# Patient Record
Sex: Female | Born: 1945 | Race: White | Hispanic: No | Marital: Single | Smoking: Current some day smoker
Health system: Southern US, Community
[De-identification: ages and names within clinical notes are randomized; demographics above are authoritative.]

## PROBLEM LIST (undated history)

## (undated) DIAGNOSIS — N289 Disorder of kidney and ureter, unspecified: Secondary | ICD-10-CM

## (undated) DIAGNOSIS — I4891 Unspecified atrial fibrillation: Secondary | ICD-10-CM

## (undated) DIAGNOSIS — T753XXA Motion sickness, initial encounter: Secondary | ICD-10-CM

## (undated) DIAGNOSIS — F0390 Unspecified dementia without behavioral disturbance: Secondary | ICD-10-CM

## (undated) DIAGNOSIS — F32A Depression, unspecified: Secondary | ICD-10-CM

## (undated) DIAGNOSIS — N179 Acute kidney failure, unspecified: Secondary | ICD-10-CM

## (undated) DIAGNOSIS — F329 Major depressive disorder, single episode, unspecified: Secondary | ICD-10-CM

## (undated) DIAGNOSIS — E7800 Pure hypercholesterolemia, unspecified: Secondary | ICD-10-CM

## (undated) DIAGNOSIS — F319 Bipolar disorder, unspecified: Secondary | ICD-10-CM

## (undated) DIAGNOSIS — R55 Syncope and collapse: Secondary | ICD-10-CM

## (undated) DIAGNOSIS — F419 Anxiety disorder, unspecified: Secondary | ICD-10-CM

## (undated) DIAGNOSIS — E119 Type 2 diabetes mellitus without complications: Secondary | ICD-10-CM

## (undated) DIAGNOSIS — R251 Tremor, unspecified: Secondary | ICD-10-CM

## (undated) DIAGNOSIS — G7000 Myasthenia gravis without (acute) exacerbation: Secondary | ICD-10-CM

## (undated) DIAGNOSIS — C439 Malignant melanoma of skin, unspecified: Secondary | ICD-10-CM

## (undated) DIAGNOSIS — J449 Chronic obstructive pulmonary disease, unspecified: Secondary | ICD-10-CM

## (undated) DIAGNOSIS — I519 Heart disease, unspecified: Secondary | ICD-10-CM

## (undated) DIAGNOSIS — G43909 Migraine, unspecified, not intractable, without status migrainosus: Secondary | ICD-10-CM

## (undated) DIAGNOSIS — I10 Essential (primary) hypertension: Secondary | ICD-10-CM

## (undated) DIAGNOSIS — M359 Systemic involvement of connective tissue, unspecified: Secondary | ICD-10-CM

## (undated) HISTORY — DX: Acute kidney failure, unspecified: N17.9

## (undated) HISTORY — PX: CHOLECYSTECTOMY: SHX55

## (undated) HISTORY — DX: Disorder of kidney and ureter, unspecified: N28.9

## (undated) HISTORY — DX: Unspecified atrial fibrillation: I48.91

## (undated) HISTORY — PX: OTHER SURGICAL HISTORY: SHX169

## (undated) HISTORY — DX: Anxiety disorder, unspecified: F41.9

## (undated) HISTORY — PX: CATARACT EXTRACTION: SUR2

## (undated) HISTORY — DX: Pure hypercholesterolemia, unspecified: E78.00

## (undated) HISTORY — DX: Unspecified dementia, unspecified severity, without behavioral disturbance, psychotic disturbance, mood disturbance, and anxiety: F03.90

## (undated) HISTORY — DX: Essential (primary) hypertension: I10

## (undated) HISTORY — DX: Major depressive disorder, single episode, unspecified: F32.9

## (undated) HISTORY — DX: Type 2 diabetes mellitus without complications: E11.9

## (undated) HISTORY — DX: Migraine, unspecified, not intractable, without status migrainosus: G43.909

## (undated) HISTORY — DX: Heart disease, unspecified: I51.9

## (undated) HISTORY — DX: Depression, unspecified: F32.A

## (undated) HISTORY — DX: Systemic involvement of connective tissue, unspecified: M35.9

## (undated) HISTORY — DX: Syncope and collapse: R55

## (undated) HISTORY — DX: Myasthenia gravis without (acute) exacerbation: G70.00

## (undated) HISTORY — DX: Malignant melanoma of skin, unspecified: C43.9

## (undated) HISTORY — PX: TUBAL LIGATION: SHX77

## (undated) HISTORY — DX: Bipolar disorder, unspecified: F31.9

## (undated) HISTORY — DX: Motion sickness, initial encounter: T75.3XXA

## (undated) HISTORY — DX: Tremor, unspecified: R25.1

## (undated) HISTORY — DX: Chronic obstructive pulmonary disease, unspecified: J44.9

---

## 2012-08-12 LAB — PULMONARY FUNCTION TEST

## 2013-07-31 ENCOUNTER — Emergency Department: Payer: Self-pay | Admitting: Emergency Medicine

## 2013-07-31 LAB — CBC
HGB: 13.2 g/dL (ref 12.0–16.0)
MCH: 30.4 pg (ref 26.0–34.0)
MCHC: 33.4 g/dL (ref 32.0–36.0)
RBC: 4.35 10*6/uL (ref 3.80–5.20)
RDW: 14.1 % (ref 11.5–14.5)
WBC: 8.9 10*3/uL (ref 3.6–11.0)

## 2013-07-31 LAB — COMPREHENSIVE METABOLIC PANEL
Albumin: 3.6 g/dL (ref 3.4–5.0)
Anion Gap: 4 — ABNORMAL LOW (ref 7–16)
BUN: 19 mg/dL — ABNORMAL HIGH (ref 7–18)
Calcium, Total: 9.9 mg/dL (ref 8.5–10.1)
Chloride: 112 mmol/L — ABNORMAL HIGH (ref 98–107)
Co2: 25 mmol/L (ref 21–32)
Creatinine: 1.15 mg/dL (ref 0.60–1.30)
Glucose: 80 mg/dL (ref 65–99)
Osmolality: 282 (ref 275–301)
Potassium: 3.4 mmol/L — ABNORMAL LOW (ref 3.5–5.1)
SGPT (ALT): 20 U/L (ref 12–78)
Total Protein: 6.5 g/dL (ref 6.4–8.2)

## 2013-07-31 LAB — URINALYSIS, COMPLETE
Bacteria: NONE SEEN
Bilirubin,UR: NEGATIVE
Blood: NEGATIVE
Hyaline Cast: 2
Ketone: NEGATIVE
Leukocyte Esterase: NEGATIVE
Ph: 5 (ref 4.5–8.0)
RBC,UR: 1 /HPF (ref 0–5)
Specific Gravity: 1.02 (ref 1.003–1.030)
WBC UR: 1 /HPF (ref 0–5)

## 2013-07-31 LAB — LIPASE, BLOOD: Lipase: 272 U/L (ref 73–393)

## 2013-07-31 LAB — TROPONIN I: Troponin-I: <0.02 ng/mL

## 2013-08-13 LAB — DRUG SCREEN, URINE
Amphetamines, Ur Screen: NEGATIVE (ref ?–1000)
Benzodiazepine, Ur Scrn: NEGATIVE (ref ?–200)
Cannabinoid 50 Ng, Ur ~~LOC~~: NEGATIVE (ref ?–50)
Cocaine Metabolite,Ur ~~LOC~~: NEGATIVE (ref ?–300)
MDMA (Ecstasy)Ur Screen: NEGATIVE (ref ?–500)
Phencyclidine (PCP) Ur S: NEGATIVE (ref ?–25)
Tricyclic, Ur Screen: NEGATIVE (ref ?–1000)

## 2013-08-13 LAB — URINALYSIS, COMPLETE
Bacteria: NONE SEEN
Bilirubin,UR: NEGATIVE
Blood: NEGATIVE
Glucose,UR: 150 mg/dL (ref 0–75)
Ketone: NEGATIVE
Nitrite: NEGATIVE
Ph: 6 (ref 4.5–8.0)
Squamous Epithelial: 3

## 2013-08-13 LAB — COMPREHENSIVE METABOLIC PANEL
Anion Gap: 10 (ref 7–16)
BUN: 16 mg/dL (ref 7–18)
Bilirubin,Total: 0.4 mg/dL (ref 0.2–1.0)
Chloride: 107 mmol/L (ref 98–107)
Co2: 23 mmol/L (ref 21–32)
Creatinine: 1.09 mg/dL (ref 0.60–1.30)
EGFR (Non-African Amer.): 52 — ABNORMAL LOW
Osmolality: 290 (ref 275–301)
Potassium: 3.7 mmol/L (ref 3.5–5.1)
SGOT(AST): 21 U/L (ref 15–37)
SGPT (ALT): 30 U/L (ref 12–78)
Total Protein: 6.2 g/dL — ABNORMAL LOW (ref 6.4–8.2)

## 2013-08-13 LAB — CBC
HGB: 13.3 g/dL (ref 12.0–16.0)
MCH: 29.8 pg (ref 26.0–34.0)
MCHC: 32.5 g/dL (ref 32.0–36.0)
RBC: 4.46 10*6/uL (ref 3.80–5.20)
RDW: 14.1 % (ref 11.5–14.5)
WBC: 10.4 10*3/uL (ref 3.6–11.0)

## 2013-08-13 LAB — ETHANOL
Ethanol %: <0.003 % (ref 0.000–0.080)
Ethanol: <3 mg/dL

## 2013-08-13 LAB — SALICYLATE LEVEL: Salicylates, Serum: <1.7 mg/dL

## 2013-08-13 LAB — TSH: Thyroid Stimulating Horm: 0.38 u[IU]/mL — ABNORMAL LOW

## 2013-08-14 ENCOUNTER — Inpatient Hospital Stay: Payer: Self-pay | Admitting: Psychiatry

## 2013-08-21 LAB — LIPID PANEL
HDL Cholesterol: 32 mg/dL — ABNORMAL LOW (ref 40–60)
Ldl Cholesterol, Calc: 129 mg/dL — ABNORMAL HIGH (ref 0–100)
Triglycerides: 199 mg/dL (ref 0–200)

## 2013-08-21 LAB — HEMOGLOBIN A1C: Hemoglobin A1C: 6.1 % (ref 4.2–6.3)

## 2013-10-30 ENCOUNTER — Emergency Department: Payer: Self-pay | Admitting: Emergency Medicine

## 2013-10-30 LAB — CBC WITH DIFFERENTIAL/PLATELET
BASOS PCT: 0.6 %
Basophil #: 0 10*3/uL (ref 0.0–0.1)
EOS ABS: 0.1 10*3/uL (ref 0.0–0.7)
EOS PCT: 0.9 %
HCT: 35.9 % (ref 35.0–47.0)
HGB: 12.1 g/dL (ref 12.0–16.0)
Lymphocyte #: 0.8 10*3/uL — ABNORMAL LOW (ref 1.0–3.6)
Lymphocyte %: 10.9 %
MCH: 31.8 pg (ref 26.0–34.0)
MCHC: 33.6 g/dL (ref 32.0–36.0)
MCV: 95 fL (ref 80–100)
MONOS PCT: 6.3 %
Monocyte #: 0.5 x10 3/mm (ref 0.2–0.9)
Neutrophil #: 6.3 10*3/uL (ref 1.4–6.5)
Neutrophil %: 81.3 %
Platelet: 200 10*3/uL (ref 150–440)
RBC: 3.8 10*6/uL (ref 3.80–5.20)
RDW: 14.8 % — ABNORMAL HIGH (ref 11.5–14.5)
WBC: 7.8 10*3/uL (ref 3.6–11.0)

## 2013-10-30 LAB — BASIC METABOLIC PANEL
Anion Gap: 6 — ABNORMAL LOW (ref 7–16)
BUN: 23 mg/dL — ABNORMAL HIGH (ref 7–18)
CO2: 24 mmol/L (ref 21–32)
Calcium, Total: 8.9 mg/dL (ref 8.5–10.1)
Chloride: 110 mmol/L — ABNORMAL HIGH (ref 98–107)
Creatinine: 1.03 mg/dL (ref 0.60–1.30)
EGFR (African American): >60
EGFR (Non-African Amer.): 55 — ABNORMAL LOW
GLUCOSE: 108 mg/dL — AB (ref 65–99)
Osmolality: 284 (ref 275–301)
Potassium: 3.4 mmol/L — ABNORMAL LOW (ref 3.5–5.1)
Sodium: 140 mmol/L (ref 136–145)

## 2013-10-30 LAB — TROPONIN I

## 2013-10-31 LAB — URINALYSIS, COMPLETE
Bacteria: NONE SEEN
Bilirubin,UR: NEGATIVE
Blood: NEGATIVE
Glucose,UR: NEGATIVE mg/dL (ref 0–75)
Ketone: NEGATIVE
Leukocyte Esterase: NEGATIVE
NITRITE: NEGATIVE
Ph: 6 (ref 4.5–8.0)
Protein: NEGATIVE
RBC,UR: 1 /HPF (ref 0–5)
SPECIFIC GRAVITY: 1.013 (ref 1.003–1.030)
Squamous Epithelial: 1
WBC UR: 2 /HPF (ref 0–5)

## 2013-11-20 ENCOUNTER — Inpatient Hospital Stay: Payer: Self-pay | Admitting: Internal Medicine

## 2013-11-20 LAB — CBC WITH DIFFERENTIAL/PLATELET
BASOS PCT: 0.3 %
Basophil #: 0 10*3/uL (ref 0.0–0.1)
EOS ABS: 0 10*3/uL (ref 0.0–0.7)
EOS PCT: 0 %
HCT: 36.7 % (ref 35.0–47.0)
HGB: 12.2 g/dL (ref 12.0–16.0)
Lymphocyte #: 0.7 10*3/uL — ABNORMAL LOW (ref 1.0–3.6)
Lymphocyte %: 5.5 %
MCH: 31.5 pg (ref 26.0–34.0)
MCHC: 33.3 g/dL (ref 32.0–36.0)
MCV: 95 fL (ref 80–100)
MONO ABS: 0.6 x10 3/mm (ref 0.2–0.9)
MONOS PCT: 4.7 %
Neutrophil #: 11.5 10*3/uL — ABNORMAL HIGH (ref 1.4–6.5)
Neutrophil %: 89.5 %
PLATELETS: 225 10*3/uL (ref 150–440)
RBC: 3.86 10*6/uL (ref 3.80–5.20)
RDW: 15.1 % — AB (ref 11.5–14.5)
WBC: 12.9 10*3/uL — AB (ref 3.6–11.0)

## 2013-11-20 LAB — COMPREHENSIVE METABOLIC PANEL
Albumin: 3.4 g/dL (ref 3.4–5.0)
Alkaline Phosphatase: 242 U/L — ABNORMAL HIGH
Anion Gap: 2 — ABNORMAL LOW (ref 7–16)
BUN: 20 mg/dL — ABNORMAL HIGH (ref 7–18)
Bilirubin,Total: 0.4 mg/dL (ref 0.2–1.0)
Calcium, Total: 9 mg/dL (ref 8.5–10.1)
Chloride: 113 mmol/L — ABNORMAL HIGH (ref 98–107)
Co2: 29 mmol/L (ref 21–32)
Creatinine: 1.03 mg/dL (ref 0.60–1.30)
EGFR (African American): >60
EGFR (Non-African Amer.): 55 — ABNORMAL LOW
Glucose: 52 mg/dL — ABNORMAL LOW (ref 65–99)
Osmolality: 287 (ref 275–301)
Potassium: 3 mmol/L — ABNORMAL LOW (ref 3.5–5.1)
SGOT(AST): 50 U/L — ABNORMAL HIGH (ref 15–37)
SGPT (ALT): 81 U/L — ABNORMAL HIGH (ref 12–78)
Sodium: 144 mmol/L (ref 136–145)
Total Protein: 6.4 g/dL (ref 6.4–8.2)

## 2013-11-20 LAB — URINALYSIS, COMPLETE
Bacteria: NONE SEEN
Bilirubin,UR: NEGATIVE
Blood: NEGATIVE
Glucose,UR: NEGATIVE mg/dL (ref 0–75)
Ketone: NEGATIVE
Leukocyte Esterase: NEGATIVE
Nitrite: NEGATIVE
Ph: 7 (ref 4.5–8.0)
Protein: NEGATIVE
RBC,UR: <1 /HPF (ref 0–5)
Specific Gravity: 1.006 (ref 1.003–1.030)
Squamous Epithelial: NONE SEEN
WBC UR: 2 /HPF (ref 0–5)

## 2013-11-20 LAB — TROPONIN I: Troponin-I: <0.02 ng/mL

## 2013-11-21 LAB — CBC WITH DIFFERENTIAL/PLATELET
BASOS PCT: 0.5 %
Basophil #: 0.1 10*3/uL (ref 0.0–0.1)
Eosinophil #: 0 10*3/uL (ref 0.0–0.7)
Eosinophil %: 0 %
HCT: 35.1 % (ref 35.0–47.0)
HGB: 11.6 g/dL — AB (ref 12.0–16.0)
LYMPHS PCT: 2.7 %
Lymphocyte #: 0.3 10*3/uL — ABNORMAL LOW (ref 1.0–3.6)
MCH: 31.2 pg (ref 26.0–34.0)
MCHC: 32.9 g/dL (ref 32.0–36.0)
MCV: 95 fL (ref 80–100)
Monocyte #: 0.3 x10 3/mm (ref 0.2–0.9)
Monocyte %: 2.3 %
NEUTROS ABS: 12.1 10*3/uL — AB (ref 1.4–6.5)
NEUTROS PCT: 94.5 %
PLATELETS: 222 10*3/uL (ref 150–440)
RBC: 3.71 10*6/uL — AB (ref 3.80–5.20)
RDW: 15.4 % — AB (ref 11.5–14.5)
WBC: 12.8 10*3/uL — AB (ref 3.6–11.0)

## 2013-11-21 LAB — BASIC METABOLIC PANEL
ANION GAP: 5 — AB (ref 7–16)
BUN: 22 mg/dL — ABNORMAL HIGH (ref 7–18)
CALCIUM: 8.3 mg/dL — AB (ref 8.5–10.1)
CHLORIDE: 113 mmol/L — AB (ref 98–107)
Co2: 24 mmol/L (ref 21–32)
Creatinine: 1.06 mg/dL (ref 0.60–1.30)
EGFR (African American): >60
GFR CALC NON AF AMER: 53 — AB
GLUCOSE: 160 mg/dL — AB (ref 65–99)
Osmolality: 290 (ref 275–301)
POTASSIUM: 4.2 mmol/L (ref 3.5–5.1)
Sodium: 142 mmol/L (ref 136–145)

## 2013-11-22 ENCOUNTER — Encounter: Payer: Self-pay | Admitting: Neurology

## 2013-11-22 ENCOUNTER — Other Ambulatory Visit: Payer: Self-pay | Admitting: Neurology

## 2013-11-22 ENCOUNTER — Ambulatory Visit (INDEPENDENT_AMBULATORY_CARE_PROVIDER_SITE_OTHER): Payer: Medicare Other | Admitting: Neurology

## 2013-11-22 VITALS — BP 160/90 | HR 114 | Ht 62.6 in | Wt 117.6 lb

## 2013-11-22 DIAGNOSIS — M216X9 Other acquired deformities of unspecified foot: Secondary | ICD-10-CM

## 2013-11-22 DIAGNOSIS — G7000 Myasthenia gravis without (acute) exacerbation: Secondary | ICD-10-CM | POA: Insufficient documentation

## 2013-11-22 DIAGNOSIS — M21372 Foot drop, left foot: Secondary | ICD-10-CM

## 2013-11-22 NOTE — Progress Notes (Signed)
[facility] Clinic Note - Initial Visit   Date: 11/22/2013    Mackenzie Rose MRN: [id number] DOB: August 18, 1946   Dear Dr [name]:  Thank you for your kind referral of Mackenzie Rose for consultation of myasthenia gravis. Although her history is well known to you, please allow [place] to reiterate it for the purpose of our medical record. The patient was accompanied to the clinic by caregiver who also provides collateral information.     History of Present Illness: Mackenzie Rose is a 68 y.o. left-handed Caucasian female with history of myasthenia gravis (diagnosed 2009), hypertension, COPD, dementia, and bipolar depression presenting for evaluation of myasthenia gravis.    She developed bilateral ptosis and double vision in ~2006 and was eventually referred to neurology ([name] in [place], [place] with [facility]) in 2009 where she was diagnosed with myasthenia gravis.  She was initially started on prednisone which seems to keep her symptoms stable.  I unfortunately do not have any of these clinical notes to review, but she tells me that her prednisone was being tapered last.  She moved to [facility] in September 2015 to be closer to her sister and currently resides in an assisted living facility.  She saw Dr. [name] in January 2015 for MG who referred her to see me.  Currently, she reports seeing double vision, rare spells of difficulty swallowing solids, and having problems with hand dexterity.  She has been on prednisone 40mg  QOD and mestinon 90mg  TID since fall of 2014.  Her last steroid adjustment was tapering it the dose.  She has never been hospitalized with myasthenia gravis exacerbation, developed respiratory insufficiency, or received IVIG/plasmapheresis.  She has not tried medications other than prednisone and mestinon.  She does not recall her highest dose of prednisone.  Her symptoms are all worsened with stress and if she is calm, it is  much better.    Of note, she has left foot drop which started in the fall of 2014.  She had previously had imaging for this and is having PT, but I am not sure what has been done.    Out-side paper records, electronic medical record, and images have been reviewed where available and summarized as:  CT head without contrast 07/31/2013 (per report, no images to review):  Regions of chronic infarction.  Mild involutional and chronic changes.  No evidence of acute abnormalities.   Labs 08/13/2013:  Glucose 262, Cr 1.09, Na 140 K 3.7  AST 21  ALT 30  Past Medical History  Diagnosis Date  . Heart disease   . Atrial fibrillation   . Hypertension   . High cholesterol   . Diabetes     borderline  . COPD (chronic obstructive pulmonary disease)   . Melanoma   . Kidney disorder   . Tremor   . Autoimmune disease   . Depressed   . Anxiety disorder   . Migraine   . Dementia   . Motion sickness     Past Surgical History  Procedure Laterality Date  . Tubal ligation    . Retena repair    . Cataract extraction       Medications:  Mestinon 90 TID Prednisone 40mg  QOD Seroquel 300mg  Aricept 5mg  Buspirone 5mg  TID Advair BID Lamictal 100mg  BID Cozaar 50mg  BID Pravastatin 20mg  daily Tramadol 50mg  q6h prn Trazadone 50mg  qhs   Allergies:  Allergies  Allergen Reactions  . Lithium     Family History: Family History  Problem  Relation Age of Onset  . Heart disease Father   . Other Mother     ruptured hernia  . Breast cancer Sister   . Heart disease Sister     Social History: History   Social History  . Marital Status: Unknown    Spouse Name: N/A    Number of Children: 1  . Years of Education: N/A   Occupational History  . retired    Social History Main Topics  . Smoking status: Current Every Day Smoker  . Smokeless tobacco: Not on file  . Alcohol Use: Yes  . Drug Use: Not on file  . Sexual Activity: Not on file   Other Topics Concern  . Not on file   Social  History Narrative  . No narrative on file    Review of Systems:  CONSTITUTIONAL: No fevers, chills, night sweats, or weight loss.   EYES: +visual changes or eye pain ENT: No hearing changes.  No history of nose bleeds.   RESPIRATORY: No cough, wheezing and shortness of breath.   CARDIOVASCULAR: Negative for chest pain, and palpitations.   GI: Negative for abdominal discomfort, blood in stools or black stools.  No recent change in bowel habits.   GU:  No history of incontinence.   MUSCLOSKELETAL: No history of joint pain or swelling.  No myalgias.   SKIN: Negative for lesions, rash, and itching.   HEMATOLOGY/ONCOLOGY: Negative for prolonged bleeding, bruising easily, and swollen nodes.     ENDOCRINE: Negative for cold or heat intolerance, polydipsia or goiter.   PSYCH:  +depression or anxiety symptoms.   NEURO: As Above.   Vital Signs:  BP 160/90  Pulse 114  Ht 5' 2.6" (1.59 m)  Wt 117 lb 9 oz (53.326 kg)  BMI 21.09 kg/m2  SpO2 95%   General Medical Exam:   General:  She is wearing dark sunglasses, depressed-appearing, disheveled in her gown, comfortable.   Eyes/ENT: see cranial nerve examination.   Neck: No masses appreciated.  Full range of motion without tenderness.   Respiratory:  Clear to auscultation, good air entry bilaterally.  She is able to count to 30 on deep inhalation. Cardiac:  Regular rate and rhythm, no murmur.      Extremities:  Flail left foot.   Skin:  Skin color, texture, turgor normal. No rashes or lesions.  Neurological Exam: MENTAL STATUS including orientation to time, place, person, recent and remote memory, attention span and concentration, language, and fund of knowledge is normal.  Speech is not dysarthric.  CRANIAL NERVES: II:  No visual field defects.  Unremarkable fundi.   III-IV-VI: Pupils equal round and reactive to light.  Incomplete lateral gaze bilaterally with inability to bury eye into lateral canthus, but otherwise normal conjugate,  extra-ocular eye movements in all directions of gaze.  No nystagmus.  Mild bilateral ptosis at baseline without worsening with sustained upward gaze.   V:  Normal facial sensation.     VII:  Normal facial symmetry and movements.  Frontalis, oribicularis oculi, and orbicularis oris 5/5.  Buccinator is 5-/5 VIII:  Normal hearing and vestibular function.   IX-X:  Normal palatal movement.   XI:  Normal shoulder shrug and head rotation.   XII:  Normal tongue strength and range of motion, no deviation or fasciculation.    MOTOR:  Left TA atrophy, no fasciculations or abnormal movements.  No pronator drift.    Right Upper Extremity:    Left Upper Extremity:    Deltoid  5/5  Deltoid  5/5   Biceps  5/5   Biceps  5/5   Triceps  5/5   Triceps  5/5   Wrist extensors  5/5   Wrist extensors  5/5   Wrist flexors  5/5   Wrist flexors  5/5   Finger extensors  5/5   Finger extensors  5/5   Finger flexors  5/5   Finger flexors  5/5   Dorsal interossei  5/5   Dorsal interossei  5/5   Abductor pollicis  5/5   Abductor pollicis  5/5   Tone (Ashworth scale)  0  Tone (Ashworth scale)  0   Right Lower Extremity:    Left Lower Extremity:    Hip flexors  5/5   Hip flexors  5/5   Hip extensors  5/5   Hip extensors  5/5   Knee flexors  5/5   Knee flexors  5/5   Knee extensors  5/5   Knee extensors  5/5   Dorsiflexors  5/5   Dorsiflexors  2/5   Plantarflexors  5/5   Plantarflexors  2/5   Toe extensors  5/5   Toe extensors  2/5   Toe flexors  5/5   Toe flexors  2/5   Tone (Ashworth scale)  0  Tone (Ashworth scale)  0   MSRs:  Right                                                                 Left brachioradialis 2+  brachioradialis 2+  biceps 2+  biceps 2+  triceps 2+  triceps 2+  patellar 3+  patellar 3+  ankle jerk 0  ankle jerk 0  Hoffman no  Hoffman no  plantar response down  plantar response down   SENSORY:  Normal and symmetric perception of light touch, pinprick, vibration, and proprioception.   Romberg's sign absent.   COORDINATION/GAIT: Normal finger-to- nose-finger and heel-to-shin.  Intact rapid alternating movements bilaterally.  Able to rise from a chair without using arms.  High steppage gait on left, otherwise gait appears narrow-based and stable.   IMPRESSION: 1. Myasthenia gravis without exacerbation  Diagnosed in 2009 and manifested with diplopia and ptosis.  I am unsure of her antibody status.  No evidence of fatigability on exam today.  There is only mild ophthalmoplegia and facial weakness.  Since this is my first evaluation of the patient and I do not feel that her MG is worsening, I would like to keep her on her current medication regimen and try to obtain her records from Dr. Herby Abraham office to better understand her disease course.  If, indeed, she has been on greater than prednisone 20mg /day for some time, I think she may benefit from steroid-sparing agent.  I discussed long-term side effects of steroids and need for ongoing monitoring for diabetes, cataracts, osteoporosis, etc.  I have emphasized the importance of distinguishing true weakness from MG vs. generalized weakness from stress/infection because they are managed differently 2.  Left flail foot  ?L5-S1 radiculopathy  Will try to get records to see her previous work-up  She is doing PT   PLAN/RECOMMENDATIONS:  1.  Continue prednisone 40mg  every other day 2.  Continue mestinon 90mg  three times daily 3.  Encouraged patient to stay cool especially  in warmer temperatures as this can aggravate MG  4.  We will request records from Dr. Herby Abraham office 5.  Check MG panel 6.  Return to clinic 85-months   The duration of this appointment visit was 60 minutes of face-to-face time with the patient.  Greater than 50% of this time was spent in counseling, explanation of diagnosis, planning of further management, and coordination of care.   Thank you for allowing me to participate in patient's care.  If I can  answer any additional questions, I would be pleased to do so.    Sincerely,    Mackenzie Neuroth K. Posey Pronto, DO

## 2013-11-22 NOTE — Patient Instructions (Addendum)
1.  Continue prednisone 40mg  every other day 2.  Continue mestinon 90mg  three times daily 3.  Stay cool especially in warmer temperatures 4.  We will request records from Dr. Dola Argyle 5.  Check blood work 6.  Return to clinic 54-months

## 2013-11-23 NOTE — Progress Notes (Signed)
Notes faxed and request for records sent.

## 2013-11-24 ENCOUNTER — Telehealth: Payer: Self-pay | Admitting: Neurology

## 2013-11-24 NOTE — Telephone Encounter (Signed)
Clinic notes from [facility] neurology associates received: Clinic note dated 02/15/2013: Medications at that time includes prednisone 40 mg every other day and Mestinon 90 mg 3 times daily. Exam shows bilateral ptosis which was old and chronic. Clinic note dated 06/15/2013: Exam did not demonstrate ptosis although she "persistently kept her eyes closed". There was a note of lumbar compression fracture and risks associated with chronic steroid use. Discussed steroid sparing agent. Clinic note dated 12/16/2012: Exam with ptosis otherwise motions intact. Medications were unchanged. 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 Medications included prednisone 40 mg every the day and Mestinon 63 times daily.  CT head without contrast 12/21/2012: Evidence of prior infarcts and white matter changes. No acute abnormalities MRI thoracic spine without contrast 04/21/2013: Recent or active compression fracture at L1 with retropulsed fragment and anterior indentation of the thecal sac. Multilevel degenerative disc disease seen in the thoracic spine without evidence of neural impingement. MRI cervical spine without contrast 11/24/2012: There is mild neural foramen stenosis bilaterally at C5-6, but no central spinal stenosis. 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  Based on the above clinic note, I am not sure if she has had an EMG. She has been on chronic steroids for at least one year, but the lowest dose being prednisone 30 mg every other day. I will wait to see what her antibody status is and may consider EMG going forward.    Donika K. Posey Pronto, DO

## 2013-11-29 LAB — MYASTHENIA GRAVIS PANEL 2
Acetylcholine Rec Binding: <0.3 nmol/L
Acetylcholine Rec Mod Ab: 8 %
Aceytlcholine Rec Bloc Ab: <15 % of inhibition (ref <15)

## 2013-12-09 ENCOUNTER — Observation Stay: Payer: Self-pay | Admitting: Student

## 2013-12-09 LAB — CBC
HCT: 35.7 % (ref 35.0–47.0)
HGB: 11.7 g/dL — ABNORMAL LOW (ref 12.0–16.0)
MCH: 31.6 pg (ref 26.0–34.0)
MCHC: 32.9 g/dL (ref 32.0–36.0)
MCV: 96 fL (ref 80–100)
PLATELETS: 225 10*3/uL (ref 150–440)
RBC: 3.72 10*6/uL — AB (ref 3.80–5.20)
RDW: 14.9 % — ABNORMAL HIGH (ref 11.5–14.5)
WBC: 9.8 10*3/uL (ref 3.6–11.0)

## 2013-12-09 LAB — URINALYSIS, COMPLETE
BILIRUBIN, UR: NEGATIVE
BLOOD: NEGATIVE
Ketone: NEGATIVE
Nitrite: POSITIVE
Ph: 6 (ref 4.5–8.0)
Protein: 30
RBC, UR: NONE SEEN /HPF (ref 0–5)
SPECIFIC GRAVITY: 1.011 (ref 1.003–1.030)

## 2013-12-09 LAB — COMPREHENSIVE METABOLIC PANEL
ALBUMIN: 3.3 g/dL — AB (ref 3.4–5.0)
ANION GAP: 5 — AB (ref 7–16)
Alkaline Phosphatase: 119 U/L — ABNORMAL HIGH
BUN: 31 mg/dL — ABNORMAL HIGH (ref 7–18)
Bilirubin,Total: 0.2 mg/dL (ref 0.2–1.0)
CHLORIDE: 112 mmol/L — AB (ref 98–107)
CREATININE: 1.24 mg/dL (ref 0.60–1.30)
Calcium, Total: 8.5 mg/dL (ref 8.5–10.1)
Co2: 26 mmol/L (ref 21–32)
EGFR (African American): 51 — ABNORMAL LOW
GFR CALC NON AF AMER: 44 — AB
GLUCOSE: 88 mg/dL (ref 65–99)
Osmolality: 291 (ref 275–301)
POTASSIUM: 3.1 mmol/L — AB (ref 3.5–5.1)
SGOT(AST): 40 U/L — ABNORMAL HIGH (ref 15–37)
SGPT (ALT): 29 U/L (ref 12–78)
SODIUM: 143 mmol/L (ref 136–145)
Total Protein: 6.6 g/dL (ref 6.4–8.2)

## 2013-12-10 LAB — BASIC METABOLIC PANEL
ANION GAP: 5 — AB (ref 7–16)
BUN: 22 mg/dL — AB (ref 7–18)
CO2: 29 mmol/L (ref 21–32)
Calcium, Total: 8.4 mg/dL — ABNORMAL LOW (ref 8.5–10.1)
Chloride: 111 mmol/L — ABNORMAL HIGH (ref 98–107)
Creatinine: 1.12 mg/dL (ref 0.60–1.30)
EGFR (African American): 58 — ABNORMAL LOW
GFR CALC NON AF AMER: 50 — AB
GLUCOSE: 38 mg/dL — AB (ref 65–99)
Osmolality: 289 (ref 275–301)
POTASSIUM: 3.3 mmol/L — AB (ref 3.5–5.1)
SODIUM: 145 mmol/L (ref 136–145)

## 2013-12-10 LAB — GLUCOSE, RANDOM: Glucose: 73 mg/dL (ref 65–99)

## 2013-12-11 LAB — BASIC METABOLIC PANEL
Anion Gap: 4 — ABNORMAL LOW (ref 7–16)
BUN: 26 mg/dL — ABNORMAL HIGH (ref 7–18)
CO2: 28 mmol/L (ref 21–32)
Calcium, Total: 8.3 mg/dL — ABNORMAL LOW (ref 8.5–10.1)
Chloride: 111 mmol/L — ABNORMAL HIGH (ref 98–107)
Creatinine: 1.22 mg/dL (ref 0.60–1.30)
EGFR (African American): 52 — ABNORMAL LOW
EGFR (Non-African Amer.): 45 — ABNORMAL LOW
Glucose: 175 mg/dL — ABNORMAL HIGH (ref 65–99)
OSMOLALITY: 294 (ref 275–301)
Potassium: 3.9 mmol/L (ref 3.5–5.1)
Sodium: 143 mmol/L (ref 136–145)

## 2014-01-31 ENCOUNTER — Emergency Department: Payer: Self-pay | Admitting: Emergency Medicine

## 2014-02-28 ENCOUNTER — Encounter: Payer: Self-pay | Admitting: Neurology

## 2014-02-28 ENCOUNTER — Ambulatory Visit (INDEPENDENT_AMBULATORY_CARE_PROVIDER_SITE_OTHER): Payer: Medicare Other | Admitting: Neurology

## 2014-02-28 VITALS — BP 148/80 | HR 92 | Resp 18 | Ht 63.39 in | Wt 113.6 lb

## 2014-02-28 DIAGNOSIS — M216X9 Other acquired deformities of unspecified foot: Secondary | ICD-10-CM

## 2014-02-28 DIAGNOSIS — M21372 Foot drop, left foot: Secondary | ICD-10-CM

## 2014-02-28 DIAGNOSIS — G7000 Myasthenia gravis without (acute) exacerbation: Secondary | ICD-10-CM

## 2014-02-28 NOTE — Progress Notes (Signed)
Follow-up Visit   Date: 02/28/2014    Mackenzie Rose MRN: [id number] DOB: 19-Sep-1945   Interim History: Mackenzie Rose is a 68 y.o. left-handed Caucasian female with history of myasthenia gravis (diagnosed 2009), hypertension, COPD, dementia, left flail foot, and bipolar depression returning to the clinic for follow-up of MG.  The patient was accompanied to the clinic by caregiver who also provides collateral information.    History of present illness: She developed bilateral ptosis and double vision in ~2006 and was eventually referred to neurology ([name] in [place], [place] with [facility]) in 2009 where she was diagnosed with myasthenia gravis. She was initially started on prednisone which seems to keep her symptoms stable. She has been on chronic steroids since 2013, but the lowest dose being prednisone 30 mg every other day.  She moved to [facility] in September 2015 to be closer to her sister and currently resides in an assisted living facility.   She saw Dr. [name] in January 2015 for MG who referred her to see me. At her initial visit, she complained of seeing double vision, rare spells of difficulty swallowing solids, and having problems with hand dexterity. She has been on prednisone 40mg  QOD and mestinon 90mg  TID since fall of 2014. Her last steroid adjustment was tapering it the dose. She has never been hospitalized with myasthenia gravis exacerbation, developed respiratory insufficiency, or received IVIG/plasmapheresis. She has not tried medications other than prednisone and mestinon. She does not recall her highest dose of prednisone. Her symptoms are all worsened with stress and if she is calm, it is much better.   Of note, she has left foot drop which started in the fall of 2014. She had previously had imaging for this and is having PT, but I am not sure what has been done.   - Follow-up 02/28/2014:  She reports having difficulty with double vision and  hand dexterity, which is worse when she is stressed.  Swallowing has improved.  Denies any limb weakness or shortness of breath.  She also complains of photosensitivity.  She is requesting to be part of any clinical trials for MG.   Medications:  Current Outpatient Prescriptions on File Prior to Visit  Medication Sig Dispense Refill  . acetaminophen (TYLENOL) 650 MG CR tablet Take 650 mg by mouth every 4 (four) hours as needed for pain.      . busPIRone (BUSPAR) 5 MG tablet Take 5 mg by mouth 3 (three) times daily.      . Fluticasone-Salmeterol (ADVAIR) 250-50 MCG/DOSE AEPB Inhale 1 puff into the lungs 2 (two) times daily.      Marland Kitchen lamoTRIgine (LAMICTAL) 100 MG tablet Take 100 mg by mouth 2 (two) times daily.      Marland Kitchen losartan (COZAAR) 50 MG tablet Take 50 mg by mouth 2 (two) times daily.      . Multiple Vitamin (MULTIVITAMIN) tablet Take 1 tablet by mouth daily.      . pravastatin (PRAVACHOL) 20 MG tablet Take 20 mg by mouth daily.      . predniSONE (DELTASONE) 20 MG tablet Take 20 mg by mouth. 2 tablets by mouth every other day.      . pyridostigmine (MESTINON) 60 MG tablet Take 60 mg by mouth 3 (three) times daily. Take 1 1/2 tablets by mouth q 8 hours.      Marland Kitchen QUEtiapine (SEROQUEL) 100 MG tablet Take 100 mg by mouth at bedtime.      Marland Kitchen QUEtiapine (SEROQUEL) 200  MG tablet Take 200 mg by mouth at bedtime.      . sodium chloride (OCEAN) 0.65 % SOLN nasal spray Place 2 sprays into both nostrils.      . traMADol (ULTRAM) 50 MG tablet Take 50 mg by mouth every 6 (six) hours as needed. Take 1/2 tablet q 6 hours prn      . traZODone (DESYREL) 50 MG tablet Take 50 mg by mouth at bedtime. Take 1/2 at bedtime.      Marland Kitchen ibuprofen (ADVIL,MOTRIN) 400 MG tablet Take 400 mg by mouth every 6 (six) hours as needed.       No current facility-administered medications on file prior to visit.    Allergies:  Allergies  Allergen Reactions  . Lithium      Review of Systems:  CONSTITUTIONAL: No fevers, chills,  night sweats, or weight loss.   EYES: No visual changes or eye pain ENT: No hearing changes.  No history of nose bleeds.   RESPIRATORY: No cough, wheezing and shortness of breath.   CARDIOVASCULAR: Negative for chest pain, and palpitations.   GI: Negative for abdominal discomfort, blood in stools or black stools.  No recent change in bowel habits.   GU:  No history of incontinence.   MUSCLOSKELETAL: No history of joint pain or swelling.  No myalgias.   SKIN: Negative for lesions, rash, and itching.   ENDOCRINE: Negative for cold or heat intolerance, polydipsia or goiter.   PSYCH:  + depression or anxiety symptoms.   NEURO: As Above.   Vital Signs:  BP 148/80  Pulse 92  Resp 18  Ht 5' 3.39" (1.61 m)  Wt 113 lb 9.6 oz (51.529 kg)  BMI 19.88 kg/m2  SpO2 97%   Neurological Exam: MENTAL STATUS including orientation to time, place, person, recent and remote memory, attention span and concentration, language, and fund of knowledge is normal.  Speech is not dysarthric.  CRANIAL NERVES: No visual field defects.  Pupils equal round and reactive to light.  Normal conjugate, extra-ocular eye movements in all directions of gaze.  No ptosis with sustained upward gaze for 30 seconds.  Facial muscles are intact - orbicularis oculi, buccinators, frontalis, orbicularis oris is 5/5. Normal facial sensation.  Face is symmetric. Palate elevates symmetrically.  Tongue is midline.  MOTOR:  Neck flexion and extension is 5/5.  Motor strength is 5/5 in all extremities, except left dorsiflexion 2/5, plantar flexion 5/5, toe extension 2/5, and toe flexion 2/5.  Tone is normal.   She is able to count to 40 on deep inhalation.   MSRs:  Right      Left  brachioradialis  2+   brachioradialis  2+   biceps  2+   biceps  2+   triceps  2+   triceps  2+   patellar  3+   patellar  3+   ankle jerk  0   ankle jerk  0   Hoffman  no   Hoffman  no   plantar response  down   plantar response  down    SENSORY: Normal  and symmetric perception of light touch and vibration.   COORDINATION/GAIT:  Normal finger-to- nose-finger.  Intact rapid alternating movements bilaterally. High steppage gait on left, otherwise gait appears narrow-based and stable.   Internal Data: Labs 11/22/2013:  MG panel - negative  External data:  CT head without contrast 12/21/2012: Evidence of prior infarcts and white matter changes. No acute abnormalities   MRI thoracic spine without contrast 04/21/2013:  Recent or active compression fracture at L1 with retropulsed fragment and anterior indentation of the thecal sac. Multilevel degenerative disc disease seen in the thoracic spine without evidence of neural impingement.   MRI cervical spine without contrast 11/24/2012: There is mild neural foramen stenosis bilaterally at C5-6, but no central spinal stenosis. 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   IMPRESSION: 1. Myasthenia gravis without exacerbation   - Diagnosed in 2009 and manifested with diplopia and ptosis.   - No evidence of fatigability or weakness exam today.  - There is an overlay of anxiety which is contributing to some of her symptoms, but based on exam today, I do not feel MG is worsening  - I have emphasized the importance of distinguishing true weakness from MG vs. generalized weakness from stress/infection because they are managed differently  - Risk of long term steroid use discussed and will consider steroid-sparing agent going forward if unable to reduce prednisone.    - With her subjective sensation of weakness, will hold of on tapering prednisone at this time, but at next visit, if there is no weakness, will either taper prednisone or start steroid-sparing agent.  - She is very keen on being involving in any clinical trials on MG so will refer her to an academic center  2. Left flail foot   - ?L5-S1 radiculopathy     PLAN/RECOMMENDATIONS:  Continue physical therapy Encouraged to use left AFO for foot drop Continue prednisone 40mg  every other day Continue mestinon 90mg  three times daily Referral to [facility] as patient is interested in clinical trials   The duration of this appointment visit was 30 minutes of face-to-face time with the patient.  Greater than 50% of this time was spent in counseling, explanation of diagnosis, planning of further management, and coordination of care.   Thank you for allowing me to participate in patient's care.  If I can answer any additional questions, I would be pleased to do so.    Sincerely,    Donika K. Posey Pronto, DO

## 2014-02-28 NOTE — Patient Instructions (Addendum)
Continue physical therapy Encouraged to use left AFO for foot drop Continue prednisone 40mg  every other day Continue mestinon 90mg  three times daily We will contact you regarding referral to an academic center since you areinterested in clinical trials Stay well hydrated and cool during warmer temperatures Return to clinic in 110-months

## 2014-03-01 NOTE — Progress Notes (Signed)
Notes and scans faxed to [facility] per Carol's request (referral coordinator).  Fax #: [phone]

## 2014-03-13 ENCOUNTER — Telehealth: Payer: Self-pay | Admitting: Neurology

## 2014-03-13 ENCOUNTER — Encounter: Payer: Self-pay | Admitting: *Deleted

## 2014-03-13 NOTE — Telephone Encounter (Signed)
FYI

## 2014-03-13 NOTE — Telephone Encounter (Signed)
Pt called to confirm that she indeed takes 2 tablets of  prednisone every other day  C/b [id number]

## 2014-03-13 NOTE — Telephone Encounter (Signed)
Please verify patient info.  Looks like there is no Epic notes on this patient.  Lyanna Blystone K. Posey Pronto, DO

## 2014-06-01 ENCOUNTER — Ambulatory Visit: Payer: Medicare Other | Admitting: Neurology

## 2014-07-14 ENCOUNTER — Ambulatory Visit (INDEPENDENT_AMBULATORY_CARE_PROVIDER_SITE_OTHER): Payer: Medicare Other | Admitting: Neurology

## 2014-07-14 ENCOUNTER — Encounter: Payer: Self-pay | Admitting: Neurology

## 2014-07-14 VITALS — BP 140/70 | HR 111 | Ht 63.0 in | Wt 125.6 lb

## 2014-07-14 DIAGNOSIS — Z79899 Other long term (current) drug therapy: Secondary | ICD-10-CM

## 2014-07-14 DIAGNOSIS — M21372 Foot drop, left foot: Secondary | ICD-10-CM

## 2014-07-14 DIAGNOSIS — G7000 Myasthenia gravis without (acute) exacerbation: Secondary | ICD-10-CM

## 2014-07-14 NOTE — Progress Notes (Signed)
Follow-up Visit   Date: 07/14/2014    Mackenzie Rose MRN: [id number] DOB: 1945/11/29   Interim History: Mackenzie Rose is a 68 y.o. left-handed Caucasian female with history of myasthenia gravis (diagnosed 2009), hypertension, COPD, dementia, left flail foot, and bipolar depression returning to the clinic for follow-up of MG.  The patient was accompanied to the clinic by caregiver who also provides collateral information.    History of present illness: She developed bilateral ptosis and double vision in ~2006 and was eventually referred to neurology ([name] in [place], [place] with [facility]) in 2009 where she was diagnosed with myasthenia gravis. She was initially started on prednisone which seems to keep her symptoms stable. She has been on chronic steroids since 2013, but the lowest dose being prednisone 30 mg every other day.  She moved to [facility] in September 2015 to be closer to her sister and currently resides in an assisted living facility.   She saw Dr. [name] in January 2015 for MG who referred her to see me. At her initial visit, she complained of seeing double vision, rare spells of difficulty swallowing solids, and having problems with hand dexterity. She has been on prednisone 40mg  QOD and mestinon 90mg  TID since fall of 2014. Her last steroid adjustment was tapering it the dose. She has never been hospitalized with myasthenia gravis exacerbation, developed respiratory insufficiency, or received IVIG/plasmapheresis. She has not tried medications other than prednisone and mestinon. She does not recall her highest dose of prednisone. Her symptoms are all worsened with stress and if she is calm, it is much better.   Of note, she has left foot drop which started in the fall of 2014. She had previously had imaging for this and is having PT, but I am not sure what has been done.   - Follow-up 02/28/2014:  She reports having difficulty with double vision and  hand dexterity, which is worse when she is stressed.  Swallowing has improved.  Denies any limb weakness or shortness of breath.  She also complains of photosensitivity.  She is requesting to be part of any clinical trials for MG.  - UPDATE 07/14/2014:  She is feeling better because her vision and walking is improved.  Denies any neck weakness, shortness of breath.  Appointment at [facility] was scheduled, but patient was a no show.   Medications:  Current Outpatient Prescriptions on File Prior to Visit  Medication Sig Dispense Refill  . acetaminophen (TYLENOL) 650 MG CR tablet Take 650 mg by mouth every 4 (four) hours as needed for pain.    Marland Kitchen alendronate (FOSAMAX) 70 MG tablet Take 70 mg by mouth once a week. Take with a full glass of water on an empty stomach.    . busPIRone (BUSPAR) 5 MG tablet Take 5 mg by mouth 3 (three) times daily.    Marland Kitchen donepezil (ARICEPT) 10 MG tablet Take 10 mg by mouth at bedtime.    . Fluticasone-Salmeterol (ADVAIR) 250-50 MCG/DOSE AEPB Inhale 1 puff into the lungs 2 (two) times daily.    Marland Kitchen ibuprofen (ADVIL,MOTRIN) 400 MG tablet Take 400 mg by mouth every 6 (six) hours as needed.    . lamoTRIgine (LAMICTAL) 100 MG tablet Take 100 mg by mouth 2 (two) times daily.    Marland Kitchen losartan (COZAAR) 50 MG tablet Take 50 mg by mouth 2 (two) times daily.    . Multiple Vitamin (MULTIVITAMIN) tablet Take 1 tablet by mouth daily.    Marland Kitchen  pravastatin (PRAVACHOL) 20 MG tablet Take 20 mg by mouth daily.    . predniSONE (DELTASONE) 20 MG tablet Take 20 mg by mouth. 2 tablets by mouth every other day.    . pyridostigmine (MESTINON) 60 MG tablet Take 60 mg by mouth 3 (three) times daily. Take 1 1/2 tablets by mouth q 8 hours.    Marland Kitchen QUEtiapine (SEROQUEL) 200 MG tablet Take 200 mg by mouth at bedtime.    . sodium chloride (OCEAN) 0.65 % SOLN nasal spray Place 2 sprays into both nostrils.    . traMADol (ULTRAM) 50 MG tablet Take 50 mg by mouth every 6 (six) hours as needed. Take 1/2  tablet q 6 hours prn    . traZODone (DESYREL) 50 MG tablet Take 50 mg by mouth at bedtime. Take 1/2 at bedtime.     No current facility-administered medications on file prior to visit.    Allergies:  Allergies  Allergen Reactions  . Lithium   . Penicillins Rash     Review of Systems:  CONSTITUTIONAL: No fevers, chills, night sweats, or weight loss.   EYES: No visual changes or eye pain ENT: No hearing changes.  No history of nose bleeds.   RESPIRATORY: No cough, wheezing and shortness of breath.   CARDIOVASCULAR: Negative for chest pain, and palpitations.   GI: Negative for abdominal discomfort, blood in stools or black stools.  No recent change in bowel habits.   GU:  No history of incontinence.   MUSCLOSKELETAL: No history of joint pain or swelling.  No myalgias.   SKIN: Negative for lesions, rash, and itching.   ENDOCRINE: Negative for cold or heat intolerance, polydipsia or goiter.   PSYCH:  + depression or anxiety symptoms.   NEURO: As Above.   Vital Signs:  BP 140/70 mmHg  Pulse 111  Ht 5\' 3"  (1.6 m)  Wt 125 lb 9 oz (56.955 kg)  BMI 22.25 kg/m2  SpO2 96%   Neurological Exam: MENTAL STATUS including orientation to time, place, person, recent and remote memory, attention span and concentration, language, and fund of knowledge is normal. She is slightly anxious appearing. Speech is not dysarthric.  CRANIAL NERVES:  Pupils equal round and reactive to light.  Normal conjugate, extra-ocular eye movements in all directions of gaze.  No ptosis with sustained upward.  Facial muscles are intact - orbicularis oculi, buccinators, frontalis, orbicularis oris is 5/5. Normal facial sensation.  Face is symmetric. Palate elevates symmetrically.  Tongue strength is intact.  MOTOR:  Neck flexion and extension is 5/5.  Motor strength is 5/5 in all extremities, except left dorsiflexion 3+/5, plantar flexion 5/5, toe extension 3+/5, and toe flexion 3+/5.    MSRs:  Right      Left    brachioradialis  2+   brachioradialis  2+   biceps  2+   biceps  2+   triceps  2+   triceps  2+   patellar  3+   patellar  3+   ankle jerk  0   ankle jerk  0    SENSORY: Normal and symmetric perception of light touch and vibration.   COORDINATION/GAIT:   Intact rapid alternating movements bilaterally. High steppage gait on left, otherwise gait appears narrow-based and stable.   Internal Data: Labs 11/22/2013:  MG panel - negative  External data:  CT head without contrast 12/21/2012: Evidence of prior infarcts and white matter changes. No acute abnormalities   MRI thoracic spine without contrast 04/21/2013: Recent or active compression  fracture at L1 with retropulsed fragment and anterior indentation of the thecal sac. Multilevel degenerative disc disease seen in the thoracic spine without evidence of neural impingement.   MRI cervical spine without contrast 11/24/2012: There is mild neural foramen stenosis bilaterally at C5-6, but no central spinal stenosis. 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   IMPRESSION: 1. Myasthenia gravis without exacerbation   - Diagnosed in 2009 and manifested with diplopia and ptosis.   - No evidence of fatigability or weakness exam today.  - There is an overlay of anxiety, which seems to be improved so it is an ideal time to wean her prednisone  - Risk of long term steroid use discussed and will consider steroid-sparing agent going forward if unable to reduce prednisone.    2. Left flail foot, improving   - ?L5-S1 radiculopathy    PLAN/RECOMMENDATIONS:  Reduce prednisone to 35mg  every other day Reduce mestinon 60mg  three times daily Return to clinic in 56-months or sooner as needed   The duration of this appointment visit was 25 minutes of face-to-face time with the patient.  Greater than 50% of this time was spent in counseling, explanation of  diagnosis, planning of further management, and coordination of care.   Thank you for allowing me to participate in patient's care.  If I can answer any additional questions, I would be pleased to do so.    Sincerely,    Savien Mamula K. Posey Pronto, DO

## 2014-07-14 NOTE — Patient Instructions (Addendum)
1.  Reduce prednisone to 35mg  daily every other day  2.  Take mestinon 60mg  three times daily 3.  Return to clinic in 26-months

## 2014-08-08 ENCOUNTER — Telehealth: Payer: Self-pay | Admitting: Neurology

## 2014-08-08 NOTE — Telephone Encounter (Signed)
11/20/recev'd records request from [facility].by fax. Forwarded 08/08/14 via inter-office mail to HIM at [facility] for processing / [facility]

## 2014-09-22 ENCOUNTER — Encounter: Payer: Self-pay | Admitting: *Deleted

## 2014-09-22 ENCOUNTER — Ambulatory Visit (INDEPENDENT_AMBULATORY_CARE_PROVIDER_SITE_OTHER): Payer: Medicare Other | Admitting: Neurology

## 2014-09-22 ENCOUNTER — Encounter: Payer: Self-pay | Admitting: Neurology

## 2014-09-22 VITALS — BP 130/80 | HR 80 | Ht 63.0 in | Wt 130.2 lb

## 2014-09-22 DIAGNOSIS — G7000 Myasthenia gravis without (acute) exacerbation: Secondary | ICD-10-CM

## 2014-09-22 DIAGNOSIS — G5621 Lesion of ulnar nerve, right upper limb: Secondary | ICD-10-CM

## 2014-09-22 DIAGNOSIS — Z79899 Other long term (current) drug therapy: Secondary | ICD-10-CM

## 2014-09-22 DIAGNOSIS — M21372 Foot drop, left foot: Secondary | ICD-10-CM

## 2014-09-22 NOTE — Patient Instructions (Addendum)
1.  Reduce prednisone to 30 mg every other day 2.  Continue mestinon 60mg  three times daily 3.  Start using soft elbow pad and avoid over flexing at the elbow 4.  Return to clinic in 37-months

## 2014-09-22 NOTE — Progress Notes (Signed)
Follow-up Visit   Date: 09/22/2014    Viriginia Rose MRN: [id number] DOB: 09-25-1945   Interim History: Mackenzie Rose is a 69 y.o. left-handed Caucasian female with history of myasthenia gravis (diagnosed 2009), hypertension, COPD, dementia, left flail foot, and bipolar depression returning to the clinic for follow-up of MG.  The patient was accompanied to the clinic by caregiver who also provides collateral information.    History of present illness: She developed bilateral ptosis and double vision in ~2006 and was eventually referred to neurology ([name] in [place], [place] with [name]) in 2009 where she was diagnosed with myasthenia gravis. She was initially started on prednisone which seems to keep her symptoms stable. She has been on chronic steroids since 2013, but the lowest dose being prednisone 30 mg every other day.  She moved to [facility] in September 2015 to be closer to her sister and currently resides in an assisted living facility.   She saw Dr. [name] in January 2015 for MG who referred her to see me. At her initial visit, she complained of seeing double vision, rare spells of difficulty swallowing solids, and having problems with hand dexterity. She has been on prednisone 40mg  QOD and mestinon 90mg  TID since fall of 2014. Her last steroid adjustment was tapering it the dose. She has never been hospitalized with myasthenia gravis exacerbation, developed respiratory insufficiency, or received IVIG/plasmapheresis. She has not tried medications other than prednisone and mestinon. She does not recall her highest dose of prednisone. Her symptoms are all worsened with stress and if she is calm, it is much better.   Of note, she has left foot drop which started in the fall of 2014. She had previously had imaging for this and is having PT, but I am not sure what has been done.   - Follow-up 02/28/2014:  She reports having difficulty with double vision and  hand dexterity, which is worse when she is stressed.  Swallowing has improved.  Denies any limb weakness or shortness of breath.  She also complains of photosensitivity.  She is requesting to be part of any clinical trials for MG.  - Follow-up 07/14/2014:  She is feeling better because her vision and walking is improved.  Denies any neck weakness, shortness of breath.  Appointment at [place] was scheduled, but patient was a no show.  - UPDATE 09/22/2014:  She comes with a notebook of complaints:  She complains of right hand numbness over the ulnar distribution.  Her vision is improved and she denies any double vision. Neck and handwriting is also doing much better.  She complains of photosensitivity and teeth problems.  Swallowing is good, except occasional problems with pills.     Medications:  Current Outpatient Prescriptions on File Prior to Visit  Medication Sig Dispense Refill  . acetaminophen (TYLENOL) 650 MG CR tablet Take 650 mg by mouth every 4 (four) hours as needed for pain.    Marland Kitchen alendronate (FOSAMAX) 70 MG tablet Take 70 mg by mouth once a week. Take with a full glass of water on an empty stomach.    Marland Kitchen amLODipine (NORVASC) 5 MG tablet Take 5 mg by mouth daily.    . busPIRone (BUSPAR) 5 MG tablet Take 5 mg by mouth 3 (three) times daily.    Marland Kitchen donepezil (ARICEPT) 10 MG tablet Take 10 mg by mouth at bedtime.    . Fluticasone-Salmeterol (ADVAIR) 250-50 MCG/DOSE AEPB Inhale 1 puff into the lungs 2 (two)  times daily.    Marland Kitchen ibuprofen (ADVIL,MOTRIN) 400 MG tablet Take 400 mg by mouth every 6 (six) hours as needed.    . lamoTRIgine (LAMICTAL) 100 MG tablet Take 100 mg by mouth 2 (two) times daily.    Marland Kitchen losartan (COZAAR) 50 MG tablet Take 50 mg by mouth 2 (two) times daily.    . Multiple Vitamin (MULTIVITAMIN) tablet Take 1 tablet by mouth daily.    . pravastatin (PRAVACHOL) 20 MG tablet Take 20 mg by mouth daily.    . predniSONE (DELTASONE) 20 MG tablet Take 20 mg by mouth. 2  tablets by mouth every other day.    . pyridostigmine (MESTINON) 60 MG tablet Take 60 mg by mouth 3 (three) times daily. Take 1 1/2 tablets by mouth q 8 hours.    Marland Kitchen QUEtiapine (SEROQUEL) 200 MG tablet Take 200 mg by mouth at bedtime.    . sodium chloride (OCEAN) 0.65 % SOLN nasal spray Place 2 sprays into both nostrils.    . traMADol (ULTRAM) 50 MG tablet Take 50 mg by mouth every 6 (six) hours as needed. Take 1/2 tablet q 6 hours prn    . traZODone (DESYREL) 50 MG tablet Take 50 mg by mouth at bedtime. Take 1/2 at bedtime.     No current facility-administered medications on file prior to visit.    Allergies:  Allergies  Allergen Reactions  . Lithium   . Penicillins Rash     Review of Systems:  CONSTITUTIONAL: No fevers, chills, night sweats, or weight loss.   EYES: No visual changes or eye pain ENT: No hearing changes.  No history of nose bleeds.   RESPIRATORY: No cough, wheezing and shortness of breath.   CARDIOVASCULAR: Negative for chest pain, and palpitations.   GI: Negative for abdominal discomfort, blood in stools or black stools.  No recent change in bowel habits.   GU:  No history of incontinence.   MUSCLOSKELETAL: No history of joint pain or swelling.  No myalgias.   SKIN: Negative for lesions, rash, and itching.   ENDOCRINE: Negative for cold or heat intolerance, polydipsia or goiter.   PSYCH:  + depression or anxiety symptoms.   NEURO: As Above.   Vital Signs:  BP 130/80 mmHg  Pulse 80  Ht 5\' 3"  (1.6 m)  Wt 130 lb 3 oz (59.053 kg)  BMI 23.07 kg/m2  SpO2 93%   Neurological Exam: MENTAL STATUS including orientation to time, place, person, recent and remote memory, attention span and concentration, language, and fund of knowledge is normal. She is slightly anxious appearing. Speech is not dysarthric.  CRANIAL NERVES:  Pupils equal round and reactive to light.  Normal conjugate, extra-ocular eye movements in all directions of gaze.  No ptosis with sustained  upward.  Facial muscles are intact - orbicularis oculi, buccinators, frontalis, orbicularis oris is 5/5. Normal facial sensation.  Face is symmetric. Palate elevates symmetrically.  Tongue strength is intact.  MOTOR:  Neck flexion and extension is 5/5.  Motor strength is 5/5 in all extremities, except right ADM 5-/5, left dorsiflexion 3+/5, plantar flexion 5/5, toe extension 3+/5, and toe flexion 3+/5.    MSRs:  Right      Left  brachioradialis  2+   brachioradialis  2+   biceps  2+   biceps  2+   triceps  2+   triceps  2+   patellar  3+   patellar  3+   ankle jerk  0   ankle jerk  0    SENSORY: Normal and symmetric perception of light touch and vibration.  Negative Tinel's at the right elbow.  COORDINATION/GAIT:   Mild high steppage gait on left, otherwise gait appears narrow-based and stable.  Gait tested unassisted.   Internal Data: Labs 11/22/2013:  MG panel - negative  External data:  CT head without contrast 12/21/2012: Evidence of prior infarcts and white matter changes. No acute abnormalities   MRI thoracic spine without contrast 04/21/2013: Recent or active compression fracture at L1 with retropulsed fragment and anterior indentation of the thecal sac. Multilevel degenerative disc disease seen in the thoracic spine without evidence of neural impingement.   MRI cervical spine without contrast 11/24/2012: There is mild neural foramen stenosis bilaterally at C5-6, but no central spinal stenosis. 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   IMPRESSION: 1. Myasthenia gravis without exacerbation   - Diagnosed in 2009 and manifested with diplopia and ptosis.   - No evidence of fatigability or weakness exam today, despite tapering her prednisone to 35mg  qod at her last visit  - Since she is doing well, I will continue slow taper with her prednisone to 30mg  qod  - Risk of long term steroid use  discussed and will consider steroid-sparing agent going forward if unable to reduce prednisone.    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  Left flail foot, improving   - ?L5-S1 radiculopathy   4.   Bipolar depression, followed by psychiatry   PLAN/RECOMMENDATIONS:  Reduce prednisone to 30mg  every other day Reduce mestinon 60mg  three times daily Start using soft elbow bad and avoid hyperflexion at the elbow  Avoid repetitive trauma/compression to the right eblow Return to clinic in 79-months or sooner as needed   The duration of this appointment visit was 25 minutes of face-to-face time with the patient.  Greater than 50% of this time was spent in counseling, explanation of diagnosis, planning of further management, and coordination of care.   Thank you for allowing me to participate in patient's care.  If I can answer any additional questions, I would be pleased to do so.    Sincerely,    Baldemar Dady K. Posey Pronto, DO

## 2014-09-22 NOTE — Progress Notes (Signed)
Note faxed.

## 2014-10-26 ENCOUNTER — Observation Stay: Payer: Self-pay | Admitting: Internal Medicine

## 2014-12-06 LAB — PROTIME-INR
INR: 1.1
PROTHROMBIN TIME: 14.2 s

## 2014-12-06 LAB — CBC WITH DIFFERENTIAL/PLATELET
BASOS ABS: 0 10*3/uL (ref 0.0–0.1)
Basophil %: 0.3 %
EOS PCT: 0 %
Eosinophil #: 0 10*3/uL (ref 0.0–0.7)
HCT: 32.6 % — ABNORMAL LOW (ref 35.0–47.0)
HGB: 10.1 g/dL — ABNORMAL LOW (ref 12.0–16.0)
Lymphocyte #: 0.1 10*3/uL — ABNORMAL LOW (ref 1.0–3.6)
Lymphocyte %: 0.6 %
MCH: 23.5 pg — ABNORMAL LOW (ref 26.0–34.0)
MCHC: 30.9 g/dL — ABNORMAL LOW (ref 32.0–36.0)
MCV: 76 fL — ABNORMAL LOW (ref 80–100)
MONOS PCT: 1.3 %
Monocyte #: 0.2 x10 3/mm (ref 0.2–0.9)
NEUTROS PCT: 97.8 %
Neutrophil #: 15.4 10*3/uL — ABNORMAL HIGH (ref 1.4–6.5)
Platelet: 159 10*3/uL (ref 150–440)
RBC: 4.28 10*6/uL (ref 3.80–5.20)
RDW: 17.8 % — ABNORMAL HIGH (ref 11.5–14.5)
WBC: 15.8 10*3/uL — AB (ref 3.6–11.0)

## 2014-12-06 LAB — COMPREHENSIVE METABOLIC PANEL
ANION GAP: 9 (ref 7–16)
Albumin: 3.3 g/dL — ABNORMAL LOW
Alkaline Phosphatase: 67 U/L
BUN: 20 mg/dL
Bilirubin,Total: 0.6 mg/dL
CALCIUM: 8.9 mg/dL
CO2: 27 mmol/L
Chloride: 105 mmol/L
Creatinine: 1.51 mg/dL — ABNORMAL HIGH
EGFR (African American): 40 — ABNORMAL LOW
EGFR (Non-African Amer.): 34 — ABNORMAL LOW
Glucose: 115 mg/dL — ABNORMAL HIGH
POTASSIUM: 3.2 mmol/L — AB
SGOT(AST): 26 U/L
SGPT (ALT): 11 U/L — ABNORMAL LOW
SODIUM: 141 mmol/L
Total Protein: 6.1 g/dL — ABNORMAL LOW

## 2014-12-06 LAB — MAGNESIUM: MAGNESIUM: 1.8 mg/dL

## 2014-12-06 LAB — LACTIC ACID, PLASMA: LACTIC ACID, VENOUS: 2 mmol/L

## 2014-12-06 LAB — PHOSPHORUS: Phosphorus: 2.6 mg/dL

## 2014-12-06 LAB — TROPONIN I: Troponin-I: 0.12 ng/mL — ABNORMAL HIGH

## 2014-12-07 ENCOUNTER — Inpatient Hospital Stay
Admit: 2014-12-07 | Disposition: A | Discharge status: Home / Self Care | Payer: Self-pay | Attending: Internal Medicine | Admitting: Internal Medicine

## 2014-12-07 ENCOUNTER — Other Ambulatory Visit: Payer: Self-pay | Admitting: Physician Assistant

## 2014-12-07 ENCOUNTER — Encounter: Payer: Self-pay | Admitting: Physician Assistant

## 2014-12-07 DIAGNOSIS — R7989 Other specified abnormal findings of blood chemistry: Secondary | ICD-10-CM

## 2014-12-07 DIAGNOSIS — I340 Nonrheumatic mitral (valve) insufficiency: Secondary | ICD-10-CM | POA: Diagnosis not present

## 2014-12-07 DIAGNOSIS — I10 Essential (primary) hypertension: Secondary | ICD-10-CM

## 2014-12-07 DIAGNOSIS — F0390 Unspecified dementia without behavioral disturbance: Secondary | ICD-10-CM | POA: Insufficient documentation

## 2014-12-07 DIAGNOSIS — F319 Bipolar disorder, unspecified: Secondary | ICD-10-CM | POA: Insufficient documentation

## 2014-12-07 LAB — URINALYSIS, COMPLETE
BILIRUBIN, UR: NEGATIVE
KETONE: NEGATIVE
Leukocyte Esterase: NEGATIVE
Nitrite: NEGATIVE
Ph: 6 (ref 4.5–8.0)
Protein: 100
SPECIFIC GRAVITY: 1.009 (ref 1.003–1.030)
SQUAMOUS EPITHELIAL: NONE SEEN

## 2014-12-07 LAB — TROPONIN I
TROPONIN-I: 0.23 ng/mL — AB
Troponin-I: 0.21 ng/mL — ABNORMAL HIGH

## 2014-12-07 LAB — CLOSTRIDIUM DIFFICILE(ARMC)

## 2014-12-08 LAB — BASIC METABOLIC PANEL
ANION GAP: 7 (ref 7–16)
BUN: 48 mg/dL — ABNORMAL HIGH
CO2: 21 mmol/L — AB
CREATININE: 2.08 mg/dL — AB
Calcium, Total: 7.5 mg/dL — ABNORMAL LOW
Chloride: 111 mmol/L
GFR CALC AF AMER: 27 — AB
GFR CALC NON AF AMER: 23 — AB
Glucose: 197 mg/dL — ABNORMAL HIGH
Potassium: 4 mmol/L
SODIUM: 139 mmol/L

## 2014-12-08 LAB — CBC WITH DIFFERENTIAL/PLATELET
BASOS ABS: 0 10*3/uL (ref 0.0–0.1)
BASOS PCT: 0.4 %
Eosinophil #: 0 10*3/uL (ref 0.0–0.7)
Eosinophil %: 0 %
HCT: 23.9 % — AB (ref 35.0–47.0)
HGB: 7 g/dL — AB (ref 12.0–16.0)
LYMPHS PCT: 1.2 %
Lymphocyte #: 0.2 10*3/uL — ABNORMAL LOW (ref 1.0–3.6)
MCH: 22.7 pg — ABNORMAL LOW (ref 26.0–34.0)
MCHC: 29.2 g/dL — ABNORMAL LOW (ref 32.0–36.0)
MCV: 78 fL — ABNORMAL LOW (ref 80–100)
Monocyte #: 0.3 x10 3/mm (ref 0.2–0.9)
Monocyte %: 2.5 %
Neutrophil #: 12.5 10*3/uL — ABNORMAL HIGH (ref 1.4–6.5)
Neutrophil %: 95.9 %
Platelet: 121 10*3/uL — ABNORMAL LOW (ref 150–440)
RBC: 3.06 10*6/uL — ABNORMAL LOW (ref 3.80–5.20)
RDW: 18.2 % — AB (ref 11.5–14.5)
WBC: 13 10*3/uL — ABNORMAL HIGH (ref 3.6–11.0)

## 2014-12-08 LAB — IRON AND TIBC
Iron Bind.Cap.(Total): 230 — ABNORMAL LOW (ref 250–450)
Iron Saturation: 5.6
Iron: 13 ug/dL — ABNORMAL LOW
Unbound Iron-Bind.Cap.: 217.4

## 2014-12-08 LAB — FERRITIN: Ferritin (ARMC): 130 ng/mL

## 2014-12-08 LAB — URINE CULTURE

## 2014-12-08 LAB — WBCS, STOOL

## 2014-12-09 DIAGNOSIS — R7989 Other specified abnormal findings of blood chemistry: Secondary | ICD-10-CM | POA: Diagnosis not present

## 2014-12-09 LAB — CBC WITH DIFFERENTIAL/PLATELET
BASOS ABS: 0 10*3/uL (ref 0.0–0.1)
Basophil %: 0.1 %
EOS ABS: 0.1 10*3/uL (ref 0.0–0.7)
Eosinophil %: 0.5 %
HCT: 29.5 % — ABNORMAL LOW (ref 35.0–47.0)
HGB: 9.3 g/dL — AB (ref 12.0–16.0)
Lymphocyte #: 0.3 10*3/uL — ABNORMAL LOW (ref 1.0–3.6)
Lymphocyte %: 1.9 %
MCH: 24.4 pg — AB (ref 26.0–34.0)
MCHC: 31.7 g/dL — AB (ref 32.0–36.0)
MCV: 77 fL — ABNORMAL LOW (ref 80–100)
MONO ABS: 0.3 x10 3/mm (ref 0.2–0.9)
Monocyte %: 2.4 %
NEUTROS PCT: 95.1 %
Neutrophil #: 13 10*3/uL — ABNORMAL HIGH (ref 1.4–6.5)
PLATELETS: 116 10*3/uL — AB (ref 150–440)
RBC: 3.83 10*6/uL (ref 3.80–5.20)
RDW: 17.3 % — AB (ref 11.5–14.5)
WBC: 13.7 10*3/uL — ABNORMAL HIGH (ref 3.6–11.0)

## 2014-12-09 LAB — BASIC METABOLIC PANEL
ANION GAP: 4 — AB (ref 7–16)
BUN: 45 mg/dL — ABNORMAL HIGH
CALCIUM: 7.9 mg/dL — AB
Chloride: 115 mmol/L — ABNORMAL HIGH
Co2: 22 mmol/L
Creatinine: 1.74 mg/dL — ABNORMAL HIGH
EGFR (African American): 34 — ABNORMAL LOW
EGFR (Non-African Amer.): 29 — ABNORMAL LOW
GLUCOSE: 122 mg/dL — AB
Potassium: 4 mmol/L
SODIUM: 141 mmol/L

## 2014-12-11 LAB — CULTURE, BLOOD (SINGLE)

## 2014-12-11 LAB — STOOL CULTURE

## 2014-12-12 ENCOUNTER — Inpatient Hospital Stay
Admit: 2014-12-12 | Disposition: A | Discharge status: Home / Self Care | Payer: Self-pay | Attending: Internal Medicine | Admitting: Internal Medicine

## 2014-12-12 DIAGNOSIS — F3160 Bipolar disorder, current episode mixed, unspecified: Secondary | ICD-10-CM | POA: Insufficient documentation

## 2014-12-12 DIAGNOSIS — E119 Type 2 diabetes mellitus without complications: Secondary | ICD-10-CM | POA: Insufficient documentation

## 2014-12-12 DIAGNOSIS — N289 Disorder of kidney and ureter, unspecified: Secondary | ICD-10-CM | POA: Insufficient documentation

## 2014-12-12 DIAGNOSIS — F419 Anxiety disorder, unspecified: Secondary | ICD-10-CM | POA: Insufficient documentation

## 2014-12-12 DIAGNOSIS — J449 Chronic obstructive pulmonary disease, unspecified: Secondary | ICD-10-CM | POA: Insufficient documentation

## 2014-12-12 DIAGNOSIS — I10 Essential (primary) hypertension: Secondary | ICD-10-CM | POA: Insufficient documentation

## 2014-12-12 LAB — HEPATIC FUNCTION PANEL A (ARMC)
ALBUMIN: 3 g/dL — AB
ALK PHOS: 76 U/L
ALT: 13 U/L — AB
AST: 21 U/L
Bilirubin, Direct: <0.1 mg/dL
Bilirubin,Total: 0.1 mg/dL — ABNORMAL LOW
TOTAL PROTEIN: 5.9 g/dL — AB

## 2014-12-12 LAB — BASIC METABOLIC PANEL WITH GFR
Anion Gap: 9
BUN: 21 mg/dL — ABNORMAL HIGH
Calcium, Total: 8.9 mg/dL
Chloride: 104 mmol/L
Co2: 28 mmol/L
Creatinine: 1.22 mg/dL — ABNORMAL HIGH
EGFR (African American): 52 — ABNORMAL LOW
EGFR (Non-African Amer.): 45 — ABNORMAL LOW
Glucose: 187 mg/dL — ABNORMAL HIGH
Potassium: 3.4 mmol/L — ABNORMAL LOW
Sodium: 141 mmol/L

## 2014-12-12 LAB — TROPONIN I: Troponin-I: 0.05 ng/mL — ABNORMAL HIGH

## 2014-12-12 LAB — DIFFERENTIAL
BANDS NEUTROPHIL: 1 %
LYMPHS PCT: 12 %
MONOS PCT: 1 %
Segmented Neutrophils: 86 %

## 2014-12-12 LAB — CBC
HCT: 33.2 % — ABNORMAL LOW
HGB: 10.4 g/dL — ABNORMAL LOW
MCH: 24.1 pg — ABNORMAL LOW
MCHC: 31.2 g/dL — ABNORMAL LOW
MCV: 77 fL — ABNORMAL LOW
Platelet: 172 x10 3/mm 3
RBC: 4.3 X10 6/mm 3
RDW: 18 % — ABNORMAL HIGH
WBC: 14.9 x10 3/mm 3 — ABNORMAL HIGH

## 2014-12-12 LAB — TSH: Thyroid Stimulating Horm: 0.871 u[IU]/mL

## 2014-12-13 LAB — CBC WITH DIFFERENTIAL/PLATELET
BASOS PCT: 0.3 %
Basophil #: 0 10*3/uL (ref 0.0–0.1)
EOS PCT: 0.1 %
Eosinophil #: 0 10*3/uL (ref 0.0–0.7)
HCT: 33.5 % — AB (ref 35.0–47.0)
HGB: 10.2 g/dL — AB (ref 12.0–16.0)
LYMPHS PCT: 4.4 %
Lymphocyte #: 0.7 10*3/uL — ABNORMAL LOW (ref 1.0–3.6)
MCH: 23.6 pg — ABNORMAL LOW (ref 26.0–34.0)
MCHC: 30.4 g/dL — ABNORMAL LOW (ref 32.0–36.0)
MCV: 78 fL — ABNORMAL LOW (ref 80–100)
MONOS PCT: 2.6 %
Monocyte #: 0.4 x10 3/mm (ref 0.2–0.9)
Neutrophil #: 13.8 10*3/uL — ABNORMAL HIGH (ref 1.4–6.5)
Neutrophil %: 92.6 %
Platelet: 170 10*3/uL (ref 150–440)
RBC: 4.31 10*6/uL (ref 3.80–5.20)
RDW: 18.2 % — AB (ref 11.5–14.5)
WBC: 14.9 10*3/uL — AB (ref 3.6–11.0)

## 2014-12-13 LAB — BASIC METABOLIC PANEL
ANION GAP: 8 (ref 7–16)
BUN: 16 mg/dL
CREATININE: 1.09 mg/dL — AB
Calcium, Total: 8.7 mg/dL — ABNORMAL LOW
Chloride: 105 mmol/L
Co2: 29 mmol/L
EGFR (Non-African Amer.): 51 — ABNORMAL LOW
GFR CALC AF AMER: 59 — AB
Glucose: 119 mg/dL — ABNORMAL HIGH
POTASSIUM: 3.6 mmol/L
SODIUM: 142 mmol/L

## 2014-12-13 LAB — URINALYSIS, COMPLETE
Bacteria: NONE SEEN
Bilirubin,UR: NEGATIVE
Blood: NEGATIVE
Glucose,UR: 50 mg/dL (ref 0–75)
Ketone: NEGATIVE
Leukocyte Esterase: NEGATIVE
NITRITE: NEGATIVE
Ph: 7 (ref 4.5–8.0)
Protein: NEGATIVE
RBC,UR: 1 /HPF (ref 0–5)
Specific Gravity: 1.006 (ref 1.003–1.030)
Squamous Epithelial: NONE SEEN
WBC UR: <1 /HPF (ref 0–5)

## 2014-12-14 LAB — BASIC METABOLIC PANEL
ANION GAP: 10 (ref 7–16)
BUN: 27 mg/dL — AB
Calcium, Total: 8.7 mg/dL — ABNORMAL LOW
Chloride: 99 mmol/L — ABNORMAL LOW
Co2: 27 mmol/L
Creatinine: 1.47 mg/dL — ABNORMAL HIGH
EGFR (African American): 41 — ABNORMAL LOW
GFR CALC NON AF AMER: 36 — AB
Glucose: 272 mg/dL — ABNORMAL HIGH
POTASSIUM: 3.9 mmol/L
Sodium: 136 mmol/L

## 2014-12-14 LAB — CBC WITH DIFFERENTIAL/PLATELET
Basophil #: 0 10*3/uL (ref 0.0–0.1)
Basophil %: 0.2 %
Eosinophil #: 0 10*3/uL (ref 0.0–0.7)
Eosinophil %: 0 %
HCT: 30.6 % — ABNORMAL LOW (ref 35.0–47.0)
HGB: 9.4 g/dL — AB (ref 12.0–16.0)
LYMPHS PCT: 3 %
Lymphocyte #: 0.3 10*3/uL — ABNORMAL LOW (ref 1.0–3.6)
MCH: 23.5 pg — ABNORMAL LOW (ref 26.0–34.0)
MCHC: 30.6 g/dL — ABNORMAL LOW (ref 32.0–36.0)
MCV: 77 fL — ABNORMAL LOW (ref 80–100)
Monocyte #: 0.2 x10 3/mm (ref 0.2–0.9)
Monocyte %: 1.8 %
NEUTROS PCT: 95 %
Neutrophil #: 10.5 10*3/uL — ABNORMAL HIGH (ref 1.4–6.5)
Platelet: 164 10*3/uL (ref 150–440)
RBC: 3.99 10*6/uL (ref 3.80–5.20)
RDW: 18 % — ABNORMAL HIGH (ref 11.5–14.5)
WBC: 11.1 10*3/uL — ABNORMAL HIGH (ref 3.6–11.0)

## 2014-12-14 LAB — URINE CULTURE

## 2014-12-15 LAB — CBC WITH DIFFERENTIAL/PLATELET
BASOS ABS: 0 10*3/uL (ref 0.0–0.1)
Basophil %: 0.2 %
EOS PCT: 0 %
Eosinophil #: 0 10*3/uL (ref 0.0–0.7)
HCT: 29.2 % — ABNORMAL LOW (ref 35.0–47.0)
HGB: 8.9 g/dL — ABNORMAL LOW (ref 12.0–16.0)
Lymphocyte #: 0.4 10*3/uL — ABNORMAL LOW (ref 1.0–3.6)
Lymphocyte %: 2.3 %
MCH: 23.4 pg — ABNORMAL LOW (ref 26.0–34.0)
MCHC: 30.4 g/dL — ABNORMAL LOW (ref 32.0–36.0)
MCV: 77 fL — ABNORMAL LOW (ref 80–100)
MONOS PCT: 1.8 %
Monocyte #: 0.3 x10 3/mm (ref 0.2–0.9)
NEUTROS PCT: 95.7 %
Neutrophil #: 15.9 10*3/uL — ABNORMAL HIGH (ref 1.4–6.5)
PLATELETS: 200 10*3/uL (ref 150–440)
RBC: 3.79 10*6/uL — AB (ref 3.80–5.20)
RDW: 18.2 % — ABNORMAL HIGH (ref 11.5–14.5)
WBC: 16.6 10*3/uL — ABNORMAL HIGH (ref 3.6–11.0)

## 2014-12-15 LAB — BASIC METABOLIC PANEL
ANION GAP: 6 — AB (ref 7–16)
BUN: 49 mg/dL — AB
CALCIUM: 8.9 mg/dL
Chloride: 103 mmol/L
Co2: 27 mmol/L
Creatinine: 1.42 mg/dL — ABNORMAL HIGH
EGFR (Non-African Amer.): 37 — ABNORMAL LOW
GFR CALC AF AMER: 43 — AB
GLUCOSE: 174 mg/dL — AB
Potassium: 4.5 mmol/L
Sodium: 136 mmol/L

## 2014-12-15 LAB — HEMOGLOBIN A1C: Hemoglobin A1C: 6.7 % — ABNORMAL HIGH

## 2014-12-16 LAB — BASIC METABOLIC PANEL
ANION GAP: 6 — AB (ref 7–16)
BUN: 52 mg/dL — ABNORMAL HIGH
CALCIUM: 8.7 mg/dL — AB
Chloride: 102 mmol/L
Co2: 27 mmol/L
Creatinine: 1.34 mg/dL — ABNORMAL HIGH
EGFR (African American): 46 — ABNORMAL LOW
GFR CALC NON AF AMER: 40 — AB
Glucose: 107 mg/dL — ABNORMAL HIGH
POTASSIUM: 4.8 mmol/L
Sodium: 135 mmol/L

## 2014-12-16 LAB — WBC: WBC: 14.2 10*3/uL — ABNORMAL HIGH (ref 3.6–11.0)

## 2014-12-17 LAB — BASIC METABOLIC PANEL
ANION GAP: 4 — AB (ref 7–16)
BUN: 46 mg/dL — ABNORMAL HIGH
CALCIUM: 8.6 mg/dL — AB
CO2: 28 mmol/L
Chloride: 106 mmol/L
Creatinine: 1.29 mg/dL — ABNORMAL HIGH
EGFR (African American): 48 — ABNORMAL LOW
EGFR (Non-African Amer.): 42 — ABNORMAL LOW
Glucose: 88 mg/dL
POTASSIUM: 4.6 mmol/L
Sodium: 138 mmol/L

## 2014-12-17 LAB — CBC WITH DIFFERENTIAL/PLATELET
BASOS ABS: 0 10*3/uL (ref 0.0–0.1)
Basophil %: 0.1 %
EOS ABS: 0 10*3/uL (ref 0.0–0.7)
Eosinophil %: 0 %
HCT: 30 % — ABNORMAL LOW (ref 35.0–47.0)
HGB: 9.1 g/dL — ABNORMAL LOW (ref 12.0–16.0)
LYMPHS PCT: 4.1 %
Lymphocyte #: 0.4 10*3/uL — ABNORMAL LOW (ref 1.0–3.6)
MCH: 23.8 pg — ABNORMAL LOW (ref 26.0–34.0)
MCHC: 30.5 g/dL — AB (ref 32.0–36.0)
MCV: 78 fL — AB (ref 80–100)
Monocyte #: 0.6 x10 3/mm (ref 0.2–0.9)
Monocyte %: 5.3 %
NEUTROS PCT: 90.5 %
Neutrophil #: 9.9 10*3/uL — ABNORMAL HIGH (ref 1.4–6.5)
PLATELETS: 192 10*3/uL (ref 150–440)
RBC: 3.83 10*6/uL (ref 3.80–5.20)
RDW: 18.6 % — AB (ref 11.5–14.5)
WBC: 10.9 10*3/uL (ref 3.6–11.0)

## 2014-12-17 LAB — CULTURE, BLOOD (SINGLE)

## 2014-12-26 ENCOUNTER — Ambulatory Visit: Payer: Medicare Other | Admitting: Neurology

## 2014-12-29 NOTE — Consult Note (Signed)
Referring Physician:  Gonzella Lex   Primary Care Physician:  Gonzella Lex [facility], [address] [place], [place], [place] [address], [place] [phone]  Reason for Consult: Admit Date: 12-Aug-2013  Chief Complaint: bipolar  Reason for Consult: myasthenia gravis   History of Present Illness: History of Present Illness:   69 yo RHD F presents to [facility] secondary to voluntary admission for bipolar.  Pt has known myasthenia gravis for the past 6 years diagnosed by Dr. Idolina Primer at [facility] and has been relatively stable on current Prednisone and Mestinon dosing.  She has never had to be intubated and denies any problems with breathing or swallowing.  She does report intermittent fatigue and diplopia but that has been doing well lately.  She does report that 3 days ago that she woke up and could not use her L leg as good.  No pain and no numbness reported.  ROS:  General weakness    HEENT no complaints    Lungs chest wall pain    Cardiac chest pain    GU no complaints    Musculoskeletal no complaints    Extremities no complaints    Skin no complaints    Neuro no complaints    Endocrine no complaints    Psych anxiety  depression    Past Medical/Surgical Hx:  COPD:   Anxiety:   Bi Polar:   Myasthenia Gravis:   Past Medical/ Surgical Hx:  Past Medical History as above   Past Surgical History as above   Home Medications: Medication Instructions Last Modified Date/Time  Proventil HFA CFC free 90 mcg/inh inhalation aerosol 2 puff(s) inhaled 4 times a day, As Needed - for Wheezing 07-Dec-14 19:37  losartan 50 mg oral tablet 1 tab(s) orally 2 times a day 07-Dec-14 19:37  pyridostigmine 60 mg oral tablet 1.5 tab(s) orally 3 times a day 07-Dec-14 19:37  traMADol 50 mg oral tablet 1 tab(s) orally 2 times a day, As Needed - for Pain 07-Dec-14 19:37  lamoTRIgine 100 mg oral tablet 1 tab(s) orally 2 times a day 07-Dec-14 19:37   Advair Diskus 250 mcg-50 mcg inhalation powder  inhaled , As Needed 07-Dec-14 19:37  predniSONE 20 mg oral tablet 2  orally every other day 07-Dec-14 19:37   Allergies:  Penicillin: Rash  Lactase: Pain  Lithium: Unknown  Allergies:  Allergies as above    Social/Family History: Employment Status: disabled  Lives With: alone  Living Arrangements: shelter  Social History: + tob, no EtOH, no illicits  Family History: n/c   Vital Signs: **Vital Signs.:   10-Dec-14 07:34  Vital Signs Type Routine  Temperature Temperature (F) 98  Celsius 36.6  Pulse Pulse 77  Respirations Respirations 20  Systolic BP Systolic BP 878  Diastolic BP (mmHg) Diastolic BP (mmHg) 69  Systolic BP Systolic BP 676  Diastolic BP (mmHg) Diastolic BP (mmHg) 76   Physical Exam: General: thin, NAD, poorly kept  HEENT: normocephalic, sclera nonicteric, oropharynx clear  Neck: supple, no JVD, no bruits  Chest: CTA B, no wheezing, good moveemnt  Cardiac: RRR, no murmurs, no edema, 2+ pulses  Extremities: no C/C/E, FROM   Neurologic Exam: Mental Status: alert and oriented x 3, normal speech and language, follows complex commands  Cranial Nerves: PERRLA, EOMI, nl VF with no fatiguability after 1 minute upgaze, face symmetric, tongue midline, shoulder shrug equal  Motor Exam: 5/5 B except 1/5 L dorsiflexion and eversion, normal tone, no  tremor, no atrophy; no fatigability  Deep Tendon Reflexes: 3+/4 B, downgoing plantars  Sensory Exam: slighlty decreased temp and pin along peroneal nerve distro  Coordination: FTN and HTS WNL   Lab Results:  Thyroid:  06-Dec-14 14:16   Thyroid Stimulating Hormone  0.38 (0.45-4.50 (International Unit)  ----------------------- Pregnant patients have  different reference  ranges for TSH:  - - - - - - - - - -  Pregnant, first trimetser:  0.36 - 2.50 uIU/mL)  Hepatic:  06-Dec-14 14:16   Bilirubin, Total 0.4  Alkaline Phosphatase 108 (45-117 NOTE: New Reference  Range 07/29/13)  SGPT (ALT) 30  SGOT (AST) 21  Total Protein, Serum  6.2  Albumin, Serum 3.4  General Ref:  06-Dec-14 14:16   Acetaminophen, Serum  5 (10-30 POTENTIALLY TOXIC:  > 200 mcg/mL  > 50 mcg/mL at 12 hr after  ingestion  > 300 mcg/mL at 4 hr after  ingestion)  Salicylates, Serum < 1.7 (0.0-2.8 Therapeutic 2.8-20.0 mg/dL Toxic >30.0 mg/dL)  Routine Chem:  06-Dec-14 14:16   Glucose, Serum  262  BUN 16  Creatinine (comp) 1.09  Sodium, Serum 140  Potassium, Serum 3.7  Chloride, Serum 107  CO2, Serum 23  Calcium (Total), Serum 9.4  Osmolality (calc) 290  eGFR (African American)  60  eGFR (Non-African American)  52 (eGFR values <30m/min/1.73 m2 may be an indication of chronic kidney disease (CKD). Calculated eGFR is useful in patients with stable renal function. The eGFR calculation will not be reliable in acutely ill patients when serum creatinine is changing rapidly. It is not useful in  patients on dialysis. The eGFR calculation may not be applicable to patients at the low and high extremes of body sizes, pregnant women, and vegetarians.)  Anion Gap 10  Ethanol, S. < 3  Ethanol % (comp) < 0.003 (Result(s) reported on 13 Aug 2013 at 03:04PM.)  Urine Drugs:  [id number]  Tricyclic Antidepressant, Ur Qual (comp) NEGATIVE (Result(s) reported on 13 Aug 2013 at 05:34PM.)  Amphetamines, Urine Qual. NEGATIVE  MDMA, Urine Qual. NEGATIVE  Cocaine Metabolite, Urine Qual. NEGATIVE  Opiate, Urine qual NEGATIVE  Phencyclidine, Urine Qual. NEGATIVE  Cannabinoid, Urine Qual. NEGATIVE  Barbiturates, Urine Qual. NEGATIVE  Benzodiazepine, Urine Qual. NEGATIVE (----------------- The URINE DRUG SCREEN provides only a preliminary, unconfirmed analytical test result and should not be used for non-medical  purposes.  Clinical consideration and professional judgment should be  applied to any positive drug screen result due to possible interfering substances.  A more  specific alternate chemical method must be used in order to obtain a confirmed analytical result.  Gas chromatography/mass spectrometry (GC/MS) is the preferred confirmatory method.)  Methadone, Urine Qual. NEGATIVE  Routine UA:  06-Dec-14 14:16   Color (UA) Yellow  Clarity (UA) Clear  Glucose (UA) 150 mg/dL  Bilirubin (UA) Negative  Ketones (UA) Negative  Specific Gravity (UA) 1.015  Blood (UA) Negative  pH (UA) 6.0  Protein (UA) 30 mg/dL  Nitrite (UA) Negative  Leukocyte Esterase (UA) Negative (Result(s) reported on 13 Aug 2013 at 02:56PM.)  RBC (UA) <1 /HPF  WBC (UA) 2 /HPF  Bacteria (UA) NONE SEEN  Epithelial Cells (UA) 3 /HPF  Mucous (UA) PRESENT  Hyaline Cast (UA) 2 /LPF (Result(s) reported on 13 Aug 2013 at 02:56PM.)  Routine Hem:  06-Dec-14 14:16   WBC (CBC) 10.4  RBC (CBC) 4.46  Hemoglobin (CBC) 13.3  Hematocrit (CBC) 40.9  Platelet Count (CBC) 188 (Result(s) reported on 13 Aug 2013 at 02:46PM.)  MCV 92  MCH 29.8  MCHC 32.5  RDW 14.1   Radiology Impression: Radiology Impression: CT of head personally reviewed by me and shows an 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   Impression/Recommendations: Recommendations:   prior notes reviewed by me reviewed by me   Myasthenia gravis-  this appears well controlled on pt current dosing of medication and would avoid potentiating medications 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 several times on exam which probably caused this and it should get better in the next few days to weeks 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 continue Prednisone 53m every other day continue Mestinon 975mTID would avoid Lithium in this pt ok to use newer antipsychotics to help control bipolar needs an AFO for L ankle daily PT/OT start ASA 8169maily for old strokes  will sign off, [facility]  Electronic Signatures: SmiJamison NeighborD)  (Signed 10-Dec-14 14:15)  Authored: REFERRING  PHYSICIAN, Primary Care Physician, Consult, History of Present Illness, Review of Systems, PAST MEDICAL/SURGICAL HISTORY, HOME MEDICATIONS, ALLERGIES, Social/Family History, NURSING VITAL SIGNS, Physical Exam-, LAB RESULTS, RADIOLOGY RESULTS, Recommendations   Last Updated: 10-Dec-14 14:15 by SmiJamison NeighborD)

## 2014-12-29 NOTE — Discharge Summary (Signed)
PATIENT NAME:  Mackenzie Rose, Mackenzie Rose MR#:  [id number] DATE OF BIRTH:  1946/01/23  DATE OF ADMISSION:  08/14/2013 DATE OF DISCHARGE: 09/06/2013   HOSPITAL COURSE: See dictated history and physical for details of admission. This 69 year old woman with a history of bipolar disorder and myasthenia gravis was admitted to the hospital with severe anxiety and depression, incoherent thinking, some agitation and poor self-care. The patient had recently moved from [place] and was in a chaotic social situation. She did not like the assisted living facility she had moved into. She was feeling frightened about it. In the hospital, she was treated for her bipolar disorder, although she has had positive benefits from lithium in the past, my consultation with neurology suggested that this would be fairly strongly contraindicated with her disease. Therefore, in addition to the Lamictal, we have started her on Seroquel. She is tolerating 200 mg of Seroquel at night. Her mood has improved significantly over the course of the hospital stay. She is no longer reporting being depressed. Her energy level is much better. She gets up out of bed and attends groups, gets dressed and takes care of herself. She is thinking much more clearly and rationally and able to hold a rational conversations and plan for the future. No acute side effects from medication. She has continued lamotrigine. At the time of discharge, she is not voicing any ideation to suggest acute dangerousness and is agreeable to the outpatient treatment plan. Medically, we have treated her myasthenia gravis with the help of neurology consultation. She has been continued on her every other day prednisone as well as her every 8 hour dose of pyridostigmine and this seems to be sufficient to maintain her physical functioning. Most of the time, she has been able to walk without a walker. No breathing problems. The patient's blood pressure has remained under good control. She had a  cold for a couple of days that was easily treated and she got over that. She has had a visit from her sister and finally after some negotiation had a visit with a group home. She reported that she liked it and at this point, will be discharged to follow up with psychiatric treatment in the community and stay at a group home locally.   DISCHARGE MEDICATIONS: Prednisone 40 mg every other day, ibuprofen 400 mg 1 tab every 6 hours as needed for pain, tramadol 25 mg every 6 hours as needed for pain, losartan 50 mg twice a day, Lamictal 100 mg twice a day, quetiapine 200 mg at night, Advair inhaler 250/50 mcg 1 puff twice a day, pyridostigmine 60 mg 1-1/2 tablets 3 times a day, (Dictation Anomaly) <<MISSsalineING TEXT>> nasal spray p.r.n. as needed for nasal congestion.   MENTAL STATUS EXAM AT DISCHARGE: Casually dressed, adequately groomed woman, looks her stated age. She is cooperative with the interview. Makes good eye contact. Psychomotor activity is still a little bit slow. Speech is normal rate, tone and volume. Affect is somewhat anxious today. She is able to discuss the fact that she is anxious about discharge, but still feels willing to proceed with it and is able to think through her anxiety. Thoughts are lucid without any loosening of associations or delusions. Denies auditory or visual hallucinations. Denies suicidal or homicidal ideation. Shows good insight and judgment. Normal intelligence. Alert and oriented x 4.   LABORATORY STUDIES: Admission labs included a drug screen was negative, TSH somewhat low at 0.38, alcohol level negative. Chemistry panel showed a low total protein at 6.2.  Normal albumin. Elevated glucose at 262. Hematology panel was normal. Urinalysis positive for protein, otherwise unremarkable. It did not appear to be infected. Acetaminophen low therapeutic at 5. Salicylates negative. EKG showed normal sinus rhythm with left axis deviation. Followup blood sugars during her hospital  course have largely remained in the mid 100s. Cholesterol was measured at 201, triglycerides 199, HDL low at 32, LDL high at 129. Hemoglobin A1c normal at 6.1.   DISPOSITION: She is being discharged to live at a group home. She will have followup arrangements with the psychiatrist and we will also try to get a neurology followup.   DIAGNOSIS, PRINCIPAL AND PRIMARY:  AXIS I: Bipolar disorder, most recent episode mixed.  SECONDARY DIAGNOSES:  AXIS I: Deferred.  AXIS II: Deferred.  AXIS III: High blood pressure, chronic obstructive pulmonary disease and myasthenia gravis.  AXIS IV: Severe from recent relocation and now having to move into a new living arrangement.  AXIS V: Functioning at time of discharge 55.   ____________________________ Gonzella Lex, MD jtc:aw D: 09/06/2013 12:36:47 ET T: 09/06/2013 12:51:32 ET JOB#: [id number]  cc: Gonzella Lex, MD, <Dictator> Gonzella Lex MD ELECTRONICALLY SIGNED 09/06/2013 14:14
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
## 2014-12-30 NOTE — H&P (Signed)
PATIENT NAME:  Mackenzie Rose, CRANDALL MR#:  [id number] DATE OF BIRTH:  [birth year]  DATE OF ADMISSION:  12/09/2013  PRIMARY CARE PHYSICIAN: Located at [facility], Dr. Brunetta Genera.    CHIEF COMPLAINT: Lethargy and hypoglycemia.   HISTORY OF PRESENT ILLNESS: This is a 69 year old female sent over from a skilled nursing facility due to lethargy, decreased responsiveness and noted to be hypoglycemic. The patient was recently hospitalized for very similar complaints about 2 weeks ago where she had some dizziness and tremors and was noted to be hypoglycemic. At that point, she was taken off her glimepiride.  Presently, she is not taking any oral hypoglycemics or any medications for her diabetes. The patient was noted to be lethargic and found to be hypoglycemic and sent over to the ER. She received some dextrose on the way here by EMS and her blood sugars improved. While in the ER, she had another hypoglycemic episode where her blood sugars drop to the 20s. She currently has been started on D5 drip and hospitalist services were contacted for further treatment and evaluation. The patient presently denies any systemic complaints, but she is a poor historian and is quite lethargic.   REVIEW OF SYSTEMS: Otherwise unobtainable given the patient's underlying dementia and bipolar disorder.   PAST MEDICAL HISTORY: Consistent with dementia, bipolar disorder, myasthenia gravis, COPD, hypertension, hyperlipidemia.   ALLERGIES:  LITHIUM, PENICILLIN AND LACTOSE.   SOCIAL HISTORY:  Does smoke about a pack and a half per day, has been smoking for the past 40 to 50 years. No alcohol abuse. No illicit drug abuse. Resides at a skilled nursing facility.   FAMILY HISTORY: Both mother and father are deceased. Father died from complications of coronary artery disease. Mother died postoperatively after a hernia operation.   CURRENT MEDICATIONS: Are as follows: Lamictal 100 mg b.i.d., losartan 50 mg b.i.d., multivitamin daily,  pyridostigmine 90 mg q.8 hours, Seroquel 200 mg at bedtime, Advair 250/50, 1 puff b.i.d.; Tylenol 650 q.4 hours as needed, tramadol 25 mg q.6 hours as needed, prednisone 40 mg every other day, ibuprofen 400 mg q.6 hours as needed, Aricept 5 mg at bedtime, buspirone 5 mg t.i.d., multivitamin daily.   PHYSICAL EXAMINATION: Presently is as follows: VITAL SIGNS:  Are noted to be, temperature 97.4, pulse 67, respirations 18, blood pressure 129/53, sats 99% on room air.  GENERAL: The patient is a lethargic-appearing female but in no apparent distress.  HEAD, EYES, EARS, NOSE, THROAT:  She is atraumatic, normocephalic. Extraocular muscles are intact. Pupils equal and reactive on to light. Sclerae anicteric. No conjunctival injection. No pharyngeal erythema.  NECK: Supple. There is no jugular venous distention. No bruits. No lymphadenopathy or thyromegaly.  HEART: Regular rate and rhythm. No murmurs. No rubs. No clicks.  LUNGS: Clear to auscultation bilaterally. No rales. No rhonchi. No wheezes.  ABDOMEN: Soft, flat, nontender, nondistended. Has good bowel sounds. No hepatosplenomegaly appreciated.  EXTREMITIES: No evidence of any cyanosis, clubbing or peripheral edema. Has +2 pedal and radial pulses bilaterally.  NEUROLOGIC: The patient is alert, awake and oriented x 1. Moves all extremities spontaneously, globally weak. No other focal motor or sensory deficits appreciated bilaterally.  SKIN: Moist and warm with no rashes appreciated.  LYMPHATIC: There is no cervical or axillary lymphadenopathy.   LABORATORY DATA: Showed a serum glucose of 90, BUN 31, creatinine 1.2, sodium 143, potassium 3.1, chloride 112, bicarb 26. The patient's LFTs are within normal limits. White cell count 9.8, hemoglobin 11.7, hematocrit 35.7, platelet count 225. Urinalysis shows  positive nitrites and trace leukocyte esterase with 13 white cells and 1+ bacteria. Chest x-ray shows no evidence of acute cardiopulmonary disease.    ASSESSMENT AND PLAN: This is a 69 year old female with history of bipolar disorder, hypertension, diabetes, history of myasthenia gravis, chronic obstructive pulmonary disease, dementia, chronic kidney disease  stage III, who presents to the hospital due to weakness, lethargy and altered mental status and found to be hypoglycemic.  1.  Altered mental status/lethargy. This is likely related to the hypoglycemia. The patient's mental status has improved after she has been started on a dextrose drip. We will continue to follow.  2.  Hypoglycemia. The exact etiology is unclear. The patient is a diabetic, but is not taking any meds. She was recently hospitalized for similar symptoms 2 weeks ago and taken off her glimepiride.  I am unclear why she is hypoglycemic. There is no evidence of sepsis. I will do sulfonylurea screen and an insulin C-peptide level. Continue D5 drip for now. Follow blood sugars. If her hypoglycemia persists, would consider an endocrinology consult.  3.  Bipolar disorder. Continue with her Seroquel and Lamictal.  4.  History of myasthenia gravis. Continue prednisone and pyridostigmine.  5.  Chronic obstructive pulmonary disease.  No acute exacerbation. Continue Spiriva.  6.  Dementia. Continue Aricept.  7.  Chronic kidney disease, stage III. The patient's creatinine is at baseline.   The patient is a FULL CODE.   Time spent is 50 minutes.    ____________________________ Belia Heman. Verdell Carmine, MD vjs:dmm D: 12/09/2013 20:41:02 ET T: 12/09/2013 21:23:26 ET JOB#: [id number]  cc: Belia Heman. Verdell Carmine, MD, <Dictator> [name] MD ELECTRONICALLY SIGNED 12/18/2013 18:45

## 2014-12-30 NOTE — Discharge Summary (Signed)
PATIENT NAME:  Mackenzie Rose, Mackenzie Rose MR#:  [id number] DATE OF BIRTH:  [birth year]  DATE OF ADMISSION:  11/20/2013 DATE OF DISCHARGE:  11/21/2013  For a detailed note, please take a look at the history and physical done on admission by Dr. Vianne Bulls.    DIAGNOSES AT DISCHARGE: As follows:  1. Hypoglycemia, now resolved.  2. History of dementia.  3. Myasthenia gravis.  4. Hypertension.  5. Hyperlipidemia.   DIET: The patient is being discharged on a low-sodium, low-fat, American Diabetic Association diet.   ACTIVITY: As tolerated.   FOLLOWUP: With primary care physician at the group home.   DISCHARGE MEDICATIONS: Prednisone 20 mg 2 tabs every other day, ibuprofen 400 mg 1 tab q.6 hours as needed, tramadol 50 mg 1/2 tab q.6 hours as needed, losartan 50 mg b.i.d., Lamictal 100 mg b.i.d., Seroquel 200 mg at bedtime, pyridostigmine 60 mg 1-1/2 tabs q.8 hours, trazodone 50 mg 1/2 tab at bedtime as needed, Pravachol 20 mg daily, Aricept 5 mg daily, multivitamin daily, buspirone 5 mg t.i.d., Tylenol 650 q.4 hours as needed, Advair 250/50 one puff b.i.d., sodium chloride nasal spray 2 sprays every 25 to 60 minutes as needed for congestion.   PERTINENT STUDIES DONE DURING THE HOSPITAL COURSE: As follows: The patient actually had no significant pertinent imaging studies.   HOSPITAL COURSE: This is a 69 year old female with medical problems as mentioned above. Presented to the hospital with an episode of feeling jittery and having dizziness and noted to be hypoglycemic.  1. Hypoglycemia. This was likely the cause of the patient's dizziness and tremors. The patient was noted to have a blood sugar of 41. She remained hypoglycemic. Therefore, was admitted to the hospital and started on a D5 drip. She had recently been started on glimepiride which was held. The patient has now been taken off the D5 drip, and her blood sugars have remained stable. She apparently used to be controlled with her diet. Therefore, I will  discontinue any oral hypoglycemics for now. Follow blood sugars closely. Still continue a carb-controlled diet.  2. History of myasthenia gravis. The patient will continue on high-dose prednisone and her pyridostigmine. She has a followup with a neurologist coming up in the next day or so.  3. Hypertension. The patient remained hemodynamically stable. She will continue her losartan.  4. Anxiety/depression. The patient was maintained on her buspirone, Seroquel and Lamictal. She will resume that.  5. Hyperlipidemia. The patient was maintained on her Pravachol. She will resume that.  6. Dementia. The patient was maintained on her Aricept, and she will also resume that upon discharge.   The patient is being discharged back to her assisted living.    TIME SPENT: 40 minutes.   ____________________________ Belia Heman. Verdell Carmine, MD vjs:gb D: 11/21/2013 16:09:26 ET T: 11/22/2013 05:17:46 ET JOB#: [id number]  cc: Belia Heman. Verdell Carmine, MD, <Dictator> [name] MD ELECTRONICALLY SIGNED 12/02/2013 20:21

## 2014-12-30 NOTE — H&P (Signed)
PATIENT NAME:  Mackenzie Rose, Mackenzie Rose MR#:  [id number] DATE OF BIRTH:  01/15/1946  DATE OF ADMISSION:  11/20/2013  PRIMARY CARE PHYSICIAN: Nonlocal.   EMERGENCY ROOM PHYSICIAN: Sheryl L. Benjaman Lobe, MD  CHIEF COMPLAINT:  Shaking, dizziness.   HISTORY OF PRESENT ILLNESS: A 69 year old female patient brought in by EMS because of weakness.  The patient states that she felt shaking this morning while sitting outside and the patient also dizzy.  Recently moved from [place]  ,. The patient was found to have blood sugar of 41 when she came and persistently sugars were like 55 and the third sugar was 100. The patient was not was aware that she was getting diabetic medication but she is on Amaryl 2 mg daily started 3 days ago.  Denies any other complaints. No chest pain. No trouble sleeping. No cough. No fever. Seen dr.Potter  > for her myasthenia gravis.    PAST MEDICAL HISTORY: Significant for history of COPD, arthritis pains,  myasthenia gravis, anxiety, hypertension.  MEDICATIONS:   1.  Aricept 1 mg daily.  2.  Lamictal 100 mg p.o. b.i.d. 3.  Advair Diskus 250/50 one puff b.i.d. 4.  Losartan 50 mg p.o. b.i.d.  5.  BuSpar 5 mg p.o. t.i.d.  6.  Pyridostigmine 60 mg take 1-1/2 tablet every 8 hours.  7.  Prednisone 20 mg 2 tablets every other day.  8.  Seroquel 200 mg at bedtime.   9.  Trazodone 50 mg 1/2 tablet at bedtime as needed.  10.  Ibuprofen 400 mg every 6 hours as needed for pain.  11.  Glimepiride 2 mg, takes 1 daily. The patient has been started on this since the 12th of this month, the patient received 4 doses including today. 12.  She is also on pravastatin 20 mg p.o. daily.   ALLERGIES: LITHIUM, INSULIN AND LACTOSE.   SOCIAL HISTORY: Smokes about 1 pack per day. No alcohol. No drugs.   PAST SURGICAL HISTORY: Significant for tubal ligation, history of melanoma surgery.   REVIEW OF SYSTEMS:  CONSTITUTIONAL: The patient has no fever, no fatigue.  EYES: No blurred vision. No inflammation.   ENT: No tinnitus. No ear pain. No epistaxis, no  oropharyngela eythrema RESPIRATIONS:  No cough. No wheezing.  CARDIOVASCULAR: No chest pain, no orthopnea, no PND.  GASTROINTESTINAL: No nausea. No vomiting. No abdominal pain.  GENITOURINARY:  No dysuria.  ENDOCRINE: No polyuria or nocturia.    HEMATOLOGIC: No anemia or easy bruising.  MUSCULOSKELETAL: The patient has no joint pains.  NEUROLOGIC: No numbness or weakness. Does have history of myasthenia gravis.  PSYCHIATRIC: The patient has anxiety.   PHYSICAL EXAMINATION: VITAL SIGNS: Temperature 98, blood pressure 219/102, heart rate 89, O2 sats 100% on room air.  GENERAL: She is alert, awake, oriented, elderly female, not in distress, answering questions appropriately.  HEAD: Normocephalic, atraumatic.  EYES: Pupils equal, reacting to light. Extraocular movements are intact.  ENT: No tympanic membrane congestion. No turbinate hypertrophy. No apparent erythema.  NECK: Supple. No JVD. No carotid bruit.  The patient has no thyroid enlargement.  CARDIOVASCULAR: S1, S2 regular. PMI is nondisplaced. Peripheral pulses are intact.  LUNGS: Clear to auscultation. No wheeze. No rales. Not using accessory muscles for respiration.  ABDOMEN: Soft, nontender, nondistended. Bowel sounds present.  No organomegaly, no hernias.  EXTREMITIES: No extremity edema. No cyanosis. No clubbing.  LYMPHATICS: No lymphadenopathy in cervical or axillary again.  NEUROLOGIC: Alert, awake, oriented. Cranial nerves II through XII intact. Power 5/5 in upper and lower  extremities. Senses are intact. DTRs 2+ bilaterally.  PSYCHIATRIC: Mood and affect are within normal limits. The patient is slightly anxious and tearful, saying that she did not know that she was taking this Amaryl.   LABORATORY, DIAGNOSTIC AND RADIOLOGICAL DATA:   UA  is clear, yellow-colored urine. WBC 12.9, hemoglobin 12.2, hematocrit 36.7, platelets 225. Troponin less than 0.02. Electrolytes: Sodium 144,  potassium 3, chloride 113, bicarb 29, BUN 20, creatinine 1, glucose 52. The patient's blood sugars at 12:30 was 41, at 1:30 was 55, at 2:30 it is 100. EKG shows normal sinus 86 beats per minute, no ST-T changes.   ASSESSMENT AND PLAN: 1.  The patient is a 69 year old female patient with dizziness and shaking and weakness due to hypoglycemia. The patient has hypoglycemia secondary to sulfonylureas.  She is prone for recurrent hypoglycemia. We are going to keep her in the hospital and monitor her sugars every 2 hours. Continue IV fluids, D5/half at 100 mL an hour, and hold Amaryl at this time and watch closely for further hypoglycemia.  2.  Malignant hypertension. Blood pressure is very elevated on admission. We are going to continue her home medication including losartan, add hydralazine 20 mg q.6 hours.  3.  History of myasthenia gravis. Continue pyridostigmine and prednisone. Has slight leukocytosis, likely due to steroids.  4.  History of mood disorder. She is on BuSpar and Lamictal, continue them.  5.  Hypokalemia. Replace the potassium.   TIME SPENT ON HISTORY AND PHYSICAL: 60 minutes.    ____________________________ Epifanio Lesches, MD sk:cs D: 11/20/2013 15:55:00 ET T: 11/20/2013 18:29:51 ET JOB#: [id number]  cc: Epifanio Lesches, MD, <Dictator> Epifanio Lesches MD ELECTRONICALLY SIGNED 11/21/2013 13:19

## 2014-12-30 NOTE — Discharge Summary (Signed)
PATIENT NAME:  Mackenzie Rose, Mackenzie Rose MR#:  [id number] DATE OF BIRTH:  09/17/1945  DATE OF ADMISSION:  12/09/2013 DATE OF DISCHARGE:  12/12/2013  PRIMARY CARE PHYSICIAN: Dr. Brunetta Genera.   CHIEF COMPLAINT: Lethargy and hypoglycemia.   DISCHARGE DIAGNOSES:  1. Metabolic encephalopathy due to hypoglycemia, resolved.  2. Hyperglycemia with unclear etiology with elevated C-peptide.  3. Bipolar disorder.  4. History of myasthenia gravis.  5. Chronic obstructive pulmonary disease.  6. History of dementia.  7. Chronic kidney disease stage III.  8. Hypokalemia.   DISCHARGE MEDICATIONS: Lamotrigine 100 mg 2 times a day, pyridostigmine 60 mg 1-1/2 tabs every 8 hours, trazodone 50 mg 1/2 tab at bedtime as needed, pravastatin 20 mg daily, donepezil 5 mg once a day, multivitamin 1 tab daily, buspirone 5 mg 3 times a day, sodium chloride nasal spray 2 sprays into each nostril every 25-60 minutes as needed for congestion, Advair 250/50 mcg inhaled powder 1 puff every 12 hours, losartan 50 mg 2 times a day, prednisone 20 mg 2 tabs every other day, quetiapine 200 mg 1 tab once a day at bedtime, meclizine 25 mg every 4-6 hours as needed, Tylenol 650 mg every 4 hours as needed, tramadol 50 mg 1/2 tab every 6 hours as needed, Glucagon Emergency Kit 1 mg injectable if blood sugar is below 40 or symptomatic and may repeat x 1 in 20 minutes if persistently low blood sugar.   DIET: Low-sodium, low-fat, low-cholesterol ADA diet. Glucerna dietary supplements 3 times a day.   ACTIVITY: As tolerated.   FOLLOWUP: Please follow with PCP within 1-2 weeks. Please follow with Dr. Gabriel Carina , an endocrine doctor, within the next 1-2 weeks. Please follow with your blood work that is still pending, the sulfonylurea labs as discussed. Next, check blood glucose 3 times a day with meals and at bedtime and discuss with your doctor if greater than 250 or less than 70.   SIGNIFICANT LABS AND IMAGING: Initial BUN 31, creatinine 1.24, sodium 143,  potassium of 3.1. Initial glucose we have here was 97, but it dropped down to 22, improved back up to 88 on the day of admission, but did drop back down to 40s. Furthermore, it also dropped down on April 4 morning to 22, and since then, no other episodes of hypoglycemia. White count of 9.8, hemoglobin 11.7, platelets of 225. Urinalysis: There are nitrites and trace leukocyte esterase, 13 WBC, 1+ bacteria. Insulin plus C-peptide shows insulin within normal limits, but C-peptide serum is elevated at 15.8. Chest, PA and lateral, x-ray showing no active cardiopulmonary disease.   HISTORY OF PRESENT ILLNESS AND HOSPITAL COURSE: For full details of H and P, please see the dictation on April 3 by Dr. Verdell Carmine, but briefly, this is a 69-year-old from assisted living facility who came in with lethargy, decreased responsiveness, and noted to be hypoglycemic. She was in the hospital a couple of weeks ago for dizziness, tremors, and noted to be hypoglycemic, and at that time was taken off the glimepiride. Here, blood sugars were as low as 20s and she was started on D5 drip and admitted to the hospitalist service. She was on no diabetes medications at that time. Her metabolic encephalopathy resolved with improvement in the blood sugar; however, the following morning, she again had low blood sugar and IV fluid was changed to a D10 drip. For the hypoglycemia, a C-peptide and insulin level in addition to sulfonylurea screen was sent. Unfortunately, the sulfonylurea screen takes up to 7 business days per the  lab; however, insulin level is normal.  However, the C-peptide level was elevated. She has been off the D10 drip for greater than 24 hours without any further episodes of low blood sugars and is not symptomatic. Given the findings of the elevated C-peptide, she needs further workup and I did discuss the case with endocrine, Dr. Gabriel Carina, explained the situation, who stated that she will be happy to see the patient as an outpatient.  This was conveyed to the patient who verbalized understanding. The C-peptide is elevated; however, the insulin is within normal limits so there is a discrepancy there. She will be discharged on glucagon with Glucerna and needs to follow up with PCP as well as check her blood sugars 4 times a day as discussed. At this point, the exact etiology of the hypoglycemia is not clear, but it does need further investigation.   TOTAL TIME SPENT: 45 minutes.   CODE STATUS: The patient is full code.    ____________________________ Vivien Presto, MD sa:dd D: 12/12/2013 16:37:20 ET T: 12/12/2013 20:05:00 ET JOB#: [id number]  cc: Vivien Presto, MD, <Dictator> A. Lavone Orn, MD Meindert A. Brunetta Genera, MD  Vivien Presto MD ELECTRONICALLY SIGNED 01/05/2014 14:22

## 2015-01-01 ENCOUNTER — Ambulatory Visit
Admit: 2015-01-01 | Disposition: A | Discharge status: Home / Self Care | Payer: Self-pay | Attending: Physician Assistant | Admitting: Physician Assistant

## 2015-01-07 NOTE — H&P (Signed)
PATIENT NAME:  Mackenzie Rose, PILGER MR#:  [id number] DATE OF BIRTH:  1946/03/27  DATE OF ADMISSION:  12/07/2014  REFERRING PHYSICIAN: Yetta Numbers. Karma Greaser, MD  PRIMARY CARE PRACTITIONER: Nonlocal.   ADMITTING DOCTOR: Juluis Mire, MD    CHIEF COMPLAINT:  1.  Fever.  2.  Nausea, vomiting, and diarrhea of 1 day duration.    HISTORY OF PRESENT ILLNESS: A 69 year old Caucasian female with a history of multiple medical problems which include hypertension, COPD, myasthenia gravis, bipolar disorder, dementia, recent choledocholithiasis status post ERCP, nursing home resident who was brought by EMS with the complaints of fever of 105 degrees Fahrenheit. The patient was also noted to have nausea, vomiting, and diarrhea of 1 day duration. The patient states that she was in her usual state of health until yesterday. She started developing fever and chills associated with some nausea, vomiting, and diarrhea, hence brought to the Emergency Room. Denies any blood or mucus in the vomitus or stools. No abdominal pain. She does complain of some cough with increased expectoration, but denies any chest pain. No dizziness. No palpitations. She also denies any dysuria, but she does have chronic frequency of urination. Denies any focal weakness or numbness, but she does complain of generalized weakness. In the Emergency Room, the patient was noted to have a temperature of 104 degrees Fahrenheit on arrival with oxygen saturations 88% on room air. After obtaining pancultures, the patient was started on IV antibiotics and also placed on oxygen supplementation, following which her oxygen saturations improved. The patient denies any shortness of breath at this time. She also denies any chest pain. Workup in the Emergency Room revealed elevated white blood cell count of 15.8 and was also noted to have mildly elevated creatinine of 1.55 and a mild elevation of the troponin. The patient was started on IV antibiotics, namely vancomycin and  Levaquin, and hospitalist service was consulted for further evaluation and management. The patient does have some chronic mild dementia and, according to transfer summary she was noted to have mild altered sensorium, but the patient is alert, awake, and oriented x 3 and able to give full history at this time.    PAST MEDICAL HISTORY:  1.  Hypertension.  2.  COPD.  3.  Myasthenia gravis.  4.  Bipolar disorder/OCD.  5.  Mild dementia.  6.  Choledocholithiasis status post ERCP with sphincterotomy and stone removal recently.   PAST SURGICAL HISTORY:  1.  Tubal ligation.  2.  Melanoma excision on the right arm.  3.  Right eye surgery.   ALLERGIES: PENICILLIN, LITHIUM, LACTOSE.   FAMILY HISTORY: Positive for coronary artery disease, diabetes, and stroke.   SOCIAL HISTORY: She is a resident of nursing home at [facility]. She is widowed. Walks with the help of a walker. History of smoking 1 pack per day. Denies any alcohol, substance abuse.    HOME MEDICATIONS:  1.  Advair Diskus 250/50 mcg 1 puff twice a day.  2.  Buspirone 10 mg tablet 1 tablet orally 2 times a day.  3.  Calcium with vitamin D 500/400 international units once a day.  4.  Carvedilol 3.125 mg 1 tablet orally 2 times a day.  5.  Saline nasal spray 1 spray in each nostril once a day.  6.  Diltiazem extended 180 mg capsule 1 capsule once a day.  7.  Donepezil 10 mg 1 tablet orally once a day.  8.  Guaifenesin coughs syrup 5 mL as needed for cough.  9.  Lamotrigine 100 mg oral tablet 1 tablet orally 2 times a day.  10.  Losartan 50 mg 1 tablet orally once a day.  11.  Multivitamin 1 tablet orally once a day.  12.  Pravastatin 20 mg tablet 1 tablet orally once a day.  13.  Prednisone 10 mg tablet 3-1/2 tablets orally every other day.  14.  Pyridostigmine 60 mg oral tablet 1 tablet orally every 8 hours.  15.  Quetiapine 200 mg tablet 1 tablet orally once a day at bedtime.  16.  Tramadol 50 mg 1 tablet orally 2  times a day.  17.  Trazodone 50 mg 1 tablet orally at bedtime as needed for insomnia.  18.  Tylenol Arthritis 650 mg 1 tablet orally 3 times a day as needed.  19.  Voltaren topical gel 1% apply topically as needed for affected areas twice a day.    REVIEW OF SYSTEMS:  CONSTITUTIONAL: Positive for fever and chills of 1 day duration, generalized weakness present.  EYES: Negative for blurred vision, double vision. No pain. No redness. No discharge.  EARS, NOSE, AND THROAT: Negative for tinnitus, ear pain, hearing loss, epistaxis, nasal discharge.  RESPIRATORY: Positive for increased cough with expectoration. Denies any wheezing. No dyspnea, no hemoptysis, no painful respirations.  CARDIOVASCULAR: Negative for chest pain, palpitations, dizziness, syncopal episodes, orthopnea, dyspnea on exertion, pedal edema.  GASTROINTESTINAL: Positive for nausea, vomiting, and diarrhea of 1 day duration. No abdominal pain. No blood or mucus in the stools.   GENITOURINARY: Positive for frequency of urination. Denies any dysuria or urgency.  ENDOCRINE: Negative for polyuria, nocturia, heat or cold intolerance.  HEMATOLOGIC AND LYMPHATIC: Negative for easy bruising, bleeding, or swollen glands.  INTEGUMENTARY: Negative for acne, skin rash, or lesions.  MUSCULOSKELETAL: Negative for neck or back pain or shoulder pain. No history of arthritis or gout.  NEUROLOGICAL: Negative for focal weakness or numbness. No history of CVA, TIA, or seizure disorder.  PSYCHIATRIC: Positive for bipolar disorder/OCD stable on home medications.   PHYSICAL EXAMINATION:  VITAL SIGNS: On arrival to the Emergency Room: Temperature of 105.2 degrees Fahrenheit, pulse rate 126 per minute, respirations 25, blood pressure 136/81, oxygen saturation 88% on room air. Current Vital Signs: Temperature 103 degrees Fahrenheit, pulse rate 126 per minute, respirations 20 per minute, blood pressure 151/73, oxygen saturation 100% on 2 L nasal cannula.   GENERAL: Well developed, well nourished, alert, oriented, comfortably resting in the bed at this time in no acute distress.  HEAD: Atraumatic, normocephalic.  EYES: Pupils are equal, react to light and accommodation. No conjunctival pallor. No icterus. Extraocular movements intact.  NOSE: No drainage. No lesions.  ORAL CAVITY: No mucosal lesions. No exudates.  EARS: No drainage. No external lesions.  NECK: Supple. No JVD. No thyromegaly. No carotid bruit. Range of motion of neck within normal limits.  RESPIRATORY: Good respiratory effort. Not using accessory muscles of respiration. Bilateral vesicular breath sounds present. A few rales at the right base. No rhonchi.  CARDIOVASCULAR: S1, S2 regular. No murmurs, gallops, or clicks. Tachycardia present. Peripheral pulses equal at carotid, femoral, and pedal pulses. No peripheral edema.  GASTROINTESTINAL: Abdomen soft, obese, nontender. No hepatosplenomegaly. No masses. No rigidity. No guarding. Bowel sounds present and equal in all 4 quadrants.  GENITOURINARY: Deferred.  MUSCULOSKELETAL: No joint tenderness or effusion. Range of motion adequate. Strength and tone equal bilaterally.  SKIN: Inspection within normal limits. No obvious wounds.  LYMPHATIC: No cervical lymphadenopathy.  VASCULAR: Good dorsalis pedis and posterior  tibial pulses.  NEUROLOGICAL: Alert, awake, and oriented x 3. Cranial nerves II-XII grossly intact. No sensory deficit. Motor strength 5/5 in both upper and lower extremities. DTRs 2+ bilaterally and symmetrical. Plantars downgoing.  PSYCHIATRIC: Alert, awake, and oriented x 3. Judgment and insight adequate. Memory and mood within normal limits.   ANCILLARY DATA:  LABORATORY DATA: Serum glucose 115, BUN 20, creatinine 1.51, sodium 141, potassium 3.2, chloride 105, bicarbonate 27, calcium 8.9, magnesium 1.8, venous lactic acid 2.0. Total protein 6.1, albumin 3.3, total bilirubin 0.6, alkaline phosphatase 67, AST 26, ALT 11.  Troponin 0.12. WBC 15.8, hematocrit 32.6, hemoglobin 10.1, platelet count 159,000. Prothrombin time 14.2, INR 1.1. Urinalysis: Clear, 1+ blood, protein 100, nitrite negative, leukocyte esterase negative, WBC 5 per high-powered field, RBCs 2, bacteria trace.   IMAGING STUDIES: Chest x-ray: Linear right basilar opacities, likely atelectasis.   EKG: Sinus tachycardia with ventricular rate of 126 beats per minute. Right bundle branch block, left anterior fascicular block. No acute ST-T changes.   ASSESSMENT AND PLAN: A 69 year old Caucasian female nursing home resident with a history of multiple medical problems who presents with fever of 105 degrees Fahrenheit, nausea, vomiting, diarrhea with cough with expectoration, found to have elevated white blood cell count with tachycardia and workup revealed sepsis with a urinary tract infection and a probable right lower lobe pneumonia. Also, noted to have acute kidney injury and a mildly elevated troponin.  1.  Sepsis. Likely source urinary tract infection. Probable right lower lobe pneumonia. Plan: Admit. Pancultures. Intravenous antibiotics with vancomycin and Levaquin, Tylenol p.r.n. Follow up cultures and CBC. Follow clinically. Order stool for culture and sensitivity and Clostridium difficile toxin in view of nausea, vomiting, and diarrhea.   2.  Acute kidney injury. Mildly elevated creatinine. Plan: Gentle intravenous hydration. Follow up BMP and avoid nephrotoxic agents.  3.  Elevated troponin, mild. No chest pain. No acute EKG changes, likely demand ischemia secondary to sepsis. Plan: Admit to telemetry monitoring and cycle cardiac enzymes. Continue beta blocker, aspirin, statin. We will order echocardiogram. Cardiology consultation for further advice.  4.  Hypokalemia, mild secondary to gastrointestinal loss from vomiting and diarrhea. Plan: Potassium replacement. Follow BMP.  5.  Chronic obstructive pulmonary disease. Stable. No acute exacerbations.  Continue home medications and oxygen as needed.  6.  Hypertension, stable on home medications. Continue same.   7.  Bipolar disorder on home medications. Continue same.  8.  Myasthenia gravis, stable on home medications. Continue same.  9.  History of dementia, mild. The patient is stable on Aricept. Continue same.  10.  Deep vein thrombosis prophylaxis. Subcutaneous heparin.  11.  Gastrointestinal prophylaxis. Proton pump inhibitor.   CODE STATUS: Full code.   TIME SPENT: 55 minutes.    ____________________________ Juluis Mire, MD enr:bm D: 12/07/2014 01:14:28 ET T: 12/07/2014 02:00:25 ET JOB#: [id number]  cc: Juluis Mire, MD, <Dictator> Juluis Mire MD ELECTRONICALLY SIGNED 12/07/2014 18:31

## 2015-01-07 NOTE — Consult Note (Signed)
PATIENT NAME:  Mackenzie Rose, Mackenzie Rose MR#:  [id number] DATE OF BIRTH:  15-Oct-1945  DATE OF CONSULTATION:  10/26/2014  REFERRING PHYSICIAN:   CONSULTING PHYSICIAN:  Tabita Corbo A. Jolicia Delira, MD  REASON FOR CONSULTATION: Right upper quadrant pain and gallstones.  HISTORY OF PRESENT ILLNESS:: Mackenzie Rose is a pleasant 69 year old female with a history of multiple medical issues including COPD and myasthenia gravis on steroids. She presents with a 2 day history of right upper quadrant pain. She says that she had a similar episode which was much shorter approximately 6 months ago. Today it was increasingly painful and unbearable per her report. Currently without pain, says it has resolved completely. Did have some subjective chills. No nausea or vomiting. No diarrhea or constipation. No, fever, chest pain, shortness of breath, cough. Last bowel movement was 2 days ago, which was normal.   PAST MEDICAL HISTORY: Includes COPD, myasthenia gravis on steroids, hypertension, bipolar, history of tubal ligation in the 1970s, history of melanoma removed from the right arm, history of right eye surgery.   HOME MEDICATIONS: Tylenol p.r.n. pain, trazodone, tramadol, sodium chloride nasal spray, Seroquel, pyridostigmine, prednisone, pravastatin, multivitamin, meclizine, losartan, lamotrigine, donepezil, buspirone, Advair Diskus.   ALLERGIES: PENICILLIN, LITHIUM AND LACTOSE.   SOCIAL HISTORY: Still smokes about a pack a day. No alcohol use. No tobacco use.   FAMILY HISTORY: History of cancer, heart problems, hypertension.  REVIEW OF SYSTEMS: A 12-point review of systems is obtained. Pertinent positives and negatives as above.   PHYSICAL EXAMINATION: VITAL SIGNS: Temperature 98.7, pulse 90, blood pressure 137/62, respirations 19, 90% on room air.  GENERAL: No acute distress. Alert and oriented x3.  HEAD: Normocephalic, atraumatic.  EYES: No scleral icterus. No conjunctivitis.  FACE: No obvious facial trauma. Normal  external nose. Normal external ears.  CHEST: Lungs clear to auscultation. Moving air well.  HEART: Regular rate and rhythm. No murmurs, rubs or gallops. ABDOMEN: Soft, nontender, nondistended.  EXTREMITIES: Moves all extremities well.  NEUROLOGIC: Cranial nerves II through XII grossly intact.   LABORATORY DATA: Significant for white cell count of 13.3, hemoglobin of 9.4, hematocrit of 31.2, platelets are 145,000, bilirubin is 0.7, AST is 58, ALT is 33. Blood sugar is 172. Creatinine is 1.4  Ultrasound shows multiple small calculi, positive Murphy sign on exam. Common bile duct is dilated to 1 cm with visible choledocholithiasis.  ASSESSMENT AND PLAN: Mackenzie Rose is a pleasant 69 year old female with a history of myasthenia gravis and chronic obstructive pulmonary disease, who presents with initial right upper quadrant pain and vomiting, has since resolved. Does have mild leukocytosis but is on steroids but has no pericholecystic fluid, minimal gallbladder wall thickening and is completely nontender. Do not suspect cholecystitis but does have choledocholithiasis. Recommend admission to medicine due to the patient's multiple comorbidities and medical issues for management and GI consult to address choledocholithiasis. We will discuss cholecystectomy, but the patient is high risk due to myasthenia gravis. We will continue to follow.    ____________________________ Glena Norfolk Jermarion Poffenberger, MD cal:jh D: 10/26/2014 06:37:09 ET T: 10/26/2014 10:33:12 ET JOB#: [id number]  cc: Harrell Gave A. Cande Mastropietro, MD, <Dictator> Floyde Parkins MD ELECTRONICALLY SIGNED 10/26/2014 22:33

## 2015-01-07 NOTE — Consult Note (Signed)
Chief Complaint:  Subjective/Chief Complaint No abd pain post ERCP with stone extraction. LFT back to normal.   VITAL SIGNS/ANCILLARY NOTES: **Vital Signs.:   20-Feb-16 07:25  Vital Signs Type Recheck  Pulse Pulse 91  Systolic BP Systolic BP 255  Diastolic BP (mmHg) Diastolic BP (mmHg) 83  Mean BP 114   Brief Assessment:  GEN no acute distress   Cardiac Regular   Respiratory clear BS   Gastrointestinal Normal   Lab Results: Hepatic:  20-Feb-16 05:41   Bilirubin, Total 0.3  Alkaline Phosphatase 116  SGPT (ALT) 41  SGOT (AST) 30  Total Protein, Serum  5.2  Albumin, Serum  2.3  Routine Chem:  20-Feb-16 05:41   Glucose, Serum 91  BUN  23  Creatinine (comp) 1.26  Sodium, Serum  149  Potassium, Serum 3.5  Chloride, Serum  114  CO2, Serum 29  Calcium (Total), Serum  8.4  Osmolality (calc) 299  eGFR (African American)  54  eGFR (Non-African American)  45 (eGFR values <48m/min/1.73 m2 may be an indication of chronic kidney disease (CKD). Calculated eGFR, using the MRDR Study equation, is useful in  patients with stable renal function. The eGFR calculation will not be reliable in acutely ill patients when serum creatinine is changing rapidly. It is not useful in patients on dialysis. The eGFR calculation may not be applicable to patients at the low and high extremes of body sizes, pregnant women, and vegetarians.)  Anion Gap  6   Assessment/Plan:  Assessment/Plan:  Assessment CBD stone, extracted during ERCP. Stable now.   Plan GB surgery if and when surgery decides. Will sign off. Thanks.   Electronic Signatures: OVerdie Shire(MD)  (Signed [id number])  Authored: Chief Complaint, VITAL SIGNS/ANCILLARY NOTES, Brief Assessment, Lab Results, Assessment/Plan   Last Updated: 20-Feb-16 11:45 by OVerdie Shire(MD)

## 2015-01-07 NOTE — H&P (Signed)
PATIENT NAME:  Mackenzie Rose, Mackenzie Rose MR#:  [id number] DATE OF BIRTH:  11-Mar-1946  DATE OF ADMISSION:  12/12/2014  REFERRING PHYSICIAN:  Conni Slipper, MD   PRIMARY CARE PRACTITIONER:  Nonlocal.   ADMITTING PHYSICIAN:  Juluis Mire, MD   CHIEF COMPLAINT: 1.  Shortness of breath.  2.  Generalized weakness.  3.  Cough with sputum.   HISTORY OF PRESENT ILLNESS:  A 69 year old Caucasian female with recent discharge from [facility] on 12/10/2014, following treatment for right lower lobe pneumonia and UTI, who was sent from [facility] with the complaints of shortness of breath associated with generalized weakness. The patient was recently admitted to [facility] earlier this week with fever and shortness of breath and was treated for right lower lobe pneumonia and UTI and was discharged to assisted living place on 12/10/2014. The patient was noted to have shortness of breath with generalized weakness for the past 1 to 2 days, hence was sent to the Emergency Room for further evaluation. The patient admits to having cough with productive sputum, but denies any fever. She does have some mild wheezing. No nausea, vomiting, or diarrhea. No chest pain. No focal weakness or numbness. In the Emergency Room, the patient was evaluated by the ED physician and was found to be mildly febrile with a temperature of 99.4 and her blood pressure was 181/79. Workup revealed elevated white blood cell count of 14.9 and a chest x-ray with right lower lobe pneumonia. Hence, the hospitalist service was consulted for further management. The patient complains of shortness of breath with wheezing and states she is feeling slightly better at this time.   PAST MEDICAL HISTORY: 1.  Hypertension.  2.  COPD.  3.  Myasthenia gravis.  4.  Bipolar disorder.  5.  Mild dementia.  6.  Cholelithiasis status post ERCP with sphincterotomy and stone removal.   PAST SURGICAL HISTORY: 1.   Tubal ligation.  2.  Melanoma excision of the right arm. 3.  Right eye surgery.   ALLERGIES:  PENICILLIN, LITHIUM, LACTOSE.   FAMILY HISTORY:  Positive for coronary artery disease, diabetes, and stroke.   SOCIAL HISTORY:  She is a resident of [facility]. She is widowed. She walks with the help of a walker. History of smoking 1 pack per day. Denies any alcohol or substance abuse.   HOME MEDICATIONS:   1.  Advair Diskus 250/50 mcg 1 puff 2 times a day.  2.  Bisoprolol/hydrochlorothiazide 5 mg/6.25 mg 1 tablet orally once a day.  3.  Buspirone 10 mg tablet 1 tablet orally 2 times a day.  4.  Calcium with vitamin D 500/400 international units 1 tablet orally once a day.  5.  Carvedilol 3.125 mg 1 tablet orally 2 times a day.  6.  Saline nasal spray 1 or 2 sprays in each nostril once a day.  7.  Diltiazem extended-release 180 mg oral capsule 1 capsule orally once a day.  8.  Donepezil 10 mg 1 tablet orally once a day.  9.  Guaifenesin cough syrup 10 mL every 8 hours as needed for cough.  10.  Lamotrigine 100 mg tablet 1 tablet orally 2 times a day.  11.  Levofloxacin 250 mg 1 tablet orally once a day.  12.  Losartan 50 mg 1 tablet orally once a day.  13.  Multivitamin 1 tablet orally once a day.  14.  Pepto-Bismol 5 mL orally 4 times a day as needed for  diarrhea.  15.  Pravastatin 20 mg 1 tablet orally once a day.  16.  Prednisone 10 mg oral tablet 3-1/2 tablets orally every other day.   REVIEW OF SYSTEMS: CONSTITUTIONAL:  Negative for fever or chills. Positive for fatigue and generalized weakness.  EYES:  Negative for blurred vision or double vision. No pain. No redness. No discharge.  EARS, NOSE, AND THROAT:  Negative for tinnitus, ear pain, hearing loss, epistaxis, nasal discharge.  RESPIRATORY:  Positive for shortness of breath with wheezing and cough with expectoration. No hemoptysis. No painful respiration.  CARDIOVASCULAR:  Negative for chest pain, palpitation,  dizziness, syncopal episode, orthopnea, dyspnea on exertion, or pedal edema.  GASTROINTESTINAL:  Negative for nausea, vomiting, diarrhea, abdominal pain, hematemesis, melena, or rectal bleeding.  GENITOURINARY:  Negative for dysuria, frequency, urgency, or hematuria.  ENDOCRINE:  Negative for polyuria, nocturia, or heat or cold intolerance.  Hematologic and lymphatic:  Negative for easy bruising, bleeding, or swollen glands.  INTEGUMENTARY:  Negative for acne, skin rash, or lesions.  MUSCULOSKELETAL:  Negative for neck or back pain. No arthritis. No gout.  NEUROLOGICAL:  Negative for focal weakness or numbness. No history of CVA, TIA, or seizure disorder.  PSYCHIATRIC:  Positive for bipolar disorder, stable on home medications.   PHYSICAL EXAMINATION: VITAL SIGNS:  Temperature 99.4 degrees Fahrenheit, pulse rate 95 per minute, respirations 22 per minute on arrival, current respirations 18 per minute, blood pressure 180/83, oxygen saturation 95% on room air.  GENERAL:  Well developed, well nourished, alert, in no acute distress, comfortable, resting in the bed.  HEAD:  Atraumatic, normocephalic.  EYES:  Pupils are equal, round, and reactive to light and accommodation. No conjunctival pallor. No icterus. Extraocular movements are intact.  NOSE:  No drainage. No lesions.  EARS:  No drainage. No external lesions.  ORAL CAVITY:  No mucosal lesions.  NECK:  Supple. No JVD. No thyromegaly. No carotid bruit. Range of motion of the neck is within normal limits.  RESPIRATORY:  Good respiratory effort. Not using accessory muscles of respiration. Bilateral vesicular breath sounds present. Bilateral few rhonchi present. Rales at the right base.  CARDIOVASCULAR:  S1 and S2, regular. No murmurs, gallops, or clicks. Pulses are equal at carotid, femoral, and pedal pulses. There is 1+ pedal edema bilaterally.  GASTROINTESTINAL:  Abdomen is soft and nontender. No hepatosplenomegaly. No masses. No guarding. No  rigidity. Bowel sounds are present and equal in all 4 quadrants.  GENITOURINARY:  Deferred.  MUSCULOSKELETAL:  No joint tenderness or effusion. Range of motion is adequate. Strength and tone are equal bilaterally.  SKIN:  Inspection within normal limits. No obvious wounds.  LYMPHATIC:  No cervical lymphadenopathy.  VASCULAR:  Good dorsalis pedis and posterior tibial pulses.  NEUROLOGICAL:  Alert, awake, and oriented x 3. Cranial nerves II through XII are grossly intact. No sensory deficit. Motor strength is 5/5 in both upper and lower extremities. DTRs are 2+ bilaterally and symmetrical.  PSYCHIATRIC:  Alert, awake, and oriented x 3. Judgment and insight are adequate. Memory and mood are within normal limits.    ANCILLARY DATA:   LABORATORY DATA:  Serum glucose is 187, BUN 21, creatinine 1.22, sodium 141, potassium 3.4, chloride 104, bicarbonate 28, total calcium 8.9, total protein 5.9, albumin 3.0, total bilirubin 0.1, alkaline phosphatase 76, AST 21, and ALT is 13. Troponin is 0.05. TSH is 0.871. WBC is 14.9, hemoglobin 10.4, hematocrit 33.2, and platelet count 172,000.   IMAGING STUDIES:  Chest x-ray shows developing consolidation in the  right lower lobe with small right pleural effusion compatible with pneumonia.   EKG:  Normal sinus rhythm with ventricular rate of 84 beats per minute, right bundle branch block, and left atrial enlargement.   ASSESSMENT AND PLAN:  A 69 year old Caucasian female, a resident of assisted living center, recently discharged from [facility] on 12/10/2014, following treatment for right lower lobe pneumonia and urinary tract infection, who presents with shortness of breath with generalized weakness and cough with expectoration, found to have right lower lobe pneumonia on chest x-ray, elevated white blood cell count, and also noted to have chronic obstructive pulmonary disease exacerbation.   1.  Right lower lobe pneumonia, persistent versus  recurrent, recent [facility] discharge on 12/10/2014, healthcare-associated pneumonia. Plan:  Admit to medical floor, blood and sputum cultures obtained, IV antibiotics with vancomycin and Levaquin, oxygen supplementation, DuoNebs, and follow up labs and clinically.  2.  Shortness of breath secondary to chronic obstructive pulmonary disease exacerbation due to right lower lobe pneumonia. Plan:  Oxygen supplementation, IV Solu-Medrol, DuoNebs, continue home medications for chronic obstructive pulmonary disease.  3.  Chronic kidney disease stage III, stable. Creatinine is better compared to the baseline. The patient is stable. Monitor BMP.  4.  Hypertension, stable on home medication. Continue same. Follow blood pressure checks.  5.  Bipolar disorder, stable on home medications. Continue same.  6.  Myasthenia gravis, stable on home medications. Continue same.   7.  Dementia, stable on Aricept. Continue same.  8.  Deep vein thrombosis prophylaxis. Subcutaneous Lovenox.  9.  Gastrointestinal prophylaxis with proton pump inhibitor.   CODE STATUS:  Full code.   TIME SPENT:  55 minutes.     ____________________________ Juluis Mire, MD enr:nb D: 12/13/2014 00:57:32 ET T: 12/13/2014 03:08:03 ET JOB#: [id number]  cc: Juluis Mire, MD, <Dictator> [name] Branch MD ELECTRONICALLY SIGNED 12/14/2014 17:49

## 2015-01-07 NOTE — Consult Note (Signed)
Pt seen and examined. Pt with epigastric/RUQ pain. Pain is mild now. No nausea at this time. U/S showed dilated CBD with CBD stone. LFT mildly elevated. Pt with COPD and myathenia gravis. Still smokes. Only mild abdominal tenderness. Discussed ERCP in detail with patient. Discussed potential risks, incl reaction to sedation, pancreatitis, and bleeding. Please make sure not to place pts on lovenox prior to potential ERCP's!. Did lovenix early this AM but now on hold. Will proceed with ERCP. GB surgery later. Thanks  Electronic Signatures: Verdie Shire (MD)  (Signed on 19-Feb-16 11:20)  Authored  Last Updated: 19-Feb-16 11:20 by Verdie Shire (MD)

## 2015-01-07 NOTE — Consult Note (Signed)
PATIENT NAME:  Mackenzie Rose, Mackenzie Rose MR#:  [id number] DATE OF BIRTH:  Apr 04, 1946  DATE OF CONSULTATION:  10/27/2014  REFERRING PHYSICIAN:   CONSULTING PHYSICIAN:  Lupita Dawn. Christiano Blandon, MD  REASON FOR REFERRAL: Dilated common bile duct with choledocholithiasis.   DESCRIPTION: The patient is a 69 year old, white female, with known COPD, myasthenia gravis, anxiety and bipolar disorder, who came to the Emergency Room complaining of epigastric/right upper quadrant pain associated with nausea for a 1-day duration. She did not have any fevers or chills, or diarrhea or constipation. There is no chest pain or shortness of breath. In the Emergency Room, her white blood cell count was elevated at 13,000. She had an ultrasound that showed a dilated common bile duct with evidence of choledocholithiasis. As a result, the patient was brought in for further evaluation and admission. The patient was placed on antibiotics and some pain medications. This morning, the patient is feeling better. There is less nausea. There are no fevers or chills.   PAST MEDICAL HISTORY: Notable for COPD, myasthenia gravis, in addition to hypertension, anxiety and bipolar disorder.   PAST MEDICAL HISTORY: She has had melanoma excision and tubal ligation and surgery on the right eye.   ALLERGIES: SHE IS ALLERGIC TO PENICILLIN, LITHIUM AND ACTOS.   FAMILY HISTORY: Notable for diabetes, stroke and coronary artery disease.   SOCIAL HISTORY: She is a widow. She still smokes 1 pack per day. There is no alcohol use.  MEDICATIONS: Includes inhalers. She takes BuSpar 3 times a day, donepezil 5 mg once a day,m lamotrigine 1 tablet twice a day, losartan 50 mg twice a day, pravastatin 20 mg once a day, prednisone 20 mg 2 tablets every other day, tramadol as needed, trazodone as needed, Tylenol as needed. She also takes quetiapine at bedtime, and pyridostigmine 1-1/2 tablets every 8 hours.   REVIEW OF SYSTEMS: There is no change from the initial review of  symptoms dictated by the admitting hospitalist. Please refer to this.   PHYSICAL EXAMINATION:  GENERAL: The patient is in no acute distress. She usually keeps her eyes closed.  VITAL SIGNS: She is afebrile. Her vital signs are stable.  HEENT: Shows normocephalic, atraumatic head. Pupils are equally reactive. Throat was clear.  NECK: Supple.  CARDIAC: Revealed regular rhythm and rate without murmurs.  LUNGS: Clear bilaterally.  ABDOMEN: Showed normoactive bowel sounds, soft. There is some mild tenderness in the right upper quadrant area. There is no hepatomegaly. She has active bowel sounds.  EXTREMITIES: Showed no clubbing, cyanosis, or edema.  SKIN: Negative.   LABORATORIES: On admission, bilirubin is 0.7, AST was 50, ALT 33. Creatinine is 1.39, sodium 142, potassium 3.6. Lipase was normal at 140, white count is 13.3, hemoglobin was 9.4.   Ultrasound of the abdomen showed a dilated common bile duct at 10 mm with visible choledocholithiasis. There is some thickening of the gallbladder wall, as well.   ASSESSMENT AND PLAN: This is a patient with choledocholithiasis causing right upper quadrant pain. Surgery has already seen the patient for a possible cholecystectomy. I discussed ERCP in detail with the patient. I discussed the potential risks including reaction to medication, pancreatitis and bleeding. The patient was placed on Lovenox on admission. We need to hold the Lovenox to decrease the risk of bleeding. The patient did receive Lovenox early this morning, but we will hold it off for the ERCP. We will schedule the ERCP later today. Gallbladder surgery can be done afterwards, later, if the patient remains stable after ERCP.  Thank you for the referral.   ____________________________ Lupita Dawn. Candace Cruise, MD pyo:JT D: 10/29/2014 07:32:48 ET T: 10/29/2014 08:40:38 ET JOB#: [id number]  cc: Lupita Dawn. Candace Cruise, MD, <Dictator> Lupita Dawn Aayliah Rotenberry MD ELECTRONICALLY SIGNED 11/01/2014 10:33

## 2015-01-07 NOTE — Discharge Summary (Signed)
PATIENT NAME:  [name], [name] MR#:  [id number] DATE OF BIRTH:  19-Jan-1946  DATE OF ADMISSION:  12/07/2014 DATE OF DISCHARGE:  12/10/2014  PRESENTING COMPLAINT: Fever, nausea, vomiting, and diarrhea.   DISCHARGE DIAGNOSES: 1. Sepsis due to pneumonia/urinary tract infection.  2. Acute on chronic  kidney disease stage 3.  3. Hypertension.  4. Saturations 92% on room air.   CODE STATUS: FULL CODE.   MEDICATIONS: 1. Lamotrigine 100 mg p.o. b.i.d.  2. Pravastatin 20 mg daily.  3. Multivitamin daily.  4. Advair Diskus 250/50, one puff b.i.d.  5. Quetiapine 200 mg once a day at bedtime.  6. Buspirone 10 mg b.i.d.  7. Losartan 50 mg daily.  8. Prednisone 10 mg 3-1/2 tablets every other day.  9. Pyridostigmine 60 mg p.o. every 8 hourly.  10. Trazodone 50 mg p.o. daily at bedtime as needed.  11. Carvedilol 3.125 p.o. b.i.d.  12. Tylenol arthritis 650, one tablet every 4 hours as needed.  13. Pink bismuth 5 mL 4 times a day as needed.  14. Preparation-H one application rectally b.i.d.  15. Calcium with vitamin D 1 tablet daily.  16. Diltiazem CD 180 mg 1 capsule daily.  17. Tramadol 50 mg b.i.d.  18. Donezepil 10 mg p.o. at bedtime.  19. Deep Sea nasal spray 2 sprays each nostril daily.  20. Guaifenesin 5 mL p.o. b.i.d. as needed for cough.  21. Voltaren 1% topical cream to affected area b.i.d.  22. Guaifenesin 10 mL every 8 hours as needed.  23. Levofloxacin 250 mg 1 tablet p.o. daily.  24. Home health physical therapy.   DISCHARGE DIET:  Regular consistency.   FOLLOW-UP:  With your primary care physician in 2 to 4 weeks which is Dr. Brunetta Genera.   BRIEF SUMMARY OF HOSPITAL COURSE: Mackenzie Rose is a 69 year old Caucasian female with history of multiple medical problems, presents with fever of 105, nausea, vomiting, diarrhea, cough with productive expectoration, was found to have: 1. Sepsis, suspected due to UTI and right lower lobe pneumonia. The patient was recently admitted for  choledocholithiasis status post ERCP. She was started on broad-spectrum antibiotics, changed to p.o. Levaquin at discharge.   2. Acute on chronic CKD, improved creatinine with IV fluids.  3. Elevated troponin. No chest pain. No acute EKG changes.  Mildly appears demand ischemia in the setting of sepsis.  4. Hypokalemia, repleted.  5. Chronic obstructive pulmonary disease, stable. No acute exacerbation.  6. Hypertension. Continue diltiazem and Coreg.  7. Bipolar disorder, on lamotrigine and quetiapine.  8. Myasthenia gravis. Continue pyridostigmine  and prednisone.  9. Mild dementia on Aricept.  10. DVT prophylaxis. Subcutaneous heparin t.i.d.   Hospital stay otherwise remained stable.  The patient remained a full code.   TIME SPENT: Forty minutes.    ____________________________ Hart Rochester Posey Pronto, MD sap:tr D: 12/12/2014 11:44:05 ET T: 12/12/2014 12:56:44 ET JOB#: [id number]  cc: Damaris Geers A. Posey Pronto, MD, <Dictator> Meindert A. Brunetta Genera, MD Ilda Basset MD ELECTRONICALLY SIGNED 12/12/2014 16:00

## 2015-01-07 NOTE — Consult Note (Signed)
ERCP showed 1 CBD stone that was extracted after biliary sphincterotomy and balloon sweeps. No bleeding. Can start clear liquid diet. Advance as tolerated. Can resume lovenox tomorrow. GB surgery can be arranged anytime. Thanks.  Electronic Signatures: Verdie Shire (MD)  (Signed on 19-Feb-16 12:11)  Authored  Last Updated: 19-Feb-16 12:11 by Verdie Shire (MD)

## 2015-01-07 NOTE — Discharge Summary (Signed)
PATIENT NAME:  Mackenzie Rose, Mackenzie Rose MR#:  [id number] DATE OF BIRTH:  11-Nov-1945  DATE OF ADMISSION:  12/12/2014 DATE OF DISCHARGE:  12/17/2014  PRIMARY CARE PHYSICIAN: Nonlocal.  DISCHARGE DIAGNOSES: 1. Right lower lobe pneumonia. 2. Chronic obstructive pulmonary disease exacerbation.  3. Chronic kidney disease, stage III.  4. Hypertension.  5. Chronic diastolic congestive heart failure, ejection fraction 45%.  6. Myasthenia gravis.  7. Dementia.   CONDITION: Stable.   CODE STATUS: FULL CODE.   HOME MEDICATIONS: Please refer to the medication reconciliation list.  DIET: Low-sodium diet.   ACTIVITY: As tolerated.   FOLLOW-UP CARE: Follow with PCP within 1 to 2 weeks. The patient needs home health with physical therapy.   REASON FOR ADMISSION: Shortness of breath, generalized weakness and cough with sputum.   HOSPITAL COURSE: The patient is a 69 year old, Caucasian female, with recent pneumonia, COPD, hypertension, who was sent to the ED due to shortness of breath, cough with sputum and generalized weakness. The patient was discharged on April 3 after treatment of right lower lobe pneumonia. The patient was noticed to have shortness of breath with generalized weakness for about 1 to 2 days after discharge. The patient was sent to the ED for further evaluation.   The patient's temperature was 99.4 and blood pressure was 181/79 in the ED. WBC 14.9. Chest x-ray showed right lower lobe pneumonia.   For detailed history and physical examination, please refer to the admission note dictated by Dr. Reece Levy.   1. Right lower lobe pneumonia. The patient was treated with vancomycin and Zosyn, then changed to cefepime. The patient's symptoms have much improved. She is off oxygen by nasal cannula. We will change to p.o. antibiotics after discharge. The patient's blood culture is negative.  2. COPD exacerbation. The patient has been treated with nebulizer and prednisone. We will continue nebulizer and  prednisone after discharge.  3. CKD is stable.  4. Dehydration is better after IV fluid support.  5. The patient also has a history of diastolic CHF, ejection fraction is 45%, which is stable.   The patient's symptoms have much improved. Her vital signs are stable. She is clinically stable and will be discharged to home with home health and PT. I discussed the patient's discharge plan with the patient, nurse, and case manager.   TIME SPENT: About 38 minutes.    ____________________________ Demetrios Loll, MD qc:JT D: 12/17/2014 10:55:55 ET T: 12/17/2014 11:59:47 ET JOB#: [id number]  cc: Demetrios Loll, MD, <Dictator> Demetrios Loll MD ELECTRONICALLY SIGNED 12/17/2014 12:35

## 2015-01-07 NOTE — H&P (Signed)
PATIENT NAME:  Mackenzie Rose, Mackenzie Rose MR#:  [id number] DATE OF BIRTH:  08/18/1946  DATE OF ADMISSION:  10/26/2014  REFERRING PHYSICIAN:   Gregor Hams, MD   PRIMARY CARE PRACTITIONER: Nonlocal.   ADMITTING DOCTOR: Juluis Mire, MD.   CHIEF COMPLAINT: Right upper quadrant pain with associated nausea, 1 day duration.   HISTORY OF PRESENT ILLNESS: A 69 year old Caucasian female with a history of hypertension, chronic obstructive pulmonary disease, myasthenia gravis, anxiety, bipolar disorder presents to the Emergency Room with the complaints of right upper quadrant pain with associated nausea of 1 day duration. The patient stated that she was in a reduced state of health yesterday.  She started having right upper quadrant pain with associated nausea, but no vomiting.  Hence, she came to the Emergency Room for further evaluation. Denies any history of any fever. No chills. No diarrhea and no constipation. Denies any chest pain. No shortness of breath and no cough. No dysuria, frequency, or urgency. In the Emergency Room, the patient was evaluated by the ED physician and was found to have elevated white blood cell count of 13,000, and further work-up with ultrasound of the abdomen revealed choledocholithiasis with acute cholecystitis. The patient was evaluated by general surgery in the Emergency Room and advised admission to the medical floor in view of multiple medical problems and also obtain a gastrointestinal consultation.  The patient was started on IV Ativan and currently after receiving the pain medications, her pain is under control.  She does not have any abdominal pain or nausea at the current time.    PAST MEDICAL HISTORY:  1.  Hypertension.  2.  Chronic obstructive pulmonary disease.  3.  Anxiety - bipolar disorder.  4.  Myasthenia gravis.   PAST SURGICAL HISTORY:  1.  Tubal ligation.  2.  Melanoma excision.  3.  Right eye surgery.   ALLERGIES: PENICILLIN, LITHIUM, LACTOSE.   FAMILY  HISTORY: Positive for coronary artery disease, diabetes, and stroke.   SOCIAL HISTORY: She is a resident of an assisted living place. She is a widow, walks with the help of a walker, history of smoking 1 pack per day. Denies any alcohol or substance abuse.    HOME MEDICATIONS:  1.  Advair Diskus 250/50 mcg 1 puff twice a day. 2.  Buspirone 5 mg tablet 1 tablet orally 3 times a day.  3.  Donepezil 5 mg tablet 1 tablet orally once a day.  4.  Lamotrigine 100 mg oral tablet 1 tablet orally 2 times a day.  5.  Losartan 50 mg tablet 1 tablet orally 2 times a day.  6.  Meclizine 25 mg tablet 1 tablet orally every 4 to 6 hours as needed.  7.  Multivitamin 1 tablet orally once a day.  8.  Pravastatin 20 mg 1 tablet orally once a day.  9.  Prednisone 20 mg 2 tablets orally every other day.  10. Pyridostigmine 60 mg oral tablet 1-1/2 tablets orally every 8 hours.  11. Quetiapine 200 mg oral tablet 1 tablet orally once a day at bedtime.  12. Sodium chloride nasal spray 2 sprays in each nostril as needed for congestion.  13. Tramadol 50 mg 1/2 tablet orally every 6 hours as needed.  14. Trazodone 50 mg tablet 1/2 tablet once a day at bedtime as needed for sleep.  15. Tylenol arthritis 650 mg extended release tablet, 1 tablet every 4 hours as needed for pain.   REVIEW OF SYSTEMS: CONSTITUTIONAL: Negative for fever or chills. No  fatigue. No generalized weakness.  EYES: Negative for blurred vision, double vision. No pain. No redness. No discharge.  EARS, NOSE, AND THROAT: Negative for tinnitus, ear pain, hearing loss, epistaxis, nasal discharge, difficulty swallowing.   RESPIRATORY: Negative for cough, wheezing, dyspnea, or PND, painful respiration.  CARDIOVASCULAR: Negative for chest pain, palpitation, dizziness, syncopal episodes, orthopnea, PND, non-exertional pedal edema.  GASTROINTESTINAL: Positive for right upper quadrant pain and associated nausea as noted in the history of present illness. No  vomiting, no diarrhea. No constipation. No hematemesis. No melena. No GERD symptoms.  GENITOURINARY: Negative for dysuria, frequency, urgency, or hematuria.  HEMATOLOGIC AND LYMPHATIC:  Negative for anemia, easy bruising or bleeding. INTEGUMENTARY:  Negative for acne, skin rash, or lesions.  MUSCULOSKELETAL: Negative for back pain, hip pain.  NEUROLOGICAL: Negative for focal weakness, numbness. No history of CVA, transient ischemic attack.  PSYCHIATRIC: Positive for anxiety and bipolar disorder, stable on home medications.   PHYSICAL EXAMINATION:  VITAL SIGNS: Temperature 98,, pulse rate 50, respirations 14, blood pressure 148/53, oxygen saturation 91% on room air.   GENERAL: Well-developed, well-nourished, alert and oriented.  In no acute distress, comfortably resting in the bed.  HEAD: Atraumatic, normocephalic.  EYES: Pupils are equal, react to light and accommodation. No conjunctival pallor. No icterus. Extraocular movements intact.  NOSE: No drainage. No lesions.  EARS: No drainage, no mucosal lesions. No exudates.  NECK: Supple. No JVD. No thyromegaly. No carotid bruit. Range of motion of the neck is within normal limits.  RESPIRATORY: Good respiratory effort. Not using accessory muscles of respiration.  bilateral vesicular breath sounds +; no rales or rhonchi.  CARDIOVASCULAR: S1, S2 regular. No murmurs, gallops, or clicks appreciated. Peripheral pulses equal at carotid, femoral, and pedal pulses. No peripheral lymphadenopathy, no peripheral edema.  GASTROINTESTINAL: Abdomen is soft and nontender. No hepatosplenomegaly. No masses. No rigidity. No guarding.  Bowel is present and equal in all 4 quadrants.  GENITOURINARY: Deferred.  MUSCULOSKELETAL: No joint tenderness. Range of motion adequate. Strength and tone equal bilaterally.  SKIN: Inspection within limits. There is a small spot of erythematous area with sore skin, left lower extremity present.   LYMPH NODES: No cervical  lymphadenopathy.  VASCULAR: Good dorsalis pedis and posterior pulses. NEUROLOGIC:  Alert, awake, and oriented x 3. Cranial nerves II through XII grossly intact. Motor strength is 5/5 in both upper and lower extremities.  Deep tendon reflexes 2+ bilaterally and symmetrical. No sensory deficit.   PSYCHIATRIC: Alert, awake, and oriented x 3. Judgment and insight adequate. Memory and mood within normal limits.   LABORATORY DATA: Serum glucose 172, BUN 25, creatinine 1.3, sodium 142, potassium 3.6, chloride 106, bicarbonate 31, total calcium 8.4, lipase 140, total protein 5.5, albumin 2.8, total bilirubin 0.7, alkaline phosphatase 104, AST 58, ALT 33. Troponin 0.03. WBC 13.3, hemoglobin 9.4, hematocrit 31.2, platelet count 145,000.   Right upper quadrant abdominal sonogram: 1.  Choledocholithiasis.  2.  Cholelithiasis with distention, mural thickening and tenderness consistent with acute cholecystitis.   EKG: Normal sinus rhythm with VR of 60 beats per minute, left atrial enlargement, right bundle branch block.   ASSESSMENT AND PLAN: A 69 year old Caucasian female, a resident of assisted living place with past medical history of hypertension, myasthenia gravis, chronic obstructive pulmonary disease, anxiety-bipolar disorder who presents with right upper quadrant abdominal pain with nausea, found to have choledocholithiasis with cholecystitis seen by general surgery in the Emergency Department and advised admission to medical floor with a gastrointestinal consultation and further surgical follow-up.  1.  Choledocholithiasis with acute cholecystitis. Admit to medical floor and keep her n.p.o., except medications, intravenous fluids, IV pain control medications and anti-nausea medications, IV antibiotics. Continue ertapenem, gastrointestinal consultation requested and general surgery follow-up.  2.  Hypertension, stable on home medications. Continue same. 3.  Myasthenia gravis, stable on home medications.  Continue same.  4.  Chronic obstructive pulmonary disease: Stable on inhalers. Continue same.  5.  Anxiety-bipolar disorder, stable on home medication.  Continue same.   6.  Deep vein thrombosis prophylaxis, subcutaneous Lovenox.  7.  Gastrointestinal prophylaxis: Proton pump inhibitor.  8.  CODE STATUS: FULL CODE.   TIME SPENT: 50 minutes.      ____________________________ Juluis Mire, MD enr:DT D: 10/26/2014 08:05:54 ET T: 10/26/2014 08:37:24 ET JOB#: [id number]  cc: Juluis Mire, MD, <Dictator> Juluis Mire MD ELECTRONICALLY SIGNED 10/26/2014 20:26

## 2015-01-07 NOTE — Discharge Summary (Signed)
PATIENT NAME:  Mackenzie Rose, Mackenzie Rose MR#:  [id number] DATE OF BIRTH:  Jul 06, 1946  DATE OF ADMISSION:  10/26/2014 DATE OF DISCHARGE:  10/29/2014  PRIMARY CARE PHYSICIAN: Nonlocal.   FINAL DIAGNOSES: 1.  Choledocholithiasis.  2.  Myasthenia gravis.  3.  Hypertension, essential.  4.  Chronic obstructive pulmonary disease.   DISCHARGE MEDICATIONS: Include lamotrigine 100 mg 1 tablet twice a day, pravastatin 20 mg daily, multivitamin 1 tablet daily, Advair Diskus 250/50 one puff every 12 hours, Seroquel 200 mg at bedtime, BuSpar 10 mg twice a day, losartan 50 mg daily, prednisone 10 mg 3-1/2 tablets every other day, pyridostigmine 60 mg every 8 hours, trazodone 50 mg at bedtime, Coreg 3.125 mg twice a day, Tylenol arthritis 1 tablet every 4 hours as needed, pink bismuth suspension 5 mL 4 times a day as needed for diarrhea, nausea and vomiting, Preparation H rectal cream apply rectally twice a day as needed, calcium and vitamin D 500 mg/400 international units 1 tablet daily, diltiazem ER 180 mg daily, Levaquin 500 mg every 24 hours for 6 days.   DISCHARGE DIET: Low fat, low cholesterol diet.   HOME HEALTH: Physical therapy to help with strength.   DISCHARGE FOLLOWUP: Follow up with Dr. Pat Patrick, surgery, in 2 weeks; Dr. Candace Cruise, gastroenterology, in 3 weeks.   HISTORY: The patient was admitted October 26, 2014 and discharged October 29, 2014. Came in with right upper quadrant pain and nausea. Was admitted with choledocholithiasis with suspected acute cholecystitis. The patient was admitted to the hospital, kept n.p.o., given IV fluid hydration, IV antibiotics. GI and surgical consultations were obtained. Dr. Candace Cruise, gastroenterology, performed an ERCP on October 27, 2014 which showed the entire main bile duct was moderately dilated, filling defect consistent with stones seen on cholangiogram. A sphincterotomy was performed. Biliary tree with swept. Choledocholithiasis was found. Complete removal was accomplished by  biliary sphincterotomy and balloon extraction.   DIAGNOSTIC DATA: During the hospital course included an EKG that showed normal sinus rhythm, left atrial enlargement, right bundle branch block.   Lipase 140. Glucose 172, BUN 25, creatinine 1.39, sodium 142, potassium 3.6, chloride 106, CO2 31, calcium 8.4. Total bilirubin 0.7, alkaline phosphatase 104, ALT 33, AST 58, total protein 5.5, albumin low at 2.8. White blood cell count 11.3, H and H 9.4 and 31.2, platelet count 145,000. Troponin negative.   Ultrasound of the abdomen was read by radiologist as choledocholithiasis, cholelithiasis with distention and mural thickening and tenderness which could be consistent with acute cholecystitis.  Upon discharge glucose 133, BUN 28, creatinine 1.43, sodium 142, potassium 3.7, chloride 108, CO2 29, calcium 8.6, total bilirubin 0.3, alkaline phosphatase 104, ALT 25, AST 20, total protein 5.1 and albumin 2.2.   HOSPITAL COURSE PER PROBLEM LIST:  1.  For the patient's choledocholithiasis, the patient was put on antibiotics, given IV fluids. An ERCP was done with good success, on October 27, 2014. Stone removal and sphincterotomy was performed. Surgery did see the patient. They do not believe that this was acute cholecystitis, believed that it was just choledocholithiasis. The patient can consider an outpatient cholecystectomy if she so desires. The complicating issue is the patient does have myasthenia gravis. Anesthesia can trigger a myasthenia crisis. She can follow up with her neurologist and discuss the risks of cholecystectomy. If she does have a problem again with the choledocholithiasis, at that point can consider cholecystectomy too. The patient did not want to take Actigall as outpatient stating that she does have quite a few other medications that  she is on.  2.  Myasthenia gravis. She was kept on her usual medications for this and was stable during the hospitalization.  3.  Essential hypertension.  Blood pressure stable. Continued her usual medications.  4.  COPD. No exacerbation on this time. Continued usual medications.   TIME SPENT ON DISCHARGE: 35 minutes.   ____________________________ Tana Conch. Leslye Peer, MD rjw:sb D: 10/30/2014 09:57:15 ET T: 10/30/2014 10:33:18 ET JOB#: [id number]  cc: Tana Conch. Leslye Peer, MD, <Dictator> Micheline Maze, MD Lupita Dawn. Candace Cruise, MD  Marisue Brooklyn MD ELECTRONICALLY SIGNED 10/30/2014 17:27

## 2015-01-07 NOTE — Consult Note (Signed)
General Aspect Primary Cardiologist: New to [facility] _______________  69 year old female with history of reported a-fib dating back to the 1970s (not on anticoagulation), myasthenia gravis, COPD, HTN, bipolar, dementia, and recent choledocholithiasis status post ERCP who presented to [facility] on 12/06/2014 with a 1 day history of nausea, vomiting, diarrhea, fever, chills, and was found to have UTI, possible PNA vs atlecetasis, and elevated troponin of 0.11-->0.23-->0.21. Cardiology was consulted for further evaluation.  ______________  PMH: 1. Reported a-fib dating back to the 1970s (not on anticoagulation) 2. Myasthenia gravis 3. COPD 4. HTN 5. Bipolar disorder 6. Dementia 7. Recent choledocholithiasis status post ERCP ______________   Present Illness 69 year old female with the above problem list who presented to [facility] on 12/06/2014 with the above CC. She has reported a-fib in [facility], never on anticoagulation. She also tells me she has suffered two self reported "mini-strokes," year unknown to her. She has previously undergone stress testing years ago for "routine evaluations." No prior cardiac caths.   She presented to [facility] on 3/30 with a 1 day history of nausea, vomiting, diarrhea, fever, and chills. Tmax 105. She also notes a cough that is productive of sputum. No chest pain. She does report some chest tightness with deep inspiration. No SOB, diaphoresis, palpitations, presyncope, or syncope. No dizziness. No abdominal pain. She denied any SOB but was noted to have a pulse ox of 88% upon her arrival to [facility] ED.   Upon her arrival to [facility] ED she was found to have a temperature of 104, be hypoxic with the above pulse ox, CXR showed possible atelectasis. Troponin 0.11-->0.23-->0.21. EKG showed sinus tachycardia, 126 bpm, RBBB, left anterior fascicular block, bifascicular block, PVCs with fusion complexes, no significant st/t changes. WBC count 15.8. UA showed possible UTI. She denied any dysuria but  did note urianry frequency and urgency in the ED.   She was started on vancomycin and Levaquin. Echo is pending. She continues to have intermittent diarrhea. No further nausea or vomiting.   Physical Exam:  GEN disheveled   HEENT PERRL, hearing intact to voice, dry oral mucosa   NECK supple  trachea midline  no JVD   RESP normal resp effort  rhonchi   CARD Tachycardic  No murmur   ABD denies tenderness  soft   EXTR 1+ pitting edema to the bilateral mid shins   SKIN normal to palpation   NEURO cranial nerves intact   PSYCH alert   Review of Systems:  General: Fatigue  Fever/chills  Weakness  weight loss   Skin: Rashes   ENT: No Complaints   Eyes: No Complaints   Neck: No Complaints   Respiratory: Frequent cough  Short of breath  Sputum   Cardiovascular: Tightness  Edema   Gastrointestinal: Nausea  Diarrhea   Genitourinary: Frequent urination   Vascular: No Complaints   Musculoskeletal: No Complaints   Neurologic: No Complaints   Hematologic: No Complaints   Endocrine: No Complaints   Psychiatric: No Complaints   Review of Systems: All other systems were reviewed and found to be negative   Medications/Allergies Reviewed Medications/Allergies reviewed   Family & Social History:  Family and Social History:  Family History father: CAD s/p MI 76; mother passed from hernia   Social History negative ETOH, negative Illicit drugs   + Tobacco Prior (greater than 1 year)   Place of Living Nursing Home     Hypertension:    Obsessive-Compulsive Disorder:    COPD:  Anxiety:    Bi Polar:    Myasthenia Gravis:    Tubal Ligation:    melanoma removal from right arm:    Eye Surgery - Right: retina repair  Home Medications: Medication Instructions Status  traMADol 50 mg oral tablet 1 tab(s) orally 2 times a day Active  donepezil 10 mg oral tablet 1 tab(s) orally once a day (at bedtime) Active  Deep Sea Nasal Spray 0.65% nasal spray 2  spray(s) in each nostril once a day Active  guaiFENesin 100 mg/5 mL oral liquid 5 milliliter(s) orally 2 times a day, As Needed for cough Active  Voltaren Topical 1% topical gel Apply topically to affected area 2 times a day Active  pravastatin 20 mg oral tablet 1 tab(s) orally once a day (8 am) Active  multivitamin 1 tab(s) orally once a day (8 am) Active  Advair Diskus 250 mcg-50 mcg inhalation powder 1 puff(s) inhaled every 12 hours (8 am, 8 pm) Active  QUEtiapine 200 mg oral tablet 1 tab(s) orally once a day (at bedtime) Active  busPIRone 10 mg oral tablet 1 tab(s) orally 2 times a day Active  losartan 50 mg oral tablet 1 tab(s) orally once a day Active  predniSONE 10 mg oral tablet 3.5 tab(s) orally every other day Active  pyridostigmine 60 mg oral tablet 1 tab(s) orally every 8 hours Active  traZODone 50 mg oral tablet 1 tab(s) orally once a day (at bedtime), As Needed Active  carvedilol 3.125 mg oral tablet 1 tab(s) orally 2 times a day Active  Tylenol Arthritis Extended Release 650 mg oral tablet, extended release 1 tab(s) orally every 4 hours, As Needed Active  Pink Bismuth 262 mg/15 mL oral suspension 5 milliliter(s) orally 4 times a day, As Needed - for Diarrhea, for Nausea, Vomiting  Active  Preparation H Cream 0.25%-1% rectal cream 1 application rectal 2 times a day, As Needed Active  Calcium 500+D 500 mg-400 intl units oral tablet, chewable 1 tab(s) orally once a day Active  diltiazem 180 mg/24 hours oral capsule, extended release 1 cap(s) orally once a day Active  lamoTRIgine 100 mg oral tablet 1 tab(s) orally 2 times a day (8 am, 8 pm) Active   Lab Results:  Hepatic:  30-Mar-16 22:08   Bilirubin, Total 0.6 (0.3-1.2 NOTE: New Reference Range  11/14/14)  Alkaline Phosphatase 67 (38-126 NOTE: New Reference Range  11/14/14)  SGPT (ALT)  11 (14-54 NOTE: New Reference Range  11/14/14)  SGOT (AST) 26 (15-41 NOTE: New Reference Range  11/14/14)  Total Protein, Serum  6.1  (6.5-8.1 NOTE: New Reference Range  11/14/14)  Albumin, Serum  3.3 (3.5-5.0 NOTE: New reference range  11/14/14)  Routine Micro:  30-Mar-16 22:08   Micro Text Report BLOOD CULTURE   COMMENT                   NO GROWTH IN 8-12 HOURS   ANTIBIOTIC                       Culture Comment NO GROWTH IN 8-12 HOURS  Result(s) reported on 07 Dec 2014 at 06:00AM.    22:17   Micro Text Report BLOOD CULTURE   COMMENT                   NO GROWTH IN 8-12 HOURS   ANTIBIOTIC  Culture Comment NO GROWTH IN 8-12 HOURS  Result(s) reported on 07 Dec 2014 at 06:00AM.  Lab:  30-Mar-16 22:40   pH (ABG) 7.440 (7.350-7.450 NOTE: New Reference Range 04/01/14)  PCO2 36 (32-48 NOTE: New Reference Range 04/18/14)  PO2  81 (83-108 NOTE: New Reference Range 04/01/14)  FiO2 28  HCO3 24.2 (21.0-28.0 NOTE: New Reference Range 04/01/14)  Routine Chem:  30-Mar-16 22:08   Result Comment - TROPONIN  - CALLED TO KATIE NEWSHOLME  - @ 2620 12-06-14 BY AJO  - READ-BACK PERFORMED  Result(s) reported on 06 Dec 2014 at 10:45PM.  Glucose, Serum  115 (65-99 NOTE: New Reference Range  11/14/14)  BUN 20 (6-20 NOTE: New Reference Range  11/14/14)  Creatinine (comp)  1.51 (0.44-1.00 NOTE: New Reference Range  11/14/14)  Sodium, Serum 141 (135-145 NOTE: New Reference Range  11/14/14)  Potassium, Serum  3.2 (3.5-5.1 NOTE: New Reference Range  11/14/14)  Chloride, Serum 105 (101-111 NOTE: New Reference Range  11/14/14)  CO2, Serum 27 (22-32 NOTE: New Reference Range  11/14/14)  Calcium (Total), Serum 8.9 (8.9-10.3 NOTE: New Reference Range  11/14/14)  eGFR (African American)  40  eGFR (Non-African American)  34 (eGFR values <78m/min/1.73 m2 may be an indication of chronic kidney disease (CKD). Calculated eGFR is useful in patients with stable renal function. The eGFR calculation will not be reliable in acutely ill patients when serum creatinine is changing rapidly. It is not useful  in patients on dialysis. The eGFR calculation may not be applicable to patients at the low and high extremes of body sizes, pregnant women, and vegetarians.)  Anion Gap 9  Magnesium, Serum 1.8 (1.7-2.4 THERAPEUTIC RANGE: 4-7 mg/dL TOXIC: > 10 mg/dL  ----------------------- NOTE: New Reference Range  11/14/14)  Phosphorus, Serum 2.6 (2.5-4.6 NOTE: New Reference Range  11/14/14)    22:17   Lactic Acid  Venous 2.0 (0.5-2.0 NOTE: New Reference Range:  11/14/14)  Cardiac:  30-Mar-16 22:08   Troponin I  0.12 (0.00-0.03 0.03 ng/mL or less: NEGATIVE  Repeat testing in 3-6 hrs  if clinically indicated. >0.05 ng/mL: POTENTIAL  MYOCARDIAL INJURY. Repeat  testing in 3-6 hrs if  clinically indicated. NOTE: An increase or decrease  of 30% or more on serial  testing suggests a  clinically important change NOTE: New Reference Range  11/14/14)  31-Mar-16 02:25   Troponin I  0.23 (0.00-0.03 0.03 ng/mL or less: NEGATIVE  Repeat testing in 3-6 hrs  if clinically indicated. >0.05 ng/mL: POTENTIAL  MYOCARDIAL INJURY. Repeat  testing in 3-6 hrs if  clinically indicated. NOTE: An increase or decrease  of 30% or more on serial  testing suggests a  clinically important change NOTE: New Reference Range  11/14/14)    06:03   Troponin I  0.21 (0.00-0.03 0.03 ng/mL or less: NEGATIVE  Repeat testing in 3-6 hrs  if clinically indicated. >0.05 ng/mL: POTENTIAL  MYOCARDIAL INJURY. Repeat  testing in 3-6 hrs if  clinically indicated. NOTE: An increase or decrease  of 30% or more on serial  testing suggests a  clinically important change NOTE: New Reference Range  11/14/14)  Routine UA:  30-Mar-16 22:24   Color (UA) Yellow  Clarity (UA) Clear  Glucose (UA) 50 mg/dL  Bilirubin (UA) Negative  Ketones (UA) Negative  Specific Gravity (UA) 1.009  Blood (UA) 1+  pH (UA) 6.0  Protein (UA) 100 mg/dL  Nitrite (UA) Negative  Leukocyte Esterase (UA) Negative (Result(s) reported on 07 Dec 2014 at 12:09AM.)  RBC (UA)  2 /HPF  WBC (UA) 5 /HPF  Bacteria (UA) TRACE  Epithelial Cells (UA) NONE SEEN  Result(s) reported on 07 Dec 2014 at 12:09AM.  Routine Coag:  30-Mar-16 22:08   Prothrombin 14.2 (11.4-15.0 NOTE: New Reference Range  10/06/14)  INR 1.1 (INR reference interval applies to patients on anticoagulant therapy. A single INR therapeutic range for coumarins is not optimal for all indications; however, the suggested range for most indications is 2.0 - 3.0. Exceptions to the INR Reference Range may include: Prosthetic heart valves, acute myocardial infarction, prevention of myocardial infarction, and combinations of aspirin and anticoagulant. The need for a higher or lower target INR must be assessed individually. Reference: The Pharmacology and Management of the Vitamin K  antagonists: the seventh ACCP Conference on Antithrombotic and Thrombolytic Therapy. FOYDX.4128 Sept:126 (3suppl): [id number]. A HCT value >55% may artifactually increase the PT.  In one study,  the increase was an average of 25%. Reference:  "Effect on Routine and Special Coagulation Testing Values of Citrate Anticoagulant Adjustment in Patients with High HCT Values." American Journal of Clinical Pathology 2006;126:400-405.)  Routine Hem:  30-Mar-16 22:08   WBC (CBC)  15.8  RBC (CBC) 4.28  Hemoglobin (CBC)  10.1  Hematocrit (CBC)  32.6  Platelet Count (CBC) 159  MCV  76  MCH  23.5  MCHC  30.9  RDW  17.8  Neutrophil % 97.8  Lymphocyte % 0.6  Monocyte % 1.3  Eosinophil % 0.0  Basophil % 0.3  Neutrophil #  15.4  Lymphocyte #  0.1  Monocyte # 0.2  Eosinophil # 0.0  Basophil # 0.0 (Result(s) reported on 06 Dec 2014 at 10:36PM.)   EKG:  EKG Interp. by me   Interpretation sinus tachycardia, 126 bpm, RBBB, left anterior fascicular block (bifascicular block), no significant st/t changes   Radiology Results: XRay:    30-Mar-16 22:39, Chest Portable Single View  Chest Portable Single  View   REASON FOR EXAM:    Sepsis  COMMENTS:       PROCEDURE: DXR - DXR PORTABLE CHEST SINGLE VIEW  - Dec 06 2014 10:39PM     CLINICAL DATA:  Fever, nausea, vomiting and diarrhea.  Sepsis.    EXAM:  PORTABLE CHEST - 1 VIEW    COMPARISON:  Frontal and lateral views 12/09/2013    FINDINGS:  Heart is mildly enlarged, unchanged from prior exam. There is a  probable hiatal hernia. Linear right basilar opacities, likely  atelectasis. Calcified granuloma again seen in the left upper lung.  No confluent consolidation, pleural effusion, or pneumothorax. No  acute osseous abnormalities are seen.     IMPRESSION:  Linear right basilar opacities, likely atelectasis.      Electronically Signed    By: Jeb Levering M.D.    On: 12/06/2014 23:07         Verified By: Rollene Fare. Marisue Humble, M.D.,    Penicillin: Rash  Lithium: Other  Lactose: Other  Vital Signs/Nurse's Notes: **Vital Signs.:   31-Mar-16 01:53  Vital Signs Type Admission  Temperature Temperature (F) 102.5  Celsius 39.1  Temperature Source oral  Pulse Pulse 114  Respirations Respirations 18  Systolic BP Systolic BP 786  Diastolic BP (mmHg) Diastolic BP (mmHg) 76  Mean BP 91  Pulse Ox % Pulse Ox % 95  Pulse Ox Activity Level  At rest  Oxygen Delivery 2L    Impression 69 year old female with history of reported a-fib dating back to the 1970s (not on anticoagulation), myasthenia gravis,  COPD, HTN, bipolar, dementia, and recent choledocholithiasis status post ERCP who presented to [facility] on 12/06/2014 with a 1 day history of nausea, vomiting, diarrhea, fever, chills, and was found to have UTI, possible PNA vs atlecetasis, and elevated troponin of 0.11-->0.23-->0.21. Cardiology was consulted for further evaluation.   1. Elevated troponin: -Likely 2/2 supply demand ischemia in the setting of gastroenteritis vs possible UTI with sepsis and atelectasis -Check echo to evaluate LV function and wall motion  -Prior stress  testing years ago for uncertain etiology, no previous cardiac caths -If echo is normal could pursue outpatient Lexiscan Myoview -Continue aspirin 81 mg daily  2. Reported history of a-fib in Epic PMH: -No mention of chronicity in any notes -She is currently in NSR on telemetry -Would hold any long term anticoagulation at this time as we do not have objective evidence stating she has a-fib -Should she develop palpitations could place an outpatient monitor on her for further evaluation  3. Gastroenteritis, fever, leukocytosis, UTI: -On Levaquin and vancomycin -Blood cultures pending and negative  4. HTN: -Controlled -Continue current management to include diltiazem 180 mg daily and Coreg 3.125 mg bid  5. Myasthenia gravis: -Continue home treatment  6. COPD: -On Levaquin and inhalers  7. Dementia   Electronic Signatures for Addendum Section:  Kathlyn Sacramento (MD) (Signed Addendum 31-Mar-16 17:33)  The patient was seen and examined. Agree with the above. She has no previous cardiac history but multiple risk facotrs for CAD. She presented with high grade fever and UTI. Was found to have mildly elevated TnI. No murmurs by exam. ECG with no ischemic changes.  echo showed an EF of 45-50% with gobal hypokinesis.  I suspect supply demand ischemia. recommend a lexiscan myoview before discharge or as an outpatient.   Electronic Signatures: Kathlyn Sacramento (MD)  (Signed 31-Mar-16 17:33)  Co-Signer: General Aspect/Present Illness, History and Physical Exam, Review of System, Family & Social History, Past Medical History, Home Medications, Labs, EKG , Radiology, Allergies, Vital Signs/Nurse's Notes, Impression/Plan Idolina Primer, Zitlali Primm M (PA-C)  (Signed 31-Mar-16 10:18)  Authored: General Aspect/Present Illness, History and Physical Exam, Review of System, Family & Social History, Past Medical History, Home Medications, Labs, EKG , Radiology, Allergies, Vital Signs/Nurse's Notes, Impression/Plan   Last  Updated: 31-Mar-16 17:33 by Kathlyn Sacramento (MD)

## 2015-03-16 ENCOUNTER — Ambulatory Visit (INDEPENDENT_AMBULATORY_CARE_PROVIDER_SITE_OTHER): Payer: Medicare Other | Admitting: Neurology

## 2015-03-16 ENCOUNTER — Encounter: Payer: Self-pay | Admitting: Neurology

## 2015-03-16 VITALS — BP 138/80 | HR 71 | Ht 63.0 in | Wt 135.6 lb

## 2015-03-16 DIAGNOSIS — G7000 Myasthenia gravis without (acute) exacerbation: Secondary | ICD-10-CM | POA: Diagnosis not present

## 2015-03-16 MED ORDER — PREDNISONE 10 MG PO TABS: 30.0000 mg | ORAL_TABLET | ORAL | Status: DC

## 2015-03-16 NOTE — Patient Instructions (Addendum)
1.  Reduce prednisone to 30mg  every other day.  If there is any new worsening of weakness, ptosis, dysphagia, or diplopia, go back to prednisone 35mg  every other day 2.  Continue mestinon 60mg  three times daily 3.  Return to clinic 61-months

## 2015-03-16 NOTE — Addendum Note (Signed)
Addended by: Chester Holstein on: 03/16/2015 11:47 AM   Modules accepted: Orders, Medications

## 2015-03-16 NOTE — Progress Notes (Signed)
Follow-up Visit   Date: 03/16/2015     Mackenzie Rose MRN: [id number] DOB: [birth year]   Interim History: Mackenzie Rose is a 69 y.o. left-handed Caucasian female with history of myasthenia gravis (diagnosed 2009), hypertension, COPD, dementia, left flail foot, and bipolar depression returning to the clinic for follow-up of myasthenia gravis.  The patient was accompanied to the clinic by caregiver who also provides collateral information.    History of present illness: She developed bilateral ptosis and double vision in ~2006 and was eventually referred to neurology ([name] in [place], [place] with [facility]) in 2009 where she was diagnosed with myasthenia gravis. She was initially started on prednisone which seems to keep her symptoms stable. She has been on chronic steroids since 2013, but the lowest dose being prednisone 30 mg every other day.  She moved to [facility] in September 2015 to be closer to her sister and currently resides in an assisted living facility.   She saw Dr. [name] in January 2015 for MG who referred her to see me. At her initial visit, she complained of seeing double vision, rare spells of difficulty swallowing solids, and having problems with hand dexterity. She has been on prednisone 40mg  QOD and mestinon 90mg  TID since fall of 2014. Her last steroid adjustment was tapering it the dose. She has never been hospitalized with myasthenia gravis exacerbation, developed respiratory insufficiency, or received IVIG/plasmapheresis. She has not tried medications other than prednisone and mestinon. She does not recall her highest dose of prednisone. Her symptoms are all worsened with stress and if she is calm, it is much better.   Of note, she has left foot drop which started in the fall of 2014. She had previously had imaging for this and is having PT, but I am not sure what has been done.   - Follow-up 02/28/2014:  She reports having difficulty with  double vision and hand dexterity, which is worse when she is stressed.  Swallowing has improved.  Denies any limb weakness or shortness of breath.  She also complains of photosensitivity.  She is requesting to be part of any clinical trials for MG.  - Follow-up 07/14/2014:  She is feeling better because her vision and walking is improved.   Appointment at [facility] was scheduled, but patient was a no show.  Prednisone reduced to 35mg  QOD.  - UPDATE 09/22/2014:  She comes with a notebook of complaints:  She complains of right hand numbness over the ulnar distribution.  Her vision is improved and she denies any double vision. Neck and handwriting is also doing much better.  She complains of photosensitivity and teeth problems.  Swallowing is good, except occasional problems with pills.  Prednisone reduced to 30mg  QOD.  - UPDATE 03/16/2015:  She had an interval hospitalization in April for shortness of breath secondary to pneumonia and COPD exacerbation.  She was on prednisone to 30mg  every other day but after her hospitalization, it was increased back to prednisone 35mg  QOD. No new weakness, double vision, jaw fatigue, or dysphagia. Eye are droopy at baseline, but no worsening. She feels that her myasthenia gravis is in remission. Her left leg weakness is also significantly improved and she is walking independently, but uses a rollator for long distances.  No new complaints.     Medications:  Current Outpatient Prescriptions on File Prior to Visit  Medication Sig Dispense Refill  . acetaminophen (TYLENOL) 650 MG CR tablet Take 650 mg by mouth every  4 (four) hours as needed for pain.    Marland Kitchen alendronate (FOSAMAX) 70 MG tablet Take 70 mg by mouth once a week. Take with a full glass of water on an empty stomach.    Marland Kitchen amLODipine (NORVASC) 5 MG tablet Take 5 mg by mouth daily.    . busPIRone (BUSPAR) 5 MG tablet Take 5 mg by mouth 3 (three) times daily.    . Calcium Carbonate-Vitamin D (CALCIUM  500 + D PO) Take by mouth.    . carvedilol (COREG) 3.125 MG tablet Take 3.125 mg by mouth 2 (two) times daily with a meal.    . diclofenac sodium (VOLTAREN) 1 % GEL Apply topically 4 (four) times daily.    Marland Kitchen diltiazem (CARDIZEM CD) 180 MG 24 hr capsule Take 180 mg by mouth daily.    Marland Kitchen donepezil (ARICEPT) 10 MG tablet Take 10 mg by mouth at bedtime.    . Fluticasone-Salmeterol (ADVAIR) 250-50 MCG/DOSE AEPB Inhale 1 puff into the lungs 2 (two) times daily.    Marland Kitchen glucagon 1 MG injection Inject 1 mg into the vein once as needed.    Marland Kitchen ibuprofen (ADVIL,MOTRIN) 400 MG tablet Take 400 mg by mouth every 6 (six) hours as needed.    . lamoTRIgine (LAMICTAL) 100 MG tablet Take 100 mg by mouth 2 (two) times daily.    . Multiple Vitamin (MULTIVITAMIN) tablet Take 1 tablet by mouth daily.    . pravastatin (PRAVACHOL) 20 MG tablet Take 20 mg by mouth daily.    Marland Kitchen pyridostigmine (MESTINON) 60 MG tablet Take 60 mg by mouth 3 (three) times daily. Take 1 1/2 tablets by mouth q 8 hours.    Marland Kitchen QUEtiapine (SEROQUEL) 200 MG tablet Take 200 mg by mouth at bedtime.    . sodium chloride (OCEAN) 0.65 % SOLN nasal spray Place 2 sprays into both nostrils.    . traMADol (ULTRAM) 50 MG tablet Take 50 mg by mouth every 6 (six) hours as needed. Take 1/2 tablet q 6 hours prn    . traZODone (DESYREL) 50 MG tablet Take 50 mg by mouth at bedtime. Take 1/2 at bedtime.     No current facility-administered medications on file prior to visit.    Allergies:  Allergies  Allergen Reactions  . Lactose   . Lithium   . Penicillins Rash     Review of Systems:  CONSTITUTIONAL: No fevers, chills, night sweats, or weight loss.   EYES: No visual changes or eye pain ENT: No hearing changes. No history of nose bleeds.   RESPIRATORY: No cough, wheezing and shortness of breath.   CARDIOVASCULAR: Negative for chest pain, and palpitations.   GI: Negative for abdominal discomfort, blood in stools or black stools.  No recent change in bowel  habits.   GU:  No history of incontinence.   MUSCLOSKELETAL: No history of joint pain or swelling.  No myalgias.   SKIN: Negative for lesions, rash, and itching.   ENDOCRINE: Negative for cold or heat intolerance, polydipsia or goiter.   PSYCH:  + depression or anxiety symptoms.   NEURO: As Above.   Vital Signs:  BP 138/80 mmHg  Pulse 71  Ht 5\' 3"  (1.6 m)  Wt 135 lb 9 oz (61.491 kg)  BMI 24.02 kg/m2  SpO2 99%   Neurological Exam: MENTAL STATUS including orientation to time, place, person, recent and remote memory, attention span and concentration, language, and fund of knowledge is normal. She is slightly anxious appearing. Speech is not dysarthric.  CRANIAL NERVES:  Pupils equal round and reactive to light.  Normal conjugate, extra-ocular eye movements in all directions of gaze.  Mild bilateral ptosis at baseline and with sustained upward.  Facial muscles are intact - orbicularis oculi, buccinators, frontalis, orbicularis oris is 5/5. Normal facial sensation.  Face is symmetric. Palate elevates symmetrically.  Tongue strength is intact.  MOTOR:  Neck flexion and extension is 5/5.  Motor strength is 5/5 in all extremities, including ADM, left dorsiflexion 5-/5, plantar flexion 5/5, toe extension 5-/5, and toe flexion 5-/5.  Left foot inversion and eversion is 5/5.  MSRs:  Right      Left  brachioradialis  2+   brachioradialis  2+   biceps  2+   biceps  2+   triceps  2+   triceps  2+   patellar  3+   patellar  3+   ankle jerk  0   ankle jerk  0    SENSORY: Normal and symmetric perception of light touch and vibration.    COORDINATION/GAIT:   Gait is improved and does not show high steppage gait on left, it is slow and stable.  Internal Data: Labs 11/22/2013:  MG panel - negative  External data:  CT head without contrast 12/21/2012: Evidence of prior infarcts and white matter changes. No acute abnormalities   MRI thoracic spine without contrast 04/21/2013: Recent or active  compression fracture at L1 with retropulsed fragment and anterior indentation of the thecal sac. Multilevel degenerative disc disease seen in the thoracic spine without evidence of neural impingement.   MRI cervical spine without contrast 11/24/2012: There is mild neural foramen stenosis bilaterally at C5-6, but no central spinal stenosis. 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   IMPRESSION: 1.  Myasthenia gravis without exacerbation, stable   - Diagnosed in 2009 and manifested with diplopia and ptosis.   - No evidence of fatigability or weakness exam today, despite tapering her prednisone to 35mg  qod at her last visit  - Since she is doing well, I will continue slow taper with her prednisone to 30mg  qod  - Risk of long term steroid use discussed and will consider steroid-sparing agent going forward if unable to reduce prednisone.    2.  Right ulnar neuropathy at the elbow, stable  3.  Left flail foot, clinically much improved!!  Most likely due to L5-S1 radiculopathy and doing much better with PT  4.  Bipolar depression, followed by psychiatry   PLAN/RECOMMENDATIONS:  Reduce prednisone to 30mg  every other day, if she develops any new weakness, increase back to 35mg  QOD Continue mestinon 60mg  three times daily Return to clinic in 68-months or sooner as needed   The duration of this appointment visit was 25 minutes of face-to-face time with the patient.  Greater than 50% of this time was spent in counseling, explanation of diagnosis, planning of further management, and coordination of care.   Thank you for allowing me to participate in patient's care.  If I can answer any additional questions, I would be pleased to do so.    Sincerely,    Donika K. Posey Pronto, DO

## 2015-03-25 ENCOUNTER — Observation Stay
Admission: EM | Admit: 2015-03-25 | Discharge: 2015-03-26 | Payer: Medicare Other | Attending: Internal Medicine | Admitting: Internal Medicine

## 2015-03-25 ENCOUNTER — Observation Stay
Admit: 2015-03-25 | Discharge: 2015-03-25 | Disposition: A | Discharge status: Home / Self Care | Payer: Medicare Other | Attending: Internal Medicine | Admitting: Internal Medicine

## 2015-03-25 ENCOUNTER — Encounter: Payer: Self-pay | Admitting: Internal Medicine

## 2015-03-25 ENCOUNTER — Emergency Department: Payer: Medicare Other

## 2015-03-25 DIAGNOSIS — J449 Chronic obstructive pulmonary disease, unspecified: Secondary | ICD-10-CM | POA: Insufficient documentation

## 2015-03-25 DIAGNOSIS — M359 Systemic involvement of connective tissue, unspecified: Secondary | ICD-10-CM | POA: Insufficient documentation

## 2015-03-25 DIAGNOSIS — K449 Diaphragmatic hernia without obstruction or gangrene: Secondary | ICD-10-CM | POA: Insufficient documentation

## 2015-03-25 DIAGNOSIS — I452 Bifascicular block: Secondary | ICD-10-CM | POA: Diagnosis not present

## 2015-03-25 DIAGNOSIS — G7000 Myasthenia gravis without (acute) exacerbation: Secondary | ICD-10-CM | POA: Insufficient documentation

## 2015-03-25 DIAGNOSIS — R05 Cough: Secondary | ICD-10-CM | POA: Diagnosis present

## 2015-03-25 DIAGNOSIS — E876 Hypokalemia: Secondary | ICD-10-CM

## 2015-03-25 DIAGNOSIS — E860 Dehydration: Secondary | ICD-10-CM | POA: Diagnosis not present

## 2015-03-25 DIAGNOSIS — I517 Cardiomegaly: Secondary | ICD-10-CM | POA: Insufficient documentation

## 2015-03-25 DIAGNOSIS — F0390 Unspecified dementia without behavioral disturbance: Secondary | ICD-10-CM | POA: Diagnosis not present

## 2015-03-25 DIAGNOSIS — I10 Essential (primary) hypertension: Secondary | ICD-10-CM | POA: Insufficient documentation

## 2015-03-25 DIAGNOSIS — F419 Anxiety disorder, unspecified: Secondary | ICD-10-CM | POA: Diagnosis not present

## 2015-03-25 DIAGNOSIS — Z880 Allergy status to penicillin: Secondary | ICD-10-CM | POA: Diagnosis not present

## 2015-03-25 DIAGNOSIS — R55 Syncope and collapse: Secondary | ICD-10-CM | POA: Diagnosis not present

## 2015-03-25 DIAGNOSIS — R7309 Other abnormal glucose: Secondary | ICD-10-CM | POA: Insufficient documentation

## 2015-03-25 DIAGNOSIS — R531 Weakness: Secondary | ICD-10-CM | POA: Diagnosis not present

## 2015-03-25 DIAGNOSIS — I4891 Unspecified atrial fibrillation: Secondary | ICD-10-CM | POA: Insufficient documentation

## 2015-03-25 DIAGNOSIS — E782 Mixed hyperlipidemia: Secondary | ICD-10-CM | POA: Insufficient documentation

## 2015-03-25 DIAGNOSIS — E780 Pure hypercholesterolemia: Secondary | ICD-10-CM | POA: Insufficient documentation

## 2015-03-25 DIAGNOSIS — R059 Cough, unspecified: Secondary | ICD-10-CM

## 2015-03-25 DIAGNOSIS — R42 Dizziness and giddiness: Secondary | ICD-10-CM | POA: Insufficient documentation

## 2015-03-25 DIAGNOSIS — J441 Chronic obstructive pulmonary disease with (acute) exacerbation: Secondary | ICD-10-CM | POA: Diagnosis not present

## 2015-03-25 DIAGNOSIS — Z888 Allergy status to other drugs, medicaments and biological substances status: Secondary | ICD-10-CM | POA: Diagnosis not present

## 2015-03-25 DIAGNOSIS — R0602 Shortness of breath: Secondary | ICD-10-CM | POA: Diagnosis not present

## 2015-03-25 DIAGNOSIS — I959 Hypotension, unspecified: Secondary | ICD-10-CM | POA: Insufficient documentation

## 2015-03-25 DIAGNOSIS — Z91011 Allergy to milk products: Secondary | ICD-10-CM | POA: Diagnosis not present

## 2015-03-25 DIAGNOSIS — F17210 Nicotine dependence, cigarettes, uncomplicated: Secondary | ICD-10-CM | POA: Diagnosis not present

## 2015-03-25 DIAGNOSIS — F319 Bipolar disorder, unspecified: Secondary | ICD-10-CM | POA: Insufficient documentation

## 2015-03-25 DIAGNOSIS — Z85820 Personal history of malignant melanoma of skin: Secondary | ICD-10-CM | POA: Insufficient documentation

## 2015-03-25 DIAGNOSIS — R778 Other specified abnormalities of plasma proteins: Secondary | ICD-10-CM

## 2015-03-25 DIAGNOSIS — R7989 Other specified abnormal findings of blood chemistry: Secondary | ICD-10-CM

## 2015-03-25 DIAGNOSIS — N179 Acute kidney failure, unspecified: Secondary | ICD-10-CM | POA: Insufficient documentation

## 2015-03-25 DIAGNOSIS — R748 Abnormal levels of other serum enzymes: Secondary | ICD-10-CM | POA: Diagnosis not present

## 2015-03-25 LAB — CBC WITH DIFFERENTIAL/PLATELET
Basophils Absolute: 0.1 10*3/uL (ref 0–0.1)
Basophils Relative: 1 %
Eosinophils Absolute: 0 10*3/uL (ref 0–0.7)
Eosinophils Relative: 0 %
HEMATOCRIT: 28.5 % — AB (ref 35.0–47.0)
Hemoglobin: 8.8 g/dL — ABNORMAL LOW (ref 12.0–16.0)
LYMPHS PCT: 10 %
Lymphs Abs: 1.1 10*3/uL (ref 1.0–3.6)
MCH: 23.2 pg — ABNORMAL LOW (ref 26.0–34.0)
MCHC: 30.8 g/dL — ABNORMAL LOW (ref 32.0–36.0)
MCV: 75.6 fL — ABNORMAL LOW (ref 80.0–100.0)
MONO ABS: 0.6 10*3/uL (ref 0.2–0.9)
Monocytes Relative: 5 %
NEUTROS ABS: 10.2 10*3/uL — AB (ref 1.4–6.5)
NEUTROS PCT: 84 %
Platelets: 185 10*3/uL (ref 150–440)
RBC: 3.77 MIL/uL — AB (ref 3.80–5.20)
RDW: 18.4 % — ABNORMAL HIGH (ref 11.5–14.5)
WBC: 12.1 10*3/uL — AB (ref 3.6–11.0)

## 2015-03-25 LAB — TROPONIN I
TROPONIN I: 0.08 ng/mL — AB (ref <0.031)
Troponin I: 0.07 ng/mL — ABNORMAL HIGH (ref <0.031)
Troponin I: 0.06 ng/mL — ABNORMAL HIGH (ref <0.031)
Troponin I: 0.05 ng/mL — ABNORMAL HIGH (ref <0.031)

## 2015-03-25 LAB — URINALYSIS COMPLETE WITH MICROSCOPIC (ARMC ONLY)
Bilirubin Urine: NEGATIVE
Glucose, UA: NEGATIVE mg/dL
Hgb urine dipstick: NEGATIVE
KETONES UR: NEGATIVE mg/dL
LEUKOCYTES UA: NEGATIVE
Nitrite: NEGATIVE
Protein, ur: 30 mg/dL — AB
Specific Gravity, Urine: 1.017 (ref 1.005–1.030)
pH: 5 (ref 5.0–8.0)

## 2015-03-25 LAB — HEMOGLOBIN A1C: Hgb A1c MFr Bld: 6.5 % — ABNORMAL HIGH (ref 4.0–6.0)

## 2015-03-25 LAB — BASIC METABOLIC PANEL
Anion gap: 7 (ref 5–15)
BUN: 29 mg/dL — ABNORMAL HIGH (ref 6–20)
CO2: 27 mmol/L (ref 22–32)
CREATININE: 1.54 mg/dL — AB (ref 0.44–1.00)
Calcium: 7.8 mg/dL — ABNORMAL LOW (ref 8.9–10.3)
Chloride: 97 mmol/L — ABNORMAL LOW (ref 101–111)
GFR calc non Af Amer: 33 mL/min — ABNORMAL LOW (ref >60)
GFR, EST AFRICAN AMERICAN: 38 mL/min — AB (ref >60)
Glucose, Bld: 241 mg/dL — ABNORMAL HIGH (ref 65–99)
Potassium: 4.1 mmol/L (ref 3.5–5.1)
SODIUM: 131 mmol/L — AB (ref 135–145)

## 2015-03-25 LAB — COMPREHENSIVE METABOLIC PANEL
ALBUMIN: 3.3 g/dL — AB (ref 3.5–5.0)
ALK PHOS: 68 U/L (ref 38–126)
ALT: 14 U/L (ref 14–54)
ANION GAP: 11 (ref 5–15)
AST: 32 U/L (ref 15–41)
BILIRUBIN TOTAL: 0.3 mg/dL (ref 0.3–1.2)
BUN: 30 mg/dL — AB (ref 6–20)
CHLORIDE: 94 mmol/L — AB (ref 101–111)
CO2: 27 mmol/L (ref 22–32)
CREATININE: 2.06 mg/dL — AB (ref 0.44–1.00)
Calcium: 8.4 mg/dL — ABNORMAL LOW (ref 8.9–10.3)
GFR, EST AFRICAN AMERICAN: 27 mL/min — AB (ref >60)
GFR, EST NON AFRICAN AMERICAN: 23 mL/min — AB (ref >60)
Glucose, Bld: 185 mg/dL — ABNORMAL HIGH (ref 65–99)
POTASSIUM: 2.8 mmol/L — AB (ref 3.5–5.1)
Sodium: 132 mmol/L — ABNORMAL LOW (ref 135–145)
Total Protein: 5.7 g/dL — ABNORMAL LOW (ref 6.5–8.1)

## 2015-03-25 LAB — TSH: TSH: 0.73 u[IU]/mL (ref 0.350–4.500)

## 2015-03-25 LAB — LACTIC ACID, PLASMA: LACTIC ACID, VENOUS: 1 mmol/L (ref 0.5–2.0)

## 2015-03-25 MED ORDER — SALINE SPRAY 0.65 % NA SOLN
2.0000 | NASAL | Status: DC | PRN
  Filled 2015-03-25: qty 44

## 2015-03-25 MED ORDER — NICOTINE 7 MG/24HR TD PT24: 7.0000 mg | MEDICATED_PATCH | Freq: Every day | TRANSDERMAL | Status: AC

## 2015-03-25 MED ORDER — LEVOFLOXACIN 250 MG PO TABS: 250.0000 mg | ORAL_TABLET | Freq: Every day | ORAL | Status: DC

## 2015-03-25 MED ORDER — ADULT MULTIVITAMIN W/MINERALS CH
1.0000 | ORAL_TABLET | ORAL | Status: DC
  Administered 2015-03-26: 1 via ORAL
  Filled 2015-03-25: qty 1

## 2015-03-25 MED ORDER — SODIUM CHLORIDE 0.9 % IJ SOLN
3.0000 mL | Freq: Two times a day (BID) | INTRAMUSCULAR | Status: DC
  Administered 2015-03-25 – 2015-03-26 (×2): 3 mL via INTRAVENOUS

## 2015-03-25 MED ORDER — ONDANSETRON HCL 4 MG PO TABS: 4.0000 mg | ORAL_TABLET | Freq: Four times a day (QID) | ORAL | Status: DC | PRN

## 2015-03-25 MED ORDER — MOMETASONE FURO-FORMOTEROL FUM 100-5 MCG/ACT IN AERO
2.0000 | INHALATION_SPRAY | Freq: Two times a day (BID) | RESPIRATORY_TRACT | Status: DC
  Administered 2015-03-25 – 2015-03-26 (×3): 2 via RESPIRATORY_TRACT
  Filled 2015-03-25 (×2): qty 8.8

## 2015-03-25 MED ORDER — HYDROCOD POLST-CPM POLST ER 10-8 MG/5ML PO SUER: 5.0000 mL | Freq: Two times a day (BID) | ORAL | Status: DC | PRN

## 2015-03-25 MED ORDER — ONDANSETRON HCL 4 MG/2ML IJ SOLN: 4.0000 mg | Freq: Four times a day (QID) | INTRAMUSCULAR | Status: DC | PRN

## 2015-03-25 MED ORDER — ACETAMINOPHEN 650 MG RE SUPP: 650.0000 mg | Freq: Four times a day (QID) | RECTAL | Status: DC | PRN

## 2015-03-25 MED ORDER — DOCUSATE SODIUM 100 MG PO CAPS
100.0000 mg | ORAL_CAPSULE | Freq: Two times a day (BID) | ORAL | Status: DC
  Administered 2015-03-25 – 2015-03-26 (×2): 100 mg via ORAL
  Filled 2015-03-25 (×3): qty 1

## 2015-03-25 MED ORDER — ADULT MULTIVITAMIN W/MINERALS CH
1.0000 | ORAL_TABLET | Freq: Every day | ORAL | Status: DC
  Administered 2015-03-25: 1 via ORAL
  Filled 2015-03-25: qty 1

## 2015-03-25 MED ORDER — IPRATROPIUM-ALBUTEROL 0.5-2.5 (3) MG/3ML IN SOLN
3.0000 mL | RESPIRATORY_TRACT | Status: DC
  Administered 2015-03-25 – 2015-03-26 (×6): 3 mL via RESPIRATORY_TRACT
  Filled 2015-03-25 (×6): qty 3

## 2015-03-25 MED ORDER — TRAZODONE HCL 50 MG PO TABS
50.0000 mg | ORAL_TABLET | Freq: Every day | ORAL | Status: DC
  Administered 2015-03-25: 50 mg via ORAL
  Filled 2015-03-25: qty 1

## 2015-03-25 MED ORDER — ALBUTEROL SULFATE HFA 108 (90 BASE) MCG/ACT IN AERS
2.0000 | INHALATION_SPRAY | RESPIRATORY_TRACT | Status: AC | PRN
Start: 2015-03-25 — End: ?

## 2015-03-25 MED ORDER — QUETIAPINE FUMARATE 25 MG PO TABS
200.0000 mg | ORAL_TABLET | Freq: Every day | ORAL | Status: DC
  Administered 2015-03-25: 200 mg via ORAL
  Filled 2015-03-25 (×2): qty 8

## 2015-03-25 MED ORDER — ACETAMINOPHEN 325 MG PO TABS: 650.0000 mg | ORAL_TABLET | Freq: Four times a day (QID) | ORAL | Status: DC | PRN

## 2015-03-25 MED ORDER — PREDNISONE 20 MG PO TABS
30.0000 mg | ORAL_TABLET | ORAL | Status: DC
  Administered 2015-03-25: 30 mg via ORAL
  Filled 2015-03-25: qty 1

## 2015-03-25 MED ORDER — METHYLPREDNISOLONE SODIUM SUCC 125 MG IJ SOLR
125.0000 mg | INTRAMUSCULAR | Status: AC
  Administered 2015-03-25: 125 mg via INTRAVENOUS
  Filled 2015-03-25: qty 2

## 2015-03-25 MED ORDER — LAMOTRIGINE 100 MG PO TABS
100.0000 mg | ORAL_TABLET | Freq: Two times a day (BID) | ORAL | Status: DC
  Administered 2015-03-25 – 2015-03-26 (×3): 100 mg via ORAL
  Filled 2015-03-25 (×3): qty 1

## 2015-03-25 MED ORDER — NICOTINE 7 MG/24HR TD PT24
7.0000 mg | MEDICATED_PATCH | Freq: Every day | TRANSDERMAL | Status: DC
  Administered 2015-03-25 – 2015-03-26 (×2): 7 mg via TRANSDERMAL
  Filled 2015-03-25 (×2): qty 1

## 2015-03-25 MED ORDER — GUAIFENESIN 100 MG/5ML PO SYRP
200.0000 mg | ORAL_SOLUTION | ORAL | Status: DC | PRN
  Filled 2015-03-25: qty 10

## 2015-03-25 MED ORDER — DONEPEZIL HCL 5 MG PO TABS
5.0000 mg | ORAL_TABLET | Freq: Every day | ORAL | Status: DC
  Administered 2015-03-25: 5 mg via ORAL
  Filled 2015-03-25: qty 1

## 2015-03-25 MED ORDER — LOSARTAN POTASSIUM 25 MG PO TABS
25.0000 mg | ORAL_TABLET | Freq: Every day | ORAL | Status: DC
Start: 2015-03-25 — End: 2015-03-25

## 2015-03-25 MED ORDER — PREDNISONE 20 MG PO TABS: 60.0000 mg | ORAL_TABLET | Freq: Every day | ORAL | Status: DC

## 2015-03-25 MED ORDER — PRAVASTATIN SODIUM 20 MG PO TABS
20.0000 mg | ORAL_TABLET | Freq: Every day | ORAL | Status: DC
Start: 2015-03-25 — End: 2015-03-25

## 2015-03-25 MED ORDER — TRAMADOL HCL 50 MG PO TABS
50.0000 mg | ORAL_TABLET | Freq: Four times a day (QID) | ORAL | Status: DC | PRN
  Administered 2015-03-25 – 2015-03-26 (×2): 50 mg via ORAL
  Filled 2015-03-25 (×2): qty 1

## 2015-03-25 MED ORDER — AMLODIPINE BESYLATE 5 MG PO TABS: 5.0000 mg | ORAL_TABLET | Freq: Every day | ORAL | Status: DC

## 2015-03-25 MED ORDER — DILTIAZEM HCL ER COATED BEADS 120 MG PO CP24
120.0000 mg | ORAL_CAPSULE | Freq: Every day | ORAL | Status: DC
  Administered 2015-03-25 – 2015-03-26 (×2): 120 mg via ORAL
  Filled 2015-03-25 (×2): qty 1

## 2015-03-25 MED ORDER — DOXYCYCLINE HYCLATE 50 MG PO CAPS: 50.0000 mg | ORAL_CAPSULE | Freq: Two times a day (BID) | ORAL | Status: DC

## 2015-03-25 MED ORDER — SODIUM CHLORIDE 0.9 % IV BOLUS (SEPSIS)
1000.0000 mL | Freq: Once | INTRAVENOUS | Status: AC
  Administered 2015-03-25: 1000 mL via INTRAVENOUS

## 2015-03-25 MED ORDER — PYRIDOSTIGMINE BROMIDE 60 MG PO TABS
60.0000 mg | ORAL_TABLET | Freq: Three times a day (TID) | ORAL | Status: DC
  Administered 2015-03-25 – 2015-03-26 (×4): 60 mg via ORAL
  Filled 2015-03-25 (×4): qty 1

## 2015-03-25 MED ORDER — ALBUTEROL SULFATE (2.5 MG/3ML) 0.083% IN NEBU: 2.5000 mg | INHALATION_SOLUTION | RESPIRATORY_TRACT | Status: DC | PRN

## 2015-03-25 MED ORDER — HYDROCOD POLST-CPM POLST ER 10-8 MG/5ML PO SUER
5.0000 mL | Freq: Two times a day (BID) | ORAL | Status: DC
  Administered 2015-03-25 – 2015-03-26 (×3): 5 mL via ORAL
  Filled 2015-03-25 (×3): qty 5

## 2015-03-25 MED ORDER — DOXYCYCLINE HYCLATE 100 MG PO TABS
100.0000 mg | ORAL_TABLET | Freq: Two times a day (BID) | ORAL | Status: DC
  Administered 2015-03-25 – 2015-03-26 (×3): 100 mg via ORAL
  Filled 2015-03-25 (×3): qty 1

## 2015-03-25 MED ORDER — LATANOPROST 0.005 % OP SOLN
1.0000 [drp] | Freq: Every day | OPHTHALMIC | Status: DC
  Administered 2015-03-25: 1 [drp] via OPHTHALMIC
  Filled 2015-03-25 (×2): qty 2.5

## 2015-03-25 MED ORDER — BUSPIRONE HCL 10 MG PO TABS
10.0000 mg | ORAL_TABLET | Freq: Two times a day (BID) | ORAL | Status: DC
  Administered 2015-03-25 – 2015-03-26 (×3): 10 mg via ORAL
  Filled 2015-03-25 (×5): qty 1

## 2015-03-25 MED ORDER — PRAVASTATIN SODIUM 20 MG PO TABS
20.0000 mg | ORAL_TABLET | Freq: Every day | ORAL | Status: DC
  Administered 2015-03-25: 20 mg via ORAL
  Filled 2015-03-25: qty 1

## 2015-03-25 MED ORDER — CARVEDILOL 3.125 MG PO TABS
3.1250 mg | ORAL_TABLET | Freq: Two times a day (BID) | ORAL | Status: DC
  Administered 2015-03-25 – 2015-03-26 (×3): 3.125 mg via ORAL
  Filled 2015-03-25 (×3): qty 1

## 2015-03-25 MED ORDER — HEPARIN SODIUM (PORCINE) 5000 UNIT/ML IJ SOLN
5000.0000 [IU] | Freq: Three times a day (TID) | INTRAMUSCULAR | Status: DC
  Administered 2015-03-25: 5000 [IU] via SUBCUTANEOUS
  Filled 2015-03-25 (×2): qty 1

## 2015-03-25 MED ORDER — POTASSIUM CHLORIDE IN NACL 40-0.9 MEQ/L-% IV SOLN
INTRAVENOUS | Status: DC
  Administered 2015-03-25: 100 mL/h via INTRAVENOUS
  Filled 2015-03-25 (×4): qty 1000

## 2015-03-25 NOTE — ED Notes (Signed)
Troponin 0.08 reported to Dr Thomasene Lot

## 2015-03-25 NOTE — ED Notes (Signed)
Patient denies pain and is resting comfortably.  

## 2015-03-25 NOTE — ED Provider Notes (Signed)
[facility] Emergency Department Provider Note  ____________________________________________  Time seen: 0 148  I have reviewed the triage vital signs and the nursing notes.   HISTORY  Chief Complaint Fall     HPI Mackenzie Rose is a 69 y.o. female who presents to the emergency Department status post fall due to feeling weak. She reports she has felt weak for approximately 3 days since she began to have acute onset of cold symptoms. She has also had nausea. These cold symptoms include sneezing and coughing. She reports that when she coughs her belly hurts. She also has some discomfort when she takes a deeper breath in her abdomen.  The patient was in the hospital approximately a month ago with a diagnosis of pneumonia. She does have myasthenia gravis, although she says she thinks that is doing better. She denies any fever, however she reports an episode of diaphoresis which is worrisome for fever.  She denies any focal weakness.   Past Medical History  Diagnosis Date  . Heart disease   . Atrial fibrillation     a. reported a-fib; b. unknown chronicity; c. dates back to 1970s; d. not on long term anticoagulation 2/2 no documented a-fib  . Hypertension   . High cholesterol   . Diabetes     borderline  . COPD (chronic obstructive pulmonary disease)   . Melanoma   . Kidney disorder   . Tremor   . Autoimmune disease   . Depressed   . Anxiety disorder   . Migraine   . Dementia   . Motion sickness   . Myasthenia gravis   . Bipolar disorder   . Dementia     Patient Active Problem List   Diagnosis Date Noted  . Anxiety 12/12/2014  . Bipolar affective disorder, mixed 12/12/2014  . Renal insufficiency syndrome 12/12/2014  . H/O: HTN (hypertension) 12/12/2014  . H/O diabetes mellitus 12/12/2014  . CAFL (chronic airflow limitation) 12/12/2014  . Bipolar disorder   . Dementia   . Ulnar neuropathy at elbow of right upper extremity 09/22/2014  .  Myasthenia gravis 11/22/2013    Past Surgical History  Procedure Laterality Date  . Tubal ligation    . Retena repair    . Cataract extraction      Current Outpatient Rx  Name  Route  Sig  Dispense  Refill  . acetaminophen (TYLENOL) 650 MG CR tablet   Oral   Take 650 mg by mouth every 4 (four) hours as needed for pain.         Marland Kitchen amLODipine (NORVASC) 5 MG tablet   Oral   Take 5 mg by mouth daily.         . busPIRone (BUSPAR) 5 MG tablet   Oral   Take 10 mg by mouth 2 (two) times daily.          . Calcium Carbonate-Vitamin D (CALCIUM 500 + D PO)   Oral   Take by mouth.         . carvedilol (COREG) 3.125 MG tablet   Oral   Take 3.125 mg by mouth 2 (two) times daily with a meal.         . diclofenac sodium (VOLTAREN) 1 % GEL   Topical   Apply topically 4 (four) times daily.         Marland Kitchen diltiazem (CARDIZEM CD) 180 MG 24 hr capsule   Oral   Take 120 mg by mouth daily.          Marland Kitchen  donepezil (ARICEPT) 10 MG tablet   Oral   Take 5 mg by mouth at bedtime.          . Fluticasone-Salmeterol (ADVAIR) 250-50 MCG/DOSE AEPB   Inhalation   Inhale 1 puff into the lungs 2 (two) times daily.         Marland Kitchen glucagon 1 MG injection   Intravenous   Inject 1 mg into the vein once as needed.         Marland Kitchen ibuprofen (ADVIL,MOTRIN) 400 MG tablet   Oral   Take 400 mg by mouth every 6 (six) hours as needed.         . indapamide (LOZOL) 1.25 MG tablet               . lamoTRIgine (LAMICTAL) 100 MG tablet   Oral   Take 100 mg by mouth 2 (two) times daily.         Marland Kitchen latanoprost (XALATAN) 0.005 % ophthalmic solution      1 drop at bedtime.         Marland Kitchen losartan (COZAAR) 25 MG tablet   Oral   Take 25 mg by mouth daily.         . Multiple Vitamin (MULTIVITAMIN) tablet   Oral   Take 1 tablet by mouth daily.         . pravastatin (PRAVACHOL) 20 MG tablet   Oral   Take 20 mg by mouth daily.         . predniSONE (DELTASONE) 10 MG tablet   Oral   Take 3  tablets (30 mg total) by mouth every other day.   90 tablet   3   . pyridostigmine (MESTINON) 60 MG tablet   Oral   Take 60 mg by mouth 3 (three) times daily. Take 1 1/2 tablets by mouth q 8 hours.         Marland Kitchen QUEtiapine (SEROQUEL) 200 MG tablet   Oral   Take 200 mg by mouth at bedtime.         . sodium chloride (OCEAN) 0.65 % SOLN nasal spray   Each Nare   Place 2 sprays into both nostrils.         . traMADol (ULTRAM) 50 MG tablet   Oral   Take 50 mg by mouth every 6 (six) hours as needed. Take 1/2 tablet q 6 hours prn         . traZODone (DESYREL) 50 MG tablet   Oral   Take 50 mg by mouth at bedtime. Take 1/2 at bedtime.           Allergies Lactose; Lithium; and Penicillins  Family History  Problem Relation Age of Onset  . Heart disease Father   . Other Mother     ruptured hernia  . Breast cancer Sister   . Heart disease Sister     Social History History  Substance Use Topics  . Smoking status: Current Every Day Smoker -- 1.00 packs/day for 50 years    Types: Cigarettes  . Smokeless tobacco: Not on file  . Alcohol Use: Yes     Comment: Occasionally    Review of Systems  Constitutional: Negative for fever, but with a history of diaphoresis worrisome for possible fever. Positive for general weakness. ENT: Negative for sore throat. Cardiovascular: Negative for chest pain. Respiratory: Positive for URI symptoms and a cough.. Gastrointestinal: Patient with pain in her belly when she coughs. No vomiting and diarrhea. Genitourinary: Negative for  dysuria. Musculoskeletal: No myalgias or injuries. Skin: Negative for rash. Neurological: Negative for headaches   10-point ROS otherwise negative.  ____________________________________________   PHYSICAL EXAM:  VITAL SIGNS: ED Triage Vitals  Enc Vitals Group     BP 03/25/15 0114 74/50 mmHg     Pulse Rate 03/25/15 0114 82     Resp 03/25/15 0114 18     Temp 03/25/15 0114 97.8 F (36.6 C)     Temp  src --      SpO2 03/25/15 0114 92 %     Weight --      Height --      Head Cir --      Peak Flow --      Pain Score --      Pain Loc --      Pain Edu? --      Excl. in [place]? --     Constitutional: Alert and oriented. Patient reports weakness, but is interactive and engaging. ENT   Head: Normocephalic and atraumatic.   Nose: No congestion/rhinnorhea.   Mouth/Throat: Mucous membranes are moist. Cardiovascular: Normal rate, regular rhythm, no murmur noted Respiratory:  Normal respiratory effort, no tachypnea.    Breath sounds are clear and equal bilaterally.  Gastrointestinal: Soft and nontender. No distention.  Back: No muscle spasm, no tenderness, no CVA tenderness. Musculoskeletal: No deformity noted. Nontender with normal range of motion in all extremities.  No noted edema. Neurologic:  Normal speech and language. Equal grip strength bilaterally. 5 over 5 strength in all 4 extremities. No gross focal neurologic deficits are appreciated.  Skin:  Skin is warm, dry. No rash noted. Psychiatric: Mood and affect are normal. Speech and behavior are normal.  ____________________________________________    LABS (pertinent positives/negatives)  Labs Reviewed  CBC WITH DIFFERENTIAL/PLATELET - Abnormal; Notable for the following:    WBC 12.1 (*)    RBC 3.77 (*)    Hemoglobin 8.8 (*)    HCT 28.5 (*)    MCV 75.6 (*)    MCH 23.2 (*)    MCHC 30.8 (*)    RDW 18.4 (*)    Neutro Abs 10.2 (*)    All other components within normal limits  COMPREHENSIVE METABOLIC PANEL - Abnormal; Notable for the following:    Sodium 132 (*)    Potassium 2.8 (*)    Chloride 94 (*)    Glucose, Bld 185 (*)    BUN 30 (*)    Creatinine, Ser 2.06 (*)    Calcium 8.4 (*)    Total Protein 5.7 (*)    Albumin 3.3 (*)    GFR calc non Af Amer 23 (*)    GFR calc Af Amer 27 (*)    All other components within normal limits  TROPONIN I - Abnormal; Notable for the following:    Troponin I 0.08 (*)    All  other components within normal limits  URINALYSIS COMPLETEWITH MICROSCOPIC ([facility] ONLY) - Abnormal; Notable for the following:    Color, Urine YELLOW (*)    APPearance CLEAR (*)    Protein, ur 30 (*)    Bacteria, UA RARE (*)    Squamous Epithelial / LPF 0-5 (*)    All other components within normal limits  CULTURE, BLOOD (ROUTINE X 2)  CULTURE, BLOOD (ROUTINE X 2)  LACTIC ACID, PLASMA  LACTIC ACID, PLASMA     ____________________________________________   EKG  ED ECG REPORT I, Ruchi Stoney W, the attending physician, personally viewed and interpreted this ECG.  Date: 03/25/2015  EKG Time: 0 135  Rate: 77  Rhythm: Normal sinus rhythm - bifascicular block  Axis: -66  Intervals: QTC prolonged at 549, QRS prolonged at 148  ST&T Change:    ____________________________________________    RADIOLOGY  Chest x-ray: Mild cardiomegaly. Right lung base atelectasis/scarring  ____________________________________________  CRITICAL CARE Performed by: Ahmed Prima   Total critical care time: 40 minutes due to the patient's hypotension, sepsis-like symptoms  Critical care time was exclusive of separately billable procedures and treating other patients.  Critical care was necessary to treat or prevent imminent or life-threatening deterioration.  Critical care was time spent personally by me on the following activities: development of treatment plan with patient and/or surrogate as well as nursing, discussions with consultants, evaluation of patient's response to treatment, examination of patient, obtaining history from patient or surrogate, ordering and performing treatments and interventions, ordering and review of laboratory studies, ordering and review of radiographic studies, pulse oximetry and re-evaluation of patient's condition.  ____________________________________________   INITIAL IMPRESSION / ASSESSMENT AND PLAN / ED COURSE  Pertinent labs & imaging results  that were available during my care of the patient were reviewed by me and considered in my medical decision making (see chart for details).  Patient with worrisome hypotension on arrival. She feels weak. With the hypotension and subjective fever/diaphoresis, the patient could be septic. We will treat her with a fluid bolus, cultures, chest x-ray, and standard tests.  ----------------------------------------- 3:43 AM on 03/25/2015 -----------------------------------------  Mild elevation in her white blood cell count of 12.1 BUN 30, creatinine 2.0, potassium is low at 2.8. Troponin is elevated at 0.08  ----------------------------------------- 5:07 AM on 03/25/2015 -----------------------------------------  Urinalysis is returned, normal, no sign of infection. Lactic acid is negative. We have provided her with a potassium pill. I have spoken with Dr. Marcille Blanco for admission at the hospital.  ____________________________________________   FINAL CLINICAL IMPRESSION(S) / ED DIAGNOSES  Final diagnoses:  Hypotension, unspecified hypotension type  Hypokalemia  Elevated troponin      Ahmed Prima, MD 03/25/15 [phone]

## 2015-03-25 NOTE — Care Management Note (Signed)
Case Management Note  Patient Details  Name: Mackenzie Rose MRN: [id number] Date of Birth: [birth year]  Subjective/Objective:  Medicare Observation letter reviewed with patient, verbalized understanding, signed, copy given, original placed on chart.                  Action/Plan:   Expected Discharge Date:                  Expected Discharge Plan:     In-House Referral:     Discharge planning Services     Post Acute Care Choice:    Choice offered to:     DME Arranged:    DME Agency:     HH Arranged:    [place] Agency:     Status of Service:     Medicare Important Message Given:    Date Medicare IM Given:    Medicare IM give by:    Date Additional Medicare IM Given:    Additional Medicare Important Message give by:     If discussed at [place] of Stay Meetings, dates discussed:    Additional Comments:  Ival Bible, RN 03/25/2015, 1:29 PM

## 2015-03-25 NOTE — ED Notes (Signed)
Pt reports getting up to go to restroom and hit the door casing and fell. Pt also reports several days of nausea and fatigue.

## 2015-03-25 NOTE — Consult Note (Signed)
[facility] Cardiology Consultation Note  Patient ID: Mackenzie Rose, MRN: [id number], DOB/AGE: 05-24-1946 69 y.o. Admit date: 03/25/2015 none   Date of Consult: 03/25/2015 Primary Physician: Lavera Guise, MD Primary Cardiologist: None  Chief Complaint:  Chief Complaint  Patient presents with  . Fall   Reason for Consult: syncope with elevated troponin  HPI: 69 y.o. female with known borderline diabetes without apparent complication with mixed hyperlipidemia and borderline hypertension with the episode of weakness and fatigue and syncope. This syncope was more of running into the wall and then falling rather than true syncope. The patient has had no apparent injury from this issue. There's been no evidence of rhythm disturbances during her early hospitalization but we she was seen in the emergency room she had an EKG showing normal sinus rhythm with bifascicular block. The patient has had no evidence of previous myocardial infarction and/or cardiomyopathy. She does report that she has a history of atrial fibrillation but currently is in normal sinus rhythm and does take carvedilol and Cardizem which showed may be helping with her atrial fibrillation maintenance of normal sinus rhythm. She does have an elevated troponin of 0.07 more consistent with demand ischemia rather than acute coronary syndrome  Past Medical History  Diagnosis Date  . Heart disease   . Atrial fibrillation     a. reported a-fib; b. unknown chronicity; c. dates back to 1970s; d. not on long term anticoagulation 2/2 no documented a-fib  . Hypertension   . High cholesterol   . Diabetes     borderline  . COPD (chronic obstructive pulmonary disease)   . Melanoma   . Kidney disorder   . Tremor   . Autoimmune disease   . Depressed   . Anxiety disorder   . Migraine   . Dementia   . Motion sickness   . Myasthenia gravis   . Bipolar disorder   . Dementia       Surgical History:  Past Surgical History  Procedure  Laterality Date  . Tubal ligation    . Retena repair    . Cataract extraction    . Cholecystectomy       Home Meds: Prior to Admission medications   Medication Sig Start Date End Date Taking? Authorizing Provider  acetaminophen (TYLENOL) 650 MG CR tablet Take 650 mg by mouth every 4 (four) hours as needed for pain.    Historical Provider, MD  albuterol (PROVENTIL HFA;VENTOLIN HFA) 108 (90 BASE) MCG/ACT inhaler Inhale 2 puffs into the lungs every 4 (four) hours as needed for wheezing or shortness of breath. 03/25/15   Srikar Sudini, MD  amLODipine (NORVASC) 5 MG tablet Take 5 mg by mouth daily.    Historical Provider, MD  busPIRone (BUSPAR) 5 MG tablet Take 10 mg by mouth 2 (two) times daily.     Historical Provider, MD  Calcium Carbonate-Vitamin D (CALCIUM 500 + D PO) Take by mouth.    Historical Provider, MD  carvedilol (COREG) 3.125 MG tablet Take 3.125 mg by mouth 2 (two) times daily with a meal.    Historical Provider, MD  chlorpheniramine-HYDROcodone (TUSSIONEX) 10-8 MG/5ML SUER Take 5 mLs by mouth every 12 (twelve) hours as needed for cough. 03/25/15   Srikar Sudini, MD  diclofenac sodium (VOLTAREN) 1 % GEL Apply topically 4 (four) times daily.    Historical Provider, MD  diltiazem (CARDIZEM CD) 180 MG 24 hr capsule Take 120 mg by mouth daily.     Historical Provider, MD  donepezil (  ARICEPT) 10 MG tablet Take 5 mg by mouth at bedtime.     Historical Provider, MD  doxycycline (VIBRAMYCIN) 50 MG capsule Take 1 capsule (50 mg total) by mouth 2 (two) times daily. 03/25/15   Srikar Sudini, MD  Fluticasone-Salmeterol (ADVAIR) 250-50 MCG/DOSE AEPB Inhale 1 puff into the lungs 2 (two) times daily.    Historical Provider, MD  glucagon 1 MG injection Inject 1 mg into the vein once as needed.    Historical Provider, MD  ibuprofen (ADVIL,MOTRIN) 400 MG tablet Take 400 mg by mouth every 6 (six) hours as needed.    Historical Provider, MD  indapamide (LOZOL) 1.25 MG tablet  02/21/15   Historical  Provider, MD  lamoTRIgine (LAMICTAL) 100 MG tablet Take 100 mg by mouth 2 (two) times daily.    Historical Provider, MD  latanoprost (XALATAN) 0.005 % ophthalmic solution 1 drop at bedtime.    Historical Provider, MD  losartan (COZAAR) 25 MG tablet Take 25 mg by mouth daily.    Historical Provider, MD  Multiple Vitamin (MULTIVITAMIN) tablet Take 1 tablet by mouth daily.    Historical Provider, MD  nicotine (NICODERM CQ - DOSED IN MG/24 HR) 7 mg/24hr patch Place 1 patch (7 mg total) onto the skin daily. 03/25/15   Srikar Sudini, MD  pravastatin (PRAVACHOL) 20 MG tablet Take 20 mg by mouth daily.    Historical Provider, MD  predniSONE (DELTASONE) 10 MG tablet Take 3 tablets (30 mg total) by mouth every other day. 03/16/15   Donika Keith Rake, DO  predniSONE (DELTASONE) 20 MG tablet Take 3 tablets (60 mg total) by mouth daily with breakfast. 03/25/15   Hillary Bow, MD  pyridostigmine (MESTINON) 60 MG tablet Take 60 mg by mouth 3 (three) times daily. Take 1 1/2 tablets by mouth q 8 hours.    Historical Provider, MD  QUEtiapine (SEROQUEL) 200 MG tablet Take 200 mg by mouth at bedtime.    Historical Provider, MD  sodium chloride (OCEAN) 0.65 % SOLN nasal spray Place 2 sprays into both nostrils.    Historical Provider, MD  traMADol (ULTRAM) 50 MG tablet Take 50 mg by mouth every 6 (six) hours as needed. Take 1/2 tablet q 6 hours prn    Historical Provider, MD  traZODone (DESYREL) 50 MG tablet Take 50 mg by mouth at bedtime. Take 1/2 at bedtime.    Historical Provider, MD    Inpatient Medications:  . busPIRone  10 mg Oral BID  . carvedilol  3.125 mg Oral BID WC  . chlorpheniramine-HYDROcodone  5 mL Oral Q12H  . diltiazem  120 mg Oral Daily  . docusate sodium  100 mg Oral BID  . donepezil  5 mg Oral QHS  . doxycycline  100 mg Oral Q12H  . heparin  5,000 Units Subcutaneous 3 times per day  . ipratropium-albuterol  3 mL Nebulization Q4H  . lamoTRIgine  100 mg Oral BID  . latanoprost  1 drop Both Eyes QHS   . mometasone-formoterol  2 puff Inhalation BID  . [START ON 03/26/2015] multivitamin with minerals  1 tablet Oral Q24H  . nicotine  7 mg Transdermal Daily  . pravastatin  20 mg Oral Daily  . predniSONE  30 mg Oral QODAY  . pyridostigmine  60 mg Oral TID  . QUEtiapine  200 mg Oral QHS  . sodium chloride  3 mL Intravenous Q12H  . traZODone  50 mg Oral QHS   . 0.9 % NaCl with KCl 40 mEq / L  100 mL/hr (03/25/15 0914)    Allergies:  Allergies  Allergen Reactions  . Lactose   . Lithium   . Penicillins Rash    History   Social History  . Marital Status: Single    Spouse Name: N/A  . Number of Children: 1  . Years of Education: N/A   Occupational History  . retired    Social History Main Topics  . Smoking status: Current Every Day Smoker -- 1.00 packs/day for 50 years    Types: Cigarettes  . Smokeless tobacco: Not on file  . Alcohol Use: Yes     Comment: Occasionally  . Drug Use: No  . Sexual Activity: Not on file   Other Topics Concern  . Not on file   Social History Narrative   She lives in assisted retirement home.   She has one living son and three miscarriages.   She did clerical work for [employer]   Highest level of education:  12th grade     Family History  Problem Relation Age of Onset  . Heart disease Father   . Other Mother     ruptured hernia  . Breast cancer Sister   . Heart disease Sister   . Diabetes Mellitus II Sister     x2     Review of Systems Positive for weakness and fatigue Negative for: General:  chills, fever, night sweats or weight changes.  Cardiovascular: PND orthopnea   Dermatological skin lesions rashes Respiratory: Cough congestion Urologic: Frequent urination urination at night and hematuria Abdominal: negative for nausea, vomiting, diarrhea, bright red blood per rectum, melena, or hematemesis Neurologic: negative for visual changes, and/or hearing changes  All other systems reviewed and are otherwise negative except  as noted above.  Labs:  Recent Labs  03/25/15 0127 03/25/15 0741 03/25/15 1306  TROPONINI 0.08* 0.07* 0.06*   Lab Results  Component Value Date   WBC 12.1* 03/25/2015   HGB 8.8* 03/25/2015   HCT 28.5* 03/25/2015   MCV 75.6* 03/25/2015   PLT 185 03/25/2015    Recent Labs Lab 03/25/15 0127 03/25/15 1455  NA 132* 131*  K 2.8* 4.1  CL 94* 97*  CO2 27 27  BUN 30* 29*  CREATININE 2.06* 1.54*  CALCIUM 8.4* 7.8*  PROT 5.7*  --   BILITOT 0.3  --   ALKPHOS 68  --   ALT 14  --   AST 32  --   GLUCOSE 185* 241*   No results found for: CHOL, HDL, LDLCALC, TRIG No results found for: DDIMER  Radiology/Studies:  Dg Chest Port 1 View  03/25/2015   CLINICAL DATA:  Weakness and nausea, walked into door frame. Recent hospital admission for pneumonia. History of heart disease, atrial fibrillation, COPD, diabetes, hypertension.  EXAM: PORTABLE CHEST - 1 VIEW  COMPARISON:  Chest radiograph January 01, 2015  FINDINGS: Cardiac silhouette is mildly enlarged. Tortuous, possibly ectatic aorta. Moderate hiatal hernia. Mildly elevated RIGHT hemidiaphragm with hepatic eventration. RIGHT lung base strandy densities. No pleural effusion. No pneumothorax. Soft tissue planes and included osseous structure nonsuspicious.  IMPRESSION: Mild cardiomegaly.  RIGHT lung base atelectasis/scarring.   Electronically Signed   By: Elon Alas M.D.   On: 03/25/2015 02:01    EKG: Normal sinus rhythm with bifascicular block  Weights: Filed Weights   03/25/15 0650  Weight: 131 lb 6.4 oz (59.603 kg)     Physical Exam: Blood pressure 136/65, pulse 87, temperature 99.2 F (37.3 C), temperature source Oral, resp. rate  17, height 5\' 3"  (1.6 m), weight 131 lb 6.4 oz (59.603 kg), SpO2 93 %. Body mass index is 23.28 kg/(m^2). General: Well developed, well nourished, in no acute distress. Head eyes ears nose throat: Normocephalic, atraumatic, sclera non-icteric, no xanthomas, nares are without discharge. No  apparent thyromegaly and/or mass  Lungs: Normal respiratory effort.  no wheezes, no rales, no rhonchi.  Heart: RRR with normal S1 S2 2+ mitral murmur gallop, no rub, PMI is normal size and placement, carotid upstroke normal without bruit, jugular venous pressure is normal Abdomen: Soft, non-tender, non-distended with normoactive bowel sounds. No hepatomegaly. No rebound/guarding. No obvious abdominal masses. Abdominal aorta is normal size without bruit Extremities: No edema. no cyanosis, no clubbing, no ulcers  Peripheral : 2+ bilateral upper extremity pulses, 2+ bilateral femoral pulses, 2+ bilateral dorsal pedal pulse Neuro: Alert and oriented. No facial asymmetry. No focal deficit. Moves all extremities spontaneously. Musculoskeletal: Normal muscle tone without kyphosis Psych:  Responds to questions appropriately with a normal affect.    Assessment: 69 year old female with history of atrial fibrillation currently in normal sinus rhythm with mixed hyperlipidemia central hypertension and diabetes having a syncopal episode with a minimal elevation of troponin most consistent with demand ischemia acute coronary syndrome  Plan: 1. Serial ECG and enzymes to assess for myocardial infarction 2. Echocardiogram for LV systolic dysfunction and cause of syncope 3. Continue diltiazem as well as a beta blocker for heart rate control and maintenance of normal sinus rhythm 4. Consider in or outpatient treadmill Myoview to assess exercise tolerance myocardial ischemia and/or other rhythm disturbances 5. Further diagnostic testing and treatment options after above  Signed, Corey Skains M.D. [facility] 03/25/2015, 3:22 PM

## 2015-03-25 NOTE — H&P (Signed)
Mackenzie Rose is an 69 y.o. female.   Chief Complaint: Dizziness HPI: The patient presents emergency department via EMS after "running into a wall" while attempting to hurry to the bathroom. The patient admits that she's been feeling dizzy and that she has become short of breath with cough. She is short of breath at baseline but has become more so lately. Her cough has worsened and is causing abdominal pain as well. His evening the patient became diaphoretic with her shortness of breath but denies any chest pain. She was hospitalized one month ago for pneumonia. Laboratory evaluation in the emergency department revealed acute on chronic kidney injury in addition to a mildly elevated troponin. She was also found to be hypotensive which prompted the emergency department to call for admission.  Past Medical History  Diagnosis Date  . Heart disease   . Atrial fibrillation     a. reported a-fib; b. unknown chronicity; c. dates back to 1970s; d. not on long term anticoagulation 2/2 no documented a-fib  . Hypertension   . High cholesterol   . Diabetes     borderline  . COPD (chronic obstructive pulmonary disease)   . Melanoma   . Kidney disorder   . Tremor   . Autoimmune disease   . Depressed   . Anxiety disorder   . Migraine   . Dementia   . Motion sickness   . Myasthenia gravis   . Bipolar disorder   . Dementia     Past Surgical History  Procedure Laterality Date  . Tubal ligation    . Retena repair    . Cataract extraction    . Cholecystectomy      Family History  Problem Relation Age of Onset  . Heart disease Father   . Other Mother     ruptured hernia  . Breast cancer Sister   . Heart disease Sister   . Diabetes Mellitus II Sister     x2   Social History:  reports that she has been smoking Cigarettes.  She has a 50 pack-year smoking history. She does not have any smokeless tobacco history on file. She reports that she drinks alcohol. She reports that she does not use  illicit drugs.  Allergies:  Allergies  Allergen Reactions  . Lactose   . Lithium   . Penicillins Rash    Medications Prior to Admission  Medication Sig Dispense Refill  . acetaminophen (TYLENOL) 650 MG CR tablet Take 650 mg by mouth every 4 (four) hours as needed for pain.    Marland Kitchen amLODipine (NORVASC) 5 MG tablet Take 5 mg by mouth daily.    . busPIRone (BUSPAR) 5 MG tablet Take 10 mg by mouth 2 (two) times daily.     . Calcium Carbonate-Vitamin D (CALCIUM 500 + D PO) Take by mouth.    . carvedilol (COREG) 3.125 MG tablet Take 3.125 mg by mouth 2 (two) times daily with a meal.    . diclofenac sodium (VOLTAREN) 1 % GEL Apply topically 4 (four) times daily.    Marland Kitchen diltiazem (CARDIZEM CD) 180 MG 24 hr capsule Take 120 mg by mouth daily.     Marland Kitchen donepezil (ARICEPT) 10 MG tablet Take 5 mg by mouth at bedtime.     . Fluticasone-Salmeterol (ADVAIR) 250-50 MCG/DOSE AEPB Inhale 1 puff into the lungs 2 (two) times daily.    Marland Kitchen glucagon 1 MG injection Inject 1 mg into the vein once as needed.    Marland Kitchen ibuprofen (ADVIL,MOTRIN) 400 MG  tablet Take 400 mg by mouth every 6 (six) hours as needed.    . indapamide (LOZOL) 1.25 MG tablet     . lamoTRIgine (LAMICTAL) 100 MG tablet Take 100 mg by mouth 2 (two) times daily.    Marland Kitchen latanoprost (XALATAN) 0.005 % ophthalmic solution 1 drop at bedtime.    Marland Kitchen losartan (COZAAR) 25 MG tablet Take 25 mg by mouth daily.    . Multiple Vitamin (MULTIVITAMIN) tablet Take 1 tablet by mouth daily.    . pravastatin (PRAVACHOL) 20 MG tablet Take 20 mg by mouth daily.    . predniSONE (DELTASONE) 10 MG tablet Take 3 tablets (30 mg total) by mouth every other day. 90 tablet 3  . pyridostigmine (MESTINON) 60 MG tablet Take 60 mg by mouth 3 (three) times daily. Take 1 1/2 tablets by mouth q 8 hours.    Marland Kitchen QUEtiapine (SEROQUEL) 200 MG tablet Take 200 mg by mouth at bedtime.    . sodium chloride (OCEAN) 0.65 % SOLN nasal spray Place 2 sprays into both nostrils.    . traMADol (ULTRAM) 50 MG  tablet Take 50 mg by mouth every 6 (six) hours as needed. Take 1/2 tablet q 6 hours prn    . traZODone (DESYREL) 50 MG tablet Take 50 mg by mouth at bedtime. Take 1/2 at bedtime.      Results for orders placed or performed during the hospital encounter of 03/25/15 (from the past 48 hour(s))  CBC with Differential     Status: Abnormal   Collection Time: 03/25/15  1:27 AM  Result Value Ref Range   WBC 12.1 (H) 3.6 - 11.0 K/uL   RBC 3.77 (L) 3.80 - 5.20 MIL/uL   Hemoglobin 8.8 (L) 12.0 - 16.0 g/dL   HCT 28.5 (L) 35.0 - 47.0 %   MCV 75.6 (L) 80.0 - 100.0 fL   MCH 23.2 (L) 26.0 - 34.0 pg   MCHC 30.8 (L) 32.0 - 36.0 g/dL   RDW 18.4 (H) 11.5 - 14.5 %   Platelets 185 150 - 440 K/uL   Neutrophils Relative % 84 %   Neutro Abs 10.2 (H) 1.4 - 6.5 K/uL   Lymphocytes Relative 10 %   Lymphs Abs 1.1 1.0 - 3.6 K/uL   Monocytes Relative 5 %   Monocytes Absolute 0.6 0.2 - 0.9 K/uL   Eosinophils Relative 0 %   Eosinophils Absolute 0.0 0 - 0.7 K/uL   Basophils Relative 1 %   Basophils Absolute 0.1 0 - 0.1 K/uL  Comprehensive metabolic panel     Status: Abnormal   Collection Time: 03/25/15  1:27 AM  Result Value Ref Range   Sodium 132 (L) 135 - 145 mmol/L   Potassium 2.8 (LL) 3.5 - 5.1 mmol/L    Comment: RESULTS VERIFIED BY REPEAT TESTING CRITICAL RESULT CALLED TO, READ BACK BY AND VERIFIED WITH [facility] AT 8938 03/25/15.PMH    Chloride 94 (L) 101 - 111 mmol/L   CO2 27 22 - 32 mmol/L   Glucose, Bld 185 (H) 65 - 99 mg/dL   BUN 30 (H) 6 - 20 mg/dL   Creatinine, Ser 2.06 (H) 0.44 - 1.00 mg/dL   Calcium 8.4 (L) 8.9 - 10.3 mg/dL   Total Protein 5.7 (L) 6.5 - 8.1 g/dL   Albumin 3.3 (L) 3.5 - 5.0 g/dL   AST 32 15 - 41 U/L   ALT 14 14 - 54 U/L   Alkaline Phosphatase 68 38 - 126 U/L   Total Bilirubin 0.3  0.3 - 1.2 mg/dL   GFR calc non Af Amer 23 (L) >60 mL/min   GFR calc Af Amer 27 (L) >60 mL/min    Comment: (NOTE) The eGFR has been calculated using the CKD EPI equation. This calculation has  not been validated in all clinical situations. eGFR's persistently <60 mL/min signify possible Chronic Kidney Disease.    Anion gap 11 5 - 15  Troponin I     Status: Abnormal   Collection Time: 03/25/15  1:27 AM  Result Value Ref Range   Troponin I 0.08 (H) <0.031 ng/mL    Comment: READ BACK AND VERIFIED WITH LAURIE ALLEN AT 1443 03/25/15.PMH        PERSISTENTLY INCREASED TROPONIN VALUES IN THE RANGE OF 0.04-0.49 ng/mL CAN BE SEEN IN:       -UNSTABLE ANGINA       -CONGESTIVE HEART FAILURE       -MYOCARDITIS       -CHEST TRAUMA       -ARRYHTHMIAS       -LATE PRESENTING MYOCARDIAL INFARCTION       -COPD   CLINICAL FOLLOW-UP RECOMMENDED.   Urinalysis complete, with microscopic     Status: Abnormal   Collection Time: 03/25/15  4:12 AM  Result Value Ref Range   Color, Urine YELLOW (A) YELLOW   APPearance CLEAR (A) CLEAR   Glucose, UA NEGATIVE NEGATIVE mg/dL   Bilirubin Urine NEGATIVE NEGATIVE   Ketones, ur NEGATIVE NEGATIVE mg/dL   Specific Gravity, Urine 1.017 1.005 - 1.030   Hgb urine dipstick NEGATIVE NEGATIVE   pH 5.0 5.0 - 8.0   Protein, ur 30 (A) NEGATIVE mg/dL   Nitrite NEGATIVE NEGATIVE   Leukocytes, UA NEGATIVE NEGATIVE   RBC / HPF 0-5 0 - 5 RBC/hpf   WBC, UA 0-5 0 - 5 WBC/hpf   Bacteria, UA RARE (A) NONE SEEN   Squamous Epithelial / LPF 0-5 (A) NONE SEEN   Mucous PRESENT    Hyaline Casts, UA PRESENT    Granular Casts, UA PRESENT   Lactic acid, plasma     Status: None   Collection Time: 03/25/15  4:17 AM  Result Value Ref Range   Lactic Acid, Venous 1.0 0.5 - 2.0 mmol/L   Dg Chest Port 1 View  03/25/2015   CLINICAL DATA:  Weakness and nausea, walked into door frame. Recent hospital admission for pneumonia. History of heart disease, atrial fibrillation, COPD, diabetes, hypertension.  EXAM: PORTABLE CHEST - 1 VIEW  COMPARISON:  Chest radiograph January 01, 2015  FINDINGS: Cardiac silhouette is mildly enlarged. Tortuous, possibly ectatic aorta. Moderate hiatal hernia.  Mildly elevated RIGHT hemidiaphragm with hepatic eventration. RIGHT lung base strandy densities. No pleural effusion. No pneumothorax. Soft tissue planes and included osseous structure nonsuspicious.  IMPRESSION: Mild cardiomegaly.  RIGHT lung base atelectasis/scarring.   Electronically Signed   By: Elon Alas M.D.   On: 03/25/2015 02:01    Review of Systems  Constitutional: Positive for diaphoresis. Negative for fever and chills.  HENT: Negative for sore throat and tinnitus.   Eyes: Negative for blurred vision and redness.  Respiratory: Positive for cough and shortness of breath.   Cardiovascular: Negative for chest pain, palpitations, orthopnea and PND.  Gastrointestinal: Positive for abdominal pain (Associated with cough). Negative for nausea, vomiting and diarrhea.  Genitourinary: Negative for dysuria, urgency and frequency.  Musculoskeletal: Negative for myalgias and joint pain.  Skin: Negative for rash.       No lesions  Neurological: Positive for  dizziness. Negative for speech change, focal weakness and weakness.  Endo/Heme/Allergies: Does not bruise/bleed easily.       No temperature intolerance  Psychiatric/Behavioral: Negative for depression and suicidal ideas.    Blood pressure 152/77, pulse 83, temperature 98.6 F (37 C), temperature source Oral, resp. rate 18, weight 59.603 kg (131 lb 6.4 oz), SpO2 97 %. Physical Exam  Nursing note and vitals reviewed. Constitutional: She is oriented to person, place, and time. She appears well-developed and well-nourished. No distress.  HENT:  Head: Normocephalic and atraumatic.  Mouth/Throat: Oropharynx is clear and moist.  Eyes: Conjunctivae and EOM are normal. Pupils are equal, round, and reactive to light. No scleral icterus.  Neck: Normal range of motion. Neck supple. No JVD present. No tracheal deviation present. No thyromegaly present.  Cardiovascular: Normal rate, regular rhythm and normal heart sounds.  Exam reveals no  gallop and no friction rub.   No murmur heard. Respiratory: Effort normal and breath sounds normal.  GI: Soft. Bowel sounds are normal. She exhibits no distension. There is no tenderness.  Genitourinary:  Deferred  Musculoskeletal: Normal range of motion. She exhibits no edema.  Lymphadenopathy:    She has no cervical adenopathy.  Neurological: She is alert and oriented to person, place, and time. No cranial nerve deficit. She exhibits normal muscle tone.  Skin: Skin is warm and dry. No rash noted. No erythema.  Psychiatric: She has a normal mood and affect. Her behavior is normal. Judgment and thought content normal.     Assessment/Plan This is a 69 year old Caucasian female admitted for elevated troponin, hypotension and acute on chronic kidney injury. 1. Elevated troponin: The patient denies chest pain. She has a history of chronic kidney disease which may explain an increase in troponin levels due to decreased renal clearance particularly in the setting of dehydration. EKG does not show signs of ischemia. Trend cardiac enzymes. Consult cardiology for near syncope 2. Hypotension: Likely secondary to dehydration and is probably the etiology of near syncope. We will continue to fluid resuscitate the patient and monitor telemetry. 3. Acute on chronic kidney injury: The patient has had loose stools for some time now but denies diarrhea. We'll hydrate the patient and avoid nephrotoxic drugs. I have discontinued NSAIDs. 4. Near syncope: Likely orthostasis. When the patient is fluid resuscitated we will check orthostatic blood pressures. Hold amlodipine and losartan. Continue long-acting diltiazem as this medicine does not have an acute affect, and Coreg for rate control of A. Fib. 5. Myasthenia gravis: Continue pyridostigmine and prednisone 6. Cough: No evidence of pneumonia or COPD exacerbation. The patient likely has bronchitis following recent pneumonia treatment. Cough suppressant as  needed. 7. DVT prophylaxis: Heparin 8. GI prophylaxis: None The patient is a full code. Time spent on admission orders and patient care approximately 35 minutes  Harrie Foreman 03/25/2015, 7:09 AM

## 2015-03-25 NOTE — Discharge Instructions (Addendum)
°  DIET:  Cardiac diet  DISCHARGE CONDITION:  Stable  ACTIVITY:  Activity as tolerated  OXYGEN:  Home Oxygen: No.   Oxygen Delivery: room air  DISCHARGE LOCATION:  home   If you experience worsening of your admission symptoms, develop shortness of breath, life threatening emergency, suicidal or homicidal thoughts you must seek medical attention immediately by calling 911 or calling your MD immediately  if symptoms less severe.  You Must read complete instructions/literature along with all the possible adverse reactions/side effects for all the Medicines you take and that have been prescribed to you. Take any new Medicines after you have completely understood and accpet all the possible adverse reactions/side effects.   Please note  You were cared for by a hospitalist during your hospital stay. If you have any questions about your discharge medications or the care you received while you were in the hospital after you are discharged, you can call the unit and asked to speak with the hospitalist on call if the hospitalist that took care of you is not available. Once you are discharged, your primary care physician will handle any further medical issues. Please note that NO REFILLS for any discharge medications will be authorized once you are discharged, as it is imperative that you return to your primary care physician (or establish a relationship with a primary care physician if you do not have one) for your aftercare needs so that they can reassess your need for medications and monitor your lab values.   Quit smoking  Keep yourself well hydrated  Take prednisone 60 mg daily for 5 days and return to your previous schedule after that  [place] [facility]

## 2015-03-26 DIAGNOSIS — R55 Syncope and collapse: Secondary | ICD-10-CM | POA: Diagnosis not present

## 2015-03-26 NOTE — Progress Notes (Signed)
[facility] Encounter Note  Patient: Mackenzie Rose / Admit Date: 03/25/2015 / Date of Encounter: 03/26/2015, 7:28 AM   Subjective: No evidence of chest pain syncope or shortness of breath  Review of Systems: Positive for: Weakness Negative for: Vision change, hearing change, syncope, dizziness, nausea, vomiting,diarrhea, bloody stool, stomach pain, cough, congestion, diaphoresis, urinary frequency, urinary pain,skin lesions, skin rashes Others previously listed  Objective: Telemetry: Normal sinus rhythm Physical Exam: Blood pressure 157/63, pulse 75, temperature 97.9 F (36.6 C), temperature source Oral, resp. rate 18, height 5\' 3"  (1.6 m), weight 139 lb 14.4 oz (63.458 kg), SpO2 92 %. Body mass index is 24.79 kg/(m^2). General: Well developed, well nourished, in no acute distress. Head: Normocephalic, atraumatic, sclera non-icteric, no xanthomas, nares are without discharge. Neck: No apparent masses Lungs: Normal respirations with no wheezes, no rhonchi, no rales , no crackles   Heart: Regular rate and rhythm, normal S1 S2, no murmur, no rub, no gallop, PMI is normal size and placement, carotid upstroke normal without bruit, jugular venous pressure normal Abdomen: Soft, non-tender, non-distended with normoactive bowel sounds. No hepatosplenomegaly. Abdominal aorta is normal size without bruit Extremities: No edema, no clubbing, no cyanosis, no ulcers,  Peripheral: 2+ radial, 2+ femoral, 2+ dorsal pedal pulses Neuro: Alert and oriented. Moves all extremities spontaneously. Psych:  Responds to questions appropriately with a normal affect.   Intake/Output Summary (Last 24 hours) at 03/26/15 0728 Last data filed at 03/25/15 2318  Gross per 24 hour  Intake    600 ml  Output    401 ml  Net    199 ml    Inpatient Medications:  . busPIRone  10 mg Oral BID  . carvedilol  3.125 mg Oral BID WC  . chlorpheniramine-HYDROcodone  5 mL Oral Q12H  . diltiazem  120 mg  Oral Daily  . docusate sodium  100 mg Oral BID  . donepezil  5 mg Oral QHS  . doxycycline  100 mg Oral Q12H  . heparin  5,000 Units Subcutaneous 3 times per day  . ipratropium-albuterol  3 mL Nebulization Q4H  . lamoTRIgine  100 mg Oral BID  . latanoprost  1 drop Both Eyes QHS  . mometasone-formoterol  2 puff Inhalation BID  . multivitamin with minerals  1 tablet Oral Q24H  . nicotine  7 mg Transdermal Daily  . pravastatin  20 mg Oral Daily  . predniSONE  30 mg Oral QODAY  . pyridostigmine  60 mg Oral TID  . QUEtiapine  200 mg Oral QHS  . sodium chloride  3 mL Intravenous Q12H  . traZODone  50 mg Oral QHS   Infusions:    Labs:  Recent Labs  03/25/15 0127 03/25/15 1455  NA 132* 131*  K 2.8* 4.1  CL 94* 97*  CO2 27 27  GLUCOSE 185* 241*  BUN 30* 29*  CREATININE 2.06* 1.54*  CALCIUM 8.4* 7.8*    Recent Labs  03/25/15 0127  AST 32  ALT 14  ALKPHOS 68  BILITOT 0.3  PROT 5.7*  ALBUMIN 3.3*    Recent Labs  03/25/15 0127  WBC 12.1*  NEUTROABS 10.2*  HGB 8.8*  HCT 28.5*  MCV 75.6*  PLT 185    Recent Labs  03/25/15 0127 03/25/15 0741 03/25/15 1306 03/25/15 1914  TROPONINI 0.08* 0.07* 0.06* 0.05*   Invalid input(s): POCBNP  Recent Labs  03/25/15 0741  HGBA1C 6.5*     Weights: Filed Weights   03/25/15 0650 03/26/15 0559  Weight:  131 lb 6.4 oz (59.603 kg) 139 lb 14.4 oz (63.458 kg)     Radiology/Studies:  Dg Chest Port 1 View  03/25/2015   CLINICAL DATA:  Weakness and nausea, walked into door frame. Recent hospital admission for pneumonia. History of heart disease, atrial fibrillation, COPD, diabetes, hypertension.  EXAM: PORTABLE CHEST - 1 VIEW  COMPARISON:  Chest radiograph January 01, 2015  FINDINGS: Cardiac silhouette is mildly enlarged. Tortuous, possibly ectatic aorta. Moderate hiatal hernia. Mildly elevated RIGHT hemidiaphragm with hepatic eventration. RIGHT lung base strandy densities. No pleural effusion. No pneumothorax. Soft tissue planes  and included osseous structure nonsuspicious.  IMPRESSION: Mild cardiomegaly.  RIGHT lung base atelectasis/scarring.   Electronically Signed   By: Elon Alas M.D.   On: 03/25/2015 02:01     Assessment and Recommendation  69 y.o. female with episode of fall and or syncope with known mixed hyperlipidemia diabetes with abnormal EKG and minimal elevation of troponin most consistent with demand ischemia without evidence of acute coronary syndrome and/or myocardial infarction 1. Begin ambulation and follow for any rhythm disturbances and or chest discomfort 2. Continue medication management for hypertension hyperlipidemia and diabetes 3. Okay for discharge to home if no further significant symptoms with outpatient stress test and/or other evaluation as necessary with Dr. Nehemiah Massed  4. Okay for discharge to home if ambulating well  Signed, Serafina Royals M.D. FACC

## 2015-03-26 NOTE — Plan of Care (Signed)
Problem: Discharge Progression Outcomes Goal: Other Discharge Outcomes/Goals Outcome: Completed/Met Date Met:  03/26/15 Pt is alert and oriented x 4, c/o back pain with movement improved with tramadol, up to bathroom with stand by assist, on room air, vital signs remain stable, good appetite, d/c to [facility] with [facility] health, patients care giver from assisted living arrived prior to report giving called, d/c instructions reviewed with patient. No syncope episodes throughout shift. Uneventful shift.

## 2015-03-26 NOTE — Care Management (Signed)
Found that patient is currently open to [facility] for physical therapy.  Patient to discharge with resumption of this service and addition of nursing to follow.  Requested order from attending in addition to Face to Face.

## 2015-03-26 NOTE — Clinical Social Work Note (Signed)
CSW notified pt, RN, and facility that pt would DC today.  Estill Bamberg stated that she would be able to transport pt back to the facility.  CSW signing off unless further needs arise.

## 2015-03-26 NOTE — Discharge Summary (Signed)
[facility] [place] NAME: Mackenzie Rose    MR#:  [id number]  DATE OF BIRTH:  1945-10-26  DATE OF ADMISSION:  03/25/2015 ADMITTING PHYSICIAN: Harrie Foreman, MD  DATE OF DISCHARGE: No discharge date for patient encounter.  PRIMARY CARE PHYSICIAN: Lavera Guise, MD    ADMISSION DIAGNOSIS:  Cough [R05] Hypokalemia [E87.6] Elevated troponin [R79.89] Hypotension, unspecified hypotension type [I95.9]  DISCHARGE DIAGNOSIS:  Active Problems:   Near syncope   ARF (acute renal failure)   COPD exacerbation   SECONDARY DIAGNOSIS:   Past Medical History  Diagnosis Date  . Heart disease   . Atrial fibrillation     a. reported a-fib; b. unknown chronicity; c. dates back to 1970s; d. not on long term anticoagulation 2/2 no documented a-fib  . Hypertension   . High cholesterol   . Diabetes     borderline  . COPD (chronic obstructive pulmonary disease)   . Melanoma   . Kidney disorder   . Tremor   . Autoimmune disease   . Depressed   . Anxiety disorder   . Migraine   . Dementia   . Motion sickness   . Myasthenia gravis   . Bipolar disorder   . Dementia      ADMITTING HISTORY  Chief Complaint: Dizziness HPI: The patient presents emergency department via EMS after "running into a wall" while attempting to hurry to the bathroom. The patient admits that she's been feeling dizzy and that she has become short of breath with cough. She is short of breath at baseline but has become more so lately. Her cough has worsened and is causing abdominal pain as well. His evening the patient became diaphoretic with her shortness of breath but denies any chest pain. She was hospitalized one month ago for pneumonia. Laboratory evaluation in the emergency department revealed acute on chronic kidney injury in addition to a mildly elevated troponin. She was also found to be hypotensive which prompted the emergency department to call for  admission.   HOSPITAL COURSE:   * Acute COPD exacerbation * Dehydration * Acute renal failure * Mild elevation in troponin  Patient was admitted onto telemetry floor. No arrhythmias found. Troponin was stable. Seen by cardiology. Echocardiogram showed no wall motion abnormalities. With IV fluids creatinine has returned to normal.. Treated with IV steroid-induced, antibiotics and scheduled nebulizer therapy. Switch to prednisone 60 mg daily for 5 days and then patient can return to work 30 mg every other day dose.[place]: Daily for 5 days. Levaquin not prescribed due to concern for a myasthenic crisis.  Stable at time of discharge   CONSULTS OBTAINED:  Treatment Team:  Corey Skains, MD  DRUG ALLERGIES:   Allergies  Allergen Reactions  . Lactose   . Lithium   . Penicillins Rash    DISCHARGE MEDICATIONS:   Current Discharge Medication List    START taking these medications   Details  !! albuterol (PROVENTIL HFA;VENTOLIN HFA) 108 (90 BASE) MCG/ACT inhaler Inhale 2 puffs into the lungs every 4 (four) hours as needed for wheezing or shortness of breath. Qty: 6.7 g, Refills: 1    chlorpheniramine-HYDROcodone (TUSSIONEX) 10-8 MG/5ML SUER Take 5 mLs by mouth every 12 (twelve) hours as needed for cough. Qty: 115 mL, Refills: 0    doxycycline (VIBRAMYCIN) 50 MG capsule Take 1 capsule (50 mg total) by mouth 2 (two) times daily. Qty: 10 capsule, Refills: 0    nicotine (NICODERM CQ -  DOSED IN MG/24 HR) 7 mg/24hr patch Place 1 patch (7 mg total) onto the skin daily. Qty: 15 patch, Refills: 0    !! predniSONE (DELTASONE) 20 MG tablet Take 3 tablets (60 mg total) by mouth daily with breakfast. Qty: 15 tablet, Refills: 0     !! - Potential duplicate medications found. Please discuss with provider.    CONTINUE these medications which have NOT CHANGED   Details  acetaminophen (TYLENOL) 650 MG CR tablet Take 650 mg by mouth daily. Takes around 1400 every day    !! albuterol  (PROVENTIL HFA;VENTOLIN HFA) 108 (90 BASE) MCG/ACT inhaler Inhale 2 puffs into the lungs every 6 (six) hours as needed for shortness of breath.    busPIRone (BUSPAR) 5 MG tablet Take 10 mg by mouth 2 (two) times daily.     carvedilol (COREG) 3.125 MG tablet Take 3.125 mg by mouth 2 (two) times daily.     diclofenac sodium (VOLTAREN) 1 % GEL Apply 2 g topically 2 (two) times daily.     diltiazem (CARDIZEM CD) 180 MG 24 hr capsule Take 120 mg by mouth daily.     donepezil (ARICEPT) 10 MG tablet Take 5 mg by mouth daily.     Fluticasone-Salmeterol (ADVAIR) 250-50 MCG/DOSE AEPB Inhale 1 puff into the lungs 2 (two) times daily.    glucagon 1 MG injection Inject 1 mg into the vein as needed (if sugars are below 40).     indapamide (LOZOL) 1.25 MG tablet Take 1.25 mg by mouth daily.     lamoTRIgine (LAMICTAL) 100 MG tablet Take 100 mg by mouth 2 (two) times daily.    latanoprost (XALATAN) 0.005 % ophthalmic solution Place 1 drop into both eyes at bedtime.     losartan (COZAAR) 25 MG tablet Take 25 mg by mouth daily.    Multiple Vitamins-Minerals (MULTIVITAMIN WITH MINERALS) tablet Take 1 tablet by mouth daily.    !! predniSONE (DELTASONE) 10 MG tablet Take 3 tablets (30 mg total) by mouth every other day. Qty: 90 tablet, Refills: 3    pyridostigmine (MESTINON) 60 MG tablet Take 60 mg by mouth 3 (three) times daily.     QUEtiapine (SEROQUEL) 200 MG tablet Take 200 mg by mouth at bedtime.    sodium chloride (OCEAN) 0.65 % SOLN nasal spray Place 2 sprays into both nostrils daily.     traMADol (ULTRAM) 50 MG tablet Take 50 mg by mouth 2 (two) times daily.     traZODone (DESYREL) 50 MG tablet Take 50 mg by mouth at bedtime. Take 1/2 at bedtime.     !! - Potential duplicate medications found. Please discuss with provider.    STOP taking these medications     amLODipine (NORVASC) 5 MG tablet      Calcium Carbonate-Vitamin D (CALCIUM 500 + D PO)      ibuprofen (ADVIL,MOTRIN) 400 MG  tablet      Multiple Vitamin (MULTIVITAMIN) tablet      pravastatin (PRAVACHOL) 20 MG tablet          Today    VITAL SIGNS:  Blood pressure 173/69, pulse 84, temperature 98.3 F (36.8 C), temperature source Oral, resp. rate 20, height 5\' 3"  (1.6 m), weight 63.458 kg (139 lb 14.4 oz), SpO2 94 %.  I/O:   Intake/Output Summary (Last 24 hours) at 03/26/15 1424 Last data filed at 03/26/15 1345  Gross per 24 hour  Intake    600 ml  Output   1176 ml  Net   -  576 ml    PHYSICAL EXAMINATION:  Physical Exam  GENERAL:  69 y.o.-year-old patient lying in the bed with no acute distress.  LUNGS: Mild wheezing. Normal work of breathing. CARDIOVASCULAR: S1, S2 normal. No murmurs, rubs, or gallops.  ABDOMEN: Soft, non-tender, non-distended. Bowel sounds present. No organomegaly or mass.  NEUROLOGIC: Moves all 4 extremities. PSYCHIATRIC: The patient is alert and oriented x 3.  SKIN: No obvious rash, lesion, or ulcer.   DATA REVIEW:   CBC  Recent Labs Lab 03/25/15 0127  WBC 12.1*  HGB 8.8*  HCT 28.5*  PLT 185    Chemistries   Recent Labs Lab 03/25/15 0127 03/25/15 1455  NA 132* 131*  K 2.8* 4.1  CL 94* 97*  CO2 27 27  GLUCOSE 185* 241*  BUN 30* 29*  CREATININE 2.06* 1.54*  CALCIUM 8.4* 7.8*  AST 32  --   ALT 14  --   ALKPHOS 68  --   BILITOT 0.3  --     Cardiac Enzymes  Recent Labs Lab 03/25/15 1914  TROPONINI 0.05*    Microbiology Results  Results for orders placed or performed during the hospital encounter of 03/25/15  Blood culture (routine x 2)     Status: None (Preliminary result)   Collection Time: 03/25/15  1:28 AM  Result Value Ref Range Status   Specimen Description BLOOD  Final   Special Requests BOTTLES DRAWN AEROBIC AND ANAEROBIC 5CC  Final   Culture NO GROWTH 1 DAY  Final   Report Status PENDING  Incomplete  Blood culture (routine x 2)     Status: None (Preliminary result)   Collection Time: 03/25/15  1:38 AM  Result Value Ref Range  Status   Specimen Description BLOOD RIGHT HAND  Final   Special Requests BOTTLES DRAWN AEROBIC AND ANAEROBIC 5CC  Final   Culture NO GROWTH 1 DAY  Final   Report Status PENDING  Incomplete    RADIOLOGY:  Dg Chest Port 1 View  03/25/2015   CLINICAL DATA:  Weakness and nausea, walked into door frame. Recent hospital admission for pneumonia. History of heart disease, atrial fibrillation, COPD, diabetes, hypertension.  EXAM: PORTABLE CHEST - 1 VIEW  COMPARISON:  Chest radiograph January 01, 2015  FINDINGS: Cardiac silhouette is mildly enlarged. Tortuous, possibly ectatic aorta. Moderate hiatal hernia. Mildly elevated RIGHT hemidiaphragm with hepatic eventration. RIGHT lung base strandy densities. No pleural effusion. No pneumothorax. Soft tissue planes and included osseous structure nonsuspicious.  IMPRESSION: Mild cardiomegaly.  RIGHT lung base atelectasis/scarring.   Electronically Signed   By: Elon Alas M.D.   On: 03/25/2015 02:01      Follow up with PCP in 1 week.  Management plans discussed with the patient, family and they are in agreement.  CODE STATUS:     Code Status Orders        Start     Ordered   03/25/15 0715  Full code   Continuous     03/25/15 0714      TOTAL TIME TAKING CARE OF THIS PATIENT ON DAY OF DISCHARGE: more than 30 minutes.    Hillary Bow R M.D on 03/26/2015 at 2:24 PM  Between 7am to 6pm - Pager - [phone]  After 6pm go to [email or web address] - password [email or web address] [facility]  Office  [phone]  CC: Primary care physician; Lavera Guise, MD

## 2015-03-26 NOTE — Clinical Social Work Note (Signed)
Clinical Social Work Assessment  Patient Details  Name: Mackenzie Rose MRN: [id number] Date of Birth: [birth year]  Date of referral:  03/26/15               Reason for consult:  Facility Placement (pt is from [place])                Permission sought to share information with:  Facility Art therapist granted to share information::  Yes, Verbal Permission Granted  Name::     [facility] [phone]::     Relationship::     Contact Information:     Housing/Transportation Living arrangements for the past 2 months:  [place] of Information:  Patient, Facility Patient Interpreter Needed:  None Criminal Activity/Legal Involvement Pertinent to Current Situation/Hospitalization:  No - Comment as needed Significant Relationships:  Other Family Members Lives with:  Facility Resident Do you feel safe going back to the place where you live?  Yes Need for family participation in patient care:  No (Coment)  Care giving concerns:  Current concerns are getting pt back to her ALF once she is medically stable.  Facility stated that they would be able to transport if DC was today.     Social Worker assessment / plan:  CSW spoke to pt.  She was alert and Ox4.  She verified that she was from ALF [facility].  She stated that she was able to use a walker before, but had recently become weak with her recent illness.  She stated that she did not want to notify her family of her condition, but did give CSW permission to speak to facility.  CSW did confirm that pt was from ALF.  Per facility they can assist with transportation today.  Employment status:  Retired, Disabled (Comment on whether or not currently receiving Disability) Insurance information:  Medicare PT Recommendations:    Information / Referral to community resources:     Patient/Family's Response to care:  Pt's response to DC plan was positive.  SHe was in favor of DC back  to ALF   Patient/Family's Understanding of and Emotional Response to Diagnosis, Current Treatment, and Prognosis:  Pt stated that she understood and was in favor of DC to ALF.    Emotional Assessment Appearance:  Appears stated age Attitude/Demeanor/Rapport:   (pleasent and talkative) Affect (typically observed):   (appropriate) Orientation:  Oriented to Self, Oriented to Place, Oriented to  Time, Oriented to Situation Alcohol / Substance use:  Alcohol Use Psych involvement (Current and /or in the community):  No (Comment)  Discharge Needs  Concerns to be addressed:  Care Coordination Readmission within the last 30 days:  No Current discharge risk:  None Barriers to Discharge:  No Barriers Identified   Mathews Argyle, LCSW 03/26/2015, 9:58 AM

## 2015-03-26 NOTE — Care Management (Signed)
[facility]  Admitted under observation for weakness and fatigue.  Found to be hypotensive  and orthostatic most likely due to dehydration.  Had mildly elevated troponin and has been cleared by cardiology.  Patient may benefit from home health nusre follow up at the family care home.  Has been followed by [facility] in the recent past.

## 2015-03-29 ENCOUNTER — Encounter: Payer: Self-pay | Admitting: Psychiatry

## 2015-03-29 ENCOUNTER — Ambulatory Visit (INDEPENDENT_AMBULATORY_CARE_PROVIDER_SITE_OTHER): Payer: Medicare Other | Admitting: Psychiatry

## 2015-03-29 VITALS — BP 118/82 | HR 91 | Temp 98.7°F | Ht 63.5 in | Wt 138.2 lb

## 2015-03-29 DIAGNOSIS — F03918 Unspecified dementia, unspecified severity, with other behavioral disturbance: Secondary | ICD-10-CM

## 2015-03-29 DIAGNOSIS — F331 Major depressive disorder, recurrent, moderate: Secondary | ICD-10-CM

## 2015-03-29 DIAGNOSIS — F0391 Unspecified dementia with behavioral disturbance: Secondary | ICD-10-CM

## 2015-03-29 MED ORDER — DONEPEZIL HCL 10 MG PO TABS: 10.0000 mg | ORAL_TABLET | Freq: Every day | ORAL | Status: AC

## 2015-03-29 MED ORDER — QUETIAPINE FUMARATE 200 MG PO TABS: 200.0000 mg | ORAL_TABLET | Freq: Every day | ORAL | Status: AC

## 2015-03-29 MED ORDER — TRAZODONE HCL 50 MG PO TABS: 50.0000 mg | ORAL_TABLET | Freq: Every day | ORAL | Status: DC

## 2015-03-29 MED ORDER — BUSPIRONE HCL 5 MG PO TABS: 10.0000 mg | ORAL_TABLET | Freq: Two times a day (BID) | ORAL | Status: DC

## 2015-03-29 MED ORDER — LAMOTRIGINE 100 MG PO TABS: 100.0000 mg | ORAL_TABLET | Freq: Two times a day (BID) | ORAL | Status: DC
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
## 2015-03-30 LAB — CULTURE, BLOOD (ROUTINE X 2)
Culture: NO GROWTH
Culture: NO GROWTH

## 2015-04-23 ENCOUNTER — Emergency Department: Payer: Medicare Other

## 2015-04-23 ENCOUNTER — Emergency Department
Admission: EM | Admit: 2015-04-23 | Discharge: 2015-04-25 | Disposition: A | Discharge status: Home / Self Care | Payer: Medicare Other | Attending: Emergency Medicine | Admitting: Emergency Medicine

## 2015-04-23 ENCOUNTER — Encounter: Payer: Self-pay | Admitting: Emergency Medicine

## 2015-04-23 DIAGNOSIS — Z79899 Other long term (current) drug therapy: Secondary | ICD-10-CM | POA: Diagnosis not present

## 2015-04-23 DIAGNOSIS — E119 Type 2 diabetes mellitus without complications: Secondary | ICD-10-CM | POA: Diagnosis not present

## 2015-04-23 DIAGNOSIS — R809 Proteinuria, unspecified: Secondary | ICD-10-CM | POA: Insufficient documentation

## 2015-04-23 DIAGNOSIS — Z720 Tobacco use: Secondary | ICD-10-CM | POA: Diagnosis not present

## 2015-04-23 DIAGNOSIS — E876 Hypokalemia: Secondary | ICD-10-CM | POA: Insufficient documentation

## 2015-04-23 DIAGNOSIS — R55 Syncope and collapse: Secondary | ICD-10-CM | POA: Insufficient documentation

## 2015-04-23 DIAGNOSIS — R45851 Suicidal ideations: Secondary | ICD-10-CM | POA: Insufficient documentation

## 2015-04-23 DIAGNOSIS — Z7951 Long term (current) use of inhaled steroids: Secondary | ICD-10-CM | POA: Diagnosis not present

## 2015-04-23 DIAGNOSIS — Z880 Allergy status to penicillin: Secondary | ICD-10-CM | POA: Insufficient documentation

## 2015-04-23 DIAGNOSIS — I10 Essential (primary) hypertension: Secondary | ICD-10-CM | POA: Insufficient documentation

## 2015-04-23 DIAGNOSIS — R0602 Shortness of breath: Secondary | ICD-10-CM | POA: Diagnosis present

## 2015-04-23 DIAGNOSIS — Z7952 Long term (current) use of systemic steroids: Secondary | ICD-10-CM | POA: Diagnosis not present

## 2015-04-23 DIAGNOSIS — E860 Dehydration: Secondary | ICD-10-CM | POA: Insufficient documentation

## 2015-04-23 LAB — CBC WITH DIFFERENTIAL/PLATELET
Basophils Absolute: 0.1 10*3/uL (ref 0–0.1)
Basophils Relative: 0 %
Eosinophils Absolute: 0 10*3/uL (ref 0–0.7)
Eosinophils Relative: 0 %
HEMATOCRIT: 29.9 % — AB (ref 35.0–47.0)
Hemoglobin: 9 g/dL — ABNORMAL LOW (ref 12.0–16.0)
LYMPHS ABS: 0.8 10*3/uL — AB (ref 1.0–3.6)
LYMPHS PCT: 5 %
MCH: 21.3 pg — ABNORMAL LOW (ref 26.0–34.0)
MCHC: 30.1 g/dL — ABNORMAL LOW (ref 32.0–36.0)
MCV: 70.8 fL — AB (ref 80.0–100.0)
MONO ABS: 0.5 10*3/uL (ref 0.2–0.9)
Monocytes Relative: 4 %
NEUTROS ABS: 12.9 10*3/uL — AB (ref 1.4–6.5)
Neutrophils Relative %: 91 %
Platelets: 304 10*3/uL (ref 150–440)
RBC: 4.23 MIL/uL (ref 3.80–5.20)
RDW: 19.6 % — ABNORMAL HIGH (ref 11.5–14.5)
WBC: 14.2 10*3/uL — AB (ref 3.6–11.0)

## 2015-04-23 LAB — BASIC METABOLIC PANEL
ANION GAP: 13 (ref 5–15)
BUN: 29 mg/dL — ABNORMAL HIGH (ref 6–20)
CHLORIDE: 99 mmol/L — AB (ref 101–111)
CO2: 25 mmol/L (ref 22–32)
Calcium: 8.9 mg/dL (ref 8.9–10.3)
Creatinine, Ser: 1.86 mg/dL — ABNORMAL HIGH (ref 0.44–1.00)
GFR calc Af Amer: 30 mL/min — ABNORMAL LOW (ref >60)
GFR, EST NON AFRICAN AMERICAN: 26 mL/min — AB (ref >60)
GLUCOSE: 162 mg/dL — AB (ref 65–99)
POTASSIUM: 2.9 mmol/L — AB (ref 3.5–5.1)
Sodium: 137 mmol/L (ref 135–145)

## 2015-04-23 LAB — URINALYSIS COMPLETE WITH MICROSCOPIC (ARMC ONLY)
BACTERIA UA: NONE SEEN
Bilirubin Urine: NEGATIVE
Glucose, UA: 50 mg/dL — AB
Hgb urine dipstick: NEGATIVE
Ketones, ur: NEGATIVE mg/dL
Nitrite: NEGATIVE
PH: 7 (ref 5.0–8.0)
Specific Gravity, Urine: 1.011 (ref 1.005–1.030)

## 2015-04-23 MED ORDER — POTASSIUM CHLORIDE 20 MEQ PO PACK
40.0000 meq | PACK | Freq: Once | ORAL | Status: AC
  Administered 2015-04-23: 40 meq via ORAL
  Filled 2015-04-23: qty 2

## 2015-04-23 MED ORDER — IPRATROPIUM-ALBUTEROL 0.5-2.5 (3) MG/3ML IN SOLN
3.0000 mL | Freq: Once | RESPIRATORY_TRACT | Status: AC
  Administered 2015-04-23: 3 mL via RESPIRATORY_TRACT
  Filled 2015-04-23: qty 3

## 2015-04-23 MED ORDER — DILTIAZEM HCL ER COATED BEADS 180 MG PO CP24
180.0000 mg | ORAL_CAPSULE | Freq: Every day | ORAL | Status: DC
Start: 2015-04-23 — End: 2015-04-23

## 2015-04-23 MED ORDER — HYDRALAZINE HCL 10 MG PO TABS
10.0000 mg | ORAL_TABLET | Freq: Three times a day (TID) | ORAL | Status: DC
  Administered 2015-04-23 – 2015-04-24 (×3): 10 mg via ORAL
  Filled 2015-04-23 (×7): qty 1

## 2015-04-23 MED ORDER — DILTIAZEM HCL ER COATED BEADS 180 MG PO CP24
180.0000 mg | ORAL_CAPSULE | Freq: Every day | ORAL | Status: DC
  Administered 2015-04-24: 180 mg via ORAL
  Filled 2015-04-23 (×2): qty 1

## 2015-04-23 MED ORDER — HYDRALAZINE HCL 25 MG PO TABS
ORAL_TABLET | ORAL | Status: AC
  Administered 2015-04-23: 10 mg via ORAL
  Filled 2015-04-23: qty 1

## 2015-04-23 NOTE — ED Notes (Signed)
Heard the sound of gagging and went into pts room. Pt was gagging herself with drinking straw. Removed straw from pts reach, assessed her airway, notified her RN

## 2015-04-23 NOTE — ED Notes (Signed)
Called into the RM by [employer], states he found the pt with her cup of water in hand with straw half way down throat, gagging herself..when I asked the pt why she did it "I just want to end this shit".the patient clarified, stating she wants to die.  MD notified.Marland Kitchen

## 2015-04-23 NOTE — ED Notes (Signed)
BEHAVIORAL HEALTH ROUNDING Patient sleeping: No. Patient alert and oriented: yes Behavior appropriate: Yes.  ; If no, describe:  Nutrition and fluids offered: Yes  Toileting and hygiene offered: Yes  Sitter present: no Law enforcement present: Yes  

## 2015-04-23 NOTE — ED Provider Notes (Signed)
[facility] Emergency Department Provider Note ____________________________________________  Time seen: Approximately 3:48 PM  I have reviewed the triage vital signs and the nursing notes.   HISTORY  Chief Complaint Shortness of Breath  History is limited due to dementia  HPI Mackenzie Rose is a 69 y.o. female history of dementia, myasthenia, hypertension, heart disease who presents from her nursing home via EMS for evaluation of shortness of breath. On exam patient is complaining of feeling like she can't get all of her urine out. She denies feeling short of breath or any chest pain.     Past Medical History  Diagnosis Date  . Heart disease   . Atrial fibrillation     a. reported a-fib; b. unknown chronicity; c. dates back to 1970s; d. not on long term anticoagulation 2/2 no documented a-fib  . Hypertension   . High cholesterol   . Diabetes     borderline  . COPD (chronic obstructive pulmonary disease)   . Melanoma   . Kidney disorder   . Tremor   . Autoimmune disease   . Depressed   . Anxiety disorder   . Migraine   . Dementia   . Motion sickness   . Myasthenia gravis   . Bipolar disorder   . Dementia   . Acute renal failure   . Near syncope     Patient Active Problem List   Diagnosis Date Noted  . Near syncope 03/25/2015  . ARF (acute renal failure) 03/25/2015  . COPD exacerbation 03/25/2015  . Anxiety 12/12/2014  . Bipolar affective disorder, mixed 12/12/2014  . Renal insufficiency syndrome 12/12/2014  . H/O: HTN (hypertension) 12/12/2014  . H/O diabetes mellitus 12/12/2014  . CAFL (chronic airflow limitation) 12/12/2014  . Bipolar disorder   . Dementia   . Ulnar neuropathy at elbow of right upper extremity 09/22/2014  . Myasthenia gravis 11/22/2013    Past Surgical History  Procedure Laterality Date  . Tubal ligation    . Retena repair    . Cataract extraction    . Cholecystectomy      Current Outpatient Rx  Name  Route   Sig  Dispense  Refill  . acetaminophen (TYLENOL) 650 MG CR tablet   Oral   Take 650 mg by mouth daily at 2 PM daily at 2 PM. Pt takes daily at 2pm and every four hours as needed for pain.         Marland Kitchen albuterol (PROVENTIL HFA;VENTOLIN HFA) 108 (90 BASE) MCG/ACT inhaler   Inhalation   Inhale 2 puffs into the lungs every 4 (four) hours as needed for wheezing or shortness of breath.   6.7 g   1   . bismuth subsalicylate (PEPTO BISMOL) 262 MG/15ML suspension   Oral   Take 5 mLs by mouth 4 (four) times daily as needed for diarrhea or loose stools (or nausea).         . busPIRone (BUSPAR) 5 MG tablet   Oral   Take 2 tablets (10 mg total) by mouth 2 (two) times daily.   30 tablet   3   . chlorpheniramine-HYDROcodone (TUSSIONEX) 10-8 MG/5ML SUER   Oral   Take 5 mLs by mouth every 12 (twelve) hours as needed for cough.   115 mL   0   . Dextromethorphan-Benzocaine (SORE THROAT & COUGH LOZENGES MT)   Mouth/Throat   Use as directed 1 lozenge in the mouth or throat 4 (four) times daily as needed (for cough).         Marland Kitchen  diclofenac sodium (VOLTAREN) 1 % GEL   Topical   Apply 2 g topically 2 (two) times daily.          Marland Kitchen diltiazem (CARDIZEM CD) 180 MG 24 hr capsule   Oral   Take 180 mg by mouth daily.          Marland Kitchen donepezil (ARICEPT) 10 MG tablet   Oral   Take 1 tablet (10 mg total) by mouth daily.   30 tablet   3   . Fluticasone-Salmeterol (ADVAIR) 250-50 MCG/DOSE AEPB   Inhalation   Inhale 1 puff into the lungs 2 (two) times daily.         Marland Kitchen glimepiride (AMARYL) 1 MG tablet   Oral   Take 1 mg by mouth 2 (two) times daily with a meal.         . guaifenesin (ROBITUSSIN) 100 MG/5ML syrup   Oral   Take 100 mg by mouth 2 (two) times daily as needed for cough.         . hydrALAZINE (APRESOLINE) 10 MG tablet   Oral   Take 10 mg by mouth 3 (three) times daily.         Marland Kitchen lamoTRIgine (LAMICTAL) 100 MG tablet   Oral   Take 1 tablet (100 mg total) by mouth 2 (two)  times daily.   60 tablet   3   . latanoprost (XALATAN) 0.005 % ophthalmic solution   Both Eyes   Place 1 drop into both eyes at bedtime.          . nicotine (NICODERM CQ - DOSED IN MG/24 HR) 7 mg/24hr patch   Transdermal   Place 1 patch (7 mg total) onto the skin daily.   15 patch   0   . phenylephrine-shark liver oil-mineral oil-petrolatum (PREPARATION H) 0.25-3-14-71.9 % rectal ointment   Rectal   Place 1 application rectally 2 (two) times daily as needed for hemorrhoids.         . polyethylene glycol (MIRALAX / GLYCOLAX) packet   Oral   Take 17 g by mouth daily as needed for mild constipation.         . predniSONE (DELTASONE) 20 MG tablet   Oral   Take 30 mg by mouth every other day.         . pyridostigmine (MESTINON) 60 MG tablet   Oral   Take 60 mg by mouth 3 (three) times daily.          . QUEtiapine (SEROQUEL) 200 MG tablet   Oral   Take 1 tablet (200 mg total) by mouth at bedtime.   30 tablet   3   . sodium chloride (OCEAN) 0.65 % SOLN nasal spray   Each Nare   Place 2 sprays into both nostrils daily as needed for congestion.          . traMADol (ULTRAM) 50 MG tablet   Oral   Take 50 mg by mouth 2 (two) times daily.          . traZODone (DESYREL) 50 MG tablet   Oral   Take 1 tablet (50 mg total) by mouth at bedtime. Take 1/2 at bedtime. Patient taking differently: Take 50 mg by mouth at bedtime.    30 tablet   2   . doxycycline (VIBRAMYCIN) 50 MG capsule   Oral   Take 1 capsule (50 mg total) by mouth 2 (two) times daily. Patient not taking: Reported on 04/23/2015   10 capsule  0   . predniSONE (DELTASONE) 10 MG tablet   Oral   Take 3 tablets (30 mg total) by mouth every other day. Patient not taking: Reported on 03/29/2015   90 tablet   3   . predniSONE (DELTASONE) 20 MG tablet   Oral   Take 3 tablets (60 mg total) by mouth daily with breakfast. Patient not taking: Reported on 03/29/2015   15 tablet   0      Allergies Lactose; Lithium; and Penicillins  Family History  Problem Relation Age of Onset  . Heart disease Father   . Other Mother     ruptured hernia  . Breast cancer Sister   . Heart disease Sister   . Diabetes Mellitus II Sister     x2    Social History Social History  Substance Use Topics  . Smoking status: Current Some Day Smoker -- 1.00 packs/day for 50 years    Types: Cigarettes    Start date: 03/28/1965  . Smokeless tobacco: Never Used  . Alcohol Use: No     Comment: Occasionally    Review of Systems Constitutional: No fever Cardiovascular: Denies chest pain. RGastrointestinal: No abdominal pain.   10-point ROS otherwise negative.  ____________________________________________   PHYSICAL EXAM:  VITAL SIGNS:BP 169/82 mmHg  Pulse 90  Temp(Src) 98.3 F (36.8 C) (Oral)  Ht 4\' 11"  (1.499 m)  Wt 132 lb (59.875 kg)  BMI 26.65 kg/m2  SpO2 100%   Constitutional: Alert and responsive. Well appearing and in no acute distress. Eyes: Conjunctivae are normal. PERRL. EOMI. Head: Atraumatic. Nose: No congestion/rhinnorhea. Mouth/Throat: Mucous membranes are moist.  Oropharynx non-erythematous. Neck: No stridor.   Lymphatic: No cervical lymphadenopathy. Cardiovascular: Normal rate, regular rhythm. Grossly normal heart sounds.  Peripheral pulses 2+ B Respiratory: Normal respiratory effort.  No retractions. Lungs CTAB. Gastrointestinal: Soft and nontender. No distention. Normal bowel sounds. No palpable fullness of the bladder or suprapubic tenderness Musculoskeletal: No lower extremity tenderness nor edema.  No calf TTP. Neurologic:  Normal speech and language. No gross focal neurologic deficits are appreciated. Speech is normal.  Skin:  Skin is warm, dry and intact. No rash noted. Psychiatric: Mood and affect are normal. Speech and behavior are normal.  ____________________________________________   LABS (all labs ordered are listed, but only abnormal  results are displayed)  Labs Reviewed  BASIC METABOLIC PANEL - Abnormal; Notable for the following:    Potassium 2.9 (*)    Chloride 99 (*)    Glucose, Bld 162 (*)    BUN 29 (*)    Creatinine, Ser 1.86 (*)    GFR calc non Af Amer 26 (*)    GFR calc Af Amer 30 (*)    All other components within normal limits  CBC WITH DIFFERENTIAL/PLATELET - Abnormal; Notable for the following:    WBC 14.2 (*)    Hemoglobin 9.0 (*)    HCT 29.9 (*)    MCV 70.8 (*)    MCH 21.3 (*)    MCHC 30.1 (*)    RDW 19.6 (*)    Neutro Abs 12.9 (*)    Lymphs Abs 0.8 (*)    All other components within normal limits  URINALYSIS COMPLETEWITH MICROSCOPIC ([facility] ONLY) - Abnormal; Notable for the following:    Color, Urine YELLOW (*)    APPearance CLEAR (*)    Glucose, UA 50 (*)    Protein, ur >500 (*)    Leukocytes, UA 1+ (*)    Squamous Epithelial / LPF 0-5 (*)  All other components within normal limits   ____________________________________________  EKG   Date: 04/23/2015  Rate: 89  Rhythm: normal sinus rhythm  QRS Axis: normal  Intervals: Right bundle branch block  ST/T Wave abnormalities: normal  Conduction Disutrbances: none  Narrative Interpretation: unremarkable _____________________________________  RADIOLOGY  CXR-IMPRESSION: Mild cardiomegaly. Moderate-sized hiatal hernia. No active disease.  ____________________________________________   PROCEDURES  Procedure(s) performed: none  Critical Care performed: none ____________________________________________   INITIAL IMPRESSION / ASSESSMENT AND PLAN / ED COURSE  Pertinent labs & imaging results that were available during my care of the patient were reviewed by me and considered in my medical decision making (see chart for details).  ----------------------------------------- 5:22 PM on 04/23/2015 -----------------------------------------  Labs reviewed. Patient with hypokalemia that is new since July. Anemia is stable since  July 17. CR improved since initial value 7/17 (was 2); White count has elevated from 12-14 since July. Gently hydrate  ----------------------------------------- 5:58 PM on 04/23/2015 -----------------------------------------  Patient was noted by Colletta Maryland, RN to have the drink straw down her throat and stated she doesn't want to live anymore. I went in to assess patient and she denies trying to hurt herself but states "I just want to go" when asked to clarify she says to heaven.  She is tachypneic and wheezing on my exam. I will order a DuoNeb and a psych consult; patient to be placed on IVC given purposeful attempt to aspirate a drinking straw. ____________________________________________   FINAL CLINICAL IMPRESSION(S) / ED DIAGNOSES  Final diagnoses:  Hypokalemia  mild dehydration, proteinuria Suicidal ideation     Ponciano Ort, MD 04/24/15 0126

## 2015-04-23 NOTE — ED Notes (Signed)
Administered BP medication to pt. Pt tolerated administration well.

## 2015-04-23 NOTE — ED Notes (Signed)
Bladder scan: 120 mL

## 2015-04-23 NOTE — BH Assessment (Signed)
Assessment Note  Mackenzie Rose is an 69 y.o. female, who presents to the ED via EMS; after having called 911 herself for c/o having shortness of breath. While in the ED; client stated, "I just want to go to heaven; I'm done." Client was noticed to have been placing a drinking straw partially down her throat; attempting to choke herself; and stating, "I want to die."  Axis I: Depressive Disorder secondary to general medical condition Axis II: Deferred Axis III:  Past Medical History  Diagnosis Date  . Heart disease   . Atrial fibrillation     a. reported a-fib; b. unknown chronicity; c. dates back to 1970s; d. not on long term anticoagulation 2/2 no documented a-fib  . Hypertension   . High cholesterol   . Diabetes     borderline  . COPD (chronic obstructive pulmonary disease)   . Melanoma   . Kidney disorder   . Tremor   . Autoimmune disease   . Depressed   . Anxiety disorder   . Migraine   . Dementia   . Motion sickness   . Myasthenia gravis   . Bipolar disorder   . Dementia   . Acute renal failure   . Near syncope    Axis IV: problems with access to health care services and problems with primary support group Axis V: 1-10 persistent dangerousness to self and others present  Past Medical History:  Past Medical History  Diagnosis Date  . Heart disease   . Atrial fibrillation     a. reported a-fib; b. unknown chronicity; c. dates back to 1970s; d. not on long term anticoagulation 2/2 no documented a-fib  . Hypertension   . High cholesterol   . Diabetes     borderline  . COPD (chronic obstructive pulmonary disease)   . Melanoma   . Kidney disorder   . Tremor   . Autoimmune disease   . Depressed   . Anxiety disorder   . Migraine   . Dementia   . Motion sickness   . Myasthenia gravis   . Bipolar disorder   . Dementia   . Acute renal failure   . Near syncope     Past Surgical History  Procedure Laterality Date  . Tubal ligation    . Retena repair    .  Cataract extraction    . Cholecystectomy      Family History:  Family History  Problem Relation Age of Onset  . Heart disease Father   . Other Mother     ruptured hernia  . Breast cancer Sister   . Heart disease Sister   . Diabetes Mellitus II Sister     x2    Social History:  reports that she has been smoking Cigarettes.  She started smoking about 50 years ago. She has a 50 pack-year smoking history. She has never used smokeless tobacco. She reports that she does not drink alcohol or use illicit drugs.  Additional Social History:     CIWA: CIWA-Ar BP: (!) 169/82 mmHg Pulse Rate: 90 COWS:    Allergies:  Allergies  Allergen Reactions  . Lactose Other (See Comments)    Reaction:  Unknown   . Lithium Other (See Comments)    Reaction:  Unknown   . Penicillins Rash    Home Medications:  (Not in a hospital admission)  OB/GYN Status:  No LMP recorded. Patient is not currently having periods (Reason: Other).  General Assessment Data Location of Assessment: [facility] ED  TTS Assessment: In system Is this a Tele or Face-to-Face Assessment?: Face-to-Face Is this an Initial Assessment or a Re-assessment for this encounter?: Re-Assessment Marital status: Widowed Is patient pregnant?: No Pregnancy Status: No Living Arrangements: Other (Comment) ([place]) Can pt return to current living arrangement?: Yes Admission Status: Involuntary Is patient capable of signing voluntary admission?: No Referral Source: Self/Family/Friend (client called 911) Insurance type: Medicare  Medical Screening Exam ([place]) Medical Exam completed: No (still being monitored) Reason for MSE not completed: Other: (still being monitored)  Crisis Care Plan Living Arrangements: Other (Comment) ([place]) Name of Psychiatrist: Dr. Gretel Acre  Education Status Is patient currently in school?: No Current Grade:  (n/a) Highest grade of school patient has  completed: 12th Name of school:  (n/a) Contact person: Son; no contact  Risk to self with the past 6 months Suicidal Ideation: Yes-Currently Present Has patient been a risk to self within the past 6 months prior to admission? : No Suicidal Intent: Yes-Currently Present Has patient had any suicidal intent within the past 6 months prior to admission? : No Is patient at risk for suicide?: Yes Suicidal Plan?:  (placed a drinking straw partially down her throat) Has patient had any suicidal plan within the past 6 months prior to admission? : No Access to Means: No What has been your use of drugs/alcohol within the last 12 months?: none Previous Attempts/Gestures: No How many times?: 0 Other Self Harm Risks: attempting to choke herself Triggers for Past Attempts: None known Intentional Self Injurious Behavior: None Family Suicide History: No Recent stressful life event(s): Other (Comment) (medical) Persecutory voices/beliefs?: No Depression: Yes Depression Symptoms: Despondent, Insomnia, Loss of interest in usual pleasures Substance abuse history and/or treatment for substance abuse?: No Suicide prevention information given to non-admitted patients: Yes  Risk to Others within the past 6 months Homicidal Ideation: No Does patient have any lifetime risk of violence toward others beyond the six months prior to admission? : No Thoughts of Harm to Others: No Current Homicidal Intent: No Current Homicidal Plan: No Access to Homicidal Means: No Identified Victim: none History of harm to others?: No Assessment of Violence: None Noted Violent Behavior Description: none noted Does patient have access to weapons?: No Criminal Charges Pending?: No Does patient have a court date: No Is patient on probation?: No  Psychosis Hallucinations: None noted Delusions: None noted  Mental Status Report Appearance/Hygiene: In hospital gown Eye Contact: Poor Motor Activity: Unremarkable Speech:  Pressured Level of Consciousness: Alert, Crying Mood: Depressed, Sad Affect: Sad, Depressed Anxiety Level: Moderate Thought Processes: Circumstantial Judgement: Impaired Orientation: Person, Place, Situation Obsessive Compulsive Thoughts/Behaviors: Severe  Cognitive Functioning Concentration: Poor Memory: Recent Impaired, Remote Impaired Insight: Poor Impulse Control: Poor Appetite: Fair Weight Loss: 0 Weight Gain: 0 Sleep: Decreased Total Hours of Sleep: 3  ADLScreening [facility] Assessment Services) Patient's cognitive ability adequate to safely complete daily activities?: No Patient able to express need for assistance with ADLs?: Yes Independently performs ADLs?: No  Prior Inpatient Therapy Prior Inpatient Therapy: No  Prior Outpatient Therapy Prior Outpatient Therapy: Yes Prior Therapy Dates: quarterly Prior Therapy Facilty/Provider(s): Dr. Anola Gurney office Reason for Treatment: Depression Does patient have an ACCT team?: No Does patient have Intensive In-House Services?  : No Does patient have [facility] services? : No Does patient have P4CC services?: No  ADL Screening (condition at time of admission) Patient's cognitive ability adequate to safely complete daily activities?: No Patient able to express need for assistance with  ADLs?: Yes Independently performs ADLs?: No       Abuse/Neglect Assessment (Assessment to be complete while patient is alone) Physical Abuse: Denies Verbal Abuse: Denies Sexual Abuse: Denies Exploitation of patient/patient's resources: Denies Self-Neglect: Denies Values / Beliefs Cultural Requests During Hospitalization: None Spiritual Requests During Hospitalization: None Consults Spiritual Care Consult Needed: No Social Work Consult Needed: No Regulatory affairs officer (For Healthcare) Does patient have an advance directive?: No Would patient like information on creating an advanced directive?: No - patient declined information     Additional Information 1:1 In Past 12 Months?: No CIRT Risk: No Elopement Risk: No Does patient have medical clearance?: No  Child/Adolescent Assessment Running Away Risk: Denies Bed-Wetting: Denies Destruction of Property: Denies Cruelty to Animals: Denies Stealing: Denies Rebellious/Defies Authority: Denies Satanic Involvement: Denies Science writer: Denies Problems at [employer]: Denies Gang Involvement: Denies  Disposition:  Disposition Initial Assessment Completed for this Encounter: Yes Disposition of Patient: Inpatient treatment program Type of inpatient treatment program: Adult (Geriatric Psychiatry)  On Site Evaluation by:   Reviewed with Physician:    Maris Berger 04/23/2015 8:12 PM

## 2015-04-23 NOTE — ED Notes (Signed)

## 2015-04-23 NOTE — ED Notes (Signed)
Per ems , [facility] and called 911 herself for shortness of breath. Pt with hx of dementia and bipolar. 100% on room air for ems and here. Pt unsure why she is here.

## 2015-04-24 DIAGNOSIS — E876 Hypokalemia: Secondary | ICD-10-CM | POA: Diagnosis not present

## 2015-04-24 LAB — GLUCOSE, CAPILLARY: GLUCOSE-CAPILLARY: 83 mg/dL (ref 65–99)

## 2015-04-24 MED ORDER — HYDROXYZINE HCL 25 MG PO TABS
50.0000 mg | ORAL_TABLET | Freq: Once | ORAL | Status: AC
  Administered 2015-04-24: 50 mg via ORAL
  Filled 2015-04-24: qty 2

## 2015-04-24 NOTE — BHH Counselor (Signed)
Discussed pt. with ER MD (Dr. Marcelene Butte) and , pt. is able to d/c back to [facility] when medically cleared (per Dr.Challa).

## 2015-04-24 NOTE — ED Notes (Signed)
BEHAVIORAL HEALTH ROUNDING Patient sleeping: No. Patient alert and oriented: yes Behavior appropriate: Yes.  ; If no, describe:  Nutrition and fluids offered: Yes  Toileting and hygiene offered: Yes  Sitter present: yes Law enforcement present: Yes  

## 2015-04-24 NOTE — BHH Counselor (Signed)
Geri-psych referral sent to [place], [facility] and [facility] [place]

## 2015-04-24 NOTE — ED Notes (Signed)
BEHAVIORAL HEALTH ROUNDING Patient sleeping: Yes.   Patient alert and oriented: not applicable Behavior appropriate: Yes.  ; If no, describe:  Nutrition and fluids offered: Yes  Toileting and hygiene offered: Yes  Sitter present: yes Law enforcement present: Yes   Pt awaiting EMS transport to facility.

## 2015-04-24 NOTE — ED Provider Notes (Signed)
-----------------------------------------   6:48 AM on 04/24/2015 -----------------------------------------   Blood pressure 186/105, pulse 96, temperature 98.3 F (36.8 C), temperature source Oral, resp. rate 30, height 4\' 11"  (1.499 m), weight 132 lb (59.875 kg), SpO2 98 %.  The patient had no acute events since last update.  Calm and cooperative at this time.  Disposition is pending per Psychiatry/Behavioral Medicine team recommendations.     Paulette Blanch, MD 04/24/15 [phone]

## 2015-04-24 NOTE — ED Provider Notes (Signed)
The patient was seen by psychiatry and was felt to be clear to be discharged . They felt we could renege the IVC commitment that she was not at risk to harm herself. I saw the patient myself and she expresses no suicidal thoughts, homicidal thoughts, hallucinations. Patient's remained cooperative.  Daymon Larsen, MD 04/24/15 [id number]

## 2015-04-24 NOTE — ED Notes (Signed)
Breakfast:  Pt ate bologna sandwich, 1 orange, 1 cup apple juice, 1 carton of lactaid and 1 cup of prune juice.

## 2015-04-24 NOTE — ED Notes (Signed)
Pt resting in bed in no distress.

## 2015-04-24 NOTE — ED Notes (Addendum)
Pt resting in bed, no distress noted.   BEHAVIORAL HEALTH ROUNDING Patient sleeping: No. Patient alert and oriented: yes Behavior appropriate: Yes.  ; If no, describe:  Nutrition and fluids offered: Yes  Toileting and hygiene offered: Yes  Sitter present: yes Law enforcement present: Yes

## 2015-04-24 NOTE — ED Notes (Addendum)
BEHAVIORAL HEALTH ROUNDING Patient sleeping: Yes.   Patient alert and oriented: not applicable Behavior appropriate: Yes.  ; If no, describe:  Nutrition and fluids offered: Yes  Toileting and hygiene offered: Yes  Sitter present: yes Law enforcement present: Yes  ENVIRONMENTAL ASSESSMENT Potentially harmful objects out of patient reach: Yes.   Personal belongings secured: Yes.   Patient dressed in hospital provided attire only: Yes.   Plastic bags out of patient reach: Yes.   Patient care equipment (cords, cables, call bells, lines, and drains) shortened, removed, or accounted for: Yes.   Equipment and supplies removed from bottom of stretcher: Yes.   Potentially toxic materials out of patient reach: Yes.   Sharps container removed or out of patient reach: Yes.

## 2015-04-24 NOTE — ED Notes (Signed)
Dr Franchot Mimes recommended d/c patient back to [place], MD informed of incident yesterday where pt was trying to choke her self with straw, Dr Franchot Mimes still with plans to d/c patient. ED MD Marcelene Butte informed of situation with patient, spoke with patient and pt denies any SI or HI at this time. Pt alert and oriented x4.

## 2015-04-24 NOTE — ED Notes (Signed)
BEHAVIORAL HEALTH ROUNDING Patient sleeping: Yes.   Patient alert and oriented: not applicable Behavior appropriate: Yes.  ; If no, describe:  Nutrition and fluids offered: Yes  Toileting and hygiene offered: Yes  Sitter present: yes Law enforcement present: Yes  

## 2015-04-24 NOTE — ED Notes (Signed)
BEHAVIORAL HEALTH ROUNDING  Patient sleeping: Yes  Patient alert and oriented: Sleeping  Behavior appropriate: Yes. ; If no, describe:  Nutrition and fluids offered: Sleeping  Toileting and hygiene offered: Sleeping  Sitter present: no  Law enforcement present: Yes

## 2015-04-24 NOTE — ED Notes (Signed)
Provided pt with warm blanket, per pt request.

## 2015-04-24 NOTE — ED Notes (Signed)
BEHAVIORAL HEALTH ROUNDING Patient sleeping: No. Patient alert and oriented: yes Behavior appropriate: Yes.  ; If no, describe:  Nutrition and fluids offered: Yes  Toileting and hygiene offered: Yes  Sitter present: yes Law enforcement present: Yes   Pt ambulated to toilet in room.

## 2015-04-24 NOTE — ED Notes (Signed)
BEHAVIORAL HEALTH ROUNDING Patient sleeping: No. Patient alert and oriented: no Behavior appropriate: Yes.   Nutrition and fluids offered: Yes  Toileting and hygiene offered: Yes  Sitter present: yes Law enforcement present: Yes   ENVIRONMENTAL ASSESSMENT Potentially harmful objects out of patient reach: Yes.   Personal belongings secured: Yes.   Patient dressed in hospital provided attire only: Yes.   Plastic bags out of patient reach: Yes.   Patient care equipment (cords, cables, call bells, lines, and drains) shortened, removed, or accounted for: Yes.   Equipment and supplies removed from bottom of stretcher: Yes.   Potentially toxic materials out of patient reach: Yes.   Sharps container removed or out of patient reach: Yes.

## 2015-04-24 NOTE — ED Notes (Signed)
Provided pt with cup of ice and warm blanket.

## 2015-04-24 NOTE — ED Notes (Signed)
BEHAVIORAL HEALTH ROUNDING Patient sleeping: No. Patient alert and oriented: yes Behavior appropriate: Yes.  ; If no, describe:  Nutrition and fluids offered: Yes  Toileting and hygiene offered: Yes  Sitter present: no Law enforcement present: Yes  

## 2015-04-24 NOTE — Discharge Instructions (Signed)
Dehydration Dehydration is when you lose more fluids from the body than you take in. Vital organs such as the kidneys, brain, and heart cannot function without a proper amount of fluids and salt. Any loss of fluids from the body can cause dehydration.  Older adults are at a higher risk of dehydration than younger adults. As we age, our bodies are less able to conserve water and do not respond to temperature changes as well. Also, older adults do not become thirsty as easily or quickly. Because of this, older adults often do not realize they need to increase fluids to avoid dehydration.  CAUSES   Vomiting.  Diarrhea.  Excessive sweating.  Excessive urination.  Fever.  Certain medicines, such as blood pressure medicines called diuretics.  Poorly controlled blood sugars. SIGNS AND SYMPTOMS  Mild dehydration:  Thirst.  Dry lips.  Slightly dry mouth. Moderate dehydration:  Very dry mouth.  Sunken eyes.  Skin does not bounce back quickly when lightly pinched and released.  Dark urine and decreased urine production.  Decreased tear production.  Headache. Severe dehydration:  Very dry mouth.  Extreme thirst.  Rapid, weak pulse (more than 100 beats per minute at rest).  Cold hands and feet.  Not able to sweat in spite of heat.  Rapid breathing.  Blue lips.  Confusion and lethargy.  Difficulty being awakened.  Minimal urine production.  No tears. DIAGNOSIS  Your health care provider will diagnose dehydration based on your symptoms and your exam. Blood and urine tests will help confirm the diagnosis. The diagnostic evaluation should also identify the cause of dehydration. TREATMENT  Treatment of mild or moderate dehydration can often be done at home by increasing the amount of fluids that you drink. It is best to drink small amounts of fluid more often. Drinking too much at one time can make vomiting worse. Severe dehydration needs to be treated at the hospital.  You may be given IV fluids that contain water and electrolytes. HOME CARE INSTRUCTIONS   Ask your health care provider about specific rehydration instructions.  Drink enough fluids to keep your urine clear or pale yellow.  Drink small amounts frequently if you have nausea and vomiting.  Eat as you normally do.  Avoid:  Foods or drinks high in sugar.  Carbonated drinks.  Juice.  Extremely hot or cold fluids.  Drinks with caffeine.  Fatty, greasy foods.  Alcohol.  Tobacco.  Overeating.  Gelatin desserts.  Wash your hands well to avoid spreading bacteria and viruses.  Only take over-the-counter or prescription medicines for pain, discomfort, or fever as directed by your health care provider.  Ask your health care provider if you should continue all prescribed and over-the-counter medicines.  Keep all follow-up appointments with your health care provider. SEEK MEDICAL CARE IF:  You have abdominal pain, and it increases or stays in one area (localizes).  You have a rash, stiff neck, or severe headache.  You are irritable, sleepy, or difficult to awaken.  You are weak, dizzy, or extremely thirsty.  You have a fever. SEEK IMMEDIATE MEDICAL CARE IF:   You are unable to keep fluids down, or you get worse despite treatment.  You have frequent episodes of vomiting or diarrhea.  You have blood or green matter (bile) in your vomit.  You have blood in your stool, or your stool looks black and tarry.  You have not urinated in 6-8 hours, or you have only urinated a small amount of very dark urine.    You faint. MAKE SURE YOU:   Understand these instructions.  Will watch your condition.  Will get help right away if you are not doing well or get worse. Document Released: 11/15/2003 Document Revised: 08/30/2013 Document Reviewed: 05/02/2013 ExitCare Patient Information 2015 [employer]. This information is not intended to replace advice given to you by your  health care provider. Make sure you discuss any questions you have with your health care provider.  

## 2015-04-24 NOTE — ED Notes (Signed)
BEHAVIORAL HEALTH ROUNDING Patient sleeping: Yes.   Patient alert and oriented: not applicable Behavior appropriate: Yes.  ; If no, describe:  Nutrition and fluids offered: Yes  Toileting and hygiene offered: Yes  Sitter present: yes Law enforcement present: Yes  ENVIRONMENTAL ASSESSMENT Potentially harmful objects out of patient reach: Yes.   Personal belongings secured: Yes.   Patient dressed in hospital provided attire only: Yes.   Plastic bags out of patient reach: Yes.   Patient care equipment (cords, cables, call bells, lines, and drains) shortened, removed, or accounted for: Yes.   Equipment and supplies removed from bottom of stretcher: Yes.   Potentially toxic materials out of patient reach: Yes.   Sharps container removed or out of patient reach: Yes.

## 2015-04-24 NOTE — ED Notes (Signed)
Spoke with Geroge Baseman at [facility], unable to transport, EMS to take back to facility. Discharge instructions given to caregiver by this RN.

## 2015-04-24 NOTE — ED Notes (Signed)
BEHAVIORAL HEALTH ROUNDING Patient sleeping: No. Patient alert and oriented: no Behavior appropriate: Yes.   Nutrition and fluids offered: Yes  Toileting and hygiene offered: Yes  Sitter present: yes Law enforcement present: Yes

## 2015-04-25 ENCOUNTER — Other Ambulatory Visit: Payer: Self-pay

## 2015-04-25 ENCOUNTER — Emergency Department
Admission: EM | Admit: 2015-04-25 | Discharge: 2015-04-26 | Payer: Medicare Other | Attending: Emergency Medicine | Admitting: Emergency Medicine

## 2015-04-25 ENCOUNTER — Emergency Department: Payer: Medicare Other

## 2015-04-25 DIAGNOSIS — I10 Essential (primary) hypertension: Secondary | ICD-10-CM | POA: Diagnosis not present

## 2015-04-25 DIAGNOSIS — N390 Urinary tract infection, site not specified: Secondary | ICD-10-CM | POA: Diagnosis not present

## 2015-04-25 DIAGNOSIS — Z880 Allergy status to penicillin: Secondary | ICD-10-CM | POA: Insufficient documentation

## 2015-04-25 DIAGNOSIS — R55 Syncope and collapse: Secondary | ICD-10-CM

## 2015-04-25 DIAGNOSIS — R531 Weakness: Secondary | ICD-10-CM | POA: Diagnosis not present

## 2015-04-25 DIAGNOSIS — Z720 Tobacco use: Secondary | ICD-10-CM | POA: Diagnosis not present

## 2015-04-25 DIAGNOSIS — J441 Chronic obstructive pulmonary disease with (acute) exacerbation: Secondary | ICD-10-CM | POA: Diagnosis not present

## 2015-04-25 DIAGNOSIS — Z791 Long term (current) use of non-steroidal anti-inflammatories (NSAID): Secondary | ICD-10-CM | POA: Diagnosis not present

## 2015-04-25 DIAGNOSIS — Z79899 Other long term (current) drug therapy: Secondary | ICD-10-CM | POA: Insufficient documentation

## 2015-04-25 DIAGNOSIS — R0602 Shortness of breath: Secondary | ICD-10-CM | POA: Diagnosis present

## 2015-04-25 DIAGNOSIS — Z7952 Long term (current) use of systemic steroids: Secondary | ICD-10-CM | POA: Diagnosis not present

## 2015-04-25 LAB — CBC WITH DIFFERENTIAL/PLATELET
Basophils Absolute: 0 10*3/uL (ref 0–0.1)
Basophils Relative: 0 %
EOS ABS: 0 10*3/uL (ref 0–0.7)
HCT: 29.3 % — ABNORMAL LOW (ref 35.0–47.0)
Hemoglobin: 8.6 g/dL — ABNORMAL LOW (ref 12.0–16.0)
LYMPHS ABS: 0.8 10*3/uL — AB (ref 1.0–3.6)
MCH: 21 pg — AB (ref 26.0–34.0)
MCHC: 29.4 g/dL — AB (ref 32.0–36.0)
MCV: 71.2 fL — AB (ref 80.0–100.0)
MONO ABS: 0.6 10*3/uL (ref 0.2–0.9)
Monocytes Relative: 5 %
Neutro Abs: 10.7 10*3/uL — ABNORMAL HIGH (ref 1.4–6.5)
Neutrophils Relative %: 88 %
PLATELETS: 212 10*3/uL (ref 150–440)
RBC: 4.12 MIL/uL (ref 3.80–5.20)
RDW: 19.4 % — AB (ref 11.5–14.5)
WBC: 12.2 10*3/uL — ABNORMAL HIGH (ref 3.6–11.0)

## 2015-04-25 LAB — COMPREHENSIVE METABOLIC PANEL
ALK PHOS: 88 U/L (ref 38–126)
ALT: 21 U/L (ref 14–54)
ANION GAP: 11 (ref 5–15)
AST: 31 U/L (ref 15–41)
Albumin: 3.1 g/dL — ABNORMAL LOW (ref 3.5–5.0)
BUN: 29 mg/dL — ABNORMAL HIGH (ref 6–20)
CALCIUM: 8.6 mg/dL — AB (ref 8.9–10.3)
CO2: 24 mmol/L (ref 22–32)
Chloride: 102 mmol/L (ref 101–111)
Creatinine, Ser: 2.04 mg/dL — ABNORMAL HIGH (ref 0.44–1.00)
GFR, EST AFRICAN AMERICAN: 27 mL/min — AB (ref >60)
GFR, EST NON AFRICAN AMERICAN: 23 mL/min — AB (ref >60)
GLUCOSE: 182 mg/dL — AB (ref 65–99)
Potassium: 3.2 mmol/L — ABNORMAL LOW (ref 3.5–5.1)
Sodium: 137 mmol/L (ref 135–145)
TOTAL PROTEIN: 5.7 g/dL — AB (ref 6.5–8.1)

## 2015-04-25 LAB — URINALYSIS COMPLETE WITH MICROSCOPIC (ARMC ONLY)
BILIRUBIN URINE: NEGATIVE
GLUCOSE, UA: NEGATIVE mg/dL
HGB URINE DIPSTICK: NEGATIVE
KETONES UR: NEGATIVE mg/dL
NITRITE: NEGATIVE
Protein, ur: >500 mg/dL — AB
Specific Gravity, Urine: 1.013 (ref 1.005–1.030)
pH: 6 (ref 5.0–8.0)

## 2015-04-25 LAB — LACTIC ACID, PLASMA: Lactic Acid, Venous: 1.4 mmol/L (ref 0.5–2.0)

## 2015-04-25 LAB — TROPONIN I: TROPONIN I: 0.08 ng/mL — AB (ref <0.031)

## 2015-04-25 MED ORDER — SODIUM CHLORIDE 0.9 % IV BOLUS (SEPSIS)
1000.0000 mL | Freq: Once | INTRAVENOUS | Status: AC
  Administered 2015-04-25: 1000 mL via INTRAVENOUS

## 2015-04-25 NOTE — ED Notes (Signed)
Dr Reita Cliche at bedside, explaining plan of care to patient.

## 2015-04-25 NOTE — ED Provider Notes (Signed)
[facility] Emergency Department Provider Note   ____________________________________________  Time seen: On arrival by EMS I have reviewed the triage vital signs and the triage nursing note.  HISTORY  Chief Complaint Shortness of Breath   Historian Patient, and EMS report from the [facility]  HPI Mackenzie Rose is a 69 y.o. female who has a history of heart disease, A. fib, diabetes, depression and anxiety, who lives at a group home and was recently laid discharge from the emergency department after a psychiatric overnight evaluation, is coming in with a complaint of generalized weakness and syncope. EMS found her to be orthostatic with a standing blood pressure near 70 when she did have additional syncope. No reported trauma or traumatic injury. No reported fevers. She's complaining of a little bit of shortness of breath which appears to be chronic. No chest pain. Mild right-sided abdominal discomfort called crampy. Denies specific urinary symptoms.Denies appetite changes. States that she is on tramadol for chronic pain.    Past Medical History  Diagnosis Date  . Heart disease   . Atrial fibrillation     a. reported a-fib; b. unknown chronicity; c. dates back to 1970s; d. not on long term anticoagulation 2/2 no documented a-fib  . Hypertension   . High cholesterol   . Diabetes     borderline  . COPD (chronic obstructive pulmonary disease)   . Melanoma   . Kidney disorder   . Tremor   . Autoimmune disease   . Depressed   . Anxiety disorder   . Migraine   . Dementia   . Motion sickness   . Myasthenia gravis   . Bipolar disorder   . Dementia   . Acute renal failure   . Near syncope     Patient Active Problem List   Diagnosis Date Noted  . Near syncope 03/25/2015  . ARF (acute renal failure) 03/25/2015  . COPD exacerbation 03/25/2015  . Anxiety 12/12/2014  . Bipolar affective disorder, mixed 12/12/2014  . Renal insufficiency  syndrome 12/12/2014  . H/O: HTN (hypertension) 12/12/2014  . H/O diabetes mellitus 12/12/2014  . CAFL (chronic airflow limitation) 12/12/2014  . Bipolar disorder   . Dementia   . Ulnar neuropathy at elbow of right upper extremity 09/22/2014  . Myasthenia gravis 11/22/2013    Past Surgical History  Procedure Laterality Date  . Tubal ligation    . Retena repair    . Cataract extraction    . Cholecystectomy      Current Outpatient Rx  Name  Route  Sig  Dispense  Refill  . acetaminophen (TYLENOL) 650 MG CR tablet   Oral   Take 650 mg by mouth daily. Pt takes daily at 2pm and every four hours as needed for pain.         Marland Kitchen albuterol (PROVENTIL HFA;VENTOLIN HFA) 108 (90 BASE) MCG/ACT inhaler   Inhalation   Inhale 2 puffs into the lungs every 4 (four) hours as needed for wheezing or shortness of breath.   6.7 g   1   . bismuth subsalicylate (PEPTO BISMOL) 262 MG/15ML suspension   Oral   Take 5 mLs by mouth 4 (four) times daily as needed for diarrhea or loose stools (or nausea).         . busPIRone (BUSPAR) 5 MG tablet   Oral   Take 2 tablets (10 mg total) by mouth 2 (two) times daily.   30 tablet   3   . chlorpheniramine-HYDROcodone (TUSSIONEX)  10-8 MG/5ML SUER   Oral   Take 5 mLs by mouth every 12 (twelve) hours as needed for cough.   115 mL   0   . Dextromethorphan-Benzocaine (SORE THROAT & COUGH LOZENGES MT)   Mouth/Throat   Use as directed 1 lozenge in the mouth or throat 4 (four) times daily as needed (for cough).         . diclofenac sodium (VOLTAREN) 1 % GEL   Topical   Apply 2 g topically 2 (two) times daily.          Marland Kitchen diltiazem (CARDIZEM CD) 180 MG 24 hr capsule   Oral   Take 180 mg by mouth daily.          Marland Kitchen donepezil (ARICEPT) 10 MG tablet   Oral   Take 1 tablet (10 mg total) by mouth daily.   30 tablet   3   . Fluticasone-Salmeterol (ADVAIR) 250-50 MCG/DOSE AEPB   Inhalation   Inhale 1 puff into the lungs 2 (two) times daily.          Marland Kitchen glimepiride (AMARYL) 1 MG tablet   Oral   Take 1 mg by mouth 2 (two) times daily with a meal.         . guaifenesin (ROBITUSSIN) 100 MG/5ML syrup   Oral   Take 100 mg by mouth 2 (two) times daily as needed for cough.         . hydrALAZINE (APRESOLINE) 10 MG tablet   Oral   Take 10 mg by mouth 3 (three) times daily.         Marland Kitchen lamoTRIgine (LAMICTAL) 100 MG tablet   Oral   Take 1 tablet (100 mg total) by mouth 2 (two) times daily.   60 tablet   3   . latanoprost (XALATAN) 0.005 % ophthalmic solution   Both Eyes   Place 1 drop into both eyes at bedtime.          . nicotine (NICODERM CQ - DOSED IN MG/24 HR) 7 mg/24hr patch   Transdermal   Place 1 patch (7 mg total) onto the skin daily.   15 patch   0   . phenylephrine-shark liver oil-mineral oil-petrolatum (PREPARATION H) 0.25-3-14-71.9 % rectal ointment   Rectal   Place 1 application rectally 2 (two) times daily as needed for hemorrhoids.         . polyethylene glycol (MIRALAX / GLYCOLAX) packet   Oral   Take 17 g by mouth daily as needed for mild constipation.         . predniSONE (DELTASONE) 20 MG tablet   Oral   Take 30 mg by mouth every other day.         . pyridostigmine (MESTINON) 60 MG tablet   Oral   Take 60 mg by mouth 3 (three) times daily.          . QUEtiapine (SEROQUEL) 200 MG tablet   Oral   Take 1 tablet (200 mg total) by mouth at bedtime.   30 tablet   3   . sodium chloride (OCEAN) 0.65 % SOLN nasal spray   Each Nare   Place 2 sprays into both nostrils daily as needed for congestion.          . traMADol (ULTRAM) 50 MG tablet   Oral   Take 50 mg by mouth 2 (two) times daily.          . traZODone (DESYREL) 50 MG tablet  Oral   Take 1 tablet (50 mg total) by mouth at bedtime. Take 1/2 at bedtime. Patient taking differently: Take 50 mg by mouth at bedtime.    30 tablet   2   . doxycycline (VIBRAMYCIN) 50 MG capsule   Oral   Take 1 capsule (50 mg total) by mouth 2  (two) times daily. Patient not taking: Reported on 04/23/2015   10 capsule   0   . predniSONE (DELTASONE) 10 MG tablet   Oral   Take 3 tablets (30 mg total) by mouth every other day. Patient not taking: Reported on 03/29/2015   90 tablet   3   . predniSONE (DELTASONE) 20 MG tablet   Oral   Take 3 tablets (60 mg total) by mouth daily with breakfast. Patient not taking: Reported on 03/29/2015   15 tablet   0     Allergies Lactose intolerance (gi); Lithium; and Penicillins  Family History  Problem Relation Age of Onset  . Heart disease Father   . Other Mother     ruptured hernia  . Breast cancer Sister   . Heart disease Sister   . Diabetes Mellitus II Sister     x2    Social History Social History  Substance Use Topics  . Smoking status: Current Some Day Smoker -- 2.00 packs/day for 50 years    Types: Cigarettes    Start date: 03/28/1965  . Smokeless tobacco: Never Used  . Alcohol Use: No     Comment: Occasionally    Review of Systems  Constitutional: Negative for fever. Eyes: Negative for visual changes. ENT: Negative for sore throat. Cardiovascular: Negative for chest pain. Respiratory: Mild shortness of breath, nonpleuritic. Gastrointestinal: Negative for vomiting and diarrhea. Genitourinary: Negative for dysuria. Musculoskeletal: Negative for back pain. Skin: Negative for rash. Neurological: Negative for headaches, focal weakness or numbness. 10 point Review of Systems otherwise negative ____________________________________________   PHYSICAL EXAM:  VITAL SIGNS: ED Triage Vitals  Enc Vitals Group     BP --      Pulse --      Resp --      Temp 04/25/15 2042 97.6 F (36.4 C)     Temp Source 04/25/15 2042 Oral     SpO2 --      Weight 04/25/15 2042 129 lb 6.4 oz (58.695 kg)     Height 04/25/15 2042 5\' 4"  (1.626 m)     Head Cir --      Peak Flow --      Pain Score --      Pain Loc --      Pain Edu? --      Excl. in [place]? --       Constitutional: Alert and oriented. He is eyes closed unless asked to open them. Well appearing and in no distress. Eyes: Conjunctivae are normal. Pupils pinpoint bilaterally. Normal extraocular movements. ENT   Head: Normocephalic and atraumatic.   Nose: No congestion/rhinnorhea.   Mouth/Throat: Mucous membranes are mildly dry.   Neck: No stridor. Cardiovascular/Chest: Normal rate, regular rhythm.  No murmurs, rubs, or gallops. Respiratory: Normal respiratory effort without tachypnea nor retractions. Breath sounds are clear and equal bilaterally. No wheezes/rales/rhonchi. Gastrointestinal: Soft. No distention, no guarding, no rebound. Mild tenderness periumbilical and right-sided. Genitourinary/rectal:Deferred Musculoskeletal: Nontender with normal range of motion in all extremities. No joint effusions.  No lower extremity tenderness nor edema. Neurologic:  Normal speech and language. No gross or focal neurologic deficits are appreciated. Skin:  Skin  is warm, dry and intact. No rash noted. Psychiatric: Mood and affect are normal. Speech and behavior are normal.  ____________________________________________   EKG I, Lisa Roca, MD, the attending physician have personally viewed and interpreted all ECGs.  80 bpm. Normal sinus rhythm. right bundle-branch block.. Normal axis. T-wave inversion anteriorly. ____________________________________________  LABS (pertinent positives/negatives)  White blood count 12.2, hemoglobin 8.6 and troponin 0.08 Lactic acid 1.4 Metabolic panel significant for potassium 3.2 and BUN 29 and creatinine 2.04 Urinalysis pending  ____________________________________________  RADIOLOGY All Xrays were viewed by me. Imaging interpreted by Radiologist.  Chest x-ray portable negative __________________________________________  PROCEDURES  Procedure(s) performed: None Critical Care performed:  None  ____________________________________________   ED COURSE / ASSESSMENT AND PLAN  CONSULTATIONS: None  Pertinent labs & imaging results that were available during my care of the patient were reviewed by me and considered in my medical decision making (see chart for details).   Patient arrived EMS having been lying on the floor stating that she was "too weak." She is also reportedly complaining of a little bit of shortness of breath but when I asked about this she states it's more chronic. EMS reported that her systolic blood pressure at one point was 70 and she experienced a syncopal episode upon trying to stand up. Here she has no hypotension or tachycardia on initial vital signs. She has a unusual affect in that she keeps her eyes closed until you ask her to open them, and does state that she is just weak all over. She has no focal neurologic deficit. Her labs and evaluation are similar to prior with mild chronic renal failure, mild chronic troponin elevation in the past, and anemia.  My plan is to check a urinalysis, orthostatic vital signs, and repeat her troponin 3 hours at midnight. I would suspect that without other abnormalities found, that she would likely be discharged home.  In terms of the weakness, it's mild generalized and she is able to do every task that we've asked her to do. There is no focal neurologic deficit and do not suspect an intracranial emergency.  Her white blood cell count is minimally elevated, and has been in the past. She doesn't have a fever, nor does she have any obvious source of infection.  Orthostatics showed not an orthostatic hypotensive pattern, however when she sat up she did get a little hypotensive. I will give her a second liter of fluids while I am awaiting to 3 hour troponin.  Urinalysis pending. Repeat troponin at midnight is pending. Patient care transferred to Dr. Owens Shark at shift change 11 PM.  Patient / Family / Caregiver informed of  clinical course, medical decision-making process, and agree with plan.    ___________________________________________   FINAL CLINICAL IMPRESSION(S) / ED DIAGNOSES   Final diagnoses:  Syncope, unspecified syncope type  Generalized weakness       Lisa Roca, MD 04/25/15 2318

## 2015-04-25 NOTE — ED Notes (Addendum)
Pt assisted by RN and NT w/sitting and standing for orthostatic VS.  Then sat on bedpan and voided 28ml clear, yellow urine.  Pt requiring repeated encouragement to keep her eyes open.  Pt states, "Why?"  I explained to her it is important for her to participate in her care.  Currently pt not willing to even lift her arms to put her gown on without specific instruction to do so.  Pt assisted back to bed and instructed to reposition on her side as her bilateral buttocks are reddened and pt c/o discomfort there.  Wet Attends removed and skin cleansed and left open to air.

## 2015-04-25 NOTE — ED Notes (Signed)
From [facility].  Staff called EMS for difficulty breathing.  Upon arrival, EMS reports constricted pupils, pt lying on floor reporting weakness.  When EMS got her up to the stretcher, she passed out and her SBP=70. CBG=171.  Pt responsive upon arrival to room, oriented x 2-3

## 2015-04-26 LAB — TROPONIN I: Troponin I: 0.07 ng/mL — ABNORMAL HIGH (ref <0.031)

## 2015-04-26 MED ORDER — CEPHALEXIN 500 MG PO TABS: 500.0000 mg | ORAL_TABLET | Freq: Two times a day (BID) | ORAL | Status: AC

## 2015-04-26 NOTE — ED Notes (Signed)
Critical lab result Troponin=0.08 reported to Dr Reita Cliche.

## 2015-04-26 NOTE — ED Notes (Signed)
Phone call placed to [facility] to [facility], updated on pt transport via EMS and of prescription to be filled in pt's belonging's bag.

## 2015-04-26 NOTE — Discharge Instructions (Signed)
Syncope °Syncope is a medical term for fainting or passing out. This means you lose consciousness and drop to the ground. People are generally unconscious for less than 5 minutes. You may have some muscle twitches for up to 15 seconds before waking up and returning to normal. Syncope occurs more often in older adults, but it can happen to anyone. While most causes of syncope are not dangerous, syncope can be a sign of a serious medical problem. It is important to seek medical care.  °CAUSES  °Syncope is caused by a sudden drop in blood flow to the brain. The specific cause is often not determined. Factors that can bring on syncope include: °· Taking medicines that lower blood pressure. °· Sudden changes in posture, such as standing up quickly. °· Taking more medicine than prescribed. °· Standing in one place for too long. °· Seizure disorders. °· Dehydration and excessive exposure to heat. °· Low blood sugar (hypoglycemia). °· Straining to have a bowel movement. °· Heart disease, irregular heartbeat, or other circulatory problems. °· Fear, emotional distress, seeing blood, or severe pain. °SYMPTOMS  °Right before fainting, you may: °· Feel dizzy or light-headed. °· Feel nauseous. °· See all white or all black in your field of vision. °· Have cold, clammy skin. °DIAGNOSIS  °Your health care provider will ask about your symptoms, perform a physical exam, and perform an electrocardiogram (ECG) to record the electrical activity of your heart. Your health care provider may also perform other heart or blood tests to determine the cause of your syncope which may include: °· Transthoracic echocardiogram (TTE). During echocardiography, sound waves are used to evaluate how blood flows through your heart. °· Transesophageal echocardiogram (TEE). °· Cardiac monitoring. This allows your health care provider to monitor your heart rate and rhythm in real time. °· Holter monitor. This is a portable device that records your  heartbeat and can help diagnose heart arrhythmias. It allows your health care provider to track your heart activity for several days, if needed. °· Stress tests by exercise or by giving medicine that makes the heart beat faster. °TREATMENT  °In most cases, no treatment is needed. Depending on the cause of your syncope, your health care provider may recommend changing or stopping some of your medicines. °HOME CARE INSTRUCTIONS °· Have someone stay with you until you feel stable. °· Do not drive, use machinery, or play sports until your health care provider says it is okay. °· Keep all follow-up appointments as directed by your health care provider. °· Lie down right away if you start feeling like you might faint. Breathe deeply and steadily. Wait until all the symptoms have passed. °· Drink enough fluids to keep your urine clear or pale yellow. °· If you are taking blood pressure or heart medicine, get up slowly and take several minutes to sit and then stand. This can reduce dizziness. °SEEK IMMEDIATE MEDICAL CARE IF:  °· You have a severe headache. °· You have unusual pain in the chest, abdomen, or back. °· You are bleeding from your mouth or rectum, or you have black or tarry stool. °· You have an irregular or very fast heartbeat. °· You have pain with breathing. °· You have repeated fainting or seizure-like jerking during an episode. °· You faint when sitting or lying down. °· You have confusion. °· You have trouble walking. °· You have severe weakness. °· You have vision problems. °If you fainted, call your local emergency services (911 in [place] Do not drive   yourself to the hospital.  MAKE SURE YOU:  Understand these instructions.  Will watch your condition.  Will get help right away if you are not doing well or get worse. Document Released: 08/25/2005 Document Revised: 08/30/2013 Document Reviewed: 10/24/2011 [facility] Patient Information 2015 [place], [place]. This information is not intended to replace  advice given to you by your health care provider. Make sure you discuss any questions you have with your health care provider.  Weakness Weakness is a lack of strength. It may be felt all over the body (generalized) or in one specific part of the body (focal). Some causes of weakness can be serious. You may need further medical evaluation, especially if you are elderly or you have a history of immunosuppression (such as chemotherapy or HIV), kidney disease, heart disease, or diabetes. CAUSES  Weakness can be caused by many different things, including:  Infection.  Physical exhaustion.  Internal bleeding or other blood loss that results in a lack of red blood cells (anemia).  Dehydration. This cause is more common in elderly people.  Side effects or electrolyte abnormalities from medicines, such as pain medicines or sedatives.  Emotional distress, anxiety, or depression.  Circulation problems, especially severe peripheral arterial disease.  Heart disease, such as rapid atrial fibrillation, bradycardia, or heart failure.  Nervous system disorders, such as Guillain-Barr syndrome, multiple sclerosis, or stroke. DIAGNOSIS  To find the cause of your weakness, your caregiver will take your history and perform a physical exam. Lab tests or X-rays may also be ordered, if needed. TREATMENT  Treatment of weakness depends on the cause of your symptoms and can vary greatly. HOME CARE INSTRUCTIONS   Rest as needed.  Eat a well-balanced diet.  Try to get some exercise every day.  Only take over-the-counter or prescription medicines as directed by your caregiver. SEEK MEDICAL CARE IF:   Your weakness seems to be getting worse or spreads to other parts of your body.  You develop new aches or pains. SEEK IMMEDIATE MEDICAL CARE IF:   You cannot perform your normal daily activities, such as getting dressed and feeding yourself.  You cannot walk up and down stairs, or you feel exhausted  when you do so.  You have shortness of breath or chest pain.  You have difficulty moving parts of your body.  You have weakness in only one area of the body or on only one side of the body.  You have a fever.  You have trouble speaking or swallowing.  You cannot control your bladder or bowel movements.  You have black or bloody vomit or stools. MAKE SURE YOU:  Understand these instructions.  Will watch your condition.  Will get help right away if you are not doing well or get worse. Document Released: 08/25/2005 Document Revised: 02/24/2012 Document Reviewed: 10/24/2011 [facility] Patient Information 2015 [place], [place]. This information is not intended to replace advice given to you by your health care provider. Make sure you discuss any questions you have with your health care provider.  Urinary Tract Infection Urinary tract infections (UTIs) can develop anywhere along your urinary tract. Your urinary tract is your body's drainage system for removing wastes and extra water. Your urinary tract includes two kidneys, two ureters, a bladder, and a urethra. Your kidneys are a pair of bean-shaped organs. Each kidney is about the size of your fist. They are located below your ribs, one on each side of your spine. CAUSES Infections are caused by microbes, which are microscopic organisms, including  fungi, viruses, and bacteria. These organisms are so small that they can only be seen through a microscope. Bacteria are the microbes that most commonly cause UTIs. SYMPTOMS  Symptoms of UTIs may vary by age and gender of the patient and by the location of the infection. Symptoms in young women typically include a frequent and intense urge to urinate and a painful, burning feeling in the bladder or urethra during urination. Older women and men are more likely to be tired, shaky, and weak and have muscle aches and abdominal pain. A fever may mean the infection is in your kidneys. Other symptoms of a  kidney infection include pain in your back or sides below the ribs, nausea, and vomiting. DIAGNOSIS To diagnose a UTI, your caregiver will ask you about your symptoms. Your caregiver also will ask to provide a urine sample. The urine sample will be tested for bacteria and white blood cells. White blood cells are made by your body to help fight infection. TREATMENT  Typically, UTIs can be treated with medication. Because most UTIs are caused by a bacterial infection, they usually can be treated with the use of antibiotics. The choice of antibiotic and length of treatment depend on your symptoms and the type of bacteria causing your infection. HOME CARE INSTRUCTIONS  If you were prescribed antibiotics, take them exactly as your caregiver instructs you. Finish the medication even if you feel better after you have only taken some of the medication.  Drink enough water and fluids to keep your urine clear or pale yellow.  Avoid caffeine, tea, and carbonated beverages. They tend to irritate your bladder.  Empty your bladder often. Avoid holding urine for long periods of time.  Empty your bladder before and after sexual intercourse.  After a bowel movement, women should cleanse from front to back. Use each tissue only once. SEEK MEDICAL CARE IF:   You have back pain.  You develop a fever.  Your symptoms do not begin to resolve within 3 days. SEEK IMMEDIATE MEDICAL CARE IF:   You have severe back pain or lower abdominal pain.  You develop chills.  You have nausea or vomiting.  You have continued burning or discomfort with urination. MAKE SURE YOU:   Understand these instructions.  Will watch your condition.  Will get help right away if you are not doing well or get worse. Document Released: 06/04/2005 Document Revised: 02/24/2012 Document Reviewed: 10/03/2011 [facility] Patient Information 2015 [place], [place]. This information is not intended to replace advice given to you by your  health care provider. Make sure you discuss any questions you have with your health care provider.

## 2015-04-26 NOTE — ED Provider Notes (Signed)
I assumed care from Dr. Reita Cliche 11:00 PM. Dr. Reita Cliche casted that I follow up on the patient's repeat troponin and if negative the patient would be able to be discharged home Patient's repeat troponin 0.07 which was decreased from 0.08 reviewed the patient's previous troponins revealed persistently elevated troponin in the setting of a creatinine of 2.04. As such patient was discharged home based on Dr. Reita Cliche recommendations were correct patient follow-up on the outpatient setting.  Gregor Hams, MD 04/30/15 [phone]

## 2015-05-04 ENCOUNTER — Inpatient Hospital Stay: Payer: Medicare Other | Admitting: Internal Medicine

## 2015-05-07 ENCOUNTER — Inpatient Hospital Stay: Payer: Medicare Other | Attending: Internal Medicine | Admitting: Oncology

## 2015-05-07 ENCOUNTER — Inpatient Hospital Stay: Payer: Medicare Other

## 2015-05-07 VITALS — BP 151/81 | HR 82 | Temp 97.7°F | Resp 16

## 2015-05-07 DIAGNOSIS — E780 Pure hypercholesterolemia: Secondary | ICD-10-CM | POA: Diagnosis not present

## 2015-05-07 DIAGNOSIS — D509 Iron deficiency anemia, unspecified: Secondary | ICD-10-CM | POA: Insufficient documentation

## 2015-05-07 DIAGNOSIS — J449 Chronic obstructive pulmonary disease, unspecified: Secondary | ICD-10-CM | POA: Diagnosis not present

## 2015-05-07 DIAGNOSIS — I4891 Unspecified atrial fibrillation: Secondary | ICD-10-CM | POA: Insufficient documentation

## 2015-05-07 DIAGNOSIS — D539 Nutritional anemia, unspecified: Secondary | ICD-10-CM

## 2015-05-07 DIAGNOSIS — I10 Essential (primary) hypertension: Secondary | ICD-10-CM | POA: Insufficient documentation

## 2015-05-07 DIAGNOSIS — N189 Chronic kidney disease, unspecified: Secondary | ICD-10-CM | POA: Insufficient documentation

## 2015-05-07 DIAGNOSIS — Z79899 Other long term (current) drug therapy: Secondary | ICD-10-CM | POA: Insufficient documentation

## 2015-05-07 DIAGNOSIS — E119 Type 2 diabetes mellitus without complications: Secondary | ICD-10-CM | POA: Insufficient documentation

## 2015-05-07 DIAGNOSIS — F17210 Nicotine dependence, cigarettes, uncomplicated: Secondary | ICD-10-CM | POA: Diagnosis not present

## 2015-05-07 LAB — RETICULOCYTES
RBC.: 4.13 MIL/uL (ref 3.80–5.20)
RETIC COUNT ABSOLUTE: 115.6 10*3/uL (ref 19.0–183.0)
Retic Ct Pct: 2.8 % (ref 0.4–3.1)

## 2015-05-07 LAB — FERRITIN: Ferritin: 19 ng/mL (ref 11–307)

## 2015-05-07 LAB — LACTATE DEHYDROGENASE: LDH: 265 U/L — ABNORMAL HIGH (ref 98–192)

## 2015-05-07 LAB — VITAMIN B12: Vitamin B-12: 333 pg/mL (ref 180–914)

## 2015-05-07 LAB — CBC
HEMATOCRIT: 28.9 % — AB (ref 35.0–47.0)
HEMOGLOBIN: 8.8 g/dL — AB (ref 12.0–16.0)
MCH: 21.2 pg — ABNORMAL LOW (ref 26.0–34.0)
MCHC: 30.4 g/dL — ABNORMAL LOW (ref 32.0–36.0)
MCV: 69.8 fL — AB (ref 80.0–100.0)
Platelets: 237 10*3/uL (ref 150–440)
RBC: 4.13 MIL/uL (ref 3.80–5.20)
RDW: 18.9 % — ABNORMAL HIGH (ref 11.5–14.5)
WBC: 8.8 10*3/uL (ref 3.6–11.0)

## 2015-05-07 LAB — IRON AND TIBC
IRON: 16 ug/dL — AB (ref 28–170)
Saturation Ratios: 5 % — ABNORMAL LOW (ref 10.4–31.8)
TIBC: 332 ug/dL (ref 250–450)
UIBC: 316 ug/dL

## 2015-05-07 LAB — DAT, POLYSPECIFIC AHG (ARMC ONLY): POLYSPECIFIC AHG TEST: NEGATIVE

## 2015-05-07 LAB — SEDIMENTATION RATE: SED RATE: 35 mm/h — AB (ref 0–30)

## 2015-05-07 LAB — FOLATE: Folate: 17 ng/mL (ref >5.9)

## 2015-05-07 NOTE — Progress Notes (Signed)
Patient is a resident at [facility] and she is here for new patient evaluation for anemia with low hemoglobin from recent hospital visit.

## 2015-05-07 NOTE — Progress Notes (Signed)
Patient has not taken in last 30 days 

## 2015-05-08 LAB — ERYTHROPOIETIN: ERYTHROPOIETIN: 184 m[IU]/mL — AB (ref 2.6–18.5)

## 2015-05-08 LAB — ANA W/REFLEX: Anti Nuclear Antibody(ANA): NEGATIVE

## 2015-05-08 LAB — HAPTOGLOBIN: HAPTOGLOBIN: 156 mg/dL (ref 34–200)

## 2015-05-11 NOTE — Progress Notes (Signed)
[place]  [phone] [phone]  ID: Mackenzie Rose OB: 11/23/1945  MR#: [id number]  GQQ#:[id number]  Patient Care Team: Lavera Guise, MD as PCP - General (Internal Medicine)  CHIEF COMPLAINT:  Chief Complaint  Patient presents with  . New Evaluation    anemia    INTERVAL HISTORY: Patient is a 68 year old female who was recently found to have a decreased hemoglobin. She has chronic weakness and fatigue, but otherwise feels well. She has no neurologic complaints. She denies any recent fevers. She denies any chest pain or shortness of breath. She denies any nausea, vomiting, constipation, or diarrhea. She has no melanoma or hematochezia. Patient otherwise feels well and offers no further specific complaints.  REVIEW OF SYSTEMS:   Review of Systems  Constitutional: Positive for malaise/fatigue. Negative for fever.  Respiratory: Negative.   Cardiovascular: Negative.   Gastrointestinal: Negative.  Negative for melena.  Neurological: Positive for weakness.    As per HPI. Otherwise, a complete review of systems is negatve.  PAST MEDICAL HISTORY: Past Medical History  Diagnosis Date  . Heart disease   . Atrial fibrillation     a. reported a-fib; b. unknown chronicity; c. dates back to 1970s; d. not on long term anticoagulation 2/2 no documented a-fib  . Hypertension   . High cholesterol   . Diabetes     borderline  . COPD (chronic obstructive pulmonary disease)   . Melanoma   . Kidney disorder   . Tremor   . Autoimmune disease   . Depressed   . Anxiety disorder   . Migraine   . Dementia   . Motion sickness   . Myasthenia gravis   . Bipolar disorder   . Dementia   . Acute renal failure   . Near syncope     PAST SURGICAL HISTORY: Past Surgical History  Procedure Laterality Date  . Tubal ligation    . Retena repair    . Cataract extraction    . Cholecystectomy      FAMILY HISTORY Family History  Problem Relation Age of  Onset  . Heart disease Father   . Other Mother     ruptured hernia  . Breast cancer Sister   . Heart disease Sister   . Diabetes Mellitus II Sister     x2       ADVANCED DIRECTIVES:    HEALTH MAINTENANCE: Social History  Substance Use Topics  . Smoking status: Current Some Day Smoker -- 2.00 packs/day for 50 years    Types: Cigarettes    Start date: 03/28/1965  . Smokeless tobacco: Never Used  . Alcohol Use: No     Comment: Occasionally     Colonoscopy:  PAP:  Bone density:  Lipid panel:  Allergies  Allergen Reactions  . Lactose Intolerance (Gi) Other (See Comments)    Reaction:  GI upset   . Lithium Other (See Comments)    Reaction:  Unknown   . Penicillins Rash    Current Outpatient Prescriptions  Medication Sig Dispense Refill  . acetaminophen (TYLENOL) 650 MG CR tablet Take 650 mg by mouth daily. Pt takes daily at 2pm and every four hours as needed for pain.    Marland Kitchen albuterol (PROVENTIL HFA;VENTOLIN HFA) 108 (90 BASE) MCG/ACT inhaler Inhale 2 puffs into the lungs every 4 (four) hours as needed for wheezing or shortness of breath. 6.7 g 1  . bismuth subsalicylate (PEPTO BISMOL) 262 MG/15ML suspension Take 5 mLs by mouth 4 (four) times daily  as needed for diarrhea or loose stools (or nausea).    . busPIRone (BUSPAR) 10 MG tablet     . chlorpheniramine-HYDROcodone (TUSSIONEX) 10-8 MG/5ML SUER Take 5 mLs by mouth every 12 (twelve) hours as needed for cough. 115 mL 0  . Dextromethorphan-Benzocaine (SORE THROAT & COUGH LOZENGES MT) Use as directed 1 lozenge in the mouth or throat 4 (four) times daily as needed (for cough).    . diclofenac sodium (VOLTAREN) 1 % GEL Apply 2 g topically 2 (two) times daily.     Marland Kitchen diltiazem (CARDIZEM CD) 180 MG 24 hr capsule Take 180 mg by mouth daily.     Marland Kitchen donepezil (ARICEPT) 10 MG tablet Take 1 tablet (10 mg total) by mouth daily. 30 tablet 3  . Fluticasone-Salmeterol (ADVAIR) 250-50 MCG/DOSE AEPB Inhale 1 puff into the lungs 2 (two) times  daily.    Marland Kitchen glimepiride (AMARYL) 1 MG tablet Take 1 mg by mouth 2 (two) times daily with a meal.    . guaifenesin (ROBITUSSIN) 100 MG/5ML syrup Take 100 mg by mouth 2 (two) times daily as needed for cough.    . hydrALAZINE (APRESOLINE) 10 MG tablet Take 10 mg by mouth 3 (three) times daily.    Marland Kitchen latanoprost (XALATAN) 0.005 % ophthalmic solution Place 1 drop into both eyes at bedtime.     . nicotine (NICODERM CQ - DOSED IN MG/24 HR) 7 mg/24hr patch Place 1 patch (7 mg total) onto the skin daily. 15 patch 0  . phenylephrine-shark liver oil-mineral oil-petrolatum (PREPARATION H) 0.25-3-14-71.9 % rectal ointment Place 1 application rectally 2 (two) times daily as needed for hemorrhoids.    . polyethylene glycol (MIRALAX / GLYCOLAX) packet Take 17 g by mouth daily as needed for mild constipation.    . predniSONE (DELTASONE) 20 MG tablet Take 30 mg by mouth every other day.    . pyridostigmine (MESTINON) 60 MG tablet Take 60 mg by mouth 3 (three) times daily.     . QUEtiapine (SEROQUEL) 200 MG tablet Take 1 tablet (200 mg total) by mouth at bedtime. 30 tablet 3  . sodium chloride (OCEAN) 0.65 % SOLN nasal spray Place 2 sprays into both nostrils daily as needed for congestion.     . traZODone (DESYREL) 50 MG tablet Take 1 tablet (50 mg total) by mouth at bedtime. Take 1/2 at bedtime. (Patient taking differently: Take 50 mg by mouth at bedtime. ) 30 tablet 2   No current facility-administered medications for this visit.    OBJECTIVE: Filed Vitals:   05/07/15 0855  BP: 151/81  Pulse: 82  Temp: 97.7 F (36.5 C)  Resp: 16     There is no weight on file to calculate BMI.    ECOG FS:1 - Symptomatic but completely ambulatory  General: Well-developed, well-nourished, no acute distress. Eyes: Pink conjunctiva, anicteric sclera. HEENT: Normocephalic, moist mucous membranes, clear oropharnyx. Lungs: Clear to auscultation bilaterally. Heart: Regular rate and rhythm. No rubs, murmurs, or  gallops. Abdomen: Soft, nontender, nondistended. No organomegaly noted, normoactive bowel sounds. Musculoskeletal: No edema, cyanosis, or clubbing. Neuro: Alert, answering all questions appropriately. Cranial nerves grossly intact. Skin: No rashes or petechiae noted. Psych: Normal affect. Lymphatics: No cervical, calvicular, axillary or inguinal LAD.   LAB RESULTS:  Lab Results  Component Value Date   NA 137 04/25/2015   K 3.2* 04/25/2015   CL 102 04/25/2015   CO2 24 04/25/2015   GLUCOSE 182* 04/25/2015   BUN 29* 04/25/2015   CREATININE 2.04* 04/25/2015  CALCIUM 8.6* 04/25/2015   PROT 5.7* 04/25/2015   ALBUMIN 3.1* 04/25/2015   AST 31 04/25/2015   ALT 21 04/25/2015   ALKPHOS 88 04/25/2015   BILITOT <0.1* 04/25/2015   GFRNONAA 23* 04/25/2015   GFRAA 27* 04/25/2015    Lab Results  Component Value Date   WBC 8.8 05/07/2015   NEUTROABS 10.7* 04/25/2015   HGB 8.8* 05/07/2015   HCT 28.9* 05/07/2015   MCV 69.8* 05/07/2015   PLT 237 05/07/2015     STUDIES: Dg Chest 2 View  04/23/2015   CLINICAL DATA:  Shortness of breath.  EXAM: CHEST  2 VIEW  COMPARISON:  03/25/2015  FINDINGS: Heart is mildly enlarged. Moderate-sized hiatal hernia. Lungs are clear. No effusions. No acute bony abnormality.  IMPRESSION: Mild cardiomegaly. Moderate-sized hiatal hernia. No active disease.   Electronically Signed   By: Rolm Baptise M.D.   On: 04/23/2015 16:46   Dg Chest Port 1 View  04/25/2015   CLINICAL DATA:  Difficulty breathing.  Constricted pupils.  EXAM: PORTABLE CHEST - 1 VIEW  COMPARISON:  04/23/2015  FINDINGS: There is no focal parenchymal opacity. There is no pleural effusion or pneumothorax. There is mild cardiomegaly.  The osseous structures are unremarkable.  IMPRESSION: No active disease.   Electronically Signed   By: Kathreen Devoid   On: 04/25/2015 21:57    ASSESSMENT: Iron deficiency anemia.  PLAN:    1. Iron deficiency anemia: Patient was found to have significantly reduced  hemoglobin as well as iron stores. Both her erythropoietin level and reticulocyte count are appropriately elevated. The remainder of her laboratory work was either negative or within normal limits. Patient was also noted to have chronic renal insufficiency and may benefit from Procrit in the future. Return to clinic in 2 weeks for discussion of her laboratory work and consideration of IV Feraheme. 2. Chronic renal insufficiency: Treatment per nephrology.  Patient expressed understanding and was in agreement with this plan. She also understands that She can call clinic at any time with any questions, concerns, or complaints.    Lloyd Huger, MD   05/11/2015 8:51 AM

## 2015-05-16 ENCOUNTER — Encounter: Payer: Self-pay | Admitting: Emergency Medicine

## 2015-05-16 ENCOUNTER — Emergency Department: Payer: Medicare Other

## 2015-05-16 ENCOUNTER — Inpatient Hospital Stay
Admission: EM | Admit: 2015-05-16 | Discharge: 2015-05-20 | DRG: 683 | Disposition: A | Discharge status: Home / Self Care | Payer: Medicare Other | Attending: Internal Medicine | Admitting: Internal Medicine

## 2015-05-16 DIAGNOSIS — E876 Hypokalemia: Secondary | ICD-10-CM | POA: Diagnosis present

## 2015-05-16 DIAGNOSIS — J449 Chronic obstructive pulmonary disease, unspecified: Secondary | ICD-10-CM | POA: Diagnosis present

## 2015-05-16 DIAGNOSIS — R7989 Other specified abnormal findings of blood chemistry: Secondary | ICD-10-CM | POA: Diagnosis not present

## 2015-05-16 DIAGNOSIS — D500 Iron deficiency anemia secondary to blood loss (chronic): Secondary | ICD-10-CM

## 2015-05-16 DIAGNOSIS — I519 Heart disease, unspecified: Secondary | ICD-10-CM | POA: Diagnosis present

## 2015-05-16 DIAGNOSIS — Z9851 Tubal ligation status: Secondary | ICD-10-CM

## 2015-05-16 DIAGNOSIS — Z880 Allergy status to penicillin: Secondary | ICD-10-CM

## 2015-05-16 DIAGNOSIS — Z79899 Other long term (current) drug therapy: Secondary | ICD-10-CM

## 2015-05-16 DIAGNOSIS — N179 Acute kidney failure, unspecified: Principal | ICD-10-CM | POA: Diagnosis present

## 2015-05-16 DIAGNOSIS — F0390 Unspecified dementia without behavioral disturbance: Secondary | ICD-10-CM | POA: Diagnosis present

## 2015-05-16 DIAGNOSIS — S0101XA Laceration without foreign body of scalp, initial encounter: Secondary | ICD-10-CM | POA: Diagnosis present

## 2015-05-16 DIAGNOSIS — R748 Abnormal levels of other serum enzymes: Secondary | ICD-10-CM | POA: Diagnosis present

## 2015-05-16 DIAGNOSIS — E860 Dehydration: Secondary | ICD-10-CM | POA: Diagnosis present

## 2015-05-16 DIAGNOSIS — Z7952 Long term (current) use of systemic steroids: Secondary | ICD-10-CM

## 2015-05-16 DIAGNOSIS — R296 Repeated falls: Secondary | ICD-10-CM | POA: Diagnosis present

## 2015-05-16 DIAGNOSIS — Z79891 Long term (current) use of opiate analgesic: Secondary | ICD-10-CM

## 2015-05-16 DIAGNOSIS — N184 Chronic kidney disease, stage 4 (severe): Secondary | ICD-10-CM | POA: Diagnosis present

## 2015-05-16 DIAGNOSIS — N390 Urinary tract infection, site not specified: Secondary | ICD-10-CM | POA: Diagnosis present

## 2015-05-16 DIAGNOSIS — Z9849 Cataract extraction status, unspecified eye: Secondary | ICD-10-CM

## 2015-05-16 DIAGNOSIS — R778 Other specified abnormalities of plasma proteins: Secondary | ICD-10-CM

## 2015-05-16 DIAGNOSIS — I482 Chronic atrial fibrillation: Secondary | ICD-10-CM | POA: Diagnosis present

## 2015-05-16 DIAGNOSIS — R55 Syncope and collapse: Secondary | ICD-10-CM | POA: Diagnosis present

## 2015-05-16 DIAGNOSIS — I129 Hypertensive chronic kidney disease with stage 1 through stage 4 chronic kidney disease, or unspecified chronic kidney disease: Secondary | ICD-10-CM | POA: Diagnosis present

## 2015-05-16 DIAGNOSIS — F17210 Nicotine dependence, cigarettes, uncomplicated: Secondary | ICD-10-CM | POA: Diagnosis present

## 2015-05-16 DIAGNOSIS — Z90710 Acquired absence of both cervix and uterus: Secondary | ICD-10-CM

## 2015-05-16 DIAGNOSIS — Z8249 Family history of ischemic heart disease and other diseases of the circulatory system: Secondary | ICD-10-CM

## 2015-05-16 DIAGNOSIS — Z85820 Personal history of malignant melanoma of skin: Secondary | ICD-10-CM

## 2015-05-16 DIAGNOSIS — W19XXXA Unspecified fall, initial encounter: Secondary | ICD-10-CM | POA: Diagnosis present

## 2015-05-16 DIAGNOSIS — Z803 Family history of malignant neoplasm of breast: Secondary | ICD-10-CM

## 2015-05-16 DIAGNOSIS — Z9049 Acquired absence of other specified parts of digestive tract: Secondary | ICD-10-CM | POA: Diagnosis present

## 2015-05-16 DIAGNOSIS — R9431 Abnormal electrocardiogram [ECG] [EKG]: Secondary | ICD-10-CM

## 2015-05-16 DIAGNOSIS — G7000 Myasthenia gravis without (acute) exacerbation: Secondary | ICD-10-CM | POA: Diagnosis present

## 2015-05-16 DIAGNOSIS — Z888 Allergy status to other drugs, medicaments and biological substances status: Secondary | ICD-10-CM

## 2015-05-16 DIAGNOSIS — Z833 Family history of diabetes mellitus: Secondary | ICD-10-CM

## 2015-05-16 MED ORDER — LIDOCAINE-EPINEPHRINE (PF) 1 %-1:200000 IJ SOLN
INTRAMUSCULAR | Status: AC
  Administered 2015-05-17: 30 mL
  Filled 2015-05-16: qty 30

## 2015-05-16 NOTE — ED Notes (Signed)
Sitting out on porch this evening and had already taken night time medicine, which makes her sleepy.  Went to stand up to get into the house and fell, hitting back of head on hand rail.  Patient is a resident of [facility].  Denies LOC.

## 2015-05-16 NOTE — ED Provider Notes (Signed)
[facility] Emergency Department Provider Note  ____________________________________________  Time seen: 11:20 PM  I have reviewed the triage vital signs and the nursing notes.   HISTORY  Chief Complaint Fall      HPI Quantina Dershem is a 69 y.o. female presents with accidental fall tonight striking her occiput on concrete per patient. Ms. Baumgarner states that she took her nighttime meds and was sitting on the porch, she states that one of the medications he takes makes her "very very relaxed"which she suspected was the cause of her fall. Patient denies any preceding symptoms no nausea no vomiting chest pain palpitation or dyspnea. Patient however cannot identify a reason for her losing consciousness     Past Medical History  Diagnosis Date  . Heart disease   . Atrial fibrillation     a. reported a-fib; b. unknown chronicity; c. dates back to 1970s; d. not on long term anticoagulation 2/2 no documented a-fib  . Hypertension   . High cholesterol   . Diabetes     borderline  . COPD (chronic obstructive pulmonary disease)   . Melanoma   . Kidney disorder   . Tremor   . Autoimmune disease   . Depressed   . Anxiety disorder   . Migraine   . Dementia   . Motion sickness   . Myasthenia gravis   . Bipolar disorder   . Dementia   . Acute renal failure   . Near syncope     Patient Active Problem List   Diagnosis Date Noted  . Near syncope 03/25/2015  . ARF (acute renal failure) 03/25/2015  . COPD exacerbation 03/25/2015  . Anxiety 12/12/2014  . Bipolar affective disorder, mixed 12/12/2014  . Renal insufficiency syndrome 12/12/2014  . H/O: HTN (hypertension) 12/12/2014  . H/O diabetes mellitus 12/12/2014  . CAFL (chronic airflow limitation) 12/12/2014  . Bipolar disorder   . Dementia   . Ulnar neuropathy at elbow of right upper extremity 09/22/2014  . Myasthenia gravis 11/22/2013    Past Surgical History  Procedure Laterality Date  . Tubal  ligation    . Retena repair    . Cataract extraction    . Cholecystectomy      Current Outpatient Rx  Name  Route  Sig  Dispense  Refill  . acetaminophen (TYLENOL) 650 MG CR tablet   Oral   Take 650 mg by mouth daily. Pt takes daily at 2pm and every four hours as needed for pain.         . busPIRone (BUSPAR) 10 MG tablet   Oral   Take 10 mg by mouth 2 (two) times daily.          . ciprofloxacin (CIPRO) 500 MG tablet   Oral   Take 500 mg by mouth 2 (two) times daily. For 14 days         . diclofenac sodium (VOLTAREN) 1 % GEL   Topical   Apply 2 g topically 2 (two) times daily.          Marland Kitchen diltiazem (CARDIZEM CD) 180 MG 24 hr capsule   Oral   Take 180 mg by mouth daily.          Marland Kitchen donepezil (ARICEPT) 10 MG tablet   Oral   Take 1 tablet (10 mg total) by mouth daily.   30 tablet   3   . Fluticasone-Salmeterol (ADVAIR) 250-50 MCG/DOSE AEPB   Inhalation   Inhale 1 puff into the lungs 2 (two)  times daily.         . hydrALAZINE (APRESOLINE) 10 MG tablet   Oral   Take 10 mg by mouth 3 (three) times daily.         Marland Kitchen lamoTRIgine (LAMICTAL) 100 MG tablet   Oral   Take 100 mg by mouth 2 (two) times daily.         Marland Kitchen latanoprost (XALATAN) 0.005 % ophthalmic solution   Both Eyes   Place 1 drop into both eyes at bedtime.          . nicotine (NICODERM CQ - DOSED IN MG/24 HR) 7 mg/24hr patch   Transdermal   Place 1 patch (7 mg total) onto the skin daily.   15 patch   0   . predniSONE (DELTASONE) 10 MG tablet   Oral   Take 30 mg by mouth every other day.         . pyridostigmine (MESTINON) 60 MG tablet   Oral   Take 60 mg by mouth 3 (three) times daily.          . QUEtiapine (SEROQUEL) 200 MG tablet   Oral   Take 1 tablet (200 mg total) by mouth at bedtime.   30 tablet   3   . traMADol (ULTRAM) 50 MG tablet   Oral   Take 50 mg by mouth 2 (two) times daily.         . traZODone (DESYREL) 50 MG tablet   Oral   Take 1 tablet (50 mg total) by  mouth at bedtime. Take 1/2 at bedtime. Patient taking differently: Take 50 mg by mouth at bedtime.    30 tablet   2   . albuterol (PROVENTIL HFA;VENTOLIN HFA) 108 (90 BASE) MCG/ACT inhaler   Inhalation   Inhale 2 puffs into the lungs every 4 (four) hours as needed for wheezing or shortness of breath.   6.7 g   1   . bismuth subsalicylate (PEPTO BISMOL) 262 MG/15ML suspension   Oral   Take 5 mLs by mouth 4 (four) times daily as needed for diarrhea or loose stools (or nausea).         . chlorpheniramine-HYDROcodone (TUSSIONEX) 10-8 MG/5ML SUER   Oral   Take 5 mLs by mouth every 12 (twelve) hours as needed for cough. Patient not taking: Reported on 05/16/2015   115 mL   0   . Dextromethorphan-Benzocaine (SORE THROAT & COUGH LOZENGES MT)   Mouth/Throat   Use as directed 1 lozenge in the mouth or throat 4 (four) times daily as needed (for cough).         Marland Kitchen guaifenesin (ROBITUSSIN) 100 MG/5ML syrup   Oral   Take 100 mg by mouth 2 (two) times daily as needed for cough.         . phenylephrine-shark liver oil-mineral oil-petrolatum (PREPARATION H) 0.25-3-14-71.9 % rectal ointment   Rectal   Place 1 application rectally 2 (two) times daily as needed for hemorrhoids.         . polyethylene glycol (MIRALAX / GLYCOLAX) packet   Oral   Take 17 g by mouth daily as needed for mild constipation.         . sodium chloride (OCEAN) 0.65 % SOLN nasal spray   Each Nare   Place 2 sprays into both nostrils daily as needed for congestion.            Allergies Lactose intolerance (gi); Lithium; and Penicillins  Family History  Problem Relation  Age of Onset  . Heart disease Father   . Other Mother     ruptured hernia  . Breast cancer Sister   . Heart disease Sister   . Diabetes Mellitus II Sister     x2    Social History Social History  Substance Use Topics  . Smoking status: Current Some Day Smoker -- 2.00 packs/day for 50 years    Types: Cigarettes    Start date:  03/28/1965  . Smokeless tobacco: Never Used  . Alcohol Use: No     Comment: Occasionally    Review of Systems  Constitutional: Negative for fever. Eyes: Negative for visual changes. ENT: Negative for sore throat. Cardiovascular: Negative for chest pain. Respiratory: Negative for shortness of breath. Gastrointestinal: Negative for abdominal pain, vomiting and diarrhea. Genitourinary: Negative for dysuria. Musculoskeletal: Negative for back pain. Skin:  Positive Laceration to the occiput Neurological: Negative for headaches, focal weakness or numbness.   10-point ROS otherwise negative.  ____________________________________________   PHYSICAL EXAM:  VITAL SIGNS: ED Triage Vitals  Enc Vitals Group     BP 05/16/15 2137 132/66 mmHg     Pulse Rate 05/16/15 2137 93     Resp 05/16/15 2137 18     Temp 05/16/15 2137 98.2 F (36.8 C)     Temp Source 05/16/15 2137 Oral     SpO2 05/16/15 2137 100 %     Weight 05/16/15 2137 135 lb (61.236 kg)     Height 05/16/15 2137 5\' 4"  (1.626 m)     Head Cir --      Peak Flow --      Pain Score 05/16/15 2138 5     Pain Loc --      Pain Edu? --      Excl. in Scenic? --      Constitutional: Alert and oriented. Well appearing and in no distress. Eyes: Conjunctivae are normal. PERRL. Normal extraocular movements. ENT   Head: Normocephalic and atraumatic.   Nose: No congestion/rhinnorhea.   Mouth/Throat: Mucous membranes are moist.   Neck: No stridor. Hematological/Lymphatic/Immunilogical: No cervical lymphadenopathy. Cardiovascular: Normal rate, regular rhythm. Normal and symmetric distal pulses are present in all extremities. No murmurs, rubs, or gallops. Respiratory: Normal respiratory effort without tachypnea nor retractions. Breath sounds are clear and equal bilaterally. No wheezes/rales/rhonchi. Gastrointestinal: Soft and nontender. No distention. There is no CVA tenderness. Genitourinary: deferred Musculoskeletal: Nontender  with normal range of motion in all extremities. No joint effusions.  No lower extremity tenderness nor edema. Neurologic:  Normal speech and language. No gross focal neurologic deficits are appreciated. Speech is normal.  Skin: One and a half inch left occipital linear laceration Psychiatric: Mood and affect are normal. Speech and behavior are normal. Patient exhibits appropriate insight and judgment.  ____________________________________________    LABS (pertinent positives/negatives)  Labs Reviewed  CBC - Abnormal; Notable for the following:    Hemoglobin 8.6 (*)    HCT 29.1 (*)    MCV 69.6 (*)    MCH 20.5 (*)    MCHC 29.5 (*)    RDW 19.6 (*)    All other components within normal limits  COMPREHENSIVE METABOLIC PANEL - Abnormal; Notable for the following:    Potassium 2.6 (*)    Glucose, Bld 234 (*)    Creatinine, Ser 1.77 (*)    Calcium 8.4 (*)    Total Protein 5.2 (*)    Albumin 2.9 (*)    Total Bilirubin 0.2 (*)    GFR calc non  Af Amer 28 (*)    GFR calc Af Amer 32 (*)    All other components within normal limits  TROPONIN I - Abnormal; Notable for the following:    Troponin I 0.05 (*)    All other components within normal limits     ____________________________________________   EKG  ED ECG REPORT I, Ulyana Pitones, Leon N, the attending physician, personally viewed and interpreted this ECG.   Date: 05/17/2015  EKG Time: 3:23 AM  Rate: 87  Rhythm: Normal Sinus rhythm  Axis: None  Intervals: Prolonged QTC as well as QT interval  ST&T Change: None   ____________________________________________    RADIOLOGY  CT Head Wo Contrast (Final result) Result time: 05/17/15 00:13:30   Final result by Rad Results In Interface (05/17/15 00:13:30)   Narrative:   CLINICAL DATA: Patient fell striking back of head on a hand rail. No loss of consciousness.  EXAM: CT HEAD WITHOUT CONTRAST  TECHNIQUE: Contiguous axial images were obtained from the base of the  skull through the vertex without intravenous contrast.  COMPARISON: 01/31/2014  FINDINGS: Mild diffuse cerebral atrophy. No ventricular dilatation. Patchy low-attenuation changes in the deep white matter consistent with small vessel ischemia. Focal areas of encephalomalacia in the right frontal and left posterior parietal regions are unchanged since prior study and probably represent old infarcts. No mass effect or midline shift. No abnormal extra-axial fluid collections. Gray-white matter junctions are distinct. Basal cisterns are not effaced. No evidence of acute intracranial hemorrhage. No depressed skull fractures. Mucosal thickening in the maxillary antra. Mastoid air cells are not opacified. Subcutaneous emphysema in the scalp over the left posterior parietal/ occipital region.  IMPRESSION: No acute intracranial abnormalities. Chronic atrophy and small vessel ischemic changes. 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 Subcutaneous scalp laceration over the left occipital/ parietal region.   Electronically Signed By: Lucienne Capers M.D. On: 05/17/2015 00:13        ____________________________________________   PROCEDURES  Procedure(s) performed: LACERATION REPAIR Performed by: Gregor Hams Authorized by: Gregor Hams Consent: Verbal consent obtained. Risks and benefits: risks, benefits and alternatives were discussed Consent given by: patient Patient identity confirmed: provided demographic data Prepped and Draped in normal sterile fashion Wound explored  Laceration Location: Left occipital scalp  Laceration Length: 1 1/2 inch  No Foreign Bodies seen or palpated  Anesthesia: local infiltration  Local anesthetic: lidocaine 1%   Anesthetic total: 5 ml  Irrigation method: syringe Amount of cleaning: standard  Skin closure: 4 Staples     Patient tolerance: Patient tolerated the procedure well with no  immediate complications.    ____________________________________________   INITIAL IMPRESSION / ASSESSMENT AND PLAN / ED COURSE  Pertinent labs & imaging results that were available during my care of the patient were reviewed by me and considered in my medical decision making (see chart for details).  Of note patient CT scan revealed too old infarcts in the right frontal and left posterior parietal regions. I asked the patient if she was aware of any strokes in the past to which she admitted that she's had "mini strokes but never any full stroke". Patient's troponin positive at 0.05 in the setting of a syncopal episode. As such patient will be admitted to the hospital for further cardiac evaluation. Patient was discussed with Dr. Lavetta Nielsen  ____________________________________________   FINAL CLINICAL IMPRESSION(S) / ED DIAGNOSES  Final diagnoses:  Syncope, unspecified syncope type  Elevated troponin  QT prolongation  Gregor Hams, MD 05/17/15 [phone]

## 2015-05-17 ENCOUNTER — Ambulatory Visit: Payer: Medicare Other

## 2015-05-17 ENCOUNTER — Ambulatory Visit: Payer: Medicare Other | Admitting: Oncology

## 2015-05-17 DIAGNOSIS — K922 Gastrointestinal hemorrhage, unspecified: Secondary | ICD-10-CM | POA: Diagnosis not present

## 2015-05-17 DIAGNOSIS — N184 Chronic kidney disease, stage 4 (severe): Secondary | ICD-10-CM | POA: Diagnosis present

## 2015-05-17 DIAGNOSIS — R748 Abnormal levels of other serum enzymes: Secondary | ICD-10-CM | POA: Diagnosis present

## 2015-05-17 DIAGNOSIS — Z888 Allergy status to other drugs, medicaments and biological substances status: Secondary | ICD-10-CM | POA: Diagnosis not present

## 2015-05-17 DIAGNOSIS — D649 Anemia, unspecified: Secondary | ICD-10-CM | POA: Diagnosis not present

## 2015-05-17 DIAGNOSIS — S0101XA Laceration without foreign body of scalp, initial encounter: Secondary | ICD-10-CM | POA: Diagnosis present

## 2015-05-17 DIAGNOSIS — Z79891 Long term (current) use of opiate analgesic: Secondary | ICD-10-CM | POA: Diagnosis not present

## 2015-05-17 DIAGNOSIS — E876 Hypokalemia: Secondary | ICD-10-CM | POA: Diagnosis present

## 2015-05-17 DIAGNOSIS — Z90710 Acquired absence of both cervix and uterus: Secondary | ICD-10-CM | POA: Diagnosis not present

## 2015-05-17 DIAGNOSIS — Z9049 Acquired absence of other specified parts of digestive tract: Secondary | ICD-10-CM | POA: Diagnosis present

## 2015-05-17 DIAGNOSIS — N179 Acute kidney failure, unspecified: Secondary | ICD-10-CM | POA: Diagnosis present

## 2015-05-17 DIAGNOSIS — G7000 Myasthenia gravis without (acute) exacerbation: Secondary | ICD-10-CM | POA: Diagnosis present

## 2015-05-17 DIAGNOSIS — Z8249 Family history of ischemic heart disease and other diseases of the circulatory system: Secondary | ICD-10-CM | POA: Diagnosis not present

## 2015-05-17 DIAGNOSIS — Z9851 Tubal ligation status: Secondary | ICD-10-CM | POA: Diagnosis not present

## 2015-05-17 DIAGNOSIS — W19XXXA Unspecified fall, initial encounter: Secondary | ICD-10-CM | POA: Diagnosis present

## 2015-05-17 DIAGNOSIS — Z7952 Long term (current) use of systemic steroids: Secondary | ICD-10-CM | POA: Diagnosis not present

## 2015-05-17 DIAGNOSIS — R55 Syncope and collapse: Secondary | ICD-10-CM | POA: Diagnosis present

## 2015-05-17 DIAGNOSIS — Z9849 Cataract extraction status, unspecified eye: Secondary | ICD-10-CM | POA: Diagnosis not present

## 2015-05-17 DIAGNOSIS — I519 Heart disease, unspecified: Secondary | ICD-10-CM | POA: Diagnosis present

## 2015-05-17 DIAGNOSIS — Z880 Allergy status to penicillin: Secondary | ICD-10-CM | POA: Diagnosis not present

## 2015-05-17 DIAGNOSIS — F17210 Nicotine dependence, cigarettes, uncomplicated: Secondary | ICD-10-CM | POA: Diagnosis present

## 2015-05-17 DIAGNOSIS — R7989 Other specified abnormal findings of blood chemistry: Secondary | ICD-10-CM | POA: Diagnosis present

## 2015-05-17 DIAGNOSIS — F0390 Unspecified dementia without behavioral disturbance: Secondary | ICD-10-CM | POA: Diagnosis present

## 2015-05-17 DIAGNOSIS — Z79899 Other long term (current) drug therapy: Secondary | ICD-10-CM | POA: Diagnosis not present

## 2015-05-17 DIAGNOSIS — Z803 Family history of malignant neoplasm of breast: Secondary | ICD-10-CM | POA: Diagnosis not present

## 2015-05-17 DIAGNOSIS — E860 Dehydration: Secondary | ICD-10-CM | POA: Diagnosis present

## 2015-05-17 DIAGNOSIS — Z833 Family history of diabetes mellitus: Secondary | ICD-10-CM | POA: Diagnosis not present

## 2015-05-17 DIAGNOSIS — I129 Hypertensive chronic kidney disease with stage 1 through stage 4 chronic kidney disease, or unspecified chronic kidney disease: Secondary | ICD-10-CM | POA: Diagnosis present

## 2015-05-17 DIAGNOSIS — I482 Chronic atrial fibrillation: Secondary | ICD-10-CM | POA: Diagnosis present

## 2015-05-17 DIAGNOSIS — K921 Melena: Secondary | ICD-10-CM | POA: Diagnosis not present

## 2015-05-17 DIAGNOSIS — Z85820 Personal history of malignant melanoma of skin: Secondary | ICD-10-CM | POA: Diagnosis not present

## 2015-05-17 DIAGNOSIS — R296 Repeated falls: Secondary | ICD-10-CM | POA: Diagnosis present

## 2015-05-17 DIAGNOSIS — N390 Urinary tract infection, site not specified: Secondary | ICD-10-CM | POA: Diagnosis present

## 2015-05-17 DIAGNOSIS — J449 Chronic obstructive pulmonary disease, unspecified: Secondary | ICD-10-CM | POA: Diagnosis present

## 2015-05-17 LAB — CBC
HCT: 29.1 % — ABNORMAL LOW (ref 35.0–47.0)
HEMOGLOBIN: 8.6 g/dL — AB (ref 12.0–16.0)
MCH: 20.5 pg — AB (ref 26.0–34.0)
MCHC: 29.5 g/dL — AB (ref 32.0–36.0)
MCV: 69.6 fL — ABNORMAL LOW (ref 80.0–100.0)
PLATELETS: 212 10*3/uL (ref 150–440)
RBC: 4.18 MIL/uL (ref 3.80–5.20)
RDW: 19.6 % — ABNORMAL HIGH (ref 11.5–14.5)
WBC: 7.1 10*3/uL (ref 3.6–11.0)

## 2015-05-17 LAB — POTASSIUM: Potassium: 3.5 mmol/L (ref 3.5–5.1)

## 2015-05-17 LAB — COMPREHENSIVE METABOLIC PANEL
ALBUMIN: 2.9 g/dL — AB (ref 3.5–5.0)
ALT: 16 U/L (ref 14–54)
ANION GAP: 12 (ref 5–15)
AST: 32 U/L (ref 15–41)
Alkaline Phosphatase: 84 U/L (ref 38–126)
BUN: 18 mg/dL (ref 6–20)
CHLORIDE: 101 mmol/L (ref 101–111)
CO2: 24 mmol/L (ref 22–32)
Calcium: 8.4 mg/dL — ABNORMAL LOW (ref 8.9–10.3)
Creatinine, Ser: 1.77 mg/dL — ABNORMAL HIGH (ref 0.44–1.00)
GFR calc non Af Amer: 28 mL/min — ABNORMAL LOW (ref >60)
GFR, EST AFRICAN AMERICAN: 32 mL/min — AB (ref >60)
GLUCOSE: 234 mg/dL — AB (ref 65–99)
Potassium: 2.6 mmol/L — CL (ref 3.5–5.1)
SODIUM: 137 mmol/L (ref 135–145)
Total Bilirubin: 0.2 mg/dL — ABNORMAL LOW (ref 0.3–1.2)
Total Protein: 5.2 g/dL — ABNORMAL LOW (ref 6.5–8.1)

## 2015-05-17 LAB — TROPONIN I
Troponin I: 0.05 ng/mL — ABNORMAL HIGH (ref <0.031)
Troponin I: 0.06 ng/mL — ABNORMAL HIGH (ref <0.031)
Troponin I: 0.08 ng/mL — ABNORMAL HIGH (ref <0.031)
Troponin I: 0.07 ng/mL — ABNORMAL HIGH (ref <0.031)

## 2015-05-17 LAB — TSH: TSH: 0.549 u[IU]/mL (ref 0.350–4.500)

## 2015-05-17 MED ORDER — BUSPIRONE HCL 10 MG PO TABS
10.0000 mg | ORAL_TABLET | Freq: Two times a day (BID) | ORAL | Status: DC
  Administered 2015-05-17 – 2015-05-20 (×7): 10 mg via ORAL
  Filled 2015-05-17 (×7): qty 1

## 2015-05-17 MED ORDER — LATANOPROST 0.005 % OP SOLN
1.0000 [drp] | Freq: Every day | OPHTHALMIC | Status: DC
  Administered 2015-05-17 – 2015-05-19 (×3): 1 [drp] via OPHTHALMIC
  Filled 2015-05-17: qty 2.5

## 2015-05-17 MED ORDER — MORPHINE SULFATE (PF) 2 MG/ML IV SOLN: 2.0000 mg | INTRAVENOUS | Status: DC | PRN

## 2015-05-17 MED ORDER — DONEPEZIL HCL 5 MG PO TABS
10.0000 mg | ORAL_TABLET | Freq: Every day | ORAL | Status: DC
  Administered 2015-05-17 – 2015-05-20 (×4): 10 mg via ORAL
  Filled 2015-05-17 (×4): qty 2

## 2015-05-17 MED ORDER — POTASSIUM CHLORIDE IN NACL 40-0.9 MEQ/L-% IV SOLN
INTRAVENOUS | Status: DC
  Administered 2015-05-17: 100 mL/h via INTRAVENOUS
  Filled 2015-05-17 (×4): qty 1000

## 2015-05-17 MED ORDER — ALBUTEROL SULFATE (2.5 MG/3ML) 0.083% IN NEBU: 3.0000 mL | INHALATION_SOLUTION | RESPIRATORY_TRACT | Status: DC | PRN

## 2015-05-17 MED ORDER — NICOTINE 7 MG/24HR TD PT24
7.0000 mg | MEDICATED_PATCH | Freq: Every day | TRANSDERMAL | Status: DC
  Administered 2015-05-17 – 2015-05-20 (×4): 7 mg via TRANSDERMAL
  Filled 2015-05-17 (×4): qty 1

## 2015-05-17 MED ORDER — HYDRALAZINE HCL 10 MG PO TABS
10.0000 mg | ORAL_TABLET | Freq: Three times a day (TID) | ORAL | Status: DC
  Administered 2015-05-17 – 2015-05-20 (×10): 10 mg via ORAL
  Filled 2015-05-17 (×10): qty 1

## 2015-05-17 MED ORDER — POTASSIUM CHLORIDE CRYS ER 20 MEQ PO TBCR: 40.0000 meq | EXTENDED_RELEASE_TABLET | Freq: Two times a day (BID) | ORAL | Status: DC

## 2015-05-17 MED ORDER — ACETAMINOPHEN 650 MG RE SUPP: 650.0000 mg | Freq: Four times a day (QID) | RECTAL | Status: DC | PRN

## 2015-05-17 MED ORDER — POTASSIUM CHLORIDE 20 MEQ PO PACK
40.0000 meq | PACK | Freq: Two times a day (BID) | ORAL | Status: DC
  Administered 2015-05-17: 40 meq via ORAL
  Filled 2015-05-17 (×2): qty 2

## 2015-05-17 MED ORDER — QUETIAPINE FUMARATE 100 MG PO TABS
200.0000 mg | ORAL_TABLET | Freq: Every day | ORAL | Status: DC
  Administered 2015-05-17 – 2015-05-19 (×3): 200 mg via ORAL
  Filled 2015-05-17 (×3): qty 2

## 2015-05-17 MED ORDER — ACETAMINOPHEN 325 MG PO TABS
650.0000 mg | ORAL_TABLET | Freq: Four times a day (QID) | ORAL | Status: DC | PRN
Start: 2015-05-17 — End: 2015-05-20

## 2015-05-17 MED ORDER — DILTIAZEM HCL ER COATED BEADS 180 MG PO CP24
180.0000 mg | ORAL_CAPSULE | Freq: Every day | ORAL | Status: DC
  Administered 2015-05-17 – 2015-05-20 (×4): 180 mg via ORAL
  Filled 2015-05-17 (×4): qty 1

## 2015-05-17 MED ORDER — TRAZODONE HCL 50 MG PO TABS
50.0000 mg | ORAL_TABLET | Freq: Every day | ORAL | Status: DC
  Administered 2015-05-17 – 2015-05-19 (×3): 50 mg via ORAL
  Filled 2015-05-17 (×3): qty 1

## 2015-05-17 MED ORDER — CIPROFLOXACIN HCL 500 MG PO TABS: 500.0000 mg | ORAL_TABLET | Freq: Two times a day (BID) | ORAL | Status: DC

## 2015-05-17 MED ORDER — ONDANSETRON HCL 4 MG PO TABS
4.0000 mg | ORAL_TABLET | Freq: Four times a day (QID) | ORAL | Status: DC | PRN
  Administered 2015-05-19: 4 mg via ORAL
  Filled 2015-05-17 (×3): qty 1

## 2015-05-17 MED ORDER — SODIUM CHLORIDE 0.9 % IJ SOLN
3.0000 mL | Freq: Two times a day (BID) | INTRAMUSCULAR | Status: DC
  Administered 2015-05-17 – 2015-05-19 (×2): 3 mL via INTRAVENOUS

## 2015-05-17 MED ORDER — DOCUSATE SODIUM 100 MG PO CAPS
100.0000 mg | ORAL_CAPSULE | Freq: Two times a day (BID) | ORAL | Status: DC
  Administered 2015-05-17 – 2015-05-20 (×7): 100 mg via ORAL
  Filled 2015-05-17 (×7): qty 1

## 2015-05-17 MED ORDER — CIPROFLOXACIN HCL 500 MG PO TABS
500.0000 mg | ORAL_TABLET | Freq: Every day | ORAL | Status: DC
  Administered 2015-05-17 – 2015-05-20 (×4): 500 mg via ORAL
  Filled 2015-05-17 (×4): qty 1

## 2015-05-17 MED ORDER — BISMUTH SUBSALICYLATE 262 MG/15ML PO SUSP: 5.0000 mL | Freq: Four times a day (QID) | ORAL | Status: DC | PRN

## 2015-05-17 MED ORDER — ONDANSETRON HCL 4 MG/2ML IJ SOLN
4.0000 mg | Freq: Four times a day (QID) | INTRAMUSCULAR | Status: DC | PRN
  Administered 2015-05-17 (×2): 4 mg via INTRAVENOUS
  Filled 2015-05-17 (×2): qty 2

## 2015-05-17 MED ORDER — SALINE SPRAY 0.65 % NA SOLN: 2.0000 | Freq: Every day | NASAL | Status: DC | PRN

## 2015-05-17 MED ORDER — MOMETASONE FURO-FORMOTEROL FUM 100-5 MCG/ACT IN AERO
2.0000 | INHALATION_SPRAY | Freq: Two times a day (BID) | RESPIRATORY_TRACT | Status: DC
  Administered 2015-05-17 – 2015-05-20 (×7): 2 via RESPIRATORY_TRACT
  Filled 2015-05-17: qty 8.8

## 2015-05-17 MED ORDER — PYRIDOSTIGMINE BROMIDE 60 MG PO TABS
60.0000 mg | ORAL_TABLET | Freq: Three times a day (TID) | ORAL | Status: DC
  Administered 2015-05-17 – 2015-05-20 (×10): 60 mg via ORAL
  Filled 2015-05-17 (×10): qty 1

## 2015-05-17 MED ORDER — HYDROCOD POLST-CPM POLST ER 10-8 MG/5ML PO SUER: 5.0000 mL | Freq: Two times a day (BID) | ORAL | Status: DC | PRN

## 2015-05-17 MED ORDER — PHENYLEPH-SHARK LIV OIL-MO-PET 0.25-3-14-71.9 % RE OINT: 1.0000 "application " | TOPICAL_OINTMENT | Freq: Two times a day (BID) | RECTAL | Status: DC | PRN

## 2015-05-17 MED ORDER — POLYETHYLENE GLYCOL 3350 17 G PO PACK
17.0000 g | PACK | Freq: Every day | ORAL | Status: DC | PRN
  Administered 2015-05-18 – 2015-05-19 (×2): 17 g via ORAL
  Filled 2015-05-17 (×2): qty 1

## 2015-05-17 MED ORDER — DEXTROMETHORPHAN-BENZOCAINE 5-7.5 MG MT LOZG: LOZENGE | Freq: Four times a day (QID) | OROMUCOSAL | Status: DC | PRN

## 2015-05-17 MED ORDER — HEPARIN SODIUM (PORCINE) 5000 UNIT/ML IJ SOLN
5000.0000 [IU] | Freq: Three times a day (TID) | INTRAMUSCULAR | Status: DC
  Administered 2015-05-17 – 2015-05-20 (×7): 5000 [IU] via SUBCUTANEOUS
  Filled 2015-05-17 (×9): qty 1

## 2015-05-17 MED ORDER — PREDNISONE 20 MG PO TABS
30.0000 mg | ORAL_TABLET | ORAL | Status: DC
  Administered 2015-05-17 – 2015-05-19 (×2): 30 mg via ORAL
  Filled 2015-05-17 (×3): qty 1

## 2015-05-17 MED ORDER — LAMOTRIGINE 100 MG PO TABS
100.0000 mg | ORAL_TABLET | Freq: Two times a day (BID) | ORAL | Status: DC
  Administered 2015-05-17 – 2015-05-20 (×7): 100 mg via ORAL
  Filled 2015-05-17 (×7): qty 1

## 2015-05-17 NOTE — Progress Notes (Addendum)
[facility] [place] NAME: Mackenzie Rose    MR#:  [id number]  DATE OF BIRTH:  1946-08-01  SUBJECTIVE:  Came in after syncopal episode. Been feeling thirsty lately. Feels better. Denies any cp or sob  REVIEW OF SYSTEMS:   Review of Systems  Constitutional: Positive for malaise/fatigue. Negative for fever, chills and weight loss.  HENT: Negative for ear discharge, ear pain and nosebleeds.   Eyes: Negative for blurred vision, pain and discharge.  Respiratory: Negative for sputum production, shortness of breath, wheezing and stridor.   Cardiovascular: Negative for chest pain, palpitations, orthopnea and PND.  Gastrointestinal: Negative for nausea, vomiting, abdominal pain and diarrhea.  Genitourinary: Negative for urgency and frequency.  Musculoskeletal: Negative for back pain and joint pain.  Neurological: Positive for weakness. Negative for sensory change, speech change, focal weakness and loss of consciousness.  Psychiatric/Behavioral: Negative for depression and hallucinations. The patient is not nervous/anxious.   All other systems reviewed and are negative.  Tolerating Diet:yes Tolerating PT: pending  DRUG ALLERGIES:   Allergies  Allergen Reactions  . Lactose Intolerance (Gi) Other (See Comments)    Reaction:  GI upset   . Lithium Other (See Comments)    Reaction:  Unknown   . Penicillins Rash    VITALS:  Blood pressure 142/66, pulse 88, temperature 98.8 F (37.1 C), temperature source Oral, resp. rate 18, height 5\' 4"  (1.626 m), weight 57.425 kg (126 lb 9.6 oz), SpO2 96 %.  PHYSICAL EXAMINATION:   Physical Exam  GENERAL:  69 y.o.-year-old patient lying in the bed with no acute distress.  EYES: Pupils equal, round, reactive to light and accommodation. No scleral icterus. Extraocular muscles intact.  HEENT: Head atraumatic, normocephalic. Oropharynx and nasopharynx clear. edentulous NECK:  Supple, no jugular venous  distention. No thyroid enlargement, no tenderness.  LUNGS: Normal breath sounds bilaterally, no wheezing, rales, rhonchi. No use of accessory muscles of respiration.  CARDIOVASCULAR: S1, S2 normal. No murmurs, rubs, or gallops.  ABDOMEN: Soft, nontender, nondistended. Bowel sounds present. No organomegaly or mass.  EXTREMITIES: No cyanosis, clubbing or edema b/l.    NEUROLOGIC: Cranial nerves II through XII are intact. No focal Motor or sensory deficits b/l.   PSYCHIATRIC:  patient is alert and oriented x 3.  SKIN: No obvious rash, lesion, or ulcer.   LABORATORY PANEL:   CBC  Recent Labs Lab 05/16/15 2323  WBC 7.1  HGB 8.6*  HCT 29.1*  PLT 212   Chemistries   Recent Labs Lab 05/16/15 2323 05/17/15 1035  NA 137  --   K 2.6* 3.5  CL 101  --   CO2 24  --   GLUCOSE 234*  --   BUN 18  --   CREATININE 1.77*  --   CALCIUM 8.4*  --   AST 32  --   ALT 16  --   ALKPHOS 84  --   BILITOT 0.2*  --     Cardiac Enzymes  Recent Labs Lab 05/17/15 1035  TROPONINI 0.08*    RADIOLOGY:  Ct Head Wo Contrast  05/17/2015   CLINICAL DATA:  Patient fell striking back of head on a hand rail. No loss of consciousness.  EXAM: CT HEAD WITHOUT CONTRAST  TECHNIQUE: Contiguous axial images were obtained from the base of the skull through the vertex without intravenous contrast.  COMPARISON:  01/31/2014  FINDINGS: Mild diffuse cerebral atrophy. No ventricular dilatation. Patchy low-attenuation changes in the deep white matter consistent  with small vessel ischemia. Focal areas of encephalomalacia in the right frontal and left posterior parietal regions are unchanged since prior study and probably represent old infarcts. No mass effect or midline shift. No abnormal extra-axial fluid collections. Gray-white matter junctions are distinct. Basal cisterns are not effaced. No evidence of acute intracranial hemorrhage. No depressed skull fractures. Mucosal thickening in the maxillary antra. Mastoid air cells  are not opacified. Subcutaneous emphysema in the scalp over the left posterior parietal/ occipital region.  IMPRESSION: No acute intracranial abnormalities. Chronic atrophy and small vessel ischemic changes. 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 Subcutaneous scalp laceration over the left occipital/ parietal region.   Electronically Signed   By: Lucienne Capers M.D.   On: 05/17/2015 00:13     ASSESSMENT AND PLAN:   69 year old female with past history significant for near syncope and myasthenia gravis admitted for syncope and elevated troponin. 1. Syncope: Despite the patient denying loss of consciousness is unclear if she had any control over her fall as she has sustained a recent medial laceration to her occiput indicating that she had not protected her head during the fall. sHe denies any chest pain or palpitations.  -Generalized weakness secondary to ARF (baseline creatinine 1.03) came in with creat of 2.02.. improving with IVF. -laceration s/p staples in the ER.    2. Myasthenia gravis: Continue pyridostigmine and prednisone  3. Urinary tract infection: Possible contributing factor to weakness; she has been taking Cipro which we will continue while hospitalized.  4. Hypokalemia: Replete orally and an intravenous fluid. May also contribute to weakness and tendency to fall k 2.6-->3.5  5. Atrial fibrillation: No indication of rapid ventricular rate or tachyarrhythmia. Continue no anticoagulation due to fall risk; continue diltiazem CR -mild elevated troponin w/o h/o CAD. EKG no acute changes. Could be some due to elevated creat also.  6. Hypertension: Controlled; amlodipine and Coreg discontinued since last admission. Continue hydralazine. 7. COPD: Continue inhaled corticosteroid.   8. Dementia: Continue Aricept, Lamictal and sleep aids at night   9. DVT prophylaxis: Heparin   patient is full code.   PT to see. If remains stable will d[facility]  Case discussed with Care Management/Social Worker. Management plans discussed with the patient, family and they are in agreement.   TOTAL TIME TAKING CARE OF THIS PATIENT: 30 minutes.  >50% time spent on counselling and coordination of care  POSSIBLE D/C IN 1-2 DAYS, DEPENDING ON CLINICAL CONDITION.   Aslee Such M.D on 05/17/2015 at 1:56 PM  Between 7am to 6pm - Pager - [phone]  After 6pm go to [email or web address] - password [email or web address] [facility]  Office  [phone]  CC: Primary care physician; Lavera Guise, MD

## 2015-05-17 NOTE — Care Management (Signed)
Attending notified CM that anticipate discharge 9/9 back to family care home.  Notified CSW.  Contacted by [place].  Patient is currently receiving home health nursing and physical therapy.  Will require resumption of care orders.  Updated attending of need for these orders

## 2015-05-17 NOTE — Care Management Note (Signed)
Case Management Note  Patient Details  Name: Mackenzie Rose MRN: [id number] Date of Birth: 06-21-1946  Subjective/Objective:  This  Threasa Beards is very articulate, and is able to talk about her health issues, and the current episode. She is here today because she had a fall, where she struck her head.She had an elevated trop-0.05 initially. The next 2 were also elevated at 0.06 and 0.08.  It was discovered she was orthostatic, with systolic BP dropping from 570 systolic, to 177 when standing.  She states she uses a circular walker at the [facility]. She gets all meds as needed. She is able to feed and wash herself once set up by staff.She also toilets with assist. She is very concerned about becoming wheelchair bound with her Myasthenia Gravis as times progresses.She has had regular followups for this.  She is able to verbalize the medical plan for her is to continue IV fluids and possibly D/C tomorrow.                  Action/Plan:   Expected Discharge Date:                  Expected Discharge Plan:     In-House Referral:     Discharge planning Services     Post Acute Care Choice:    Choice offered to:     DME Arranged:    DME Agency:     HH Arranged:    [place] Agency:     Status of Service:     Medicare Important Message Given:  Yes-second notification given Date Medicare IM Given:    Medicare IM give by:    Date Additional Medicare IM Given:    Additional Medicare Important Message give by:     If discussed at [place] of Stay Meetings, dates discussed:    Additional Comments:  Beau Fanny, RN 05/17/2015, 1:47 PM

## 2015-05-17 NOTE — Progress Notes (Signed)
Patient alert and oriented x4, no complaints at this time. vss at this time. Patient NSR on telemetry. Will continue to assess. Lurene Robley R [place]  

## 2015-05-17 NOTE — Clinical Social Work Note (Signed)
Clinical Social Work Assessment  Patient Details  Name: Mackenzie Rose MRN: [id number] Date of Birth: Aug 30, 1946  Date of referral:  05/17/15               Reason for consult:  Facility Placement (pt is from ALF)                Permission sought to share information with:  Chartered certified accountant granted to share information::  Yes, Verbal Permission Granted  Name::     [name]  Agency::  [facility]  Relationship::     Contact Information:     Housing/Transportation Living arrangements for the past 2 months:  [place] of Information:  Patient, Facility, Medical Team Patient Interpreter Needed:  None Criminal Activity/Legal Involvement Pertinent to Current Situation/Hospitalization:  No - Comment as needed Significant Relationships:  Siblings Lives with:  Facility Resident Do you feel safe going back to the place where you live?  Yes Need for family participation in patient care:  No (Coment)  Care giving concerns:  No concerns voiced at this time by pt.     Social Worker assessment / plan:  CSW spoke to pt.  She is in agreement with returning to ALF upon DC.  CSW also verified that pt would be able to return to ALF.  Per Elvis Coil at facility pt can return once she is medically stable.  Previous level of ambulation was rolling walker sometimes, but mostly independent walking.    Employment status:  Disabled (Comment on whether or not currently receiving Disability), Retired Forensic scientist:  Medicare PT Recommendations:    Information / Referral to community resources:     Patient/Family's Response to care:  Pt thanked CSW for speaking with her about DC plan   Patient/Family's Understanding of and Emotional Response to Diagnosis, Current Treatment, and Prognosis:  Pt verbalized her understanding of DC plan and was in agreement.  Emotional Assessment Appearance:  Appears stated age Attitude/Demeanor/Rapport:    (playful) Affect (typically observed):  Appropriate Orientation:  Oriented to Self, Oriented to Place, Oriented to  Time, Oriented to Situation Alcohol / Substance use:  Never Used Psych involvement (Current and /or in the community):  No (Comment)  Discharge Needs  Concerns to be addressed:  Care Coordination Readmission within the last 30 days:  No Current discharge risk:  None Barriers to Discharge:  No Barriers Identified   Mathews Argyle, LCSW 05/17/2015, 12:16 PM

## 2015-05-17 NOTE — H&P (Signed)
Mackenzie Rose is an 69 y.o. female.   Chief Complaint: Fall HPI: The patient presents emergency department via EMS after suffering a fall at her nursing home. The patient states that she had been inside but felt chills and shaking which she thought may improve if she went into the warm air outside. She states that she felt herself become weak and lost her balance but she denies loss of consciousness. However she hit her head and sustained a significant laceration to the posterior aspect of her scalp. She denies any associated chest pain, shortness of breath, nausea, vomiting or diaphoresis. In the emergency department the patient was found to have a slight elevation in her troponin bumped to the emergency department staff to call for admission.  Past Medical History  Diagnosis Date  . Heart disease   . Atrial fibrillation     a. reported a-fib; b. unknown chronicity; c. dates back to 1970s; d. not on long term anticoagulation 2/2 no documented a-fib  . Hypertension   . High cholesterol   . Diabetes     borderline  . COPD (chronic obstructive pulmonary disease)   . Melanoma   . Kidney disorder   . Tremor   . Autoimmune disease   . Depressed   . Anxiety disorder   . Migraine   . Dementia   . Motion sickness   . Myasthenia gravis   . Bipolar disorder   . Dementia   . Acute renal failure   . Near syncope     Past Surgical History  Procedure Laterality Date  . Tubal ligation    . Retena repair    . Cataract extraction    . Cholecystectomy      Family History  Problem Relation Age of Onset  . Heart disease Father   . Other Mother     ruptured hernia  . Breast cancer Sister   . Heart disease Sister   . Diabetes Mellitus II Sister     x2   Social History:  reports that she has been smoking Cigarettes.  She started smoking about 50 years ago. She has a 100 pack-year smoking history. She has never used smokeless tobacco. She reports that she does not drink alcohol or use  illicit drugs.  Allergies:  Allergies  Allergen Reactions  . Lactose Intolerance (Gi) Other (See Comments)    Reaction:  GI upset   . Lithium Other (See Comments)    Reaction:  Unknown   . Penicillins Rash    Prior to Admission medications   Medication Sig Start Date End Date Taking? Authorizing Provider  acetaminophen (TYLENOL) 650 MG CR tablet Take 650 mg by mouth daily. Pt takes daily at 2pm and every four hours as needed for pain.   Yes Historical Provider, MD  busPIRone (BUSPAR) 10 MG tablet Take 10 mg by mouth 2 (two) times daily.  04/23/15  Yes Historical Provider, MD  ciprofloxacin (CIPRO) 500 MG tablet Take 500 mg by mouth 2 (two) times daily. For 14 days   Yes Historical Provider, MD  diclofenac sodium (VOLTAREN) 1 % GEL Apply 2 g topically 2 (two) times daily.    Yes Historical Provider, MD  diltiazem (CARDIZEM CD) 180 MG 24 hr capsule Take 180 mg by mouth daily.    Yes Historical Provider, MD  donepezil (ARICEPT) 10 MG tablet Take 1 tablet (10 mg total) by mouth daily. 03/29/15  Yes Rainey Pines, MD  Fluticasone-Salmeterol (ADVAIR) 250-50 MCG/DOSE AEPB Inhale 1 puff into the  lungs 2 (two) times daily.   Yes Historical Provider, MD  hydrALAZINE (APRESOLINE) 10 MG tablet Take 10 mg by mouth 3 (three) times daily.   Yes Historical Provider, MD  lamoTRIgine (LAMICTAL) 100 MG tablet Take 100 mg by mouth 2 (two) times daily.   Yes Historical Provider, MD  latanoprost (XALATAN) 0.005 % ophthalmic solution Place 1 drop into both eyes at bedtime.    Yes Historical Provider, MD  nicotine (NICODERM CQ - DOSED IN MG/24 HR) 7 mg/24hr patch Place 1 patch (7 mg total) onto the skin daily. 03/25/15  Yes Srikar Sudini, MD  predniSONE (DELTASONE) 10 MG tablet Take 30 mg by mouth every other day.   Yes Historical Provider, MD  pyridostigmine (MESTINON) 60 MG tablet Take 60 mg by mouth 3 (three) times daily.    Yes Historical Provider, MD  QUEtiapine (SEROQUEL) 200 MG tablet Take 1 tablet (200 mg  total) by mouth at bedtime. 03/29/15  Yes Rainey Pines, MD  traMADol (ULTRAM) 50 MG tablet Take 50 mg by mouth 2 (two) times daily.   Yes Historical Provider, MD  traZODone (DESYREL) 50 MG tablet Take 1 tablet (50 mg total) by mouth at bedtime. Take 1/2 at bedtime. Patient taking differently: Take 50 mg by mouth at bedtime.  03/29/15  Yes Rainey Pines, MD  albuterol (PROVENTIL HFA;VENTOLIN HFA) 108 (90 BASE) MCG/ACT inhaler Inhale 2 puffs into the lungs every 4 (four) hours as needed for wheezing or shortness of breath. 03/25/15   Srikar Sudini, MD  bismuth subsalicylate (PEPTO BISMOL) 262 MG/15ML suspension Take 5 mLs by mouth 4 (four) times daily as needed for diarrhea or loose stools (or nausea).    Historical Provider, MD  chlorpheniramine-HYDROcodone (TUSSIONEX) 10-8 MG/5ML SUER Take 5 mLs by mouth every 12 (twelve) hours as needed for cough. Patient not taking: Reported on 05/16/2015 03/25/15   Hillary Bow, MD  Dextromethorphan-Benzocaine (SORE THROAT & COUGH LOZENGES MT) Use as directed 1 lozenge in the mouth or throat 4 (four) times daily as needed (for cough).    Historical Provider, MD  guaifenesin (ROBITUSSIN) 100 MG/5ML syrup Take 100 mg by mouth 2 (two) times daily as needed for cough.    Historical Provider, MD  phenylephrine-shark liver oil-mineral oil-petrolatum (PREPARATION H) 0.25-3-14-71.9 % rectal ointment Place 1 application rectally 2 (two) times daily as needed for hemorrhoids.    Historical Provider, MD  polyethylene glycol (MIRALAX / GLYCOLAX) packet Take 17 g by mouth daily as needed for mild constipation.    Historical Provider, MD  sodium chloride (OCEAN) 0.65 % SOLN nasal spray Place 2 sprays into both nostrils daily as needed for congestion.     Historical Provider, MD     Results for orders placed or performed during the hospital encounter of 05/16/15 (from the past 48 hour(s))  CBC     Status: Abnormal   Collection Time: 05/16/15 11:23 PM  Result Value Ref Range   WBC  7.1 3.6 - 11.0 K/uL   RBC 4.18 3.80 - 5.20 MIL/uL   Hemoglobin 8.6 (L) 12.0 - 16.0 g/dL    Comment: RESULT REPEATED AND VERIFIED   HCT 29.1 (L) 35.0 - 47.0 %   MCV 69.6 (L) 80.0 - 100.0 fL   MCH 20.5 (L) 26.0 - 34.0 pg   MCHC 29.5 (L) 32.0 - 36.0 g/dL   RDW 19.6 (H) 11.5 - 14.5 %   Platelets 212 150 - 440 K/uL  Comprehensive metabolic panel     Status: Abnormal  Collection Time: 05/16/15 11:23 PM  Result Value Ref Range   Sodium 137 135 - 145 mmol/L   Potassium 2.6 (LL) 3.5 - 5.1 mmol/L    Comment: CRITICAL RESULT CALLED TO, READ BACK BY AND VERIFIED WITH NELL MONAR 05/17/2015 0242 [facility]   Chloride 101 101 - 111 mmol/L   CO2 24 22 - 32 mmol/L   Glucose, Bld 234 (H) 65 - 99 mg/dL   BUN 18 6 - 20 mg/dL   Creatinine, Ser 1.77 (H) 0.44 - 1.00 mg/dL   Calcium 8.4 (L) 8.9 - 10.3 mg/dL   Total Protein 5.2 (L) 6.5 - 8.1 g/dL   Albumin 2.9 (L) 3.5 - 5.0 g/dL   AST 32 15 - 41 U/L   ALT 16 14 - 54 U/L   Alkaline Phosphatase 84 38 - 126 U/L   Total Bilirubin 0.2 (L) 0.3 - 1.2 mg/dL   GFR calc non Af Amer 28 (L) >60 mL/min   GFR calc Af Amer 32 (L) >60 mL/min    Comment: (NOTE) The eGFR has been calculated using the CKD EPI equation. This calculation has not been validated in all clinical situations. eGFR's persistently <60 mL/min signify possible Chronic Kidney Disease.    Anion gap 12 5 - 15  Troponin I     Status: Abnormal   Collection Time: 05/16/15 11:23 PM  Result Value Ref Range   Troponin I 0.05 (H) <0.031 ng/mL    Comment: READ BACK AND VERIFIED NELL MONAR 05/17/2015 0242 [facility]        PERSISTENTLY INCREASED TROPONIN VALUES IN THE RANGE OF 0.04-0.49 ng/mL CAN BE SEEN IN:       -UNSTABLE ANGINA       -CONGESTIVE HEART FAILURE       -MYOCARDITIS       -CHEST TRAUMA       -ARRYHTHMIAS       -LATE PRESENTING MYOCARDIAL INFARCTION       -COPD   CLINICAL FOLLOW-UP RECOMMENDED.    Ct Head Wo Contrast  05/17/2015   CLINICAL DATA:  Patient fell striking back of head on a hand  rail. No loss of consciousness.  EXAM: CT HEAD WITHOUT CONTRAST  TECHNIQUE: Contiguous axial images were obtained from the base of the skull through the vertex without intravenous contrast.  COMPARISON:  01/31/2014  FINDINGS: Mild diffuse cerebral atrophy. No ventricular dilatation. Patchy low-attenuation changes in the deep white matter consistent with small vessel ischemia. Focal areas of encephalomalacia in the right frontal and left posterior parietal regions are unchanged since prior study and probably represent old infarcts. No mass effect or midline shift. No abnormal extra-axial fluid collections. Gray-white matter junctions are distinct. Basal cisterns are not effaced. No evidence of acute intracranial hemorrhage. No depressed skull fractures. Mucosal thickening in the maxillary antra. Mastoid air cells are not opacified. Subcutaneous emphysema in the scalp over the left posterior parietal/ occipital region.  IMPRESSION: No acute intracranial abnormalities. Chronic atrophy and small vessel ischemic changes. 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 Subcutaneous scalp laceration over the left occipital/ parietal region.   Electronically Signed   By: Lucienne Capers M.D.   On: 05/17/2015 00:13    Review of Systems  Constitutional: Negative for fever and chills.  HENT: Negative for sore throat and tinnitus.   Eyes: Negative for blurred vision and redness.  Respiratory: Negative for cough and shortness of breath.   Cardiovascular: Negative for chest pain, palpitations, orthopnea and PND.  Gastrointestinal:  Negative for nausea, vomiting, abdominal pain and diarrhea.  Genitourinary: Negative for dysuria, urgency and frequency.  Musculoskeletal: Negative for myalgias and joint pain.  Skin: Negative for rash.       No lesions  Neurological: Negative for speech change, focal weakness and weakness.  Endo/Heme/Allergies: Does not bruise/bleed easily.       No temperature  intolerance  Psychiatric/Behavioral: Negative for depression and suicidal ideas.    Blood pressure 146/87, pulse 92, temperature 98.2 F (36.8 C), temperature source Oral, resp. rate 16, height 5' 4"  (1.626 m), weight 61.236 kg (135 lb), SpO2 100 %. Physical Exam  Vitals reviewed. Constitutional: She is oriented to person, place, and time. She appears well-developed and well-nourished. No distress.  HENT:  Head: Normocephalic and atraumatic.  Mouth/Throat: Oropharynx is clear and moist.  Eyes: Conjunctivae and EOM are normal. Pupils are equal, round, and reactive to light.  Neck: Normal range of motion. No JVD present. No tracheal deviation present. No thyromegaly present.  Cardiovascular: Normal rate, regular rhythm and normal heart sounds.  Exam reveals no gallop and no friction rub.   No murmur heard. Respiratory: Effort normal and breath sounds normal.  GI: Soft. Bowel sounds are normal. She exhibits no distension. There is no tenderness.  Genitourinary:  Deferred  Musculoskeletal: Normal range of motion. She exhibits no edema.  Lymphadenopathy:    She has no cervical adenopathy.  Neurological: She is alert and oriented to person, place, and time. No cranial nerve deficit. She exhibits normal muscle tone.  Skin: Skin is warm and dry.  Psychiatric: She has a normal mood and affect. Judgment and thought content normal.     Assessment/Plan This is a 69 year old female with past history significant for near syncope and myasthenia gravis admitted for syncope and elevated troponin. 1. Syncope: Despite the patient denying loss of consciousness is unclear if she had any control over her fall as she has sustained a recent medial laceration to her occiput indicating that she had not protected her head during the fall. He denies any chest pain or palpitations. Generalized weakness secondary to myasthenia gravis as well as hypokalemia be the etiology of her falls. Nonetheless I will observe the  patient for tachyarrhythmias or upper trending cardiac enzymes that could have precipitated syncope. 2. Myasthenia gravis: Continue pyridostigmine and prednisone 3. Urinary tract infection: Possible contributing factor to weakness; she has been taking Cipro which we will continue while hospitalized. 4. Hypokalemia: Replete orally and an intravenous fluid. May also contribute to weakness and tendency to fall 5. Atrial fibrillation: No indication of rapid ventricular rate or tachyarrhythmia. Continue no anticoagulation due to fall risk; continue diltiazem CR 6. Hypertension: Controlled; amlodipine and Coreg discontinued since last admission. Continue hydralazine. Low concern for orthostasis 7. COPD: Continue inhaled corticosteroid. The patient is also taking a lot of cough medicine which may contribute to lightheadedness. I have discontinued some of her antitussive agents 8. Chronic kidney disease: Stage IV; marginal improvement since previous admission. Continue intravenous fluid and avoid nephrotoxic agents (discontinued tramadol and Voltaren gel) 9. Dementia: Continue Aricept, Lamictal and sleep aids at night  10. DVT prophylaxis: Heparin 11. GI prophylaxis: None The patient is full code. Time spent on admission orders and patient care approximately 35 minutes   Harrie Foreman 05/17/2015, 3:36 AM

## 2015-05-17 NOTE — Care Management Important Message (Signed)
Important Message  Patient Details  Name: Mackenzie Rose MRN: [id number] Date of Birth: 06-14-1946   Medicare Important Message Given:  Yes-second notification given    [name] 05/17/2015, 10:45 AM

## 2015-05-18 MED ORDER — CIPROFLOXACIN HCL 500 MG PO TABS: 500.0000 mg | ORAL_TABLET | Freq: Two times a day (BID) | ORAL | Status: DC

## 2015-05-18 MED ORDER — ONDANSETRON HCL 4 MG PO TABS: 4.0000 mg | ORAL_TABLET | Freq: Three times a day (TID) | ORAL | Status: AC | PRN

## 2015-05-18 NOTE — Care Management (Signed)
Spoke with Fawn Kirk at [facility].  Even though it is not showing that the medical record has been received on line, [facility] confirms agency's medical record department has received the medical record.

## 2015-05-18 NOTE — Discharge Instructions (Signed)
Get your staples removed in 1 week with your PCP

## 2015-05-18 NOTE — Clinical Social Work Note (Addendum)
CSW notified facility [facility], RN, and pt that pt would return to ALF via facility transport.  CSW signing off unless further needs arise

## 2015-05-18 NOTE — Evaluation (Signed)
Physical Therapy Evaluation Patient Details Name: Mackenzie Rose MRN: [id number] DOB: 1945-12-31 Today's Date: 05/18/2015   History of Present Illness  The patient presents emergency department via EMS after suffering a fall at her nursing home. The patient states that she had been inside but felt chills and shaking which she thought may improve if she went into the warm air outside. She states that she felt herself become weak and lost her balance but she denies loss of consciousness. However she hit her head and sustained a significant laceration to the posterior aspect of her scalp. She denies any associated chest pain, shortness of breath, nausea, vomiting or diaphoresis. In the emergency department the patient was found to have a slight elevation in her troponin. Pt reports she feels extremely dizzy today and she doesn't know if she can do anything. Pt states "I haven't walked in a month" regarding her prior functional status but later in the conversation reports walking around the facility and to the bathroom. History of multiple falls (3 in the last 12 months). Pt reports being very frustrated about her facility stating, "there are never enough people to help me." However when offered list of other private pay facilities she could investigate pt reports she is really hoping to go back to [facility].   Clinical Impression  Pt is primarily self-limiting during PT evaluation. She initially reports she is "too dizzy" to participate with evaluation but eventually agrees.  Pt states "I haven't walked in a month" regarding her prior functional status but later in the conversation reports walking around the facility and to the bathroom. Pt demonstrates functional strength and balance for reasonably safe transfers and ambulation. She even walks without walker for short distance without loss of balance. Pt has history of multiple falls as well myasthenia gravis and would benefit from [facility] PT to work on strength,  safety with mobility, and strengthening to reduce risk of future falls and readmission. Pt will benefit from skilled PT services to address deficits in strength, balance, and mobility in order to return to full function at home.      Follow Up Recommendations Home health PT (At ALF)    Equipment Recommendations  None recommended by PT    Recommendations for Other Services       Precautions / Restrictions Precautions Precautions: Fall Restrictions Weight Bearing Restrictions: No      Mobility  Bed Mobility Overal bed mobility: Needs Assistance Bed Mobility: Sit to Supine;Supine to Sit     Supine to sit: Min assist Sit to supine: Independent   General bed mobility comments: HOB flat, no bed rails. Pt requests assist but appears to possess ability to perform independently given strength demonstrated when returning to bed  Transfers Overall transfer level: Modified independent Equipment used: Standard walker Transfers: Sit to/from Stand Sit to Stand: Modified independent (Device/Increase time)         General transfer comment: Pt demonstrates good LE power in coming to standing. She is very steady in standing without assist for balance.   Ambulation/Gait Ambulation/Gait assistance: Min guard Ambulation Distance (Feet): 30 Feet Assistive device: Standard walker Gait Pattern/deviations: Decreased step length - right;Decreased step length - left     General Gait Details: Pt initially reports she is "too dizzy" to participate with therapy. Once upright pt demonstrates adequate stability and strength to safely ambulate. She is able to ambulate with standard walker to door and back to bed. For a portion of the distance pt holds walker off ground  and when nearing bed upon return she leaves the walker behind her and walks the last 3-4 steps without an assistive device. Pt demonstrates mild slowing of speed but overall functional for facility ambulation.  Stairs             Wheelchair Mobility    Modified Rankin (Stroke Patients Only)       Balance Overall balance assessment: Needs assistance   Sitting balance-Leahy Scale: Good       Standing balance-Leahy Scale: Fair                 High Level Balance Comments: Pt quickly gets back in bed after ambulation stating she feels very dizzy. Unwilling to participate further with balance assessment.              Pertinent Vitals/Pain Pain Assessment: No/denies pain    Home Living Family/patient expects to be discharged to:: Assisted living               Home Equipment:  ("circular rolling walker," no BSC)      Prior Function Level of Independence: Needs assistance   Gait / Transfers Assistance Needed: No assistance  ADL's / Homemaking Assistance Needed: Assist with bathing        Hand Dominance        Extremity/Trunk Assessment   Upper Extremity Assessment: Generalized weakness           Lower Extremity Assessment: Generalized weakness (Pt will not allow therapist to finish MMT)      Cervical / Trunk Assessment: Kyphotic  Communication   Communication: No difficulties  Cognition Arousal/Alertness: Awake/alert Behavior During Therapy: Impulsive Overall Cognitive Status: No family/caregiver present to determine baseline cognitive functioning (AOx4)                      General Comments      Exercises        Assessment/Plan    PT Assessment Patient needs continued PT services  PT Diagnosis Difficulty walking;Generalized weakness   PT Problem List Decreased strength;Decreased activity tolerance;Decreased balance;Decreased safety awareness  PT Treatment Interventions DME instruction;Gait training;Stair training;Therapeutic activities;Therapeutic exercise;Balance training;Neuromuscular re-education   PT Goals (Current goals can be found in the Care Plan section) Acute Rehab PT Goals Patient Stated Goal: "I just can't go home right now, I'm too  dizzy" PT Goal Formulation: With patient Time For Goal Achievement: 06/01/15 Potential to Achieve Goals: Good    Frequency Min 2X/week   Barriers to discharge        Co-evaluation               End of Session Equipment Utilized During Treatment: Gait belt Activity Tolerance:  (Self-limited due to motivation) Patient left: in bed;with bed alarm set;with call bell/phone within reach Nurse Communication: Mobility status (CSW)         Time: 9622-2979 PT Time Calculation (min) (ACUTE ONLY): 17 min   Charges:   PT Evaluation $Initial PT Evaluation Tier I: 1 Procedure     PT G Codes:       Lyndel Safe Huprich PT, DPT   Huprich,Jason 05/18/2015, 11:01 AM

## 2015-05-18 NOTE — Care Management Important Message (Signed)
Important Message  Patient Details  Name: Mackenzie Rose MRN: [id number] Date of Birth: 07/10/1946   Medicare Important Message Given:  Yes-second notification given    Katrina Stack, RN 05/18/2015, 9:57 AM

## 2015-05-18 NOTE — Discharge Summary (Addendum)
[facility] [place] NAME: Mackenzie Rose    MR#:  [id number]  DATE OF BIRTH:  [birth year]  DATE OF ADMISSION:  05/16/2015 ADMITTING PHYSICIAN: Harrie Foreman, MD  DATE OF DISCHARGE: 05/20/15  PRIMARY CARE PHYSICIAN: Lavera Guise, MD    ADMISSION DIAGNOSIS:  Elevated troponin [R79.89] QT prolongation [I45.81] Syncope, unspecified syncope type [R55]  DISCHARGE DIAGNOSIS:  Syncope suspected due to dehydraiton and acute on chronic reanl failure-improved Mild elevated troponin w/o Cp or EKG changes UTI Chronic afib SECONDARY DIAGNOSIS:   Past Medical History  Diagnosis Date  . Heart disease   . Atrial fibrillation     a. reported a-fib; b. unknown chronicity; c. dates back to 1970s; d. not on long term anticoagulation 2/2 no documented a-fib  . Hypertension   . High cholesterol   . Diabetes     borderline  . COPD (chronic obstructive pulmonary disease)   . Melanoma   . Kidney disorder   . Tremor   . Autoimmune disease   . Depressed   . Anxiety disorder   . Migraine   . Dementia   . Motion sickness   . Myasthenia gravis   . Bipolar disorder   . Dementia   . Acute renal failure   . Near syncope     HOSPITAL COURSE:   69 year old female with past history significant for near syncope and myasthenia gravis admitted for syncope and elevated troponin.  1. Syncope: Despite the patient denying loss of consciousness is unclear if she had any control over her fall as she has sustained a recent medial laceration to her occiput indicating that she had not protected her head during the fall. sHe denies any chest pain or palpitations.  - Sounds like a vasovagal syncope -Generalized weakness secondary to ARF (baseline creatinine 1.03) came in with creat of 2.02.. improving with IVF. -laceration s/p staples in the ER. will need to get staples remeoved with PCP  2. Myasthenia gravis: Continue pyridostigmine and  prednisone -No acute changes.  3. Urinary tract infection: Continue ciprofloxacin  4. Hypokalemia: Repleted orally   5. Atrial fibrillation: No indication of rapid ventricular rate or tachyarrhythmia. Continue no anticoagulation due to fall risk; continue diltiazem CR -mild elevated troponin w/o h/o CAD. EKG no acute changes. Could be some due to elevated creat also.  6. Hypertension: Controlled; amlodipine and Coreg discontinued last admission. Continue hydralazine. Also on Cardizem  7. COPD: Continue inhaled corticosteroid.   8. Dementia: Continue Aricept, Lamictal and sleep aids at night    Patient had discharge orders for 05/19/2015 and got discharged early am of 05/20/2015 Routine labs done prior to discharge just resulted back. It shows the hemoglobin dropped to 5.9 whereas her baseline is around 8. Patient hasn't had any signs or symptoms of anemia. Her last hemoglobin 2 days ago was around 8. Partly dilutional and partly it could be and lab ever. [name] and left a message to recheck her hemoglobin tomorrow or get her to the ER if she is symptomatic. No active bleeding reported. Also discussed with care manager who would fax the hemoglobin check order to the home health agency.  DISCHARGE CONDITIONS:   fair  DRUG ALLERGIES:   Allergies  Allergen Reactions  . Lactose Intolerance (Gi) Other (See Comments)    Reaction:  GI upset   . Lithium Other (See Comments)    Reaction:  Unknown   . Penicillins Rash    DISCHARGE  MEDICATIONS:   Discharge Medication List as of 05/20/2015 10:02 AM    START taking these medications   Details  ondansetron (ZOFRAN) 4 MG tablet Take 1 tablet (4 mg total) by mouth every 8 (eight) hours as needed for nausea or vomiting., Starting 05/18/2015, Until Discontinued, Normal      CONTINUE these medications which have CHANGED   Details  ciprofloxacin (CIPRO) 500 MG tablet Take 1 tablet (500 mg total) by mouth 2 (two) times daily. For  14 days, Starting 05/18/2015, Until Discontinued, Normal      CONTINUE these medications which have NOT CHANGED   Details  acetaminophen (TYLENOL) 650 MG CR tablet Take 650 mg by mouth daily. Pt takes daily at 2pm and every four hours as needed for pain., Until Discontinued, Historical Med    busPIRone (BUSPAR) 10 MG tablet Take 10 mg by mouth 2 (two) times daily. , Starting 04/23/2015, Until Discontinued, Historical Med    diclofenac sodium (VOLTAREN) 1 % GEL Apply 2 g topically 2 (two) times daily. , Until Discontinued, Historical Med    diltiazem (CARDIZEM CD) 180 MG 24 hr capsule Take 180 mg by mouth daily. , Until Discontinued, Historical Med    donepezil (ARICEPT) 10 MG tablet Take 1 tablet (10 mg total) by mouth daily., Starting 03/29/2015, Until Discontinued, Normal    Fluticasone-Salmeterol (ADVAIR) 250-50 MCG/DOSE AEPB Inhale 1 puff into the lungs 2 (two) times daily., Until Discontinued, Historical Med    hydrALAZINE (APRESOLINE) 10 MG tablet Take 10 mg by mouth 3 (three) times daily., Until Discontinued, Historical Med    lamoTRIgine (LAMICTAL) 100 MG tablet Take 100 mg by mouth 2 (two) times daily., Until Discontinued, Historical Med    latanoprost (XALATAN) 0.005 % ophthalmic solution Place 1 drop into both eyes at bedtime. , Until Discontinued, Historical Med    nicotine (NICODERM CQ - DOSED IN MG/24 HR) 7 mg/24hr patch Place 1 patch (7 mg total) onto the skin daily., Starting 03/25/2015, Until Discontinued, Print    predniSONE (DELTASONE) 10 MG tablet Take 30 mg by mouth every other day., Until Discontinued, Historical Med    pyridostigmine (MESTINON) 60 MG tablet Take 60 mg by mouth 3 (three) times daily. , Until Discontinued, Historical Med    QUEtiapine (SEROQUEL) 200 MG tablet Take 1 tablet (200 mg total) by mouth at bedtime., Starting 03/29/2015, Until Discontinued, Normal    traMADol (ULTRAM) 50 MG tablet Take 50 mg by mouth 2 (two) times daily., Until Discontinued,  Historical Med    traZODone (DESYREL) 50 MG tablet Take 1 tablet (50 mg total) by mouth at bedtime. Take 1/2 at bedtime., Starting 03/29/2015, Until Discontinued, Normal    albuterol (PROVENTIL HFA;VENTOLIN HFA) 108 (90 BASE) MCG/ACT inhaler Inhale 2 puffs into the lungs every 4 (four) hours as needed for wheezing or shortness of breath., Starting 03/25/2015, Until Discontinued, Print    bismuth subsalicylate (PEPTO BISMOL) 262 MG/15ML suspension Take 5 mLs by mouth 4 (four) times daily as needed for diarrhea or loose stools (or nausea)., Until Discontinued, Historical Med    Dextromethorphan-Benzocaine (SORE THROAT & COUGH LOZENGES MT) Use as directed 1 lozenge in the mouth or throat 4 (four) times daily as needed (for cough)., Until Discontinued, Historical Med    guaifenesin (ROBITUSSIN) 100 MG/5ML syrup Take 100 mg by mouth 2 (two) times daily as needed for cough., Until Discontinued, Historical Med    phenylephrine-shark liver oil-mineral oil-petrolatum (PREPARATION H) 0.25-3-14-71.9 % rectal ointment Place 1 application rectally 2 (two) times daily  as needed for hemorrhoids., Until Discontinued, Historical Med    polyethylene glycol (MIRALAX / GLYCOLAX) packet Take 17 g by mouth daily as needed for mild constipation., Until Discontinued, Historical Med    sodium chloride (OCEAN) 0.65 % SOLN nasal spray Place 2 sprays into both nostrils daily as needed for congestion. , Until Discontinued, Historical Med      STOP taking these medications     chlorpheniramine-HYDROcodone (TUSSIONEX) 10-8 MG/5ML SUER         If you experience worsening of your admission symptoms, develop shortness of breath, life threatening emergency, suicidal or homicidal thoughts you must seek medical attention immediately by calling 911 or calling your MD immediately  if symptoms less severe.  You Must read complete instructions/literature along with all the possible adverse reactions/side effects for all the  Medicines you take and that have been prescribed to you. Take any new Medicines after you have completely understood and accept all the possible adverse reactions/side effects.   Please note  You were cared for by a hospitalist during your hospital stay. If you have any questions about your discharge medications or the care you received while you were in the hospital after you are discharged, you can call the unit and asked to speak with the hospitalist on call if the hospitalist that took care of you is not available. Once you are discharged, your primary care physician will handle any further medical issues. Please note that NO REFILLS for any discharge medications will be authorized once you are discharged, as it is imperative that you return to your primary care physician (or establish a relationship with a primary care physician if you do not have one) for your aftercare needs so that they can reassess your need for medications and monitor your lab values. Today   SUBJECTIVE   Feels better.   VITAL SIGNS:  Blood pressure 151/85, pulse 96, temperature 98.4 F (36.9 C), temperature source Oral, resp. rate 18, height 5\' 4"  (1.626 m), weight 59.739 kg (131 lb 11.2 oz), SpO2 96 %.  I/O:    Intake/Output Summary (Last 24 hours) at 05/20/15 1048 Last data filed at 05/20/15 0823  Gross per 24 hour  Intake    240 ml  Output   2400 ml  Net  -2160 ml    PHYSICAL EXAMINATION:  GENERAL:  69 y.o.-year-old patient lying in the bed with no acute distress.  EYES: Pupils equal, round, reactive to light and accommodation. No scleral icterus. Extraocular muscles intact. Poor visual acuity. HEENT: Head atraumatic, normocephalic. Oropharynx and nasopharynx clear. stapels + NECK:  Supple, no jugular venous distention. No thyroid enlargement, no tenderness.  LUNGS: Normal breath sounds bilaterally, no wheezing, rales,rhonchi or crepitation. No use of accessory muscles of respiration.  CARDIOVASCULAR:  S1, S2 normal. No rubs, or gallops. 3/6 systolic murmur present. ABDOMEN: Soft, non-tender, non-distended. Bowel sounds present. No organomegaly or mass.  EXTREMITIES: No pedal edema, cyanosis, or clubbing.  NEUROLOGIC: Cranial nerves II through XII are intact. Muscle strength 5/5 in all extremities. Sensation intact. Gait not checked.  PSYCHIATRIC: The patient is alert and oriented x 3.  SKIN: No obvious rash, lesion, or ulcer.   DATA REVIEW:   CBC   Recent Labs Lab 05/20/15 0721  WBC 8.9  HGB 5.9*  HCT 20.4*  PLT 161    Chemistries   Recent Labs Lab 05/16/15 2323  05/20/15 0721  NA 137  --  141  K 2.6*  < > 4.8  CL 101  --  108  CO2 24  --  30  GLUCOSE 234*  --  106*  BUN 18  --  27*  CREATININE 1.77*  --  1.40*  CALCIUM 8.4*  --  8.2*  AST 32  --   --   ALT 16  --   --   ALKPHOS 84  --   --   BILITOT 0.2*  --   --   < > = values in this interval not displayed.  Microbiology Results   No results found for this or any previous visit (from the past 240 hour(s)).  RADIOLOGY:  Ct Head Wo Contrast  05/17/2015   CLINICAL DATA:  Patient fell striking back of head on a hand rail. No loss of consciousness.  EXAM: CT HEAD WITHOUT CONTRAST  TECHNIQUE: Contiguous axial images were obtained from the base of the skull through the vertex without intravenous contrast.  COMPARISON:  01/31/2014  FINDINGS: Mild diffuse cerebral atrophy. No ventricular dilatation. Patchy low-attenuation changes in the deep white matter consistent with small vessel ischemia. Focal areas of encephalomalacia in the right frontal and left posterior parietal regions are unchanged since prior study and probably represent old infarcts. No mass effect or midline shift. No abnormal extra-axial fluid collections. Gray-white matter junctions are distinct. Basal cisterns are not effaced. No evidence of acute intracranial hemorrhage. No depressed skull fractures. Mucosal thickening in the maxillary antra. Mastoid air  cells are not opacified. Subcutaneous emphysema in the scalp over the left posterior parietal/ occipital region.  IMPRESSION: No acute intracranial abnormalities. Chronic atrophy and small vessel ischemic changes. 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 Subcutaneous scalp laceration over the left occipital/ parietal region.   Electronically Signed   By: Lucienne Capers M.D.   On: 05/17/2015 00:13     Management plans discussed with the patient, family and they are in agreement.  CODE STATUS:     Code Status Orders        Start     Ordered   05/17/15 0408  Full code   Continuous     05/17/15 0407      TOTAL TIME TAKING CARE OF THIS PATIENT: 40 minutes.    Gladstone Lighter M.D on 05/20/2015 at 10:47 AM  Between 7am to 6pm - Pager - [phone] After 6pm go to [email or web address] - password [email or web address] [facility]  Office  [phone]  CC: Primary care physician; Lavera Guise, MD

## 2015-05-18 NOTE — Care Management (Signed)
Patient is for discharge today back to her family care home with resumption of home health nursing and physical therapy through [facility].  Referral faxed via Epic.  Confirmed receipt

## 2015-05-18 NOTE — Care Management (Signed)
Patient verbalizes to CM that she does not feel well enough to discharge back to her family care home.  Says she is nauseated and having diarrhea and can not eat.   There is one stool documented since admission and no emesis. She has eaten 100% of her meals since 9/8/pm.    She wishes to appeal her discharge provided assistance.  Gave patient her detail notice of discharge.  Information being faxed to [facility]

## 2015-05-18 NOTE — Progress Notes (Signed)
Patient alert and oriented x4, no complaints at this time. vss at this time. Patient NSR on telemetry. Will continue to assess. Theodoro Koval R [place]  

## 2015-05-19 MED ORDER — MAGNESIUM HYDROXIDE 400 MG/5ML PO SUSP
30.0000 mL | Freq: Every day | ORAL | Status: DC
  Administered 2015-05-19: 30 mL via ORAL
  Filled 2015-05-19: qty 30

## 2015-05-19 NOTE — Progress Notes (Signed)
Pt's appeal has been denied as of late this afternoon. She ought to be discharged tomorrow. However, she has not had a bm for several days. Today i gave her miralax, colace and several glasses of prune juice without effect. Mom at h.s. Ordered.

## 2015-05-19 NOTE — Care Management Note (Signed)
Case Management Note  Patient Details  Name: Mackenzie Rose MRN: [id number] Date of Birth: 22-Feb-1946  Subjective/Objective:         Referral for home health PT and RN will be faxed to [facility] as soon as Medicare appeal is resolved.            Action/Plan:   Expected Discharge Date:                  Expected Discharge Plan:     In-House Referral:     Discharge planning Services     Post Acute Care Choice:    Choice offered to:     DME Arranged:    DME Agency:     HH Arranged:    [place] Agency:     Status of Service:     Medicare Important Message Given:  Yes-second notification given Date Medicare IM Given:    Medicare IM give by:    Date Additional Medicare IM Given:    Additional Medicare Important Message give by:     If discussed at [place] of Stay Meetings, dates discussed:    Additional Comments:  Maebell Lyvers A, RN 05/19/2015, 8:42 AM

## 2015-05-19 NOTE — Care Management Note (Signed)
Case Management Note  Patient Details  Name: Mackenzie Rose MRN: [id number] Date of Birth: 1946/09/05  Subjective/Objective:   Received call from Peter Congo at [facility] who stated that [name]'s liability begins tomorrow 05/20/15 at 12noon. [name] has been advised that she has the right to a second appreal.                  Action/Plan:   Expected Discharge Date:                  Expected Discharge Plan:     In-House Referral:     Discharge planning Services     Post Acute Care Choice:    Choice offered to:     DME Arranged:    DME Agency:     HH Arranged:    [place] Agency:     Status of Service:     Medicare Important Message Given:  Yes-second notification given Date Medicare IM Given:    Medicare IM give by:    Date Additional Medicare IM Given:    Additional Medicare Important Message give by:     If discussed at [place] of Stay Meetings, dates discussed:    Additional Comments:  La Dibella A, RN 05/19/2015, 4:31 PM

## 2015-05-20 ENCOUNTER — Encounter: Payer: Self-pay | Admitting: Emergency Medicine

## 2015-05-20 ENCOUNTER — Emergency Department: Payer: Medicare Other

## 2015-05-20 ENCOUNTER — Inpatient Hospital Stay
Admission: EM | Admit: 2015-05-20 | Discharge: 2015-05-23 | DRG: 378 | Disposition: A | Discharge status: Home / Self Care | Payer: Medicare Other | Attending: Internal Medicine | Admitting: Internal Medicine

## 2015-05-20 DIAGNOSIS — E780 Pure hypercholesterolemia: Secondary | ICD-10-CM | POA: Diagnosis present

## 2015-05-20 DIAGNOSIS — I4891 Unspecified atrial fibrillation: Secondary | ICD-10-CM | POA: Diagnosis present

## 2015-05-20 DIAGNOSIS — K921 Melena: Principal | ICD-10-CM | POA: Diagnosis present

## 2015-05-20 DIAGNOSIS — N184 Chronic kidney disease, stage 4 (severe): Secondary | ICD-10-CM | POA: Diagnosis present

## 2015-05-20 DIAGNOSIS — R1084 Generalized abdominal pain: Secondary | ICD-10-CM

## 2015-05-20 DIAGNOSIS — I129 Hypertensive chronic kidney disease with stage 1 through stage 4 chronic kidney disease, or unspecified chronic kidney disease: Secondary | ICD-10-CM | POA: Diagnosis present

## 2015-05-20 DIAGNOSIS — D638 Anemia in other chronic diseases classified elsewhere: Secondary | ICD-10-CM | POA: Diagnosis present

## 2015-05-20 DIAGNOSIS — E119 Type 2 diabetes mellitus without complications: Secondary | ICD-10-CM | POA: Diagnosis present

## 2015-05-20 DIAGNOSIS — J449 Chronic obstructive pulmonary disease, unspecified: Secondary | ICD-10-CM | POA: Diagnosis present

## 2015-05-20 DIAGNOSIS — Z833 Family history of diabetes mellitus: Secondary | ICD-10-CM

## 2015-05-20 DIAGNOSIS — Z79899 Other long term (current) drug therapy: Secondary | ICD-10-CM

## 2015-05-20 DIAGNOSIS — F419 Anxiety disorder, unspecified: Secondary | ICD-10-CM | POA: Diagnosis present

## 2015-05-20 DIAGNOSIS — Z9849 Cataract extraction status, unspecified eye: Secondary | ICD-10-CM

## 2015-05-20 DIAGNOSIS — D500 Iron deficiency anemia secondary to blood loss (chronic): Secondary | ICD-10-CM | POA: Diagnosis present

## 2015-05-20 DIAGNOSIS — Z9851 Tubal ligation status: Secondary | ICD-10-CM

## 2015-05-20 DIAGNOSIS — Z9049 Acquired absence of other specified parts of digestive tract: Secondary | ICD-10-CM | POA: Diagnosis present

## 2015-05-20 DIAGNOSIS — D649 Anemia, unspecified: Secondary | ICD-10-CM | POA: Diagnosis present

## 2015-05-20 DIAGNOSIS — Z8249 Family history of ischemic heart disease and other diseases of the circulatory system: Secondary | ICD-10-CM

## 2015-05-20 DIAGNOSIS — Z85820 Personal history of malignant melanoma of skin: Secondary | ICD-10-CM

## 2015-05-20 DIAGNOSIS — K259 Gastric ulcer, unspecified as acute or chronic, without hemorrhage or perforation: Secondary | ICD-10-CM | POA: Diagnosis present

## 2015-05-20 DIAGNOSIS — R748 Abnormal levels of other serum enzymes: Secondary | ICD-10-CM | POA: Diagnosis present

## 2015-05-20 DIAGNOSIS — G7000 Myasthenia gravis without (acute) exacerbation: Secondary | ICD-10-CM | POA: Diagnosis present

## 2015-05-20 DIAGNOSIS — K922 Gastrointestinal hemorrhage, unspecified: Secondary | ICD-10-CM | POA: Insufficient documentation

## 2015-05-20 DIAGNOSIS — N390 Urinary tract infection, site not specified: Secondary | ICD-10-CM | POA: Diagnosis present

## 2015-05-20 DIAGNOSIS — Z880 Allergy status to penicillin: Secondary | ICD-10-CM

## 2015-05-20 DIAGNOSIS — Z888 Allergy status to other drugs, medicaments and biological substances status: Secondary | ICD-10-CM

## 2015-05-20 DIAGNOSIS — Z803 Family history of malignant neoplasm of breast: Secondary | ICD-10-CM

## 2015-05-20 DIAGNOSIS — F17210 Nicotine dependence, cigarettes, uncomplicated: Secondary | ICD-10-CM | POA: Diagnosis present

## 2015-05-20 DIAGNOSIS — D62 Acute posthemorrhagic anemia: Secondary | ICD-10-CM | POA: Diagnosis present

## 2015-05-20 DIAGNOSIS — F0390 Unspecified dementia without behavioral disturbance: Secondary | ICD-10-CM | POA: Diagnosis present

## 2015-05-20 DIAGNOSIS — K449 Diaphragmatic hernia without obstruction or gangrene: Secondary | ICD-10-CM | POA: Diagnosis present

## 2015-05-20 DIAGNOSIS — Z7951 Long term (current) use of inhaled steroids: Secondary | ICD-10-CM

## 2015-05-20 DIAGNOSIS — K2980 Duodenitis without bleeding: Secondary | ICD-10-CM | POA: Diagnosis present

## 2015-05-20 LAB — BASIC METABOLIC PANEL
Anion gap: 3 — ABNORMAL LOW (ref 5–15)
Anion gap: 8 (ref 5–15)
BUN: 27 mg/dL — AB (ref 6–20)
BUN: 26 mg/dL — AB (ref 6–20)
CALCIUM: 8.2 mg/dL — AB (ref 8.9–10.3)
CALCIUM: 8.6 mg/dL — AB (ref 8.9–10.3)
CHLORIDE: 106 mmol/L (ref 101–111)
CO2: 30 mmol/L (ref 22–32)
CO2: 24 mmol/L (ref 22–32)
CREATININE: 1.4 mg/dL — AB (ref 0.44–1.00)
CREATININE: 1.44 mg/dL — AB (ref 0.44–1.00)
Chloride: 108 mmol/L (ref 101–111)
GFR calc Af Amer: 43 mL/min — ABNORMAL LOW (ref >60)
GFR calc Af Amer: 41 mL/min — ABNORMAL LOW (ref >60)
GFR, EST NON AFRICAN AMERICAN: 37 mL/min — AB (ref >60)
GFR, EST NON AFRICAN AMERICAN: 36 mL/min — AB (ref >60)
Glucose, Bld: 106 mg/dL — ABNORMAL HIGH (ref 65–99)
Glucose, Bld: 100 mg/dL — ABNORMAL HIGH (ref 65–99)
Potassium: 4.8 mmol/L (ref 3.5–5.1)
Potassium: 4.7 mmol/L (ref 3.5–5.1)
SODIUM: 141 mmol/L (ref 135–145)
SODIUM: 138 mmol/L (ref 135–145)

## 2015-05-20 LAB — CBC
HCT: 20.4 % — ABNORMAL LOW (ref 35.0–47.0)
HCT: 24 % — ABNORMAL LOW (ref 35.0–47.0)
Hemoglobin: 5.9 g/dL — ABNORMAL LOW (ref 12.0–16.0)
Hemoglobin: 7 g/dL — ABNORMAL LOW (ref 12.0–16.0)
MCH: 20.2 pg — AB (ref 26.0–34.0)
MCH: 20.2 pg — ABNORMAL LOW (ref 26.0–34.0)
MCHC: 29 g/dL — AB (ref 32.0–36.0)
MCHC: 29.2 g/dL — ABNORMAL LOW (ref 32.0–36.0)
MCV: 69.7 fL — ABNORMAL LOW (ref 80.0–100.0)
MCV: 69.1 fL — AB (ref 80.0–100.0)
PLATELETS: 161 10*3/uL (ref 150–440)
PLATELETS: 219 10*3/uL (ref 150–440)
RBC: 2.93 MIL/uL — ABNORMAL LOW (ref 3.80–5.20)
RBC: 3.47 MIL/uL — ABNORMAL LOW (ref 3.80–5.20)
RDW: 19.6 % — ABNORMAL HIGH (ref 11.5–14.5)
RDW: 20.2 % — AB (ref 11.5–14.5)
WBC: 8.9 10*3/uL (ref 3.6–11.0)
WBC: 13.6 10*3/uL — AB (ref 3.6–11.0)

## 2015-05-20 LAB — URINALYSIS COMPLETE WITH MICROSCOPIC (ARMC ONLY)
Bilirubin Urine: NEGATIVE
GLUCOSE, UA: NEGATIVE mg/dL
Ketones, ur: NEGATIVE mg/dL
Nitrite: NEGATIVE
Protein, ur: >500 mg/dL — AB
SPECIFIC GRAVITY, URINE: 1.013 (ref 1.005–1.030)
Trans Epithel, UA: 1
pH: 6 (ref 5.0–8.0)

## 2015-05-20 LAB — TROPONIN I: TROPONIN I: 0.07 ng/mL — AB (ref <0.031)

## 2015-05-20 LAB — ABO/RH: ABO/RH(D): O POS

## 2015-05-20 MED ORDER — PANTOPRAZOLE SODIUM 40 MG IV SOLR
80.0000 mg | Freq: Once | INTRAVENOUS | Status: AC
  Administered 2015-05-21: 80 mg via INTRAVENOUS
  Filled 2015-05-20 (×2): qty 80

## 2015-05-20 MED ORDER — MORPHINE SULFATE (PF) 4 MG/ML IV SOLN
4.0000 mg | Freq: Once | INTRAVENOUS | Status: AC
  Administered 2015-05-20: 4 mg via INTRAVENOUS
  Filled 2015-05-20: qty 1

## 2015-05-20 MED ORDER — SODIUM CHLORIDE 0.9 % IV SOLN
10.0000 mL/h | Freq: Once | INTRAVENOUS | Status: AC
  Administered 2015-05-21: 10 mL/h via INTRAVENOUS

## 2015-05-20 MED ORDER — ONDANSETRON HCL 4 MG/2ML IJ SOLN
4.0000 mg | Freq: Once | INTRAMUSCULAR | Status: AC
  Administered 2015-05-20: 4 mg via INTRAVENOUS
  Filled 2015-05-20: qty 2

## 2015-05-20 MED ORDER — SODIUM CHLORIDE 0.9 % IV BOLUS (SEPSIS)
500.0000 mL | Freq: Once | INTRAVENOUS | Status: AC
  Administered 2015-05-20: 500 mL via INTRAVENOUS

## 2015-05-20 MED ORDER — DEXTROSE 5 % IV SOLN
1.0000 g | Freq: Once | INTRAVENOUS | Status: AC
  Administered 2015-05-21: 1 g via INTRAVENOUS
  Filled 2015-05-20: qty 10

## 2015-05-20 MED ORDER — PANTOPRAZOLE SODIUM 40 MG IV SOLR: 40.0000 mg | Freq: Two times a day (BID) | INTRAVENOUS | Status: DC

## 2015-05-20 MED ORDER — SODIUM CHLORIDE 0.9 % IV SOLN
8.0000 mg/h | INTRAVENOUS | Status: DC
  Administered 2015-05-21: 8 mg/h via INTRAVENOUS
  Filled 2015-05-20: qty 80

## 2015-05-20 MED ORDER — IOHEXOL 240 MG/ML SOLN
25.0000 mL | INTRAMUSCULAR | Status: AC
  Administered 2015-05-20: 25 mL via ORAL

## 2015-05-20 NOTE — Progress Notes (Signed)
Patient had discharge orders for 05/19/2015 and got discharged early am of 05/20/2015 due to transportation issues. Routine labs done prior to discharge just resulted back. It shows the hemoglobin dropped to 5.9 whereas her baseline is around 8. Patient hasn't had any signs or symptoms of anemia. Her last hemoglobin 2 days ago was around 8. Partly dilutional and partly it could be lab error. [name] and left a message to recheck her hemoglobin tomorrow or get her to the ER if she is symptomatic. No active bleeding reported. Also discussed with care manager who would fax the hemoglobin check order to the home health agency.

## 2015-05-20 NOTE — ED Notes (Signed)
MD notified of trop level critical lab result

## 2015-05-20 NOTE — ED Notes (Addendum)
Pt was here on 7th of sept and discharged to [facility], not equipped for rehab. Stated medicare said she need rehab. She came to ED so " she can be properly placed in a rehab place. Complain of back pain and sob. Stated sob and back pain has not stopped since she left the hosp.Marland Kitchen

## 2015-05-20 NOTE — ED Provider Notes (Signed)
[facility] Emergency Department Provider Note  ____________________________________________  Time seen: Approximately 545 PM  I have reviewed the triage vital signs and the nursing notes.   HISTORY  Chief Complaint Weakness; Shortness of Breath; and Back Pain    HPI Mackenzie Rose is a 69 y.o. female who was discharged earlier today for syncope, dehydration and renal failure who is presenting today because of increased weakness. She says that she is also had several episodes of nonbloody vomitus. Also with diffuse abdominal pain. Was discharged this morning with a hemoglobin of 5.9. The plan was for the residents to recheck her hemoglobin in 1 day or return to the emergency department if the patient was symptomatically. Because of being symptomatically with weakness and shortness of breath, the patient returned.   Past Medical History  Diagnosis Date  . Heart disease   . Atrial fibrillation     a. reported a-fib; b. unknown chronicity; c. dates back to 1970s; d. not on long term anticoagulation 2/2 no documented a-fib  . Hypertension   . High cholesterol   . Diabetes     borderline  . COPD (chronic obstructive pulmonary disease)   . Melanoma   . Kidney disorder   . Tremor   . Autoimmune disease   . Depressed   . Anxiety disorder   . Migraine   . Dementia   . Motion sickness   . Myasthenia gravis   . Bipolar disorder   . Dementia   . Acute renal failure   . Near syncope     Patient Active Problem List   Diagnosis Date Noted  . Syncope 05/17/2015  . Near syncope 03/25/2015  . ARF (acute renal failure) 03/25/2015  . COPD exacerbation 03/25/2015  . Anxiety 12/12/2014  . Bipolar affective disorder, mixed 12/12/2014  . Renal insufficiency syndrome 12/12/2014  . H/O: HTN (hypertension) 12/12/2014  . H/O diabetes mellitus 12/12/2014  . CAFL (chronic airflow limitation) 12/12/2014  . Bipolar disorder   . Dementia   . Ulnar neuropathy at elbow  of right upper extremity 09/22/2014  . Myasthenia gravis 11/22/2013    Past Surgical History  Procedure Laterality Date  . Tubal ligation    . Retena repair    . Cataract extraction    . Cholecystectomy      Current Outpatient Rx  Name  Route  Sig  Dispense  Refill  . acetaminophen (TYLENOL) 650 MG CR tablet   Oral   Take 650 mg by mouth daily. Pt takes daily at 2pm and every four hours as needed for pain.         Marland Kitchen albuterol (PROVENTIL HFA;VENTOLIN HFA) 108 (90 BASE) MCG/ACT inhaler   Inhalation   Inhale 2 puffs into the lungs every 4 (four) hours as needed for wheezing or shortness of breath.   6.7 g   1   . bismuth subsalicylate (PEPTO BISMOL) 262 MG/15ML suspension   Oral   Take 5 mLs by mouth 4 (four) times daily as needed for diarrhea or loose stools (or nausea).         . busPIRone (BUSPAR) 10 MG tablet   Oral   Take 10 mg by mouth 2 (two) times daily.          . ciprofloxacin (CIPRO) 500 MG tablet   Oral   Take 1 tablet (500 mg total) by mouth 2 (two) times daily. For 14 days   6 tablet   0   . Dextromethorphan-Benzocaine (SORE THROAT &  COUGH LOZENGES MT)   Mouth/Throat   Use as directed 1 lozenge in the mouth or throat 4 (four) times daily as needed (for cough).         . diclofenac sodium (VOLTAREN) 1 % GEL   Topical   Apply 2 g topically 2 (two) times daily.          Marland Kitchen diltiazem (CARDIZEM CD) 180 MG 24 hr capsule   Oral   Take 180 mg by mouth daily.          Marland Kitchen donepezil (ARICEPT) 10 MG tablet   Oral   Take 1 tablet (10 mg total) by mouth daily.   30 tablet   3   . Fluticasone-Salmeterol (ADVAIR) 250-50 MCG/DOSE AEPB   Inhalation   Inhale 1 puff into the lungs 2 (two) times daily.         Marland Kitchen guaifenesin (ROBITUSSIN) 100 MG/5ML syrup   Oral   Take 100 mg by mouth 2 (two) times daily as needed for cough.         . hydrALAZINE (APRESOLINE) 10 MG tablet   Oral   Take 10 mg by mouth 3 (three) times daily.         Marland Kitchen lamoTRIgine  (LAMICTAL) 100 MG tablet   Oral   Take 100 mg by mouth 2 (two) times daily.         Marland Kitchen latanoprost (XALATAN) 0.005 % ophthalmic solution   Both Eyes   Place 1 drop into both eyes at bedtime.          . nicotine (NICODERM CQ - DOSED IN MG/24 HR) 7 mg/24hr patch   Transdermal   Place 1 patch (7 mg total) onto the skin daily.   15 patch   0   . ondansetron (ZOFRAN) 4 MG tablet   Oral   Take 1 tablet (4 mg total) by mouth every 8 (eight) hours as needed for nausea or vomiting.   20 tablet   0   . phenylephrine-shark liver oil-mineral oil-petrolatum (PREPARATION H) 0.25-3-14-71.9 % rectal ointment   Rectal   Place 1 application rectally 2 (two) times daily as needed for hemorrhoids.         . polyethylene glycol (MIRALAX / GLYCOLAX) packet   Oral   Take 17 g by mouth daily as needed for mild constipation.         . predniSONE (DELTASONE) 10 MG tablet   Oral   Take 30 mg by mouth every other day.         . pyridostigmine (MESTINON) 60 MG tablet   Oral   Take 60 mg by mouth 3 (three) times daily.          . QUEtiapine (SEROQUEL) 200 MG tablet   Oral   Take 1 tablet (200 mg total) by mouth at bedtime.   30 tablet   3   . sodium chloride (OCEAN) 0.65 % SOLN nasal spray   Each Nare   Place 2 sprays into both nostrils daily as needed for congestion.          . traMADol (ULTRAM) 50 MG tablet   Oral   Take 50 mg by mouth 2 (two) times daily.         . traZODone (DESYREL) 50 MG tablet   Oral   Take 1 tablet (50 mg total) by mouth at bedtime. Take 1/2 at bedtime. Patient taking differently: Take 50 mg by mouth at bedtime.    30 tablet  2     Allergies Lactose intolerance (gi); Lithium; and Penicillins  Family History  Problem Relation Age of Onset  . Heart disease Father   . Other Mother     ruptured hernia  . Breast cancer Sister   . Heart disease Sister   . Diabetes Mellitus II Sister     x2    Social History Social History  Substance Use  Topics  . Smoking status: Current Some Day Smoker -- 2.00 packs/day for 50 years    Types: Cigarettes    Start date: 03/28/1965  . Smokeless tobacco: Never Used  . Alcohol Use: No     Comment: Occasionally    Review of Systems Constitutional: No fever/chills Eyes: No visual changes. ENT: No sore throat. Cardiovascular: Denies chest pain. Respiratory: As above  Gastrointestinal:   No diarrhea.  No constipation. Genitourinary: Negative for dysuria. Musculoskeletal: Negative for back pain. Skin: Negative for rash. Neurological: Negative for headaches, focal weakness or numbness.  10-point ROS otherwise negative.  ____________________________________________   PHYSICAL EXAM:  VITAL SIGNS: ED Triage Vitals  Enc Vitals Group     BP 05/20/15 1926 162/76 mmHg     Pulse Rate 05/20/15 1926 91     Resp 05/20/15 1926 20     Temp 05/20/15 1926 98.6 F (37 C)     Temp Source 05/20/15 1926 Oral     SpO2 05/20/15 1926 98 %     Weight 05/20/15 1926 126 lb (57.153 kg)     Height 05/20/15 1926 5\' 4"  (1.626 m)     Head Cir --      Peak Flow --      Pain Score 05/20/15 1928 8     Pain Loc --      Pain Edu? --      Excl. in [place]? --     Constitutional: Alert and oriented. Well appearing and in no acute distress. Eyes: Conjunctivae are pale. PERRL. EOMI. Head: Atraumatic. Nose: No congestion/rhinnorhea. Mouth/Throat: Mucous membranes are moist.  Oropharynx non-erythematous. Neck: No stridor.   Cardiovascular: Normal rate, regular rhythm. Grossly normal heart sounds.  Good peripheral circulation. Respiratory: Normal respiratory effort.  No retractions. Lungs CTAB. Gastrointestinal: Soft with diffuse tenderness palpation. No distention. No abdominal bruits. No CVA tenderness. Rectal exam with melena which is strongly heme positive on Hemoccult. Musculoskeletal: Mild bilateral and equal lower extremity edema which the patient says is her baseline..  No joint effusions. Neurologic:   Normal speech and language. No gross focal neurologic deficits are appreciated. No gait instability. Skin:  Skin is warm, dry and intact. No rash noted. Psychiatric: Mood and affect are normal. Speech and behavior are normal.  ____________________________________________   LABS (all labs ordered are listed, but only abnormal results are displayed)  Labs Reviewed  BASIC METABOLIC PANEL - Abnormal; Notable for the following:    Glucose, Bld 100 (*)    BUN 26 (*)    Creatinine, Ser 1.44 (*)    Calcium 8.6 (*)    GFR calc non Af Amer 36 (*)    GFR calc Af Amer 41 (*)    All other components within normal limits  CBC - Abnormal; Notable for the following:    WBC 13.6 (*)    RBC 3.47 (*)    Hemoglobin 7.0 (*)    HCT 24.0 (*)    MCV 69.1 (*)    MCH 20.2 (*)    MCHC 29.2 (*)    RDW 20.2 (*)  All other components within normal limits  URINALYSIS COMPLETEWITH MICROSCOPIC ([facility] ONLY) - Abnormal; Notable for the following:    Color, Urine YELLOW (*)    APPearance HAZY (*)    Hgb urine dipstick 3+ (*)    Protein, ur >500 (*)    Leukocytes, UA 3+ (*)    Bacteria, UA RARE (*)    Squamous Epithelial / LPF 6-30 (*)    All other components within normal limits  TROPONIN I - Abnormal; Notable for the following:    Troponin I 0.07 (*)    All other components within normal limits  CBG MONITORING, ED  TYPE AND SCREEN  PREPARE RBC (CROSSMATCH)  ABO/RH   ____________________________________________  EKG  ED ECG REPORT I, Doran Stabler, the attending physician, personally viewed and interpreted this ECG.   Date: 05/20/2015  EKG Time: 2152  Rate: 96  Rhythm: normal sinus rhythm  Axis: Normal axis  Intervals:right bundle branch block  ST&T Change: T-wave inversion in V2.  No change from 05/17/2015. ____________________________________________  RADIOLOGY  Minimal bilateral pleural effusions on the chest x-ray. I personally reviewed these  films. ____________________________________________   PROCEDURES  CRITICAL CARE Performed by: Doran Stabler   Total critical care time: 35 minutes  Critical care time was exclusive of separately billable procedures and treating other patients.  Critical care was necessary to treat or prevent imminent or life-threatening deterioration.  Critical care was time spent personally by me on the following activities: development of treatment plan with patient and/or surrogate as well as nursing, discussions with consultants, evaluation of patient's response to treatment, examination of patient, obtaining history from patient or surrogate, ordering and performing treatments and interventions, ordering and review of laboratory studies, ordering and review of radiographic studies, pulse oximetry and re-evaluation of patient's condition.  ____________________________________________   INITIAL IMPRESSION / ASSESSMENT AND PLAN / ED COURSE  Pertinent labs & imaging results that were available during my care of the patient were reviewed by me and considered in my medical decision making (see chart for details).  ----------------------------------------- 10:49 PM on 05/20/2015 -----------------------------------------  We'll transfuse irradiated blood secondary to myasthenia gravis. Patient was symptomatically anemia secondary to GI bleeding. Is not on any anticoagulants at this time. Will require admission. Also begun on Protonix drip. Awaiting CAT scan of the abdomen to rule out any surgical pathology secondary to elevated white count with diffuse abdominal pain.  ----------------------------------------- 11:45 PM on 05/20/2015 -----------------------------------------  Patient showing UTI on urinalysis. We'll treat with ceftriaxone. Signed out to Dr. Dahlia Client for follow-up of CAT scan and admission. ____________________________________________   FINAL CLINICAL IMPRESSION(S) / ED  DIAGNOSES  Final diagnoses:  Acute GI bleeding  Generalized abdominal pain  Symptomatic anemia      Orbie Pyo, MD 05/20/15 [id number]

## 2015-05-20 NOTE — Progress Notes (Signed)
Pt to be discharged to [facility] today. Iv and tele removed. Report called to glenda at [facility]. Pt to be transported via e.m.s.

## 2015-05-20 NOTE — ED Notes (Signed)
Patient transported to X-ray via wheelchair; tech to return pt to triage for IV placement and lab draw; charge nurse aware of pt's presenting complaints and recent discharge

## 2015-05-20 NOTE — ED Notes (Addendum)
Patient from [facility]. Patient is protesting her discharge due to her inability to walk. However patient ambulated for EMS with cane without difficulty. Patient was d/c'd today from room 247 at this facility. Patient has no complaints. Patient has appealed discharge earlier today and it was denied.

## 2015-05-20 NOTE — ED Notes (Signed)
CBG 100MG /DL

## 2015-05-20 NOTE — ED Notes (Addendum)
Pt states she was discharged today from [facility] "under protest"; says she was not ready to leave and has filed a protest with Medicare; pt says she is still feeling weak; still short of breath; talking in complete coherent sentences; talkative in triage

## 2015-05-20 NOTE — Progress Notes (Signed)
[place] RN to draw a Hgb on 05/21/15 and have results sent to Ms [name] PCP, Dr Clayborn Bigness. T.O: Dr Hart Rochester Kalisetti/[place], RN, BSN, CM on 05/20/15 @ 10:45am.

## 2015-05-20 NOTE — Progress Notes (Signed)
[facility] [place] NAME: Mackenzie Rose    MR#:  [id number]  DATE OF BIRTH:  1946-06-27  SUBJECTIVE:  CHIEF COMPLAINT:   Chief Complaint  Patient presents with  . Fall  - patient was supposed to be discharged yesterday to [facility], did not have a bowel movement yesterday. Had bowel movement later yesterday but due to transportation issues is being discharged today.  REVIEW OF SYSTEMS:  Review of Systems  Constitutional: Negative for fever and chills.  Respiratory: Negative for cough, shortness of breath and wheezing.   Cardiovascular: Negative for chest pain and palpitations.  Gastrointestinal: Negative for nausea, vomiting, abdominal pain, diarrhea and constipation.  Genitourinary: Negative for dysuria.  Neurological: Negative for dizziness, seizures and headaches.    DRUG ALLERGIES:   Allergies  Allergen Reactions  . Lactose Intolerance (Gi) Other (See Comments)    Reaction:  GI upset   . Lithium Other (See Comments)    Reaction:  Unknown   . Penicillins Rash    VITALS:  Blood pressure 151/85, pulse 96, temperature 98.4 F (36.9 C), temperature source Oral, resp. rate 18, height 5\' 4"  (1.626 m), weight 59.739 kg (131 lb 11.2 oz), SpO2 96 %.  PHYSICAL EXAMINATION:  Physical Exam  GENERAL: 69 y.o.-year-old patient lying in the bed with no acute distress.  EYES: Pupils equal, round, reactive to light and accommodation. No scleral icterus. Extraocular muscles intact. Poor visual acuity. HEENT: Head atraumatic, normocephalic. Oropharynx and nasopharynx clear. stapels + NECK: Supple, no jugular venous distention. No thyroid enlargement, no tenderness.  LUNGS: Normal breath sounds bilaterally, no wheezing, rales,rhonchi or crepitation. No use of accessory muscles of respiration.  CARDIOVASCULAR: S1, S2 normal. No rubs, or gallops. 3/6 systolic murmur present. ABDOMEN: Soft, non-tender,  non-distended. Bowel sounds present. No organomegaly or mass.  EXTREMITIES: No pedal edema, cyanosis, or clubbing.  NEUROLOGIC: Cranial nerves II through XII are intact. Muscle strength 5/5 in all extremities. Sensation intact. Gait not checked.  PSYCHIATRIC: The patient is alert and oriented x 3.  SKIN: No obvious rash, lesion, or ulcer.   LABORATORY PANEL:   CBC  Recent Labs Lab 05/20/15 0721  WBC 8.9  HGB 5.9*  HCT 20.4*  PLT 161   ------------------------------------------------------------------------------------------------------------------  Chemistries   Recent Labs Lab 05/16/15 2323  05/20/15 0721  NA 137  --  141  K 2.6*  < > 4.8  CL 101  --  108  CO2 24  --  30  GLUCOSE 234*  --  106*  BUN 18  --  27*  CREATININE 1.77*  --  1.40*  CALCIUM 8.4*  --  8.2*  AST 32  --   --   ALT 16  --   --   ALKPHOS 84  --   --   BILITOT 0.2*  --   --   < > = values in this interval not displayed. ------------------------------------------------------------------------------------------------------------------  Cardiac Enzymes  Recent Labs Lab 05/17/15 1551  TROPONINI 0.07*   ------------------------------------------------------------------------------------------------------------------  RADIOLOGY:  No results found.  EKG:   Orders placed or performed during the hospital encounter of 05/16/15  . EKG 12-Lead  . EKG 12-Lead    ASSESSMENT AND PLAN:   69 year old female with past history significant for near syncope and myasthenia gravis admitted for syncope and elevated troponin.  1. Syncope:  - Sounds like a vasovagal syncope -Generalized weakness secondary to ARF (baseline creatinine 1.03) came in with creat of  2.02.. improving with IVF. -laceration s/p staples in the ER. will need to get staples remeoved with PCP -More stronger this morning and was able to stand up prior to discharge.  2. Myasthenia gravis: Continue pyridostigmine and  prednisone -No acute changes.  3. Urinary tract infection: Continue ciprofloxacin  4. Hypokalemia: Repleted orally   5. Atrial fibrillation: No indication of rapid ventricular rate or tachyarrhythmia. Continue no anticoagulation due to fall risk; continue diltiazem CR -mild elevated troponin w/o h/o CAD. EKG no acute changes. Could be some due to elevated creat also.  6. Hypertension: Controlled; amlodipine and Coreg discontinued last admission. Continue hydralazine. Also on Cardizem  7. COPD: Continue inhaled corticosteroid.   8. Dementia: Continue Aricept, Lamictal and sleep aids at night   Note: Patient had discharge orders for 05/19/2015 and got discharged early am of 05/20/2015 Routine labs done prior to discharge just resulted back. It shows the hemoglobin dropped to 5.9 whereas her baseline is around 8. Patient hasn't had any signs or symptoms of anemia. Her last hemoglobin 2 days ago was around 8. Partly dilutional and partly it could be and lab ever. Called [name] and left a message to recheck her hemoglobin tomorrow or get her to the ER if she is symptomatic. No active bleeding reported. Also discussed with care manager who would fax the hemoglobin check order to the home health agency.  All the records are reviewed and case discussed with Care Management/Social Workerr. Management plans discussed with the patient, family and they are in agreement.  CODE STATUS: FULL CODE  TOTAL TIME TAKING CARE OF THIS PATIENT: 36 minutes.   POSSIBLE D/C TODAY, DEPENDING ON CLINICAL CONDITION.   Gladstone Lighter M.D on 05/20/2015 at 10:34 AM  Between 7am to 6pm - Pager - [phone]  After 6pm go to [email or web address] - password [email or web address] [facility]  Office  [phone]  CC: Primary care physician; Lavera Guise, MD

## 2015-05-20 NOTE — Clinical Social Work Note (Signed)
Patient is ready for discharge today according to patient's nurse, Manuela Schwartz. Manuela Schwartz informed CSW that she had contacted [name] this morning as was told that they do not have their transportation person today and that patient would need to return via EMS. CSW contacted the facility and spoke with [facility] who confirmed this. EMS form prepared. Patient's nurse stated there have been no updates to discharge since she was discharged a couple of days ago but decided to appeal her discharge and lost. Shela Leff MSW,LCSW [phone]

## 2015-05-21 ENCOUNTER — Encounter: Payer: Self-pay | Admitting: *Deleted

## 2015-05-21 ENCOUNTER — Emergency Department: Payer: Medicare Other

## 2015-05-21 DIAGNOSIS — Z833 Family history of diabetes mellitus: Secondary | ICD-10-CM | POA: Diagnosis not present

## 2015-05-21 DIAGNOSIS — N390 Urinary tract infection, site not specified: Secondary | ICD-10-CM | POA: Diagnosis not present

## 2015-05-21 DIAGNOSIS — F419 Anxiety disorder, unspecified: Secondary | ICD-10-CM | POA: Diagnosis not present

## 2015-05-21 DIAGNOSIS — R748 Abnormal levels of other serum enzymes: Secondary | ICD-10-CM | POA: Diagnosis not present

## 2015-05-21 DIAGNOSIS — D500 Iron deficiency anemia secondary to blood loss (chronic): Secondary | ICD-10-CM | POA: Diagnosis not present

## 2015-05-21 DIAGNOSIS — J449 Chronic obstructive pulmonary disease, unspecified: Secondary | ICD-10-CM | POA: Diagnosis not present

## 2015-05-21 DIAGNOSIS — F17210 Nicotine dependence, cigarettes, uncomplicated: Secondary | ICD-10-CM | POA: Diagnosis not present

## 2015-05-21 DIAGNOSIS — Z803 Family history of malignant neoplasm of breast: Secondary | ICD-10-CM | POA: Diagnosis not present

## 2015-05-21 DIAGNOSIS — R1084 Generalized abdominal pain: Secondary | ICD-10-CM | POA: Diagnosis present

## 2015-05-21 DIAGNOSIS — Z9049 Acquired absence of other specified parts of digestive tract: Secondary | ICD-10-CM | POA: Diagnosis not present

## 2015-05-21 DIAGNOSIS — I4891 Unspecified atrial fibrillation: Secondary | ICD-10-CM | POA: Diagnosis not present

## 2015-05-21 DIAGNOSIS — Z8249 Family history of ischemic heart disease and other diseases of the circulatory system: Secondary | ICD-10-CM | POA: Diagnosis not present

## 2015-05-21 DIAGNOSIS — D62 Acute posthemorrhagic anemia: Secondary | ICD-10-CM | POA: Diagnosis not present

## 2015-05-21 DIAGNOSIS — D638 Anemia in other chronic diseases classified elsewhere: Secondary | ICD-10-CM | POA: Diagnosis not present

## 2015-05-21 DIAGNOSIS — N184 Chronic kidney disease, stage 4 (severe): Secondary | ICD-10-CM | POA: Diagnosis not present

## 2015-05-21 DIAGNOSIS — Z7951 Long term (current) use of inhaled steroids: Secondary | ICD-10-CM | POA: Diagnosis not present

## 2015-05-21 DIAGNOSIS — Z888 Allergy status to other drugs, medicaments and biological substances status: Secondary | ICD-10-CM | POA: Diagnosis not present

## 2015-05-21 DIAGNOSIS — I129 Hypertensive chronic kidney disease with stage 1 through stage 4 chronic kidney disease, or unspecified chronic kidney disease: Secondary | ICD-10-CM | POA: Diagnosis not present

## 2015-05-21 DIAGNOSIS — Z79899 Other long term (current) drug therapy: Secondary | ICD-10-CM | POA: Diagnosis not present

## 2015-05-21 DIAGNOSIS — Z85820 Personal history of malignant melanoma of skin: Secondary | ICD-10-CM | POA: Diagnosis not present

## 2015-05-21 DIAGNOSIS — G7000 Myasthenia gravis without (acute) exacerbation: Secondary | ICD-10-CM | POA: Diagnosis not present

## 2015-05-21 DIAGNOSIS — K922 Gastrointestinal hemorrhage, unspecified: Secondary | ICD-10-CM | POA: Diagnosis not present

## 2015-05-21 DIAGNOSIS — Z9849 Cataract extraction status, unspecified eye: Secondary | ICD-10-CM | POA: Diagnosis not present

## 2015-05-21 DIAGNOSIS — D649 Anemia, unspecified: Secondary | ICD-10-CM | POA: Diagnosis not present

## 2015-05-21 DIAGNOSIS — K449 Diaphragmatic hernia without obstruction or gangrene: Secondary | ICD-10-CM | POA: Diagnosis not present

## 2015-05-21 DIAGNOSIS — Z9851 Tubal ligation status: Secondary | ICD-10-CM | POA: Diagnosis not present

## 2015-05-21 DIAGNOSIS — K2980 Duodenitis without bleeding: Secondary | ICD-10-CM | POA: Diagnosis not present

## 2015-05-21 DIAGNOSIS — K259 Gastric ulcer, unspecified as acute or chronic, without hemorrhage or perforation: Secondary | ICD-10-CM | POA: Diagnosis not present

## 2015-05-21 DIAGNOSIS — Z880 Allergy status to penicillin: Secondary | ICD-10-CM | POA: Diagnosis not present

## 2015-05-21 DIAGNOSIS — E119 Type 2 diabetes mellitus without complications: Secondary | ICD-10-CM | POA: Diagnosis not present

## 2015-05-21 DIAGNOSIS — K921 Melena: Secondary | ICD-10-CM | POA: Diagnosis present

## 2015-05-21 DIAGNOSIS — E780 Pure hypercholesterolemia: Secondary | ICD-10-CM | POA: Diagnosis not present

## 2015-05-21 DIAGNOSIS — F0390 Unspecified dementia without behavioral disturbance: Secondary | ICD-10-CM | POA: Diagnosis present

## 2015-05-21 LAB — IRON AND TIBC
Iron: 44 ug/dL (ref 28–170)
SATURATION RATIOS: 13 % (ref 10.4–31.8)
TIBC: 344 ug/dL (ref 250–450)
UIBC: 300 ug/dL

## 2015-05-21 LAB — MRSA PCR SCREENING: MRSA by PCR: NEGATIVE

## 2015-05-21 LAB — PREPARE RBC (CROSSMATCH)

## 2015-05-21 LAB — CBC
HCT: 32.6 % — ABNORMAL LOW (ref 35.0–47.0)
HEMOGLOBIN: 10 g/dL — AB (ref 12.0–16.0)
MCH: 22.5 pg — ABNORMAL LOW (ref 26.0–34.0)
MCHC: 30.6 g/dL — AB (ref 32.0–36.0)
MCV: 73.3 fL — ABNORMAL LOW (ref 80.0–100.0)
PLATELETS: 196 10*3/uL (ref 150–440)
RBC: 4.45 MIL/uL (ref 3.80–5.20)
RDW: 20 % — ABNORMAL HIGH (ref 11.5–14.5)
WBC: 11.6 10*3/uL — ABNORMAL HIGH (ref 3.6–11.0)

## 2015-05-21 LAB — TROPONIN I
Troponin I: 0.07 ng/mL — ABNORMAL HIGH (ref <0.031)
Troponin I: 0.06 ng/mL — ABNORMAL HIGH (ref <0.031)
Troponin I: 0.06 ng/mL — ABNORMAL HIGH (ref <0.031)

## 2015-05-21 LAB — RETICULOCYTES
RBC.: 4.25 MIL/uL (ref 3.80–5.20)
Retic Count, Absolute: 123.3 10*3/uL (ref 19.0–183.0)
Retic Ct Pct: 2.9 % (ref 0.4–3.1)

## 2015-05-21 LAB — GLUCOSE, CAPILLARY: GLUCOSE-CAPILLARY: 100 mg/dL — AB (ref 65–99)

## 2015-05-21 LAB — FERRITIN: Ferritin: 18 ng/mL (ref 11–307)

## 2015-05-21 LAB — VITAMIN B12: VITAMIN B 12: 251 pg/mL (ref 180–914)

## 2015-05-21 LAB — FOLATE: FOLATE: 13.6 ng/mL (ref >5.9)

## 2015-05-21 MED ORDER — DONEPEZIL HCL 5 MG PO TABS
10.0000 mg | ORAL_TABLET | Freq: Every day | ORAL | Status: DC
  Administered 2015-05-21 – 2015-05-23 (×3): 10 mg via ORAL
  Filled 2015-05-21 (×3): qty 2

## 2015-05-21 MED ORDER — PREDNISONE 5 MG PO TABS
30.0000 mg | ORAL_TABLET | ORAL | Status: DC
  Administered 2015-05-21 – 2015-05-23 (×2): 30 mg via ORAL
  Filled 2015-05-21 (×2): qty 2

## 2015-05-21 MED ORDER — GUAIFENESIN 100 MG/5ML PO SYRP: 100.0000 mg | ORAL_SOLUTION | Freq: Two times a day (BID) | ORAL | Status: DC | PRN

## 2015-05-21 MED ORDER — ACETAMINOPHEN ER 650 MG PO TBCR: 650.0000 mg | EXTENDED_RELEASE_TABLET | Freq: Every day | ORAL | Status: DC

## 2015-05-21 MED ORDER — TRAZODONE HCL 50 MG PO TABS
50.0000 mg | ORAL_TABLET | Freq: Every day | ORAL | Status: DC
  Administered 2015-05-21 – 2015-05-22 (×2): 50 mg via ORAL
  Filled 2015-05-21 (×3): qty 1

## 2015-05-21 MED ORDER — HYDRALAZINE HCL 25 MG PO TABS
25.0000 mg | ORAL_TABLET | Freq: Three times a day (TID) | ORAL | Status: DC
  Administered 2015-05-21 – 2015-05-22 (×5): 25 mg via ORAL
  Filled 2015-05-21 (×5): qty 1

## 2015-05-21 MED ORDER — TRAMADOL HCL 50 MG PO TABS
50.0000 mg | ORAL_TABLET | Freq: Two times a day (BID) | ORAL | Status: DC
  Administered 2015-05-21 – 2015-05-23 (×5): 50 mg via ORAL
  Filled 2015-05-21 (×6): qty 1

## 2015-05-21 MED ORDER — POLYETHYLENE GLYCOL 3350 17 G PO PACK: 17.0000 g | PACK | Freq: Every day | ORAL | Status: DC | PRN

## 2015-05-21 MED ORDER — PYRIDOSTIGMINE BROMIDE 60 MG PO TABS
60.0000 mg | ORAL_TABLET | Freq: Three times a day (TID) | ORAL | Status: DC
  Administered 2015-05-21 – 2015-05-23 (×6): 60 mg via ORAL
  Filled 2015-05-21 (×8): qty 1

## 2015-05-21 MED ORDER — QUETIAPINE FUMARATE 100 MG PO TABS
200.0000 mg | ORAL_TABLET | Freq: Every day | ORAL | Status: DC
  Administered 2015-05-21 – 2015-05-22 (×2): 200 mg via ORAL
  Filled 2015-05-21 (×2): qty 2
  Filled 2015-05-21: qty 1

## 2015-05-21 MED ORDER — DILTIAZEM HCL ER COATED BEADS 180 MG PO CP24
180.0000 mg | ORAL_CAPSULE | Freq: Every day | ORAL | Status: DC
  Administered 2015-05-21 – 2015-05-23 (×3): 180 mg via ORAL
  Filled 2015-05-21 (×3): qty 1

## 2015-05-21 MED ORDER — LAMOTRIGINE 100 MG PO TABS
100.0000 mg | ORAL_TABLET | Freq: Two times a day (BID) | ORAL | Status: DC
  Administered 2015-05-21 – 2015-05-23 (×5): 100 mg via ORAL
  Filled 2015-05-21 (×6): qty 1

## 2015-05-21 MED ORDER — CIPROFLOXACIN HCL 500 MG PO TABS
500.0000 mg | ORAL_TABLET | Freq: Two times a day (BID) | ORAL | Status: DC
  Administered 2015-05-21 – 2015-05-23 (×6): 500 mg via ORAL
  Filled 2015-05-21 (×6): qty 1

## 2015-05-21 MED ORDER — PHENYLEPH-SHARK LIV OIL-MO-PET 0.25-3-14-71.9 % RE OINT: 1.0000 "application " | TOPICAL_OINTMENT | Freq: Two times a day (BID) | RECTAL | Status: DC | PRN

## 2015-05-21 MED ORDER — SODIUM CHLORIDE 0.9 % IV SOLN
INTRAVENOUS | Status: DC
  Administered 2015-05-21 – 2015-05-22 (×2): via INTRAVENOUS

## 2015-05-21 MED ORDER — SALINE SPRAY 0.65 % NA SOLN: 2.0000 | Freq: Every day | NASAL | Status: DC | PRN

## 2015-05-21 MED ORDER — NICOTINE 7 MG/24HR TD PT24
7.0000 mg | MEDICATED_PATCH | Freq: Every day | TRANSDERMAL | Status: DC
  Administered 2015-05-21 – 2015-05-23 (×3): 7 mg via TRANSDERMAL
  Filled 2015-05-21 (×3): qty 1

## 2015-05-21 MED ORDER — MOMETASONE FURO-FORMOTEROL FUM 100-5 MCG/ACT IN AERO
2.0000 | INHALATION_SPRAY | Freq: Two times a day (BID) | RESPIRATORY_TRACT | Status: DC
  Administered 2015-05-21 – 2015-05-23 (×5): 2 via RESPIRATORY_TRACT
  Filled 2015-05-21: qty 8.8

## 2015-05-21 MED ORDER — PANTOPRAZOLE SODIUM 40 MG PO TBEC
40.0000 mg | DELAYED_RELEASE_TABLET | Freq: Two times a day (BID) | ORAL | Status: DC
  Administered 2015-05-21 – 2015-05-23 (×4): 40 mg via ORAL
  Filled 2015-05-21 (×4): qty 1

## 2015-05-21 MED ORDER — BISMUTH SUBSALICYLATE 262 MG/15ML PO SUSP: 5.0000 mL | Freq: Four times a day (QID) | ORAL | Status: DC | PRN

## 2015-05-21 MED ORDER — LATANOPROST 0.005 % OP SOLN
1.0000 [drp] | Freq: Every day | OPHTHALMIC | Status: DC
  Administered 2015-05-21 – 2015-05-22 (×2): 1 [drp] via OPHTHALMIC
  Filled 2015-05-21: qty 2.5

## 2015-05-21 MED ORDER — HYDRALAZINE HCL 10 MG PO TABS
10.0000 mg | ORAL_TABLET | Freq: Three times a day (TID) | ORAL | Status: DC
  Administered 2015-05-21: 10 mg via ORAL
  Filled 2015-05-21: qty 1

## 2015-05-21 MED ORDER — ACETAMINOPHEN 325 MG PO TABS
650.0000 mg | ORAL_TABLET | Freq: Every day | ORAL | Status: DC
  Administered 2015-05-21 – 2015-05-23 (×2): 650 mg via ORAL
  Filled 2015-05-21 (×2): qty 2

## 2015-05-21 MED ORDER — BUSPIRONE HCL 10 MG PO TABS
10.0000 mg | ORAL_TABLET | Freq: Two times a day (BID) | ORAL | Status: DC
  Administered 2015-05-21 – 2015-05-23 (×5): 10 mg via ORAL
  Filled 2015-05-21 (×6): qty 1

## 2015-05-21 MED ORDER — ONDANSETRON HCL 4 MG PO TABS: 4.0000 mg | ORAL_TABLET | Freq: Three times a day (TID) | ORAL | Status: DC | PRN

## 2015-05-21 MED ORDER — ALBUTEROL SULFATE (2.5 MG/3ML) 0.083% IN NEBU: 3.0000 mL | INHALATION_SOLUTION | RESPIRATORY_TRACT | Status: DC | PRN

## 2015-05-21 MED ORDER — SODIUM CHLORIDE 0.9 % IJ SOLN
3.0000 mL | Freq: Two times a day (BID) | INTRAMUSCULAR | Status: DC
  Administered 2015-05-21: 3 mL via INTRAVENOUS

## 2015-05-21 NOTE — Clinical Social Work Note (Signed)
Clinical Social Work Assessment  Patient Details  Name: Mackenzie Rose MRN: [id number] Date of Birth: 1946-08-01  Date of referral:  05/21/15               Reason for consult:  Facility Placement                Permission sought to share information with:  Facility Sport and exercise psychologist, Family Supports Permission granted to share information::  Yes, Verbal Permission Granted  Name::      ([facility], SNF/ALF facility reps)  Agency::     Relationship::     Contact Information:     Housing/Transportation Living arrangements for the past 2 months:  [place] of Information:  Patient Patient Interpreter Needed:  None Criminal Activity/Legal Involvement Pertinent to Current Situation/Hospitalization:  No - Comment as needed Significant Relationships:  Adult Children Lives with:  Facility Resident Do you feel safe going back to the place where you live?  No Need for family participation in patient care:  No (Coment)  Care giving concerns:  Patient admitted from [facility]- may need higher level of care at [facility]- PT pending   Facilities manager / plan:  CSW met with patient who states she has lived at [facility] for about one year- she is open to considering SNF at dc and feels she may need something more long term. "I would like to get back to [facility]"- she reports having one son in [place]. SHe also has a sister who is local here but has her own medical problems. CSW will complete FL2 and PASARR for dc planning (?SNF)  Employment status:    Insurance information:    PT Recommendations:    Information / Referral to community resources:  [place]  Patient/Family's Response to care:  CSW contacted patient's son, Laurey Arrow, to discuss [employer]. He is also agreeable to plans for higher level of care if needed. He   Patient/Family's Understanding of and Emotional Response to Diagnosis, Current Treatment, and Prognosis:  Patient appears to have a good  understanding of her medical  Emotional Assessment Appearance:  Developmentally appropriate Attitude/Demeanor/Rapport:  Other (positive and appropriate) Affect (typically observed):  Accepting, Appropriate Orientation:  Oriented to Self, Oriented to Place, Oriented to  Time, Oriented to Situation Alcohol / Substance use:  Not Applicable Psych involvement (Current and /or in the community):  No (Comment)  Discharge Needs  Concerns to be addressed:  Discharge Planning Concerns Readmission within the last 30 days:  No Current discharge risk:  Physical Impairment Barriers to Discharge:  No Barriers Identified   Ludwig Clarks, LCSW 05/21/2015, 10:25 AM

## 2015-05-21 NOTE — Progress Notes (Signed)
Mackenzie Rose NAME: Mackenzie Rose    MR#:  [id number]  DATE OF BIRTH:  [birth year]  SUBJECTIVE:  CHIEF COMPLAINT:   Chief Complaint  Patient presents with  . Weakness  . Shortness of Breath  . Back Pain   -Patient admitted for syncope and just Discharged yesterday. Labs drawn just prior to discharge came back later with hemoglobin of 5.9, baseline hemoglobin around 8. -Message left to the assisted living facility and patient sent back for weakness. Hemoglobin on admission was 7. -Patient did receive 2 units of packed RBC transfusion and hemoglobin pending at this time. -No active bleeding noted. Complains of melena.  REVIEW OF SYSTEMS:  Review of Systems  Constitutional: Negative for fever and chills.  HENT: Negative for ear discharge, ear pain and tinnitus.   Eyes: Negative for blurred vision and double vision.       Poor visual acuity  Respiratory: Negative for cough, shortness of breath and wheezing.   Cardiovascular: Negative for chest pain and palpitations.  Gastrointestinal: Negative for nausea, vomiting, abdominal pain, diarrhea and constipation.  Genitourinary: Negative for dysuria.  Neurological: Positive for weakness. Negative for dizziness, tremors, sensory change, speech change, seizures and headaches.  Psychiatric/Behavioral: Negative for depression. The patient is nervous/anxious.     DRUG ALLERGIES:   Allergies  Allergen Reactions  . Lactose Intolerance (Gi) Other (See Comments)    Reaction:  GI upset   . Lithium Other (See Comments)    Reaction:  Unknown   . Penicillins Rash    VITALS:  Blood pressure 139/75, pulse 90, temperature 97.7 F (36.5 C), temperature source Oral, resp. rate 16, height 5\' 4"  (1.626 m), weight 59.104 kg (130 lb 4.8 oz), SpO2 98 %.  PHYSICAL EXAMINATION:  Physical Exam  GENERAL: 69 y.o.-year-old patient lying in the bed with no acute distress.  EYES: Pupils equal,  round, reactive to light and accommodation. No scleral icterus. Extraocular muscles intact. Poor visual acuity. HEENT: Head atraumatic, normocephalic. Oropharynx and nasopharynx clear. stapels + NECK: Supple, no jugular venous distention. No thyroid enlargement, no tenderness.  LUNGS: Normal breath sounds bilaterally, no wheezing, rales,rhonchi or crepitation. No use of accessory muscles of respiration. Decreased bibasilar breath sounds. CARDIOVASCULAR: S1, S2 normal. No rubs, or gallops. 3/6 systolic murmur present. ABDOMEN: Soft, non-tender, non-distended. Bowel sounds present. No organomegaly or mass.  EXTREMITIES: No pedal edema, cyanosis, or clubbing.  NEUROLOGIC: Cranial nerves II through XII are intact. Muscle strength 5/5 in all extremities. Sensation intact. Gait not checked.  PSYCHIATRIC: The patient is alert and oriented x 3.  SKIN: No obvious rash, lesion, or ulcer.   LABORATORY PANEL:   CBC  Recent Labs Lab 05/20/15 2042  WBC 13.6*  HGB 7.0*  HCT 24.0*  PLT 219   ------------------------------------------------------------------------------------------------------------------  Chemistries   Recent Labs Lab 05/16/15 2323  05/20/15 2042  NA 137  < > 138  K 2.6*  < > 4.7  CL 101  < > 106  CO2 24  < > 24  GLUCOSE 234*  < > 100*  BUN 18  < > 26*  CREATININE 1.77*  < > 1.44*  CALCIUM 8.4*  < > 8.6*  AST 32  --   --   ALT 16  --   --   ALKPHOS 84  --   --   BILITOT 0.2*  --   --   < > = values in this interval not displayed. ------------------------------------------------------------------------------------------------------------------  Cardiac  Enzymes  Recent Labs Lab 05/21/15 0513  TROPONINI 0.07*   ------------------------------------------------------------------------------------------------------------------  RADIOLOGY:  Ct Abdomen Pelvis Wo Contrast  05/21/2015   CLINICAL DATA:  Diffuse primarily lower abdominal pain with GI bleed and  melena. Increased weakness.  EXAM: CT ABDOMEN AND PELVIS WITHOUT CONTRAST  TECHNIQUE: Multidetector CT imaging of the abdomen and pelvis was performed following the standard protocol without IV contrast.  COMPARISON:  Abdominal ultrasound 12/08/2014  FINDINGS: Lower chest: Small bilateral pleural effusions. Ill-defined partially ground-glass opacity in the left lower lobe, only partially included in indeterminate. Mild atelectasis in both lower lobes.  Liver: Normal in size. Left lobe hepatic cyst measures 1 cm in the region of the porta hepatis.  Hepatobiliary: Gallbladder is physiologically distended. The calcifications in the gallbladder wall on prior ultrasound are not well seen on CT. No calcified gallstones. No biliary dilatation.  Pancreas: No ductal dilatation or surrounding inflammation.  Spleen: Normal.  Adrenal glands: No nodule. Mild left adrenal thickening without discrete nodule.  Kidneys: Atrophic left kidney with compensatory hypertrophy of the right kidney. Lower pole cyst measures 2.8 cm in the right kidney, the complexity and ultrasound is not well seen by CT. Cyst in the upper pole measures 3.1 cm. This cyst in the left kidney on prior ultrasound is not well seen. No hydronephrosis.  Stomach/Bowel: Moderately large hiatal hernia. Stomach physiologically distended. There are no dilated or thickened small bowel loops. No bowel dilatation. Diverticulosis throughout the distal colon without diverticulitis. Small volume of stool throughout the colon without colonic wall thickening. The appendix is normal.  Vascular/Lymphatic: No retroperitoneal adenopathy. Fusiform dilatation of the distal descending and suprarenal abdominal aorta, maximal dimension 3.6 cm. Distal and mid aorta are tortuous, no aneurysmal dilatation of the infrarenal abdominal aorta. There is diffuse atheromatous calcifications. IVC appears narrowed.  Reproductive: Uterus is atrophic, normal for age. Ovaries are quiescent, normal for  age. No adnexal mass.  Bladder: Physiologically distended without wall thickening.  Other: No free air, free fluid, or intra-abdominal fluid collection. Fat within both inguinal canals.  Musculoskeletal: There are no acute or suspicious osseous abnormalities. Scoliotic curvature in the spine with associated degenerative change. Compression deformities of L1 and L2 appear chronic.  IMPRESSION: 1. Diverticulosis throughout the colon without diverticulitis. 2. Moderately large hiatal hernia. 3. Left renal atrophy with mild compensatory hypertrophy of the right kidney. There are right renal cysts, the complexity on prior ultrasound is not well appreciated. Cystic lesion in the left kidney are not well appreciated. Recommend further characterization on a nonemergent basis with MRI, with contrast if renal function permits. 4. 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 Recommend followup by ultrasound in 2 years. This recommendation follows ACR consensus guidelines: White Paper of the ACR Incidental Findings Committee II on Vascular Findings. J Am Coll Radiol 2013; 10:789-794. 5. Ill-defined opacity in the left lower lobe, only partially included in the field of view. This may be infectious or inflammatory, however recommend nonemergent chest CT for further characterization as this was not seen radiographically.   Electronically Signed   By: Jeb Levering M.D.   On: 05/21/2015 02:10   Dg Chest 2 View  05/20/2015   CLINICAL DATA:  Shortness of breath.  EXAM: CHEST  2 VIEW  COMPARISON:  April 25, 2015.  FINDINGS: Stable cardiomediastinal silhouette. Stable hiatal hernia. No pneumothorax is noted. Minimal bilateral pleural effusions are noted. No acute pulmonary disease is noted. Stable calcified granuloma is noted in  the left upper lobe. The visualized skeletal structures are unremarkable.  IMPRESSION: Minimal bilateral pleural effusions.  Stable hiatal  hernia.   Electronically Signed   By: Marijo Conception, M.D.   On: 05/20/2015 20:17    EKG:   Orders placed or performed during the hospital encounter of 05/20/15  . ED EKG  . ED EKG  . EKG 12-Lead  . EKG 12-Lead    ASSESSMENT AND PLAN:   69 year old female with past history significant for near syncope and myasthenia gravis admitted for syncope and elevated troponin.  1. Anemia-acute on chronic anemia. Has known history of anemia of chronic disease. -Iron tests pending. GI consult. -Stool for occult blood pending due to history of melena. -Received 2 units of packed RBC transfusion at this time. Check orthostatic vital signs. -Follow up CBC. No active bleeding. -Change Protonix to by mouth twice a day -Started on a full liquid diet.  2. Myasthenia gravis: Continue pyridostigmine and prednisone -No acute changes.  3. Urinary tract infection: Continue her Cipro until 05/23/2015.  4. Anxiety-continue home medications  5. Atrial fibrillation: No indication of rapid ventricular rate or tachyarrhythmia. Continue no anticoagulation due to fall risk; continue diltiazem CR  6. Hypertension: Controlled; on Cardizem. Blood pressure has remained elevated. At oral hydralazine.  7. COPD: Continue inhaled corticosteroid.   8. Dementia: Continue Aricept, Lamictal and sleep aids at night   Physical therapy consult is pending. Patient would like to go to a different facility. Social worker consult.  All the records are reviewed and case discussed with Care Management/Social Workerr. Management plans discussed with the patient, family and they are in agreement.  CODE STATUS: FULL CODE  TOTAL TIME TAKING CARE OF THIS PATIENT: 37 minutes.   POSSIBLE D/C TODAY, DEPENDING ON CLINICAL CONDITION.   Gladstone Lighter M.D on 05/21/2015 at 11:46 AM  Between 7am to 6pm - Pager - [phone]  After 6pm go to [email or web address] - password [email or web address] [facility]  Office   [phone]  CC: Primary care physician; Lavera Guise, MD

## 2015-05-21 NOTE — Consult Note (Signed)
[facility]  [address] [place], [place] [address] Phone: [phone] Fax : [phone]  Consultation  Referring Provider:     No ref. provider found Primary Care Physician:  Lavera Guise, MD Primary Gastroenterologist:  None         Reason for Consultation:     Anemia  Date of Admission:  05/20/2015 Date of Consultation:  05/21/2015         HPI:   Mackenzie Rose is a 69 y.o. female who was admitted with syncope after being discharged from emergency room yesterday. The patient was found to have a hemoglobin of 5.9 with a baseline around 8. The patient was transfused and reports waking up to her normal mentation. The patient returns emergency department with nausea and vomiting on admission. She also was reported to have melanotic stools. There was a report of some chest pain and shortness of breath when the patient was seen in the ER with no signs of distress. The patient reports that she has myasthenia gravis. I'm now being asked to see the patient for her anemia and black stools.  Past Medical History  Diagnosis Date  . Heart disease   . Atrial fibrillation     a. reported a-fib; b. unknown chronicity; c. dates back to 1970s; d. not on long term anticoagulation 2/2 no documented a-fib  . Hypertension   . High cholesterol   . Diabetes     borderline  . COPD (chronic obstructive pulmonary disease)   . Melanoma   . Kidney disorder   . Tremor   . Autoimmune disease   . Depressed   . Anxiety disorder   . Migraine   . Dementia   . Motion sickness   . Myasthenia gravis   . Bipolar disorder   . Dementia   . Acute renal failure   . Near syncope     Past Surgical History  Procedure Laterality Date  . Tubal ligation    . Retena repair    . Cataract extraction    . Cholecystectomy      Prior to Admission medications   Medication Sig Start Date End Date Taking? Authorizing Provider  acetaminophen (TYLENOL) 650 MG CR tablet Take 650 mg by mouth  daily. Pt takes daily at 2pm and every four hours as needed for pain.    Historical Provider, MD  albuterol (PROVENTIL HFA;VENTOLIN HFA) 108 (90 BASE) MCG/ACT inhaler Inhale 2 puffs into the lungs every 4 (four) hours as needed for wheezing or shortness of breath. 03/25/15   Srikar Sudini, MD  bismuth subsalicylate (PEPTO BISMOL) 262 MG/15ML suspension Take 5 mLs by mouth 4 (four) times daily as needed for diarrhea or loose stools (or nausea).    Historical Provider, MD  busPIRone (BUSPAR) 10 MG tablet Take 10 mg by mouth 2 (two) times daily.  04/23/15   Historical Provider, MD  ciprofloxacin (CIPRO) 500 MG tablet Take 1 tablet (500 mg total) by mouth 2 (two) times daily. For 14 days 05/18/15   Fritzi Mandes, MD  Dextromethorphan-Benzocaine (SORE THROAT & COUGH LOZENGES MT) Use as directed 1 lozenge in the mouth or throat 4 (four) times daily as needed (for cough).    Historical Provider, MD  diclofenac sodium (VOLTAREN) 1 % GEL Apply 2 g topically 2 (two) times daily.     Historical Provider, MD  diltiazem (CARDIZEM CD) 180 MG 24 hr capsule Take 180 mg by mouth daily.     Historical Provider, MD  donepezil (  ARICEPT) 10 MG tablet Take 1 tablet (10 mg total) by mouth daily. 03/29/15   Rainey Pines, MD  Fluticasone-Salmeterol (ADVAIR) 250-50 MCG/DOSE AEPB Inhale 1 puff into the lungs 2 (two) times daily.    Historical Provider, MD  guaifenesin (ROBITUSSIN) 100 MG/5ML syrup Take 100 mg by mouth 2 (two) times daily as needed for cough.    Historical Provider, MD  hydrALAZINE (APRESOLINE) 10 MG tablet Take 10 mg by mouth 3 (three) times daily.    Historical Provider, MD  lamoTRIgine (LAMICTAL) 100 MG tablet Take 100 mg by mouth 2 (two) times daily.    Historical Provider, MD  latanoprost (XALATAN) 0.005 % ophthalmic solution Place 1 drop into both eyes at bedtime.     Historical Provider, MD  nicotine (NICODERM CQ - DOSED IN MG/24 HR) 7 mg/24hr patch Place 1 patch (7 mg total) onto the skin daily. 03/25/15   Srikar  Sudini, MD  ondansetron (ZOFRAN) 4 MG tablet Take 1 tablet (4 mg total) by mouth every 8 (eight) hours as needed for nausea or vomiting. 05/18/15   Fritzi Mandes, MD  phenylephrine-shark liver oil-mineral oil-petrolatum (PREPARATION H) 0.25-3-14-71.9 % rectal ointment Place 1 application rectally 2 (two) times daily as needed for hemorrhoids.    Historical Provider, MD  polyethylene glycol (MIRALAX / GLYCOLAX) packet Take 17 g by mouth daily as needed for mild constipation.    Historical Provider, MD  predniSONE (DELTASONE) 10 MG tablet Take 30 mg by mouth every other day.    Historical Provider, MD  pyridostigmine (MESTINON) 60 MG tablet Take 60 mg by mouth 3 (three) times daily.     Historical Provider, MD  QUEtiapine (SEROQUEL) 200 MG tablet Take 1 tablet (200 mg total) by mouth at bedtime. 03/29/15   Rainey Pines, MD  sodium chloride (OCEAN) 0.65 % SOLN nasal spray Place 2 sprays into both nostrils daily as needed for congestion.     Historical Provider, MD  traMADol (ULTRAM) 50 MG tablet Take 50 mg by mouth 2 (two) times daily.    Historical Provider, MD  traZODone (DESYREL) 50 MG tablet Take 1 tablet (50 mg total) by mouth at bedtime. Take 1/2 at bedtime. Patient taking differently: Take 50 mg by mouth at bedtime.  03/29/15   Rainey Pines, MD    Family History  Problem Relation Age of Onset  . Heart disease Father   . Other Mother     ruptured hernia  . Breast cancer Sister   . Heart disease Sister   . Diabetes Mellitus II Sister     x2     Social History  Substance Use Topics  . Smoking status: Current Some Day Smoker -- 2.00 packs/day for 50 years    Types: Cigarettes    Start date: 03/28/1965  . Smokeless tobacco: Never Used  . Alcohol Use: No     Comment: Occasionally    Allergies as of 05/20/2015 - Review Complete 05/20/2015  Allergen Reaction Noted  . Lactose intolerance (gi) Other (See Comments) 04/25/2015  . Lithium Other (See Comments) 11/22/2013  . Penicillins Rash  07/14/2014    Review of Systems:    All systems reviewed and negative except where noted in HPI.   Physical Exam:  Vital signs in last 24 hours: Temp:  [97.7 F (36.5 C)-98.5 F (36.9 C)] 98.1 F (36.7 C) (09/12 1958) Pulse Rate:  [78-99] 92 (09/12 1958) Resp:  [14-24] 20 (09/12 1958) BP: (116-189)/(68-101) 178/88 mmHg (09/12 1958) SpO2:  [92 %-100 %] 99 % (  09/12 1958) Weight:  [130 lb 4.8 oz (59.104 kg)] 130 lb 4.8 oz (59.104 kg) (09/12 0334) Last BM Date: 05/19/15 General:   Pleasant, cooperative in NAD Head:  Normocephalic and atraumatic. Eyes:   No icterus.   Conjunctiva pink. PERRLA. Ears:  Normal auditory acuity. Neck:  Supple; no masses or thyroidomegaly Lungs: Respirations even and unlabored. Lungs clear to auscultation bilaterally.   No wheezes, crackles, or rhonchi.  Heart:  Regular rate and rhythm;  Without murmur, clicks, rubs or gallops Abdomen:  Soft, nondistended, nontender. Normal bowel sounds. No appreciable masses or hepatomegaly.  No rebound or guarding.  Rectal:  Not performed. Msk:  Symmetrical without gross deformities.  Strength decreased  Extremities:  Without edema, cyanosis or clubbing. Neurologic:  Alert and oriented x3;  grossly normal neurologically. Skin:  Intact without significant lesions or rashes. Cervical Nodes:  No significant cervical adenopathy. Psych:  Alert and cooperative. Normal affect.  LAB RESULTS:  Recent Labs  05/20/15 0721 05/20/15 2042 05/21/15 1057  WBC 8.9 13.6* 11.6*  HGB 5.9* 7.0* 10.0*  HCT 20.4* 24.0* 32.6*  PLT 161 219 196   BMET  Recent Labs  05/20/15 0721 05/20/15 2042  NA 141 138  K 4.8 4.7  CL 108 106  CO2 30 24  GLUCOSE 106* 100*  BUN 27* 26*  CREATININE 1.40* 1.44*  CALCIUM 8.2* 8.6*   LFT No results for input(s): PROT, ALBUMIN, AST, ALT, ALKPHOS, BILITOT, BILIDIR, IBILI in the last 72 hours. PT/INR No results for input(s): LABPROT, INR in the last 72 hours.  STUDIES: Ct Abdomen Pelvis Wo  Contrast  05/21/2015   CLINICAL DATA:  Diffuse primarily lower abdominal pain with GI bleed and melena. Increased weakness.  EXAM: CT ABDOMEN AND PELVIS WITHOUT CONTRAST  TECHNIQUE: Multidetector CT imaging of the abdomen and pelvis was performed following the standard protocol without IV contrast.  COMPARISON:  Abdominal ultrasound 12/08/2014  FINDINGS: Lower chest: Small bilateral pleural effusions. Ill-defined partially ground-glass opacity in the left lower lobe, only partially included in indeterminate. Mild atelectasis in both lower lobes.  Liver: Normal in size. Left lobe hepatic cyst measures 1 cm in the region of the porta hepatis.  Hepatobiliary: Gallbladder is physiologically distended. The calcifications in the gallbladder wall on prior ultrasound are not well seen on CT. No calcified gallstones. No biliary dilatation.  Pancreas: No ductal dilatation or surrounding inflammation.  Spleen: Normal.  Adrenal glands: No nodule. Mild left adrenal thickening without discrete nodule.  Kidneys: Atrophic left kidney with compensatory hypertrophy of the right kidney. Lower pole cyst measures 2.8 cm in the right kidney, the complexity and ultrasound is not well seen by CT. Cyst in the upper pole measures 3.1 cm. This cyst in the left kidney on prior ultrasound is not well seen. No hydronephrosis.  Stomach/Bowel: Moderately large hiatal hernia. Stomach physiologically distended. There are no dilated or thickened small bowel loops. No bowel dilatation. Diverticulosis throughout the distal colon without diverticulitis. Small volume of stool throughout the colon without colonic wall thickening. The appendix is normal.  Vascular/Lymphatic: No retroperitoneal adenopathy. Fusiform dilatation of the distal descending and suprarenal abdominal aorta, maximal dimension 3.6 cm. Distal and mid aorta are tortuous, no aneurysmal dilatation of the infrarenal abdominal aorta. There is diffuse atheromatous calcifications. IVC  appears narrowed.  Reproductive: Uterus is atrophic, normal for age. Ovaries are quiescent, normal for age. No adnexal mass.  Bladder: Physiologically distended without wall thickening.  Other: No free air, free fluid, or intra-abdominal fluid collection. Fat  within both inguinal canals.  Musculoskeletal: There are no acute or suspicious osseous abnormalities. Scoliotic curvature in the spine with associated degenerative change. Compression deformities of L1 and L2 appear chronic.  IMPRESSION: 1. Diverticulosis throughout the colon without diverticulitis. 2. Moderately large hiatal hernia. 3. Left renal atrophy with mild compensatory hypertrophy of the right kidney. There are right renal cysts, the complexity on prior ultrasound is not well appreciated. Cystic lesion in the left kidney are not well appreciated. Recommend further characterization on a nonemergent basis with MRI, with contrast if renal function permits. 4. 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 Recommend followup by ultrasound in 2 years. This recommendation follows ACR consensus guidelines: White Paper of the ACR Incidental Findings Committee II on Vascular Findings. J Am Coll Radiol 2013; 10:789-794. 5. Ill-defined opacity in the left lower lobe, only partially included in the field of view. This may be infectious or inflammatory, however recommend nonemergent chest CT for further characterization as this was not seen radiographically.   Electronically Signed   By: Jeb Levering M.D.   On: 05/21/2015 02:10   Dg Chest 2 View  05/20/2015   CLINICAL DATA:  Shortness of breath.  EXAM: CHEST  2 VIEW  COMPARISON:  April 25, 2015.  FINDINGS: Stable cardiomediastinal silhouette. Stable hiatal hernia. No pneumothorax is noted. Minimal bilateral pleural effusions are noted. No acute pulmonary disease is noted. Stable calcified granuloma is noted in the left upper lobe. The visualized  skeletal structures are unremarkable.  IMPRESSION: Minimal bilateral pleural effusions.  Stable hiatal hernia.   Electronically Signed   By: Marijo Conception, M.D.   On: 05/20/2015 20:17      Impression / Plan:   Mackenzie Rose is a 69 y.o. y/o female with a history of myasthenia gravis and chronic anemia who came in with a hemoglobin of 5.9 and confusion with symptoms of shortness of breath and chest pain. The patient was transfused and denies any other symptoms at the present time. The patient has chronic abdominal pain she reports to be part of her myasthenia gravis. The patient will be set up for an EGD for tomorrow to evaluate her stomach for a cause of her black stools. The patient has been explained the plan and agrees with it. I have discussed risks & benefits which include, but are not limited to, bleeding, infection, perforation & drug reaction.  The patient agrees with this plan & written consent will be obtained.      Thank you for involving me in the care of this patient.      LOS: 0 days   Ollen Bowl, MD  05/21/2015, 8:09 PM   Note: This dictation was prepared with Dragon dictation along with smaller phrase technology. Any transcriptional errors that result from this process are unintentional.

## 2015-05-21 NOTE — Care Management (Signed)
Message sent to Sharrie Rothman with [facility] home [facility] for update on lab draw (Hgb) on 05/21/15 since patient is still in the hospital. Patient is from [place] followed by [employer]. RNCM to continue to follow.

## 2015-05-21 NOTE — H&P (Addendum)
Mackenzie Rose is an 69 y.o. female.   Chief Complaint: Nausea and vomiting HPI: The patient presents emergency department hours following discharge from the hospital this morning. At that time her complaint was weakness. She was initially found to have a hemoglobin of 5.9 that likely represented dilutional change and discharged from the emergency department but returned complaining of nausea and vomiting. There was also report of melanotic stool. Repeat evaluation of blood count showed a hemoglobin of 7 which is decreased from the value of 8.6 g yesterday. The patient also reported that she was displeased about her disposition and would prefer a rehabilitation facility. Upon this examiner's interview with the patient she also complained of chest pain and shortness of breath but appeared in no distress whatsoever. She also complains of some mild abdominal pain. The patient is also clearly disoriented as she called me "Marylyn Ishihara" after I introduced myself as her physician and wanted to know if she could go to someplace completely unrelated to the hospital which indicates that perhaps dementia has has caused the patient to be a poor historian. Nonetheless, due to her anemia presumably from a GI bleed the emergency department staff called for admission.  Past Medical History  Diagnosis Date  . Heart disease   . Atrial fibrillation     a. reported a-fib; b. unknown chronicity; c. dates back to 1970s; d. not on long term anticoagulation 2/2 no documented a-fib  . Hypertension   . High cholesterol   . Diabetes     borderline  . COPD (chronic obstructive pulmonary disease)   . Melanoma   . Kidney disorder   . Tremor   . Autoimmune disease   . Depressed   . Anxiety disorder   . Migraine   . Dementia   . Motion sickness   . Myasthenia gravis   . Bipolar disorder   . Dementia   . Acute renal failure   . Near syncope     Past Surgical History  Procedure Laterality Date  . Tubal ligation    . Retena  repair    . Cataract extraction    . Cholecystectomy      Family History  Problem Relation Age of Onset  . Heart disease Father   . Other Mother     ruptured hernia  . Breast cancer Sister   . Heart disease Sister   . Diabetes Mellitus II Sister     x2   Social History:  reports that she has been smoking Cigarettes.  She started smoking about 50 years ago. She has a 100 pack-year smoking history. She has never used smokeless tobacco. She reports that she does not drink alcohol or use illicit drugs.  Allergies:  Allergies  Allergen Reactions  . Lactose Intolerance (Gi) Other (See Comments)    Reaction:  GI upset   . Lithium Other (See Comments)    Reaction:  Unknown   . Penicillins Rash    Medications Prior to Admission  Medication Sig Dispense Refill  . acetaminophen (TYLENOL) 650 MG CR tablet Take 650 mg by mouth daily. Pt takes daily at 2pm and every four hours as needed for pain.    Marland Kitchen albuterol (PROVENTIL HFA;VENTOLIN HFA) 108 (90 BASE) MCG/ACT inhaler Inhale 2 puffs into the lungs every 4 (four) hours as needed for wheezing or shortness of breath. 6.7 g 1  . bismuth subsalicylate (PEPTO BISMOL) 262 MG/15ML suspension Take 5 mLs by mouth 4 (four) times daily as needed for diarrhea or loose  stools (or nausea).    . busPIRone (BUSPAR) 10 MG tablet Take 10 mg by mouth 2 (two) times daily.     . ciprofloxacin (CIPRO) 500 MG tablet Take 1 tablet (500 mg total) by mouth 2 (two) times daily. For 14 days 6 tablet 0  . Dextromethorphan-Benzocaine (SORE THROAT & COUGH LOZENGES MT) Use as directed 1 lozenge in the mouth or throat 4 (four) times daily as needed (for cough).    . diclofenac sodium (VOLTAREN) 1 % GEL Apply 2 g topically 2 (two) times daily.     Marland Kitchen diltiazem (CARDIZEM CD) 180 MG 24 hr capsule Take 180 mg by mouth daily.     Marland Kitchen donepezil (ARICEPT) 10 MG tablet Take 1 tablet (10 mg total) by mouth daily. 30 tablet 3  . Fluticasone-Salmeterol (ADVAIR) 250-50 MCG/DOSE AEPB Inhale  1 puff into the lungs 2 (two) times daily.    Marland Kitchen guaifenesin (ROBITUSSIN) 100 MG/5ML syrup Take 100 mg by mouth 2 (two) times daily as needed for cough.    . hydrALAZINE (APRESOLINE) 10 MG tablet Take 10 mg by mouth 3 (three) times daily.    Marland Kitchen lamoTRIgine (LAMICTAL) 100 MG tablet Take 100 mg by mouth 2 (two) times daily.    Marland Kitchen latanoprost (XALATAN) 0.005 % ophthalmic solution Place 1 drop into both eyes at bedtime.     . nicotine (NICODERM CQ - DOSED IN MG/24 HR) 7 mg/24hr patch Place 1 patch (7 mg total) onto the skin daily. 15 patch 0  . ondansetron (ZOFRAN) 4 MG tablet Take 1 tablet (4 mg total) by mouth every 8 (eight) hours as needed for nausea or vomiting. 20 tablet 0  . phenylephrine-shark liver oil-mineral oil-petrolatum (PREPARATION H) 0.25-3-14-71.9 % rectal ointment Place 1 application rectally 2 (two) times daily as needed for hemorrhoids.    . polyethylene glycol (MIRALAX / GLYCOLAX) packet Take 17 g by mouth daily as needed for mild constipation.    . predniSONE (DELTASONE) 10 MG tablet Take 30 mg by mouth every other day.    . pyridostigmine (MESTINON) 60 MG tablet Take 60 mg by mouth 3 (three) times daily.     . QUEtiapine (SEROQUEL) 200 MG tablet Take 1 tablet (200 mg total) by mouth at bedtime. 30 tablet 3  . sodium chloride (OCEAN) 0.65 % SOLN nasal spray Place 2 sprays into both nostrils daily as needed for congestion.     . traMADol (ULTRAM) 50 MG tablet Take 50 mg by mouth 2 (two) times daily.    . traZODone (DESYREL) 50 MG tablet Take 1 tablet (50 mg total) by mouth at bedtime. Take 1/2 at bedtime. (Patient taking differently: Take 50 mg by mouth at bedtime. ) 30 tablet 2    Results for orders placed or performed during the hospital encounter of 05/20/15 (from the past 48 hour(s))  Troponin I     Status: Abnormal   Collection Time: 05/19/15 10:30 PM  Result Value Ref Range   Troponin I 0.07 (H) <0.031 ng/mL    Comment: READ BACK AND VERIFIED WITH EMMA EGWUATU AT 2224  05/20/15.PMH        PERSISTENTLY INCREASED TROPONIN VALUES IN THE RANGE OF 0.04-0.49 ng/mL CAN BE SEEN IN:       -UNSTABLE ANGINA       -CONGESTIVE HEART FAILURE       -MYOCARDITIS       -CHEST TRAUMA       -ARRYHTHMIAS       -LATE PRESENTING MYOCARDIAL INFARCTION       -  COPD   CLINICAL FOLLOW-UP RECOMMENDED.   Basic metabolic panel     Status: Abnormal   Collection Time: 05/20/15  8:42 PM  Result Value Ref Range   Sodium 138 135 - 145 mmol/L   Potassium 4.7 3.5 - 5.1 mmol/L   Chloride 106 101 - 111 mmol/L   CO2 24 22 - 32 mmol/L   Glucose, Bld 100 (H) 65 - 99 mg/dL   BUN 26 (H) 6 - 20 mg/dL   Creatinine, Ser 1.44 (H) 0.44 - 1.00 mg/dL   Calcium 8.6 (L) 8.9 - 10.3 mg/dL   GFR calc non Af Amer 36 (L) >60 mL/min   GFR calc Af Amer 41 (L) >60 mL/min    Comment: (NOTE) The eGFR has been calculated using the CKD EPI equation. This calculation has not been validated in all clinical situations. eGFR's persistently <60 mL/min signify possible Chronic Kidney Disease.    Anion gap 8 5 - 15  CBC     Status: Abnormal   Collection Time: 05/20/15  8:42 PM  Result Value Ref Range   WBC 13.6 (H) 3.6 - 11.0 K/uL   RBC 3.47 (L) 3.80 - 5.20 MIL/uL   Hemoglobin 7.0 (L) 12.0 - 16.0 g/dL   HCT 24.0 (L) 35.0 - 47.0 %   MCV 69.1 (L) 80.0 - 100.0 fL   MCH 20.2 (L) 26.0 - 34.0 pg   MCHC 29.2 (L) 32.0 - 36.0 g/dL   RDW 20.2 (H) 11.5 - 14.5 %   Platelets 219 150 - 440 K/uL  Glucose, capillary     Status: Abnormal   Collection Time: 05/20/15  9:09 PM  Result Value Ref Range   Glucose-Capillary 100 (H) 65 - 99 mg/dL  Urinalysis complete, with microscopic ([facility] only)     Status: Abnormal   Collection Time: 05/20/15  9:23 PM  Result Value Ref Range   Color, Urine YELLOW (A) YELLOW   APPearance HAZY (A) CLEAR   Glucose, UA NEGATIVE NEGATIVE mg/dL   Bilirubin Urine NEGATIVE NEGATIVE   Ketones, ur NEGATIVE NEGATIVE mg/dL   Specific Gravity, Urine 1.013 1.005 - 1.030   Hgb urine dipstick 3+ (A)  NEGATIVE   pH 6.0 5.0 - 8.0   Protein, ur >500 (A) NEGATIVE mg/dL   Nitrite NEGATIVE NEGATIVE   Leukocytes, UA 3+ (A) NEGATIVE   RBC / HPF 0-5 0 - 5 RBC/hpf   WBC, UA TOO NUMEROUS TO COUNT 0 - 5 WBC/hpf   Bacteria, UA RARE (A) NONE SEEN   Squamous Epithelial / LPF 6-30 (A) NONE SEEN   Trans Epithel, UA 1    Mucous PRESENT   Type and screen     Status: None (Preliminary result)   Collection Time: 05/20/15 10:45 PM  Result Value Ref Range   ABO/RH(D) O POS    Antibody Screen NEG    Sample Expiration 05/23/2015    Unit Number [id number]    Blood Component Type RBC, LR IRR    Unit division 00    Status of Unit ALLOCATED    Transfusion Status OK TO TRANSFUSE    Crossmatch Result Compatible    Unit Number [id number]    Blood Component Type RCLI PHER 1    Unit division 00    Status of Unit ISSUED    Transfusion Status OK TO TRANSFUSE    Crossmatch Result Compatible   Prepare RBC     Status: None   Collection Time: 05/20/15 10:45 PM  Result Value Ref Range  Order Confirmation ORDER PROCESSED BY BLOOD BANK   ABO/Rh     Status: None   Collection Time: 05/20/15 10:45 PM  Result Value Ref Range   ABO/RH(D) O POS    Ct Abdomen Pelvis Wo Contrast  05/21/2015   CLINICAL DATA:  Diffuse primarily lower abdominal pain with GI bleed and melena. Increased weakness.  EXAM: CT ABDOMEN AND PELVIS WITHOUT CONTRAST  TECHNIQUE: Multidetector CT imaging of the abdomen and pelvis was performed following the standard protocol without IV contrast.  COMPARISON:  Abdominal ultrasound 12/08/2014  FINDINGS: Lower chest: Small bilateral pleural effusions. Ill-defined partially ground-glass opacity in the left lower lobe, only partially included in indeterminate. Mild atelectasis in both lower lobes.  Liver: Normal in size. Left lobe hepatic cyst measures 1 cm in the region of the porta hepatis.  Hepatobiliary: Gallbladder is physiologically distended. The calcifications in the gallbladder wall on prior  ultrasound are not well seen on CT. No calcified gallstones. No biliary dilatation.  Pancreas: No ductal dilatation or surrounding inflammation.  Spleen: Normal.  Adrenal glands: No nodule. Mild left adrenal thickening without discrete nodule.  Kidneys: Atrophic left kidney with compensatory hypertrophy of the right kidney. Lower pole cyst measures 2.8 cm in the right kidney, the complexity and ultrasound is not well seen by CT. Cyst in the upper pole measures 3.1 cm. This cyst in the left kidney on prior ultrasound is not well seen. No hydronephrosis.  Stomach/Bowel: Moderately large hiatal hernia. Stomach physiologically distended. There are no dilated or thickened small bowel loops. No bowel dilatation. Diverticulosis throughout the distal colon without diverticulitis. Small volume of stool throughout the colon without colonic wall thickening. The appendix is normal.  Vascular/Lymphatic: No retroperitoneal adenopathy. Fusiform dilatation of the distal descending and suprarenal abdominal aorta, maximal dimension 3.6 cm. Distal and mid aorta are tortuous, no aneurysmal dilatation of the infrarenal abdominal aorta. There is diffuse atheromatous calcifications. IVC appears narrowed.  Reproductive: Uterus is atrophic, normal for age. Ovaries are quiescent, normal for age. No adnexal mass.  Bladder: Physiologically distended without wall thickening.  Other: No free air, free fluid, or intra-abdominal fluid collection. Fat within both inguinal canals.  Musculoskeletal: There are no acute or suspicious osseous abnormalities. Scoliotic curvature in the spine with associated degenerative change. Compression deformities of L1 and L2 appear chronic.  IMPRESSION: 1. Diverticulosis throughout the colon without diverticulitis. 2. Moderately large hiatal hernia. 3. Left renal atrophy with mild compensatory hypertrophy of the right kidney. There are right renal cysts, the complexity on prior ultrasound is not well appreciated.  Cystic lesion in the left kidney are not well appreciated. Recommend further characterization on a nonemergent basis with MRI, with contrast if renal function permits. 4. 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 Recommend followup by ultrasound in 2 years. This recommendation follows ACR consensus guidelines: White Paper of the ACR Incidental Findings Committee II on Vascular Findings. J Am Coll Radiol 2013; 10:789-794. 5. Ill-defined opacity in the left lower lobe, only partially included in the field of view. This may be infectious or inflammatory, however recommend nonemergent chest CT for further characterization as this was not seen radiographically.   Electronically Signed   By: Jeb Levering M.D.   On: 05/21/2015 02:10   Dg Chest 2 View  05/20/2015   CLINICAL DATA:  Shortness of breath.  EXAM: CHEST  2 VIEW  COMPARISON:  April 25, 2015.  FINDINGS: Stable cardiomediastinal silhouette. Stable hiatal hernia. No pneumothorax  is noted. Minimal bilateral pleural effusions are noted. No acute pulmonary disease is noted. Stable calcified granuloma is noted in the left upper lobe. The visualized skeletal structures are unremarkable.  IMPRESSION: Minimal bilateral pleural effusions.  Stable hiatal hernia.   Electronically Signed   By: Marijo Conception, M.D.   On: 05/20/2015 20:17    Review of Systems  Constitutional: Negative for fever and chills.  HENT: Negative for sore throat and tinnitus.   Eyes: Negative for blurred vision and redness.  Respiratory: Positive for shortness of breath. Negative for cough.   Cardiovascular: Positive for chest pain. Negative for palpitations, orthopnea and PND.  Gastrointestinal: Positive for nausea, vomiting and melena. Negative for abdominal pain and diarrhea.  Genitourinary: Negative for dysuria, urgency and frequency.  Musculoskeletal: Negative for myalgias and joint pain.  Skin: Negative for  rash.       No lesions  Neurological: Positive for weakness. Negative for speech change and focal weakness.  Endo/Heme/Allergies: Does not bruise/bleed easily.       No temperature intolerance  Psychiatric/Behavioral: Negative for depression and suicidal ideas.    Blood pressure 164/68, pulse 92, temperature 98.5 F (36.9 C), temperature source Oral, resp. rate 18, height 5' 4"  (1.626 m), weight 59.104 kg (130 lb 4.8 oz), SpO2 94 %. Physical Exam  Vitals reviewed. Constitutional: She is oriented to person, place, and time. She appears well-developed and well-nourished. No distress.  HENT:  Head: Normocephalic and atraumatic.  Mouth/Throat: Oropharynx is clear and moist.  Eyes: Conjunctivae and EOM are normal. Pupils are equal, round, and reactive to light. No scleral icterus.  The patient is wearing her sunglasses  Neck: Normal range of motion. Neck supple. No JVD present. No tracheal deviation present. No thyromegaly present.  Cardiovascular: Normal rate, regular rhythm and normal heart sounds.  Exam reveals no gallop and no friction rub.   No murmur heard. Respiratory: Effort normal and breath sounds normal.  GI: Soft. Bowel sounds are normal. She exhibits no distension. There is tenderness. There is no rebound and no guarding.  Genitourinary:  Deferred  Musculoskeletal: Normal range of motion. She exhibits no edema.  Lymphadenopathy:    She has no cervical adenopathy.  Neurological: She is alert and oriented to person, place, and time. No cranial nerve deficit. She exhibits normal muscle tone.  Skin: Skin is warm and dry.  Psychiatric: She has a normal mood and affect. Her behavior is normal. Judgment and thought content normal.     Assessment/Plan This is a 69 year old Caucasian female admitted for anemia and melanotic stools. 1. Anemia: Possibly secondary to slow GI bleed; the patient started to receive a blood transfusion in the emergency department. She is hemodynamically  stable. Reported melanotic stools need GI evaluation. Consult placed to gastroenterology. 2. Chest pain: Unclear if this complaint is accurate as the patient is a poor historian. However, I will continue to follow the patient's cardiac enzymes as we had on her previous admission. Cardiology consultation at the discretion of the primary team. 3. Urinary tract infection: Discharged this morning with ciprofloxacin. Continue to complete a 14 day course. 4. Myasthenia gravis: Continue pyridostigmine and prednisone 5. Atrial fibrillation: continue diltiazem CR 6. Hypertension: Controlled; continue hydralazine.  7. COPD: Continue inhaled corticosteroid; albuterol as needed.  8. Chronic kidney disease: Stage IV; hydrate with intravenous fluid and avoid nephrotoxic agents (discontinued Voltaren gel) 9. Dementia: Continue Aricept, Lamictal, Seroquel and trazodone  10. DVT prophylaxis: Heparin 11. GI prophylaxis: pantoprazole iv The patient is full  code. Time spent on admission orders and patient care approximately 35 minutes  Harrie Foreman 05/21/2015, 5:04 AM

## 2015-05-21 NOTE — ED Provider Notes (Signed)
-----------------------------------------   2:28 AM on 05/21/2015 -----------------------------------------   Blood pressure 169/101, pulse 93, temperature 98.4 F (36.9 C), temperature source Oral, resp. rate 19, height 5\' 4"  (1.626 m), weight 126 lb (57.153 kg), SpO2 95 %.  Assuming care from Dr. Clearnce Hasten.  In short, Mackenzie Rose is a 69 y.o. female with a chief complaint of Weakness; Shortness of Breath; and Back Pain .  Refer to the original H&P for additional details.  The current plan of care is to follow up the results of the CT Scan.  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  Will admit to hospital service.    Loney Hering, MD 05/21/15 0230

## 2015-05-21 NOTE — ED Notes (Signed)
MD notified of increase in BP

## 2015-05-21 NOTE — Evaluation (Signed)
Physical Therapy Evaluation Patient Details Name: Mackenzie Rose MRN: [id number] DOB: 01-06-1946 Today's Date: 05/21/2015   History of Present Illness  Pt just discharged from hospital and re-admitted within 1 day. Pt with history of multiple falls and complains of nausea/vomiting. Pt with history of Afib, HTN, COPD, melanoma, and dementia. Pt lives at [place]. Pt just finished receiving blood transfusion this date.  Clinical Impression  Pt is a pleasant 69 year old female who was admitted for nausea/vomiting. Pt performs bed mobility, transfers, and ambulation with supervision and rw. Pt demonstrates deficits with endurance/safety/functional mobility. Would benefit from skilled PT to address above deficits and promote optimal return to PLOF.       Follow Up Recommendations Home health PT    Equipment Recommendations  None recommended by PT    Recommendations for Other Services       Precautions / Restrictions Precautions Precautions: Fall Restrictions Weight Bearing Restrictions: No      Mobility  Bed Mobility Overal bed mobility: Modified Independent Bed Mobility: Supine to Sit     Supine to sit: Supervision     General bed mobility comments: bed mobility performed with safe technique. Pt performs mobility at fast speed and then complains of being dizzy.  Transfers Overall transfer level: Modified independent Equipment used: Rolling walker (2 wheeled) Transfers: Sit to/from Stand Sit to Stand: Modified independent (Device/Increase time)         General transfer comment: sit<>Stand with rw and safe technique. No complaints of dizziness at this time.  Ambulation/Gait Ambulation/Gait assistance: Supervision Ambulation Distance (Feet): 80 Feet Assistive device: Rolling walker (2 wheeled) Gait Pattern/deviations: Step-through pattern     General Gait Details: no reports of dizziness. Safe technique performed. Somewhat self limiting. Good speed and no  fatigue noted post ambulation. Reciprocal gait pattern performed  Stairs            Wheelchair Mobility    Modified Rankin (Stroke Patients Only)       Balance Overall balance assessment: Needs assistance Sitting-balance support: Single extremity supported Sitting balance-Leahy Scale: Good     Standing balance support: Single extremity supported Standing balance-Leahy Scale: Fair                               Pertinent Vitals/Pain Pain Assessment: No/denies pain    Home Living Family/patient expects to be discharged to:: [place]:  (has "circular rolling rw")      Prior Function Level of Independence: Independent with assistive device(s)      ADL's / Homemaking Assistance Needed: Assist with bathing        Hand Dominance        Extremity/Trunk Assessment   Upper Extremity Assessment: Overall WFL for tasks assessed           Lower Extremity Assessment: Overall WFL for tasks assessed      Cervical / Trunk Assessment: Kyphotic  Communication   Communication: No difficulties  Cognition Arousal/Alertness: Awake/alert Behavior During Therapy: WFL for tasks assessed/performed Overall Cognitive Status: Within Functional Limits for tasks assessed                      General Comments      Exercises Other Exercises Other Exercises: seated ther-ex performed including B LE LAQ, scap squeezes, bicep curls, ankle pumps, and shoulder raises.  All ther-ex performed x 10 reps with cga.       Assessment/Plan    PT Assessment Patient needs continued PT services  PT Diagnosis Difficulty walking;Generalized weakness   PT Problem List Decreased strength;Decreased activity tolerance;Decreased balance;Decreased safety awareness  PT Treatment Interventions DME instruction;Gait training;Stair training;Therapeutic activities;Therapeutic exercise;Balance training;Neuromuscular re-education    PT Goals (Current goals can be found in the Care Plan section) Acute Rehab PT Goals Patient Stated Goal: to walk to chair PT Goal Formulation: With patient Time For Goal Achievement: 06/04/15 Potential to Achieve Goals: Good    Frequency Min 2X/week   Barriers to discharge        Co-evaluation               End of Session Equipment Utilized During Treatment: Gait belt Activity Tolerance: Patient tolerated treatment well Patient left: in chair;with chair alarm set Nurse Communication: Mobility status         Time: 2774-1287 PT Time Calculation (min) (ACUTE ONLY): 19 min   Charges:   PT Evaluation $Initial PT Evaluation Tier I: 1 Procedure PT Treatments $Therapeutic Exercise: 8-22 mins   PT G Codes:        Dashanna Kinnamon 05/24/15, 11:42 AM  Greggory Stallion, PT, DPT [phone]

## 2015-05-21 NOTE — ED Notes (Signed)
Patient transported to CT 

## 2015-05-22 ENCOUNTER — Encounter: Payer: Self-pay | Admitting: Anesthesiology

## 2015-05-22 ENCOUNTER — Inpatient Hospital Stay: Payer: Medicare Other | Admitting: Anesthesiology

## 2015-05-22 ENCOUNTER — Encounter
Admission: EM | Disposition: A | Discharge status: Home / Self Care | Payer: Self-pay | Source: Home / Self Care | Attending: Internal Medicine

## 2015-05-22 DIAGNOSIS — K921 Melena: Secondary | ICD-10-CM | POA: Diagnosis not present

## 2015-05-22 HISTORY — PX: ESOPHAGOGASTRODUODENOSCOPY (EGD) WITH PROPOFOL: SHX5813

## 2015-05-22 LAB — BASIC METABOLIC PANEL
ANION GAP: 7 (ref 5–15)
BUN: 20 mg/dL (ref 6–20)
CALCIUM: 8.2 mg/dL — AB (ref 8.9–10.3)
CO2: 27 mmol/L (ref 22–32)
Chloride: 105 mmol/L (ref 101–111)
Creatinine, Ser: 1.19 mg/dL — ABNORMAL HIGH (ref 0.44–1.00)
GFR, EST AFRICAN AMERICAN: 52 mL/min — AB (ref >60)
GFR, EST NON AFRICAN AMERICAN: 45 mL/min — AB (ref >60)
Glucose, Bld: 97 mg/dL (ref 65–99)
POTASSIUM: 3.6 mmol/L (ref 3.5–5.1)
SODIUM: 139 mmol/L (ref 135–145)

## 2015-05-22 LAB — URINE CULTURE

## 2015-05-22 LAB — CBC
HCT: 28.5 % — ABNORMAL LOW (ref 35.0–47.0)
Hemoglobin: 8.8 g/dL — ABNORMAL LOW (ref 12.0–16.0)
MCH: 22.5 pg — ABNORMAL LOW (ref 26.0–34.0)
MCHC: 30.8 g/dL — ABNORMAL LOW (ref 32.0–36.0)
MCV: 73 fL — AB (ref 80.0–100.0)
PLATELETS: 169 10*3/uL (ref 150–440)
RBC: 3.91 MIL/uL (ref 3.80–5.20)
RDW: 20.8 % — AB (ref 11.5–14.5)
WBC: 7.8 10*3/uL (ref 3.6–11.0)

## 2015-05-22 SURGERY — ESOPHAGOGASTRODUODENOSCOPY (EGD) WITH PROPOFOL
Anesthesia: General

## 2015-05-22 MED ORDER — SODIUM CHLORIDE 0.9 % IV SOLN: INTRAVENOUS | Status: DC | PRN

## 2015-05-22 MED ORDER — LIDOCAINE HCL (CARDIAC) 20 MG/ML IV SOLN
INTRAVENOUS | Status: DC | PRN
  Administered 2015-05-22: 60 mg via INTRAVENOUS

## 2015-05-22 MED ORDER — INFLUENZA VAC SPLIT QUAD 0.5 ML IM SUSY
0.5000 mL | PREFILLED_SYRINGE | INTRAMUSCULAR | Status: AC
Start: 2015-05-22 — End: 2015-05-22
  Administered 2015-05-22: 0.5 mL via INTRAMUSCULAR
  Filled 2015-05-22 (×2): qty 0.5

## 2015-05-22 MED ORDER — MIDAZOLAM HCL 2 MG/2ML IJ SOLN
INTRAMUSCULAR | Status: DC | PRN
  Administered 2015-05-22: 1 mg via INTRAVENOUS

## 2015-05-22 MED ORDER — SODIUM CHLORIDE 0.9 % IV SOLN
INTRAVENOUS | Status: DC
  Administered 2015-05-22: 1000 mL via INTRAVENOUS

## 2015-05-22 MED ORDER — PROPOFOL 10 MG/ML IV BOLUS
INTRAVENOUS | Status: DC | PRN
  Administered 2015-05-22: 50 mg via INTRAVENOUS

## 2015-05-22 MED ORDER — SODIUM CHLORIDE 0.9 % IV SOLN
INTRAVENOUS | Status: DC | PRN
  Administered 2015-05-22: 09:00:00 via INTRAVENOUS

## 2015-05-22 NOTE — Transfer of Care (Signed)
Immediate Anesthesia Transfer of Care Note  Patient: Mackenzie Rose  Procedure(s) Performed: Procedure(s): ESOPHAGOGASTRODUODENOSCOPY (EGD) WITH PROPOFOL (N/A)  Patient Location: Endoscopy Unit  Anesthesia Type:General  Level of Consciousness: awake, alert , oriented and patient cooperative  Airway & Oxygen Therapy: Patient Spontanous Breathing and Patient connected to nasal cannula oxygen  Post-op Assessment: Report given to RN, Post -op Vital signs reviewed and stable and Patient moving all extremities X 4  Post vital signs: Reviewed and stable  Last Vitals:  Filed Vitals:   05/22/15 0946  BP: 135/70  Pulse: 70  Temp: 36.5 C  Resp: 10    Complications: No apparent anesthesia complications

## 2015-05-22 NOTE — Op Note (Signed)
[facility] Patient Name: Mackenzie Rose Procedure Date: 05/22/2015 9:25 AM MRN: [id number] Account #: [id number] Date of Birth: 08-01-1946 Admit Type: Inpatient Age: 69 Room: [facility] ENDO ROOM 4 Gender: Female Note Status: Finalized Procedure:         Upper GI endoscopy Indications:       Iron deficiency anemia, Melena Providers:         Lucilla Lame, MD Referring MD:      Lavera Guise, MD (Referring MD) Medicines:         Propofol per Anesthesia Complications:     No immediate complications. Procedure:         Pre-Anesthesia Assessment:                    - Prior to the procedure, a History and Physical was                     performed, and patient medications and allergies were                     reviewed. The patient's tolerance of previous anesthesia                     was also reviewed. The risks and benefits of the procedure                     and the sedation options and risks were discussed with the                     patient. All questions were answered, and informed consent                     was obtained. Prior Anticoagulants: The patient has taken                     no previous anticoagulant or antiplatelet agents. ASA                     Grade Assessment: II - A patient with mild systemic                     disease. After reviewing the risks and benefits, the                     patient was deemed in satisfactory condition to undergo                     the procedure.                    After obtaining informed consent, the endoscope was passed                     under direct vision. Throughout the procedure, the                     patient's blood pressure, pulse, and oxygen saturations                     were monitored continuously. The Olympus GIF-160 endoscope                     (S#. [id number]) was introduced through the mouth, and  advanced to the second part of duodenum. The upper GI    endoscopy was accomplished without difficulty. The patient                     tolerated the procedure well. Findings:      A large hiatus hernia was present.      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 The       proximal extent of the gastric folds (end of tubular esophagus) was in       the cardia.      Localized mild inflammation characterized by erythema was found in the       duodenal bulb. Impression:        - Large hiatus hernia.                    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                    - Duodenitis.                    - No specimens collected. Recommendation:    - Use a proton pump inhibitor PO daily. Procedure Code(s): --- Professional ---                    [phone], Esophagogastroduodenoscopy, flexible, transoral;                     diagnostic, including collection of specimen(s) by                     brushing or washing, when performed (separate procedure) Diagnosis Code(s): --- Professional ---                    D50.9, Iron deficiency anemia, unspecified                    K44.9, Diaphragmatic hernia without obstruction or gangrene                    K25.9, Gastric ulcer, unspecified as acute or chronic,                     without hemorrhage or perforation                    K29.80, Duodenitis without bleeding                    K92.1, Melena CPT copyright 2014 American Medical Association. All rights reserved. The codes documented in this report are preliminary and upon coder review may  be revised to meet current compliance requirements. Lucilla Lame, MD 05/22/2015 9:41:16 AM This report has been signed electronically. Number of Addenda: 0 Note Initiated On: 05/22/2015 9:25 AM      [facility]

## 2015-05-22 NOTE — Clinical Documentation Improvement (Signed)
Internal Medicine  Can the diagnosis of "anemia in other chronic diseases " be further specified? Thank you   Acute on chronic blood loss anemia  Acute blood loss anemia  Other  Clinically Undetermined   Supporting Information:Hgb 5.9 on day of discharge per lab values, 7 on day of readmit, transfused 2 units PRBC up to 10 after transfusion  Then down to 8.8.  Thank you   Please exercise your independent, professional judgment when responding. A specific answer is not anticipated or expected.   Thank You,  [place] [phone]

## 2015-05-22 NOTE — Progress Notes (Signed)
[facility] [place] NAME: Mackenzie Rose    MR#:  [id number]  DATE OF BIRTH:  03-10-1946  SUBJECTIVE:  CHIEF COMPLAINT:   Chief Complaint  Patient presents with  . Weakness  . Shortness of Breath  . Back Pain   -Admitted for anemia, no active bleeding. -Status post EGD today showing hiatal hernia with [name] ulcers. -No nausea or vomiting.  REVIEW OF SYSTEMS:  Review of Systems  Constitutional: Negative for fever and chills.  HENT: Negative for ear discharge, ear pain and tinnitus.   Eyes: Negative for blurred vision and double vision.       Poor visual acuity  Respiratory: Negative for cough, shortness of breath and wheezing.   Cardiovascular: Negative for chest pain and palpitations.  Gastrointestinal: Negative for nausea, vomiting, abdominal pain, diarrhea and constipation.  Genitourinary: Negative for dysuria.  Neurological: Positive for weakness. Negative for dizziness, tremors, sensory change, speech change, seizures and headaches.  Psychiatric/Behavioral: Negative for depression. The patient is not nervous/anxious.     DRUG ALLERGIES:   Allergies  Allergen Reactions  . Lactose Intolerance (Gi) Other (See Comments)    Reaction:  GI upset   . Lithium Other (See Comments)    Reaction:  Unknown   . Penicillins Rash    VITALS:  Blood pressure 177/93, pulse 88, temperature 98.4 F (36.9 C), temperature source Oral, resp. rate 16, height 5\' 4"  (1.626 m), weight 59.285 kg (130 lb 11.2 oz), SpO2 100 %.  PHYSICAL EXAMINATION:  Physical Exam  GENERAL: 69 y.o.-year-old patient lying in the bed with no acute distress.  EYES: Pupils equal, round, reactive to light and accommodation. No scleral icterus. Extraocular muscles intact. Poor visual acuity. HEENT: Head atraumatic, normocephalic. Oropharynx and nasopharynx clear. stapels + NECK: Supple, no jugular venous distention. No thyroid enlargement, no tenderness.   LUNGS: Normal breath sounds bilaterally, no wheezing, rales,rhonchi or crepitation. No use of accessory muscles of respiration. Decreased bibasilar breath sounds. CARDIOVASCULAR: S1, S2 normal. No rubs, or gallops. 3/6 systolic murmur present. ABDOMEN: Soft, non-tender, non-distended. Bowel sounds present. No organomegaly or mass.  EXTREMITIES: No pedal edema, cyanosis, or clubbing.  NEUROLOGIC: Cranial nerves II through XII are intact. Muscle strength 5/5 in all extremities. Sensation intact. Gait not checked.  PSYCHIATRIC: The patient is alert and oriented x 3.  SKIN: No obvious rash, lesion, or ulcer.   LABORATORY PANEL:   CBC  Recent Labs Lab 05/22/15 0519  WBC 7.8  HGB 8.8*  HCT 28.5*  PLT 169   ------------------------------------------------------------------------------------------------------------------  Chemistries   Recent Labs Lab 05/16/15 2323  05/22/15 0519  NA 137  < > 139  K 2.6*  < > 3.6  CL 101  < > 105  CO2 24  < > 27  GLUCOSE 234*  < > 97  BUN 18  < > 20  CREATININE 1.77*  < > 1.19*  CALCIUM 8.4*  < > 8.2*  AST 32  --   --   ALT 16  --   --   ALKPHOS 84  --   --   BILITOT 0.2*  --   --   < > = values in this interval not displayed. ------------------------------------------------------------------------------------------------------------------  Cardiac Enzymes  Recent Labs Lab 05/21/15 1625  TROPONINI 0.06*   ------------------------------------------------------------------------------------------------------------------  RADIOLOGY:  Ct Abdomen Pelvis Wo Contrast  05/21/2015   CLINICAL DATA:  Diffuse primarily lower abdominal pain with GI bleed and melena. Increased weakness.  EXAM: CT ABDOMEN  AND PELVIS WITHOUT CONTRAST  TECHNIQUE: Multidetector CT imaging of the abdomen and pelvis was performed following the standard protocol without IV contrast.  COMPARISON:  Abdominal ultrasound 12/08/2014  FINDINGS: Lower chest: Small bilateral  pleural effusions. Ill-defined partially ground-glass opacity in the left lower lobe, only partially included in indeterminate. Mild atelectasis in both lower lobes.  Liver: Normal in size. Left lobe hepatic cyst measures 1 cm in the region of the porta hepatis.  Hepatobiliary: Gallbladder is physiologically distended. The calcifications in the gallbladder wall on prior ultrasound are not well seen on CT. No calcified gallstones. No biliary dilatation.  Pancreas: No ductal dilatation or surrounding inflammation.  Spleen: Normal.  Adrenal glands: No nodule. Mild left adrenal thickening without discrete nodule.  Kidneys: Atrophic left kidney with compensatory hypertrophy of the right kidney. Lower pole cyst measures 2.8 cm in the right kidney, the complexity and ultrasound is not well seen by CT. Cyst in the upper pole measures 3.1 cm. This cyst in the left kidney on prior ultrasound is not well seen. No hydronephrosis.  Stomach/Bowel: Moderately large hiatal hernia. Stomach physiologically distended. There are no dilated or thickened small bowel loops. No bowel dilatation. Diverticulosis throughout the distal colon without diverticulitis. Small volume of stool throughout the colon without colonic wall thickening. The appendix is normal.  Vascular/Lymphatic: No retroperitoneal adenopathy. Fusiform dilatation of the distal descending and suprarenal abdominal aorta, maximal dimension 3.6 cm. Distal and mid aorta are tortuous, no aneurysmal dilatation of the infrarenal abdominal aorta. There is diffuse atheromatous calcifications. IVC appears narrowed.  Reproductive: Uterus is atrophic, normal for age. Ovaries are quiescent, normal for age. No adnexal mass.  Bladder: Physiologically distended without wall thickening.  Other: No free air, free fluid, or intra-abdominal fluid collection. Fat within both inguinal canals.  Musculoskeletal: There are no acute or suspicious osseous abnormalities. Scoliotic curvature in the  spine with associated degenerative change. Compression deformities of L1 and L2 appear chronic.  IMPRESSION: 1. Diverticulosis throughout the colon without diverticulitis. 2. Moderately large hiatal hernia. 3. Left renal atrophy with mild compensatory hypertrophy of the right kidney. There are right renal cysts, the complexity on prior ultrasound is not well appreciated. Cystic lesion in the left kidney are not well appreciated. Recommend further characterization on a nonemergent basis with MRI, with contrast if renal function permits. 4. 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 Recommend followup by ultrasound in 2 years. This recommendation follows ACR consensus guidelines: White Paper of the ACR Incidental Findings Committee II on Vascular Findings. J Am Coll Radiol 2013; 10:789-794. 5. Ill-defined opacity in the left lower lobe, only partially included in the field of view. This may be infectious or inflammatory, however recommend nonemergent chest CT for further characterization as this was not seen radiographically.   Electronically Signed   By: Jeb Levering M.D.   On: 05/21/2015 02:10   Dg Chest 2 View  05/20/2015   CLINICAL DATA:  Shortness of breath.  EXAM: CHEST  2 VIEW  COMPARISON:  April 25, 2015.  FINDINGS: Stable cardiomediastinal silhouette. Stable hiatal hernia. No pneumothorax is noted. Minimal bilateral pleural effusions are noted. No acute pulmonary disease is noted. Stable calcified granuloma is noted in the left upper lobe. The visualized skeletal structures are unremarkable.  IMPRESSION: Minimal bilateral pleural effusions.  Stable hiatal hernia.   Electronically Signed   By: Marijo Conception, M.D.   On: 05/20/2015 20:17    EKG:   Orders  placed or performed during the hospital encounter of 05/20/15  . ED EKG  . ED EKG  . EKG 12-Lead  . EKG 12-Lead    ASSESSMENT AND PLAN:   69 year old female with past  history significant for near syncope and myasthenia gravis admitted for syncope and elevated troponin.  1. Anemia-acute on chronic blood loss anemia. Has known history of anemia of chronic disease. -Iron tests with low serum iron. GI consult appreciated -Status post upper GI endoscopy today, showing large hiatal hernia and Cameron ulcers -Continue Protonix twice a day. -Baseline hemoglobin is around 8.5, was 5.9 prior to admission with transfusion increased up to 10 but back to baseline today at 8.8. Likely from dilution and no active bleeding noted. -Advance diet today  2. Myasthenia gravis: Continue pyridostigmine and prednisone -No acute changes.  3. Urinary tract infection: Continue her Cipro until 05/23/2015.  4. Anxiety-continue home medications  5. Atrial fibrillation: No indication of rapid ventricular rate or tachyarrhythmia. Continue no anticoagulation due to fall risk; continue diltiazem CR  6. Hypertension: Controlled; on Cardizem. Blood pressure was remained elevated. Added oral hydralazine.  7. COPD: Continue inhaled corticosteroid.   8. Dementia: Continue Aricept, Lamictal and sleep aids at night   Physical therapy consult recommending home health.    All the records are reviewed and case discussed with Care Management/Social Workerr. Management plans discussed with the patient, family and they are in agreement.  CODE STATUS: FULL CODE  TOTAL TIME TAKING CARE OF THIS PATIENT: 37 minutes.   POSSIBLE D/C TOMORROW, DEPENDING ON CLINICAL CONDITION.   Gladstone Lighter M.D on 05/22/2015 at 1:04 PM  Between 7am to 6pm - Pager - [phone]  After 6pm go to [email or web address] - password [email or web address] [facility]  Office  [phone]  CC: Primary care physician; Lavera Guise, MD

## 2015-05-22 NOTE — Progress Notes (Signed)
Pt currently off unit for EGD. Pt unavailable for therapy at this time. Will need new orders to initiate therapy if general anesthesia used. Will re-assess.  Greggory Stallion, PT, DPT [phone]

## 2015-05-22 NOTE — Anesthesia Preprocedure Evaluation (Addendum)
Anesthesia Evaluation  Patient identified by MRN, date of birth, ID band Patient awake    Reviewed: Allergy & Precautions, NPO status , Patient's Chart, lab work & pertinent test results  Airway Mallampati: II  TM Distance: >3 FB     Dental no notable dental hx.    Pulmonary COPD,  COPD inhaler, Current Smoker,    Pulmonary exam normal        Cardiovascular hypertension, Normal cardiovascular exam+ dysrhythmias Atrial Fibrillation      Neuro/Psych  Headaches, Anxiety Depression Bipolar Disorder Myasthenia gravis  Neuromuscular disease    GI/Hepatic negative GI ROS, Neg liver ROS,   Endo/Other  diabetes, Well Controlled, Type 2, Oral Hypoglycemic Agents  Renal/GU Renal InsufficiencyRenal disease  negative genitourinary   Musculoskeletal negative musculoskeletal ROS (+)   Abdominal Normal abdominal exam  (+)   Peds negative pediatric ROS (+)  Hematology  (+) anemia ,   Anesthesia Other Findings   Reproductive/Obstetrics                            Anesthesia Physical Anesthesia Plan  ASA: III  Anesthesia Plan: General   Post-op Pain Management:    Induction: Intravenous  Airway Management Planned: Nasal Cannula  Additional Equipment:   Intra-op Plan:   Post-operative Plan:   Informed Consent: I have reviewed the patients History and Physical, chart, labs and discussed the procedure including the risks, benefits and alternatives for the proposed anesthesia with the patient or authorized representative who has indicated his/her understanding and acceptance.   Dental advisory given  Plan Discussed with: CRNA and Surgeon  Anesthesia Plan Comments:         Anesthesia Quick Evaluation

## 2015-05-22 NOTE — Evaluation (Signed)
Physical Therapy Re-evaluation Patient Details Name: Mackenzie Rose MRN: [id number] DOB: 18-Sep-1945 Today's Date: 05/22/2015   History of Present Illness  Pt just discharged from hospital and re-admitted within 1 day. Pt with history of multiple falls and complains of nausea/vomiting. Pt with history of Afib, HTN, COPD, melanoma, and dementia. Pt lives at [place]. Pt just finished receiving blood transfusion. Pt had upper endoscopy on 05/22/15 that revealed hiatal hernia  Clinical Impression  Pt presents with heart disease, A-fib, DM, COPD, dementia, myasthenia gravis, and acute renal failure. Examination reveals that pt performs bed mobility at Mod I, transfers at Mod I, and ambulation at min assist. She is eager to work but needs to be slowed down at times. Pt had minor lightheadedness/diaphoretic event during session (vitals checked and nurse notified - reference transfers section). Pt does have some gait disturbances as well as activity tolerance deficits that will continue to benefit from skilled PT in order for her to return home safely. She is eager to participate in therapy and pleasant to work with.     Follow Up Recommendations Home health PT    Equipment Recommendations  None recommended by PT    Recommendations for Other Services       Precautions / Restrictions Precautions Precautions: Fall Restrictions Weight Bearing Restrictions: No      Mobility  Bed Mobility Overal bed mobility: Modified Independent Bed Mobility: Supine to Sit     Supine to sit: Supervision Sit to supine: Independent   General bed mobility comments: Pt has good use of hands to perform. Pt states feeling dizzy and faint when sitting up. Pt layed back into supine (BP in supine132/61). Pt was sat up after a rest and she had a bout of emesis (small amount and clear).   Transfers Overall transfer level: Modified independent Equipment used: Rolling walker (2 wheeled) Transfers: Sit to/from  Stand Sit to Stand: Modified independent (Device/Increase time)         General transfer comment: Pt performs transfers with good functional strength and no LOB   Ambulation/Gait Ambulation/Gait assistance: Min assist Ambulation Distance (Feet): 20 Feet Assistive device: Rolling walker (2 wheeled) Gait Pattern/deviations: Step-through pattern;Shuffle;Decreased step length - right;Decreased step length - left Gait velocity: decreased    General Gait Details: Pt staggering a bit and c/o SOB during ambulation. Pt wishing to sit back on bed secondary to SOB (O2 sats following ambulation were 95% and HR was 101). Pt states general fatigue with ambulation.   Stairs            Wheelchair Mobility    Modified Rankin (Stroke Patients Only)       Balance Overall balance assessment: History of Falls                                           Pertinent Vitals/Pain Pain Assessment: No/denies pain    Home Living Family/patient expects to be discharged to:: [place]:  (has circular RW)      Prior Function Level of Independence: Independent with assistive device(s)   Gait / Transfers Assistance Needed: No assistance  ADL's / Homemaking Assistance Needed: Assist with bathing        Hand Dominance        Extremity/Trunk Assessment   Upper Extremity Assessment:  Overall WFL for tasks assessed           Lower Extremity Assessment: Overall WFL for tasks assessed         Communication   Communication: No difficulties  Cognition Arousal/Alertness: Awake/alert Behavior During Therapy: WFL for tasks assessed/performed Overall Cognitive Status: Within Functional Limits for tasks assessed                      General Comments      Exercises Other Exercises Other Exercises: Pt performed bilateral therex x 12 reps at supervision for proper technique. Exercises performed: ankle pumps, quad  sets, glute sets, SLR, LAQ      Assessment/Plan    PT Assessment Patient needs continued PT services  PT Diagnosis Difficulty walking;Generalized weakness   PT Problem List Decreased strength;Decreased activity tolerance;Decreased balance;Decreased safety awareness  PT Treatment Interventions DME instruction;Gait training;Stair training;Functional mobility training;Therapeutic activities;Therapeutic exercise;Balance training;Neuromuscular re-education;Patient/family education;Wheelchair mobility training;Manual techniques;Modalities   PT Goals (Current goals can be found in the Care Plan section) Acute Rehab PT Goals Patient Stated Goal: to walk to chair PT Goal Formulation: With patient Time For Goal Achievement: 06/04/15 Potential to Achieve Goals: Good    Frequency Min 2X/week   Barriers to discharge        Co-evaluation               End of Session Equipment Utilized During Treatment: Gait belt Activity Tolerance: Patient limited by fatigue Patient left: in bed;with call bell/phone within reach;with bed alarm set Nurse Communication:  (Emesis episode )         Time: 2751-7001 PT Time Calculation (min) (ACUTE ONLY): 24 min   Charges:         PT G CodesJanyth Contes 06-04-2015, 4:55 PM  Janyth Contes, SPT. [phone]

## 2015-05-22 NOTE — Progress Notes (Signed)
CSW discussed with patient's son via phone- he is in [place]. Patient and son agree with plans to return to [place] years ALF at [facility] and resume [facility] services (including PT).  Spoke with Elvis Coil at [place] and she is agreeable to the return as well (tentatively for tomorrow). FL2 on chart for MD  Eduard Clos, MSW, [place]

## 2015-05-23 ENCOUNTER — Encounter: Payer: Self-pay | Admitting: Gastroenterology

## 2015-05-23 LAB — TYPE AND SCREEN
ABO/RH(D): O POS
ANTIBODY SCREEN: NEGATIVE
UNIT DIVISION: 0
UNIT DIVISION: 0

## 2015-05-23 LAB — CBC
HEMATOCRIT: 27.5 % — AB (ref 35.0–47.0)
HEMOGLOBIN: 8.5 g/dL — AB (ref 12.0–16.0)
MCH: 23 pg — AB (ref 26.0–34.0)
MCHC: 31 g/dL — AB (ref 32.0–36.0)
MCV: 74.2 fL — AB (ref 80.0–100.0)
Platelets: 150 10*3/uL (ref 150–440)
RBC: 3.7 MIL/uL — ABNORMAL LOW (ref 3.80–5.20)
RDW: 20.8 % — AB (ref 11.5–14.5)
WBC: 7.3 10*3/uL (ref 3.6–11.0)

## 2015-05-23 MED ORDER — FERROUS SULFATE 325 (65 FE) MG PO TABS
325.0000 mg | ORAL_TABLET | Freq: Two times a day (BID) | ORAL | Status: DC
  Administered 2015-05-23: 325 mg via ORAL
  Filled 2015-05-23: qty 1

## 2015-05-23 MED ORDER — HYDRALAZINE HCL 25 MG PO TABS: 25.0000 mg | ORAL_TABLET | Freq: Three times a day (TID) | ORAL | Status: DC

## 2015-05-23 MED ORDER — TRAMADOL HCL 50 MG PO TABS: 50.0000 mg | ORAL_TABLET | Freq: Two times a day (BID) | ORAL | Status: DC

## 2015-05-23 MED ORDER — HYDRALAZINE HCL 50 MG PO TABS: 50.0000 mg | ORAL_TABLET | Freq: Three times a day (TID) | ORAL | Status: AC

## 2015-05-23 MED ORDER — FERROUS SULFATE 325 (65 FE) MG PO TABS: 325.0000 mg | ORAL_TABLET | Freq: Two times a day (BID) | ORAL | Status: DC

## 2015-05-23 MED ORDER — TRAZODONE HCL 50 MG PO TABS: 50.0000 mg | ORAL_TABLET | Freq: Every day | ORAL | Status: AC

## 2015-05-23 MED ORDER — PANTOPRAZOLE SODIUM 40 MG PO TBEC: 40.0000 mg | DELAYED_RELEASE_TABLET | Freq: Two times a day (BID) | ORAL | Status: AC

## 2015-05-23 MED ORDER — HYDRALAZINE HCL 50 MG PO TABS
50.0000 mg | ORAL_TABLET | Freq: Three times a day (TID) | ORAL | Status: DC
  Administered 2015-05-23 (×2): 50 mg via ORAL
  Filled 2015-05-23 (×3): qty 1

## 2015-05-23 NOTE — Care Management Important Message (Signed)
Important Message  Patient Details  Name: Mackenzie Rose MRN: [id number] Date of Birth: [birth year]   Medicare Important Message Given:  Yes-third notification given    [name] 05/23/2015, 9:22 AM

## 2015-05-23 NOTE — Discharge Summary (Addendum)
[facility] [place] NAME: Mackenzie Rose    MR#:  [id number]  DATE OF BIRTH:  1945/12/02  DATE OF ADMISSION:  05/20/2015 ADMITTING PHYSICIAN: Harrie Foreman, MD  DATE OF DISCHARGE: 05/23/2015  PRIMARY CARE PHYSICIAN: Lavera Guise, MD    ADMISSION DIAGNOSIS:  Generalized abdominal pain [R10.84] Acute GI bleeding [K92.2] Symptomatic anemia [D64.9]  DISCHARGE DIAGNOSIS:  Active Problems:   Anemia   Acute GI bleeding   Symptomatic anemia   Blood in stool   SECONDARY DIAGNOSIS:   Past Medical History  Diagnosis Date  . Heart disease   . Atrial fibrillation     a. reported a-fib; b. unknown chronicity; c. dates back to 1970s; d. not on long term anticoagulation 2/2 no documented a-fib  . Hypertension   . High cholesterol   . Diabetes     borderline  . COPD (chronic obstructive pulmonary disease)   . Melanoma   . Kidney disorder   . Tremor   . Autoimmune disease   . Depressed   . Anxiety disorder   . Migraine   . Dementia   . Motion sickness   . Myasthenia gravis   . Bipolar disorder   . Dementia   . Acute renal failure   . Near syncope     HOSPITAL COURSE:  69 year old female with past history significant for near syncope and myasthenia gravis admitted for syncope and elevated troponin.  1. Anemia-acute on chronic blood loss anemia. Has known history of anemia of chronic disease. -Iron tests with low serum iron. GI consult appreciated -Status post upper GI endoscopy showing large hiatal hernia and Cameron ulcers -Continue Protonix twice a day. -Iron supplements added -Hemoglobin stable. Received 2 units of packed RBC transfusion this admission -Tolerating solid diet fine  2. Myasthenia gravis: Continue pyridostigmine and prednisone -No acute changes.  3. Urinary tract infection: On Cipro, will be discontinued at discharge today.  4. Anxiety-continue home medications  5. Atrial fibrillation: No  indication of rapid ventricular rate or tachyarrhythmia. Continue no anticoagulation due to fall risk; continue diltiazem CR  6. Hypertension: Controlled; on Cardizem. Blood pressure was remained elevated. Added oral hydralazine. Dose being increased at discharge.  7. COPD: Continue inhaled corticosteroid. Stable. Not on any home oxygen.  8. Dementia: Continue Aricept, Lamictal and sleep aids at night  She'll be discharged back to assisted living facility with home health services today.  DISCHARGE CONDITIONS:  Stable   CONSULTS OBTAINED:  Treatment Team:  Lucilla Lame, MD  DRUG ALLERGIES:   Allergies  Allergen Reactions  . Lactose Intolerance (Gi) Other (See Comments)    Reaction:  GI upset   . Lithium Other (See Comments)    Reaction:  Unknown   . Penicillins Rash    DISCHARGE MEDICATIONS:   Current Discharge Medication List    START taking these medications   Details  ferrous sulfate 325 (65 FE) MG tablet Take 1 tablet (325 mg total) by mouth 2 (two) times daily with a meal. Qty: 60 tablet, Refills: 3    pantoprazole (PROTONIX) 40 MG tablet Take 1 tablet (40 mg total) by mouth 2 (two) times daily before a meal. Qty: 60 tablet, Refills: 2      CONTINUE these medications which have CHANGED   Details  hydrALAZINE (APRESOLINE) 50 MG tablet Take 1 tablet (50 mg total) by mouth 3 (three) times daily. Qty: 90 tablet, Refills: 0    traMADol (ULTRAM) 50 MG tablet  Take 1 tablet (50 mg total) by mouth 2 (two) times daily. Qty: 30 tablet, Refills: 2    traZODone (DESYREL) 50 MG tablet Take 1 tablet (50 mg total) by mouth at bedtime. Qty: 30 tablet, Refills: 0      CONTINUE these medications which have NOT CHANGED   Details  acetaminophen (TYLENOL) 650 MG CR tablet Take 650 mg by mouth daily. Pt takes daily at 2pm and every four hours as needed for pain.    albuterol (PROVENTIL HFA;VENTOLIN HFA) 108 (90 BASE) MCG/ACT inhaler Inhale 2 puffs into the lungs every 4 (four)  hours as needed for wheezing or shortness of breath. Qty: 6.7 g, Refills: 1    bismuth subsalicylate (PEPTO BISMOL) 262 MG/15ML suspension Take 5 mLs by mouth 4 (four) times daily as needed for diarrhea or loose stools (or nausea).    busPIRone (BUSPAR) 10 MG tablet Take 10 mg by mouth 2 (two) times daily.     Dextromethorphan-Benzocaine (SORE THROAT & COUGH LOZENGES MT) Use as directed 1 lozenge in the mouth or throat 4 (four) times daily as needed (for cough).    diclofenac sodium (VOLTAREN) 1 % GEL Apply 2 g topically 2 (two) times daily.     diltiazem (CARDIZEM CD) 180 MG 24 hr capsule Take 180 mg by mouth daily.     donepezil (ARICEPT) 10 MG tablet Take 1 tablet (10 mg total) by mouth daily. Qty: 30 tablet, Refills: 3    Fluticasone-Salmeterol (ADVAIR) 250-50 MCG/DOSE AEPB Inhale 1 puff into the lungs 2 (two) times daily.    guaifenesin (ROBITUSSIN) 100 MG/5ML syrup Take 100 mg by mouth 2 (two) times daily as needed for cough.    lamoTRIgine (LAMICTAL) 100 MG tablet Take 100 mg by mouth 2 (two) times daily.    latanoprost (XALATAN) 0.005 % ophthalmic solution Place 1 drop into both eyes at bedtime.     nicotine (NICODERM CQ - DOSED IN MG/24 HR) 7 mg/24hr patch Place 1 patch (7 mg total) onto the skin daily. Qty: 15 patch, Refills: 0    ondansetron (ZOFRAN) 4 MG tablet Take 1 tablet (4 mg total) by mouth every 8 (eight) hours as needed for nausea or vomiting. Qty: 20 tablet, Refills: 0    phenylephrine-shark liver oil-mineral oil-petrolatum (PREPARATION H) 0.25-3-14-71.9 % rectal ointment Place 1 application rectally 2 (two) times daily as needed for hemorrhoids.    polyethylene glycol (MIRALAX / GLYCOLAX) packet Take 17 g by mouth daily as needed for mild constipation.    predniSONE (DELTASONE) 10 MG tablet Take 30 mg by mouth every other day.    pyridostigmine (MESTINON) 60 MG tablet Take 60 mg by mouth 3 (three) times daily.     QUEtiapine (SEROQUEL) 200 MG tablet Take 1  tablet (200 mg total) by mouth at bedtime. Qty: 30 tablet, Refills: 3    sodium chloride (OCEAN) 0.65 % SOLN nasal spray Place 2 sprays into both nostrils daily as needed for congestion.       STOP taking these medications     ciprofloxacin (CIPRO) 500 MG tablet          DISCHARGE INSTRUCTIONS:    1. GI follow up in 1-2 weeks 2. PCP follow-up in 1 week  If you experience worsening of your admission symptoms, develop shortness of breath, life threatening emergency, suicidal or homicidal thoughts you must seek medical attention immediately by calling 911 or calling your MD immediately  if symptoms less severe.  You Must read complete instructions/literature along  with all the possible adverse reactions/side effects for all the Medicines you take and that have been prescribed to you. Take any new Medicines after you have completely understood and accept all the possible adverse reactions/side effects.   Please note  You were cared for by a hospitalist during your hospital stay. If you have any questions about your discharge medications or the care you received while you were in the hospital after you are discharged, you can call the unit and asked to speak with the hospitalist on call if the hospitalist that took care of you is not available. Once you are discharged, your primary care physician will handle any further medical issues. Please note that NO REFILLS for any discharge medications will be authorized once you are discharged, as it is imperative that you return to your primary care physician (or establish a relationship with a primary care physician if you do not have one) for your aftercare needs so that they can reassess your need for medications and monitor your lab values.    Today   CHIEF COMPLAINT:   Chief Complaint  Patient presents with  . Weakness  . Shortness of Breath  . Back Pain    VITAL SIGNS:  Blood pressure 170/93, pulse 90, temperature 98.8 F (37.1 C),  temperature source Oral, resp. rate 18, height 5\' 4"  (1.626 m), weight 61.281 kg (135 lb 1.6 oz), SpO2 97 %.  I/O:   Intake/Output Summary (Last 24 hours) at 05/23/15 0912 Last data filed at 05/23/15 0453  Gross per 24 hour  Intake 1649.66 ml  Output      0 ml  Net 1649.66 ml    PHYSICAL EXAMINATION:   Physical Exam  GENERAL: 69 y.o.-year-old patient lying in the bed with no acute distress.  EYES: Pupils equal, round, reactive to light and accommodation. No scleral icterus. Extraocular muscles intact. Poor visual acuity. HEENT: Head atraumatic, normocephalic. Oropharynx and nasopharynx clear. stapels + on the back of the head NECK: Supple, no jugular venous distention. No thyroid enlargement, no tenderness.  LUNGS: Normal breath sounds bilaterally, no wheezing, rales,rhonchi or crepitation. No use of accessory muscles of respiration. Decreased bibasilar breath sounds. CARDIOVASCULAR: S1, S2 normal. No rubs, or gallops. 3/6 systolic murmur present. ABDOMEN: Soft, non-tender, non-distended. Bowel sounds present. No organomegaly or mass.  EXTREMITIES: No pedal edema, cyanosis, or clubbing.  NEUROLOGIC: Cranial nerves II through XII are intact. Muscle strength 5/5 in all extremities. Sensation intact. Gait not checked.  PSYCHIATRIC: The patient is alert and oriented x 3.  SKIN: No obvious rash, lesion, or ulcer.   DATA REVIEW:   CBC  Recent Labs Lab 05/23/15 0503  WBC 7.3  HGB 8.5*  HCT 27.5*  PLT 150    Chemistries   Recent Labs Lab 05/16/15 2323  05/22/15 0519  NA 137  < > 139  K 2.6*  < > 3.6  CL 101  < > 105  CO2 24  < > 27  GLUCOSE 234*  < > 97  BUN 18  < > 20  CREATININE 1.77*  < > 1.19*  CALCIUM 8.4*  < > 8.2*  AST 32  --   --   ALT 16  --   --   ALKPHOS 84  --   --   BILITOT 0.2*  --   --   < > = values in this interval not displayed.  Cardiac Enzymes  Recent Labs Lab 05/21/15 1625  TROPONINI 0.06*    Microbiology Results  Results for  orders placed or performed during the hospital encounter of 05/20/15  Urine culture     Status: None   Collection Time: 05/20/15  9:23 PM  Result Value Ref Range Status   Specimen Description URINE, RANDOM  Final   Special Requests NONE  Final   Culture MULTIPLE SPECIES PRESENT, SUGGEST RECOLLECTION  Final   Report Status 05/22/2015 FINAL  Final  MRSA PCR Screening     Status: None   Collection Time: 05/21/15  3:53 AM  Result Value Ref Range Status   MRSA by PCR NEGATIVE NEGATIVE Final    Comment:        The GeneXpert MRSA Assay (FDA approved for NASAL specimens only), is one component of a comprehensive MRSA colonization surveillance program. It is not intended to diagnose MRSA infection nor to guide or monitor treatment for MRSA infections.     RADIOLOGY:  No results found.  EKG:   Orders placed or performed during the hospital encounter of 05/20/15  . ED EKG  . ED EKG  . EKG 12-Lead  . EKG 12-Lead      Management plans discussed with the patient, family and they are in agreement.  CODE STATUS:     Code Status Orders        Start     Ordered   05/21/15 0334  Full code   Continuous     05/21/15 0333    Advance Directive Documentation        Most Recent Value   Type of Advance Directive  Healthcare Power of Attorney   Pre-existing out of facility DNR order (yellow form or pink MOST form)     "MOST" Form in Place?        TOTAL TIME TAKING CARE OF THIS PATIENT:  36 minutes.    Gladstone Lighter M.D on 05/23/2015 at 9:12 AM  Between 7am to 6pm - Pager - [phone]  After 6pm go to [email or web address] - password [email or web address] [facility]  Office  [phone]  CC: Primary care physician; Lavera Guise, MD

## 2015-05-23 NOTE — Progress Notes (Signed)
Patient for d/c today to ALF bed at [facility] as prior to admission. Son, Laurey Arrow, and patient agreeable to this plan- plan transfer via EMS. ALF rep also aware of plans for return with [facility] PT and SW to follow at [facility]. Eduard Clos, MSW, [place]

## 2015-05-23 NOTE — Clinical Social Work Placement (Signed)
   CLINICAL SOCIAL WORK PLACEMENT  NOTE  Date:  05/23/2015  Patient Details  Name: Mackenzie Rose MRN: [id number] Date of Birth: 02-08-1946  Clinical Social Work is seeking post-discharge placement for this patient at the [place] level of care (*CSW will initial, date and re-position this form in  chart as items are completed):  Yes   Patient/family provided with [place] Work Department's list of facilities offering this level of care within the geographic area requested by the patient (or if unable, by the patient's family).  Yes   Patient/family informed of their freedom to choose among providers that offer the needed level of care, that participate in Medicare, Medicaid or managed care program needed by the patient, have an available bed and are willing to accept the patient.  Yes   Patient/family informed of [facility]'s ownership interest in [facility] and [facility], as well as of the fact that they are under no obligation to receive care at these facilities.  PASRR submitted to EDS on 05/23/15     PASRR number received on 05/23/15     Existing PASRR number confirmed on       FL2 transmitted to all facilities in geographic area requested by pt/family on 05/23/15     FL2 transmitted to all facilities within larger geographic area on       Patient informed that his/her managed care company has contracts with or will negotiate with certain facilities, including the following:        Yes   Patient/family informed of bed offers received.  Patient chooses bed at  [facility])     Physician recommends and patient chooses bed at      Patient to be transferred to  ([facility]) on 05/23/15.  Patient to be transferred to facility by  (EMS)     Patient family notified on 05/23/15 of transfer.  Name of family member notified:   (son- Laurey Arrow A.)     PHYSICIAN       Additional Comment:     _______________________________________________ Ludwig Clarks, LCSW 05/23/2015, 4:31 PM

## 2015-05-23 NOTE — Progress Notes (Signed)
Requested by Dr. Tressia Miners to remove the 4 staples in the back of pts head- 4 staples removed/ tolerated well/ incision scabbed and well approximated

## 2015-05-23 NOTE — Anesthesia Postprocedure Evaluation (Signed)
  Anesthesia Post-op Note  Patient: Mackenzie Rose  Procedure(s) Performed: Procedure(s): ESOPHAGOGASTRODUODENOSCOPY (EGD) WITH PROPOFOL (N/A)  Anesthesia type:General  Patient location: PACU  Post pain: Pain level controlled  Post assessment: Post-op Vital signs reviewed, Patient's Cardiovascular Status Stable, Respiratory Function Stable, Patent Airway and No signs of Nausea or vomiting  Post vital signs: Reviewed and stable  Last Vitals:  Filed Vitals:   05/23/15 0744  BP: 170/93  Pulse: 90  Temp: 37.1 C  Resp: 18    Level of consciousness: awake, alert  and patient cooperative  Complications: No apparent anesthesia complications

## 2015-05-23 NOTE — Care Management Note (Addendum)
Case Management Note  Patient Details  Name: Mackenzie Rose MRN: [id number] Date of Birth: 04-Sep-1946  Subjective/Objective:   Patient will be discharged today to [facility].   Notified Sharrie Rothman with [facility]  of POC.            Action/Plan:   Expected Discharge Date:  05/23/15               Expected Discharge Plan:     In-House Referral:     Discharge planning Services     Post Acute Care Choice:    Choice offered to:     DME Arranged:    DME Agency:     HH Arranged:    [place] Agency:     Status of Service:     Medicare Important Message Given:  Yes-third notification given Date Medicare IM Given:    Medicare IM give by:    Date Additional Medicare IM Given:    Additional Medicare Important Message give by:     If discussed at [place] of Stay Meetings, dates discussed:    Additional Comments:  Jolly Mango, RN 05/23/2015, 12:54 PM

## 2015-05-23 NOTE — Care Management (Signed)
Patient discharging back to facility today. Mackenzie Rose with [facility] home health notified of need to add social worker and that patient was discharging today. No further RNCM needs. Case closed.

## 2015-05-23 NOTE — Progress Notes (Signed)
Report called to [facility]/ iv and tele removed/ EMS called to transport

## 2015-05-23 NOTE — Progress Notes (Signed)
Physical Therapy Treatment Patient Details Name: Mackenzie Rose MRN: [id number] DOB: 01/04/1946 Today's Date: 05/23/2015    History of Present Illness Pt just discharged from hospital and re-admitted within 1 day. Pt with history of multiple falls and complains of nausea/vomiting. Pt with history of Afib, HTN, COPD, melanoma, and dementia. Pt lives at [place]. Pt just finished receiving blood transfusion. Pt had upper endoscopy on 05/22/15 that revealed hiatal hernia    PT Comments    Pt making progress towards goals but is still showing significant endurance deficits as well as unsteadiness with her gait. Pt is slightly impulsive and needs to be monitored closely with transfers. Given the extent of her deficits she will continue to benefit from skilled PT in order to eventually return her to optimal PLOF. Should her ALF not be able to provide her assistance with mobility, she will benefit from short term rehab in order to restore her baseline functioning.  Follow Up Recommendations  Home health PT;Supervision for mobility/OOB (If ALF, cannot provide assist, pt will benefit from SNF)     Equipment Recommendations  None recommended by PT    Recommendations for Other Services       Precautions / Restrictions Precautions Precautions: Fall Restrictions Weight Bearing Restrictions: No    Mobility  Bed Mobility Overal bed mobility: Needs Assistance Bed Mobility: Supine to Sit     Supine to sit: Min assist Sit to supine: Min assist   General bed mobility comments: Pt slightly weaker today requiring assist for bed mobility but shows good functional strength after initial assist.   Transfers Overall transfer level: Needs assistance Equipment used: Rolling walker (2 wheeled) Transfers: Sit to/from Stand Sit to Stand: Min assist         General transfer comment: Pt requires assist for initial stand. Slightly wobbly in standing. Needs RW for stability    Ambulation/Gait Ambulation/Gait assistance: Min assist Ambulation Distance (Feet): 60 Feet (2 x 30 ft) Assistive device: Rolling walker (2 wheeled) Gait Pattern/deviations: Step-through pattern;Shuffle Gait velocity: decreased    General Gait Details: Pt still wobbly with ambulation and fatiguing after minimal distances. Needs substantial break between sets of ambulation. O2 sat never fell below 90%. Pt needs to be cued to continue to hold onto her walker towards the end of ambulation   Stairs            Wheelchair Mobility    Modified Rankin (Stroke Patients Only)       Balance Overall balance assessment: History of Falls                                  Cognition Arousal/Alertness: Awake/alert Behavior During Therapy: WFL for tasks assessed/performed Overall Cognitive Status: Within Functional Limits for tasks assessed                      Exercises Other Exercises Other Exercises: Pt performed bilateral therex x 12 reps at supervision for proper technique. Exercises performed: ankle pumps, quad sets, glute sets, SLR, LAQ    General Comments        Pertinent Vitals/Pain Pain Assessment: No/denies pain    Home Living                      Prior Function            PT Goals (current goals can now be found in the care  plan section) Acute Rehab PT Goals Patient Stated Goal: to walk a bit PT Goal Formulation: With patient Time For Goal Achievement: 06/04/15 Potential to Achieve Goals: Good Progress towards PT goals: Progressing toward goals    Frequency  Min 2X/week    PT Plan Current plan remains appropriate;Other (comment) (reference followup recommendations)    Co-evaluation             End of Session Equipment Utilized During Treatment: Gait belt Activity Tolerance: Patient limited by fatigue Patient left: in chair;with call bell/phone within reach;with chair alarm set;with SCD's reapplied     Time:  3159-4585 PT Time Calculation (min) (ACUTE ONLY): 24 min  Charges:                       G CodesJanyth Contes June 12, 2015, 12:52 PM  Janyth Contes, SPT. [phone]

## 2015-05-23 NOTE — Progress Notes (Signed)
CSW has discussed case with PT as well as with ALF staff, patient and son. CSW has secured a SNF rehab bed for patient at [facility]. She feels this level of care will provide her with better care than ALF.  Patient's son updated as well- will plan transfer via EMS for later today.  Eduard Clos, MSW, [place]

## 2015-05-29 ENCOUNTER — Inpatient Hospital Stay: Payer: No Typology Code available for payment source

## 2015-05-29 ENCOUNTER — Inpatient Hospital Stay: Payer: No Typology Code available for payment source | Attending: Oncology | Admitting: Oncology

## 2015-05-29 VITALS — BP 158/85 | HR 82 | Resp 18

## 2015-05-29 VITALS — BP 171/92 | HR 94 | Temp 97.1°F | Resp 16

## 2015-05-29 DIAGNOSIS — G7000 Myasthenia gravis without (acute) exacerbation: Secondary | ICD-10-CM | POA: Insufficient documentation

## 2015-05-29 DIAGNOSIS — F319 Bipolar disorder, unspecified: Secondary | ICD-10-CM | POA: Diagnosis not present

## 2015-05-29 DIAGNOSIS — I129 Hypertensive chronic kidney disease with stage 1 through stage 4 chronic kidney disease, or unspecified chronic kidney disease: Secondary | ICD-10-CM | POA: Insufficient documentation

## 2015-05-29 DIAGNOSIS — E780 Pure hypercholesterolemia: Secondary | ICD-10-CM | POA: Diagnosis not present

## 2015-05-29 DIAGNOSIS — Z79899 Other long term (current) drug therapy: Secondary | ICD-10-CM | POA: Diagnosis not present

## 2015-05-29 DIAGNOSIS — J449 Chronic obstructive pulmonary disease, unspecified: Secondary | ICD-10-CM | POA: Diagnosis not present

## 2015-05-29 DIAGNOSIS — D500 Iron deficiency anemia secondary to blood loss (chronic): Secondary | ICD-10-CM

## 2015-05-29 DIAGNOSIS — F17210 Nicotine dependence, cigarettes, uncomplicated: Secondary | ICD-10-CM | POA: Diagnosis not present

## 2015-05-29 DIAGNOSIS — I4891 Unspecified atrial fibrillation: Secondary | ICD-10-CM | POA: Insufficient documentation

## 2015-05-29 DIAGNOSIS — F0390 Unspecified dementia without behavioral disturbance: Secondary | ICD-10-CM | POA: Insufficient documentation

## 2015-05-29 DIAGNOSIS — D509 Iron deficiency anemia, unspecified: Secondary | ICD-10-CM

## 2015-05-29 DIAGNOSIS — E119 Type 2 diabetes mellitus without complications: Secondary | ICD-10-CM | POA: Insufficient documentation

## 2015-05-29 MED ORDER — SODIUM CHLORIDE 0.9 % IV SOLN
Freq: Once | INTRAVENOUS | Status: AC
  Administered 2015-05-29: 15:00:00 via INTRAVENOUS
  Filled 2015-05-29: qty 1000

## 2015-05-29 MED ORDER — SODIUM CHLORIDE 0.9 % IV SOLN
510.0000 mg | Freq: Once | INTRAVENOUS | Status: AC
  Administered 2015-05-29: 510 mg via INTRAVENOUS
  Filled 2015-05-29: qty 17

## 2015-06-05 ENCOUNTER — Inpatient Hospital Stay: Payer: No Typology Code available for payment source

## 2015-06-07 NOTE — Progress Notes (Signed)
[name]  [phone] [phone]  ID: Mackenzie Rose OB: 1946-08-10  MR#: [id number]  YWV#:[id number]  Patient Care Team: Mackenzie Guise, MD as PCP - General (Internal Medicine)  CHIEF COMPLAINT:  Chief Complaint  Patient presents with  . Anemia    INTERVAL HISTORY: Patient returns to clinic today for further evaluation, discussion of her laboratory work, and treatment planning. She continues to feel weak and fatigued, but otherwise feels well. She has no neurologic complaints. She denies any recent fevers. She denies any chest pain or shortness of breath. She denies any nausea, vomiting, constipation, or diarrhea. She has no melanoma or hematochezia. Patient offers no further specific complaints.  REVIEW OF SYSTEMS:   Review of Systems  Constitutional: Positive for malaise/fatigue. Negative for fever.  Respiratory: Negative.   Cardiovascular: Negative.   Gastrointestinal: Negative.  Negative for blood in stool and melena.  Neurological: Positive for weakness.    As per HPI. Otherwise, a complete review of systems is negatve.  PAST MEDICAL HISTORY: Past Medical History  Diagnosis Date  . Heart disease   . Atrial fibrillation     a. reported a-fib; b. unknown chronicity; c. dates back to 1970s; d. not on long term anticoagulation 2/2 no documented a-fib  . Hypertension   . High cholesterol   . Diabetes     borderline  . COPD (chronic obstructive pulmonary disease)   . Melanoma   . Kidney disorder   . Tremor   . Autoimmune disease   . Depressed   . Anxiety disorder   . Migraine   . Dementia   . Motion sickness   . Myasthenia gravis   . Bipolar disorder   . Dementia   . Acute renal failure   . Near syncope     PAST SURGICAL HISTORY: Past Surgical History  Procedure Laterality Date  . Tubal ligation    . Retena repair    . Cataract extraction    . Cholecystectomy    . Esophagogastroduodenoscopy (egd) with propofol N/A  05/22/2015    Procedure: ESOPHAGOGASTRODUODENOSCOPY (EGD) WITH PROPOFOL;  Surgeon: Lucilla Lame, MD;  Location: [facility] ENDOSCOPY;  Service: Endoscopy;  Laterality: N/A;    FAMILY HISTORY Family History  Problem Relation Age of Onset  . Heart disease Father   . Other Mother     ruptured hernia  . Breast cancer Sister   . Heart disease Sister   . Diabetes Mellitus II Sister     x2       ADVANCED DIRECTIVES:    HEALTH MAINTENANCE: Social History  Substance Use Topics  . Smoking status: Current Some Day Smoker -- 2.00 packs/day for 50 years    Types: Cigarettes    Start date: 03/28/1965  . Smokeless tobacco: Never Used  . Alcohol Use: No     Comment: Occasionally     Colonoscopy:  PAP:  Bone density:  Lipid panel:  Allergies  Allergen Reactions  . Lactose Intolerance (Gi) Other (See Comments)    Reaction:  GI upset   . Lithium Other (See Comments)    Reaction:  Unknown   . Penicillins Rash    Current Outpatient Prescriptions  Medication Sig Dispense Refill  . acetaminophen (TYLENOL) 650 MG CR tablet Take 650 mg by mouth daily. Pt takes daily at 2pm and every four hours as needed for pain.    Marland Kitchen albuterol (PROVENTIL HFA;VENTOLIN HFA) 108 (90 BASE) MCG/ACT inhaler Inhale 2 puffs into the lungs every 4 (four) hours  as needed for wheezing or shortness of breath. 6.7 g 1  . bismuth subsalicylate (PEPTO BISMOL) 262 MG/15ML suspension Take 5 mLs by mouth 4 (four) times daily as needed for diarrhea or loose stools (or nausea).    . busPIRone (BUSPAR) 10 MG tablet Take 10 mg by mouth 2 (two) times daily.     Marland Kitchen Dextromethorphan-Benzocaine (SORE THROAT & COUGH LOZENGES MT) Use as directed 1 lozenge in the mouth or throat 4 (four) times daily as needed (for cough).    . diclofenac sodium (VOLTAREN) 1 % GEL Apply 2 g topically 2 (two) times daily.     Marland Kitchen diltiazem (CARDIZEM CD) 180 MG 24 hr capsule Take 180 mg by mouth daily.     Marland Kitchen donepezil (ARICEPT) 10 MG tablet Take 1 tablet (10 mg  total) by mouth daily. 30 tablet 3  . ferrous sulfate 325 (65 FE) MG tablet Take 1 tablet (325 mg total) by mouth 2 (two) times daily with a meal. 60 tablet 3  . Fluticasone-Salmeterol (ADVAIR) 250-50 MCG/DOSE AEPB Inhale 1 puff into the lungs 2 (two) times daily.    Marland Kitchen guaifenesin (ROBITUSSIN) 100 MG/5ML syrup Take 100 mg by mouth 2 (two) times daily as needed for cough.    . hydrALAZINE (APRESOLINE) 50 MG tablet Take 1 tablet (50 mg total) by mouth 3 (three) times daily. 90 tablet 0  . lamoTRIgine (LAMICTAL) 100 MG tablet Take 100 mg by mouth 2 (two) times daily.    Marland Kitchen latanoprost (XALATAN) 0.005 % ophthalmic solution Place 1 drop into both eyes at bedtime.     . nicotine (NICODERM CQ - DOSED IN MG/24 HR) 7 mg/24hr patch Place 1 patch (7 mg total) onto the skin daily. 15 patch 0  . ondansetron (ZOFRAN) 4 MG tablet Take 1 tablet (4 mg total) by mouth every 8 (eight) hours as needed for nausea or vomiting. 20 tablet 0  . pantoprazole (PROTONIX) 40 MG tablet Take 1 tablet (40 mg total) by mouth 2 (two) times daily before a meal. 60 tablet 2  . phenylephrine-shark liver oil-mineral oil-petrolatum (PREPARATION H) 0.25-3-14-71.9 % rectal ointment Place 1 application rectally 2 (two) times daily as needed for hemorrhoids.    . polyethylene glycol (MIRALAX / GLYCOLAX) packet Take 17 g by mouth daily as needed for mild constipation.    . potassium chloride SA (K-DUR,KLOR-CON) 20 MEQ tablet Take 20 mEq by mouth daily.    . predniSONE (DELTASONE) 10 MG tablet Take 30 mg by mouth every other day.    . pyridostigmine (MESTINON) 60 MG tablet Take 60 mg by mouth 3 (three) times daily.     . QUEtiapine (SEROQUEL) 200 MG tablet Take 1 tablet (200 mg total) by mouth at bedtime. 30 tablet 3  . sodium chloride (OCEAN) 0.65 % SOLN nasal spray Place 2 sprays into both nostrils daily as needed for congestion.     . traMADol (ULTRAM) 50 MG tablet Take 50 mg by mouth 2 (two) times daily as needed.    . traZODone (DESYREL)  50 MG tablet Take 1 tablet (50 mg total) by mouth at bedtime. 30 tablet 0   No current facility-administered medications for this visit.    OBJECTIVE: Filed Vitals:   05/29/15 1529  BP: 171/92  Pulse: 94  Temp: 97.1 F (36.2 C)  Resp: 16     There is no weight on file to calculate BMI.    ECOG FS:1 - Symptomatic but completely ambulatory  General: Well-developed, well-nourished, no acute  distress. Eyes: Pink conjunctiva, anicteric sclera. Lungs: Clear to auscultation bilaterally. Heart: Regular rate and rhythm. No rubs, murmurs, or gallops. Abdomen: Soft, nontender, nondistended. No organomegaly noted, normoactive bowel sounds. Musculoskeletal: No edema, cyanosis, or clubbing. Neuro: Alert, answering all questions appropriately. Cranial nerves grossly intact. Skin: No rashes or petechiae noted. Psych: Normal affect.  LAB RESULTS:  Lab Results  Component Value Date   NA 139 05/22/2015   K 3.6 05/22/2015   CL 105 05/22/2015   CO2 27 05/22/2015   GLUCOSE 97 05/22/2015   BUN 20 05/22/2015   CREATININE 1.19* 05/22/2015   CALCIUM 8.2* 05/22/2015   PROT 5.2* 05/16/2015   ALBUMIN 2.9* 05/16/2015   AST 32 05/16/2015   ALT 16 05/16/2015   ALKPHOS 84 05/16/2015   BILITOT 0.2* 05/16/2015   GFRNONAA 45* 05/22/2015   GFRAA 52* 05/22/2015    Lab Results  Component Value Date   WBC 7.3 05/23/2015   NEUTROABS 10.7* 04/25/2015   HGB 8.5* 05/23/2015   HCT 27.5* 05/23/2015   MCV 74.2* 05/23/2015   PLT 150 05/23/2015     STUDIES: Ct Abdomen Pelvis Wo Contrast  05/21/2015   CLINICAL DATA:  Diffuse primarily lower abdominal pain with GI bleed and melena. Increased weakness.  EXAM: CT ABDOMEN AND PELVIS WITHOUT CONTRAST  TECHNIQUE: Multidetector CT imaging of the abdomen and pelvis was performed following the standard protocol without IV contrast.  COMPARISON:  Abdominal ultrasound 12/08/2014  FINDINGS: Lower chest: Small bilateral pleural effusions. Ill-defined partially  ground-glass opacity in the left lower lobe, only partially included in indeterminate. Mild atelectasis in both lower lobes.  Liver: Normal in size. Left lobe hepatic cyst measures 1 cm in the region of the porta hepatis.  Hepatobiliary: Gallbladder is physiologically distended. The calcifications in the gallbladder wall on prior ultrasound are not well seen on CT. No calcified gallstones. No biliary dilatation.  Pancreas: No ductal dilatation or surrounding inflammation.  Spleen: Normal.  Adrenal glands: No nodule. Mild left adrenal thickening without discrete nodule.  Kidneys: Atrophic left kidney with compensatory hypertrophy of the right kidney. Lower pole cyst measures 2.8 cm in the right kidney, the complexity and ultrasound is not well seen by CT. Cyst in the upper pole measures 3.1 cm. This cyst in the left kidney on prior ultrasound is not well seen. No hydronephrosis.  Stomach/Bowel: Moderately large hiatal hernia. Stomach physiologically distended. There are no dilated or thickened small bowel loops. No bowel dilatation. Diverticulosis throughout the distal colon without diverticulitis. Small volume of stool throughout the colon without colonic wall thickening. The appendix is normal.  Vascular/Lymphatic: No retroperitoneal adenopathy. Fusiform dilatation of the distal descending and suprarenal abdominal aorta, maximal dimension 3.6 cm. Distal and mid aorta are tortuous, no aneurysmal dilatation of the infrarenal abdominal aorta. There is diffuse atheromatous calcifications. IVC appears narrowed.  Reproductive: Uterus is atrophic, normal for age. Ovaries are quiescent, normal for age. No adnexal mass.  Bladder: Physiologically distended without wall thickening.  Other: No free air, free fluid, or intra-abdominal fluid collection. Fat within both inguinal canals.  Musculoskeletal: There are no acute or suspicious osseous abnormalities. Scoliotic curvature in the spine with associated degenerative change.  Compression deformities of L1 and L2 appear chronic.  IMPRESSION: 1. Diverticulosis throughout the colon without diverticulitis. 2. Moderately large hiatal hernia. 3. Left renal atrophy with mild compensatory hypertrophy of the right kidney. There are right renal cysts, the complexity on prior ultrasound is not well appreciated. Cystic lesion in the left kidney are not  well appreciated. Recommend further characterization on a nonemergent basis with MRI, with contrast if renal function permits. 4. 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 Recommend followup by ultrasound in 2 years. This recommendation follows ACR consensus guidelines: White Paper of the ACR Incidental Findings Committee II on Vascular Findings. J Am Coll Radiol 2013; 10:789-794. 5. Ill-defined opacity in the left lower lobe, only partially included in the field of view. This may be infectious or inflammatory, however recommend nonemergent chest CT for further characterization as this was not seen radiographically.   Electronically Signed   By: Jeb Levering M.D.   On: 05/21/2015 02:10   Dg Chest 2 View  05/20/2015   CLINICAL DATA:  Shortness of breath.  EXAM: CHEST  2 VIEW  COMPARISON:  April 25, 2015.  FINDINGS: Stable cardiomediastinal silhouette. Stable hiatal hernia. No pneumothorax is noted. Minimal bilateral pleural effusions are noted. No acute pulmonary disease is noted. Stable calcified granuloma is noted in the left upper lobe. The visualized skeletal structures are unremarkable.  IMPRESSION: Minimal bilateral pleural effusions.  Stable hiatal hernia.   Electronically Signed   By: Marijo Conception, M.D.   On: 05/20/2015 20:17   Ct Head Wo Contrast  05/17/2015   CLINICAL DATA:  Patient fell striking back of head on a hand rail. No loss of consciousness.  EXAM: CT HEAD WITHOUT CONTRAST  TECHNIQUE: Contiguous axial images were obtained from the base of the skull  through the vertex without intravenous contrast.  COMPARISON:  01/31/2014  FINDINGS: Mild diffuse cerebral atrophy. No ventricular dilatation. Patchy low-attenuation changes in the deep white matter consistent with small vessel ischemia. Focal areas of encephalomalacia in the right frontal and left posterior parietal regions are unchanged since prior study and probably represent old infarcts. No mass effect or midline shift. No abnormal extra-axial fluid collections. Gray-white matter junctions are distinct. Basal cisterns are not effaced. No evidence of acute intracranial hemorrhage. No depressed skull fractures. Mucosal thickening in the maxillary antra. Mastoid air cells are not opacified. Subcutaneous emphysema in the scalp over the left posterior parietal/ occipital region.  IMPRESSION: No acute intracranial abnormalities. Chronic atrophy and small vessel ischemic changes. 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 Subcutaneous scalp laceration over the left occipital/ parietal region.   Electronically Signed   By: Lucienne Capers M.D.   On: 05/17/2015 00:13    ASSESSMENT: Iron deficiency anemia.  PLAN:    1. Iron deficiency anemia: Patient was found to have significantly reduced hemoglobin as well as iron stores. Both her erythropoietin level and reticulocyte count are appropriately elevated. Her hemoglobin has decreased further since her last visit as well.  The remainder of her laboratory work was either negative or within normal limits. Patient was also noted to have chronic renal insufficiency and may benefit from Procrit in the future. Proceed with 510 mg IV Feraheme today. Patient will return to clinic in 1 week for a second infusion and then in 2 months with repeat laboratory work and further evaluation.  2. Chronic renal insufficiency: Treatment per nephrology.  Patient expressed understanding and was in agreement with this plan. She also understands that She can  call clinic at any time with any questions, concerns, or complaints.    Lloyd Huger, MD   06/07/2015 1:10 PM

## 2015-06-08 ENCOUNTER — Emergency Department: Payer: Medicare Other

## 2015-06-08 ENCOUNTER — Inpatient Hospital Stay
Admission: EM | Admit: 2015-06-08 | Discharge: 2015-06-27 | DRG: 291 | Disposition: A | Discharge status: Skilled Nursing Facility | Payer: Medicare Other | Attending: Internal Medicine | Admitting: Internal Medicine

## 2015-06-08 DIAGNOSIS — E119 Type 2 diabetes mellitus without complications: Secondary | ICD-10-CM

## 2015-06-08 DIAGNOSIS — R0602 Shortness of breath: Secondary | ICD-10-CM

## 2015-06-08 DIAGNOSIS — I10 Essential (primary) hypertension: Secondary | ICD-10-CM | POA: Diagnosis present

## 2015-06-08 DIAGNOSIS — J189 Pneumonia, unspecified organism: Secondary | ICD-10-CM

## 2015-06-08 DIAGNOSIS — N183 Chronic kidney disease, stage 3 unspecified: Secondary | ICD-10-CM

## 2015-06-08 DIAGNOSIS — J9601 Acute respiratory failure with hypoxia: Secondary | ICD-10-CM

## 2015-06-08 DIAGNOSIS — B372 Candidiasis of skin and nail: Secondary | ICD-10-CM

## 2015-06-08 DIAGNOSIS — I82621 Acute embolism and thrombosis of deep veins of right upper extremity: Secondary | ICD-10-CM

## 2015-06-08 DIAGNOSIS — E875 Hyperkalemia: Secondary | ICD-10-CM

## 2015-06-08 DIAGNOSIS — F0390 Unspecified dementia without behavioral disturbance: Secondary | ICD-10-CM | POA: Diagnosis present

## 2015-06-08 DIAGNOSIS — Z95828 Presence of other vascular implants and grafts: Secondary | ICD-10-CM

## 2015-06-08 DIAGNOSIS — R0600 Dyspnea, unspecified: Secondary | ICD-10-CM

## 2015-06-08 DIAGNOSIS — I5043 Acute on chronic combined systolic (congestive) and diastolic (congestive) heart failure: Secondary | ICD-10-CM

## 2015-06-08 DIAGNOSIS — I1310 Hypertensive heart and chronic kidney disease without heart failure, with stage 1 through stage 4 chronic kidney disease, or unspecified chronic kidney disease: Secondary | ICD-10-CM | POA: Diagnosis not present

## 2015-06-08 DIAGNOSIS — N179 Acute kidney failure, unspecified: Secondary | ICD-10-CM | POA: Diagnosis present

## 2015-06-08 DIAGNOSIS — J690 Pneumonitis due to inhalation of food and vomit: Secondary | ICD-10-CM

## 2015-06-08 DIAGNOSIS — I5021 Acute systolic (congestive) heart failure: Secondary | ICD-10-CM | POA: Diagnosis present

## 2015-06-08 DIAGNOSIS — R229 Localized swelling, mass and lump, unspecified: Secondary | ICD-10-CM

## 2015-06-08 DIAGNOSIS — R1310 Dysphagia, unspecified: Secondary | ICD-10-CM

## 2015-06-08 DIAGNOSIS — I509 Heart failure, unspecified: Secondary | ICD-10-CM

## 2015-06-08 DIAGNOSIS — R609 Edema, unspecified: Secondary | ICD-10-CM

## 2015-06-08 DIAGNOSIS — N289 Disorder of kidney and ureter, unspecified: Secondary | ICD-10-CM

## 2015-06-08 DIAGNOSIS — IMO0002 Reserved for concepts with insufficient information to code with codable children: Secondary | ICD-10-CM

## 2015-06-08 DIAGNOSIS — L8990 Pressure ulcer of unspecified site, unspecified stage: Secondary | ICD-10-CM

## 2015-06-08 DIAGNOSIS — Z01818 Encounter for other preprocedural examination: Secondary | ICD-10-CM

## 2015-06-08 DIAGNOSIS — Z931 Gastrostomy status: Secondary | ICD-10-CM

## 2015-06-08 DIAGNOSIS — J9690 Respiratory failure, unspecified, unspecified whether with hypoxia or hypercapnia: Secondary | ICD-10-CM

## 2015-06-08 DIAGNOSIS — D72829 Elevated white blood cell count, unspecified: Secondary | ICD-10-CM

## 2015-06-08 DIAGNOSIS — J449 Chronic obstructive pulmonary disease, unspecified: Secondary | ICD-10-CM | POA: Diagnosis present

## 2015-06-08 DIAGNOSIS — G7000 Myasthenia gravis without (acute) exacerbation: Secondary | ICD-10-CM | POA: Diagnosis present

## 2015-06-08 DIAGNOSIS — I4891 Unspecified atrial fibrillation: Secondary | ICD-10-CM

## 2015-06-08 DIAGNOSIS — Z4659 Encounter for fitting and adjustment of other gastrointestinal appliance and device: Secondary | ICD-10-CM

## 2015-06-08 DIAGNOSIS — F3160 Bipolar disorder, current episode mixed, unspecified: Secondary | ICD-10-CM | POA: Diagnosis present

## 2015-06-08 LAB — COMPREHENSIVE METABOLIC PANEL
ALBUMIN: 3.2 g/dL — AB (ref 3.5–5.0)
ALT: 18 U/L (ref 14–54)
AST: 26 U/L (ref 15–41)
Alkaline Phosphatase: 114 U/L (ref 38–126)
Anion gap: 8 (ref 5–15)
BUN: 25 mg/dL — AB (ref 6–20)
CHLORIDE: 102 mmol/L (ref 101–111)
CO2: 25 mmol/L (ref 22–32)
CREATININE: 2.15 mg/dL — AB (ref 0.44–1.00)
Calcium: 8.8 mg/dL — ABNORMAL LOW (ref 8.9–10.3)
GFR calc Af Amer: 25 mL/min — ABNORMAL LOW (ref >60)
GFR, EST NON AFRICAN AMERICAN: 22 mL/min — AB (ref >60)
GLUCOSE: 236 mg/dL — AB (ref 65–99)
POTASSIUM: 6.2 mmol/L — AB (ref 3.5–5.1)
SODIUM: 135 mmol/L (ref 135–145)
Total Bilirubin: 0.7 mg/dL (ref 0.3–1.2)
Total Protein: 6.1 g/dL — ABNORMAL LOW (ref 6.5–8.1)

## 2015-06-08 LAB — CBC
HCT: 33.4 % — ABNORMAL LOW (ref 35.0–47.0)
Hemoglobin: 10.1 g/dL — ABNORMAL LOW (ref 12.0–16.0)
MCH: 25 pg — ABNORMAL LOW (ref 26.0–34.0)
MCHC: 30.1 g/dL — ABNORMAL LOW (ref 32.0–36.0)
MCV: 83 fL (ref 80.0–100.0)
PLATELETS: 259 10*3/uL (ref 150–440)
RBC: 4.02 MIL/uL (ref 3.80–5.20)
RDW: 28.7 % — AB (ref 11.5–14.5)
WBC: 9.4 10*3/uL (ref 3.6–11.0)

## 2015-06-08 LAB — TROPONIN I: TROPONIN I: 0.12 ng/mL — AB (ref <0.031)

## 2015-06-08 LAB — BRAIN NATRIURETIC PEPTIDE: B Natriuretic Peptide: 2525 pg/mL — ABNORMAL HIGH (ref 0.0–100.0)

## 2015-06-08 NOTE — ED Notes (Signed)
Pt arrived from [place] with difficulty breathing via EMS. AH gave 9 duonebs and ems gave 2 albuterol neb tx. Neb tx in process on arrival. A/O on arrival.

## 2015-06-09 ENCOUNTER — Encounter: Payer: Self-pay | Admitting: *Deleted

## 2015-06-09 ENCOUNTER — Inpatient Hospital Stay (HOSPITAL_COMMUNITY)
Admit: 2015-06-09 | Discharge: 2015-06-09 | Disposition: A | Discharge status: Home / Self Care | Payer: Medicare Other | Attending: Internal Medicine | Admitting: Internal Medicine

## 2015-06-09 DIAGNOSIS — R1310 Dysphagia, unspecified: Secondary | ICD-10-CM | POA: Diagnosis not present

## 2015-06-09 DIAGNOSIS — L89152 Pressure ulcer of sacral region, stage 2: Secondary | ICD-10-CM | POA: Diagnosis not present

## 2015-06-09 DIAGNOSIS — E1122 Type 2 diabetes mellitus with diabetic chronic kidney disease: Secondary | ICD-10-CM | POA: Diagnosis present

## 2015-06-09 DIAGNOSIS — B372 Candidiasis of skin and nail: Secondary | ICD-10-CM | POA: Diagnosis present

## 2015-06-09 DIAGNOSIS — I130 Hypertensive heart and chronic kidney disease with heart failure and stage 1 through stage 4 chronic kidney disease, or unspecified chronic kidney disease: Principal | ICD-10-CM | POA: Diagnosis present

## 2015-06-09 DIAGNOSIS — Z515 Encounter for palliative care: Secondary | ICD-10-CM

## 2015-06-09 DIAGNOSIS — J9620 Acute and chronic respiratory failure, unspecified whether with hypoxia or hypercapnia: Secondary | ICD-10-CM | POA: Diagnosis not present

## 2015-06-09 DIAGNOSIS — E43 Unspecified severe protein-calorie malnutrition: Secondary | ICD-10-CM | POA: Diagnosis present

## 2015-06-09 DIAGNOSIS — F319 Bipolar disorder, unspecified: Secondary | ICD-10-CM | POA: Diagnosis present

## 2015-06-09 DIAGNOSIS — N281 Cyst of kidney, acquired: Secondary | ICD-10-CM | POA: Diagnosis present

## 2015-06-09 DIAGNOSIS — I5043 Acute on chronic combined systolic (congestive) and diastolic (congestive) heart failure: Secondary | ICD-10-CM | POA: Diagnosis present

## 2015-06-09 DIAGNOSIS — I340 Nonrheumatic mitral (valve) insufficiency: Secondary | ICD-10-CM | POA: Diagnosis not present

## 2015-06-09 DIAGNOSIS — Z8249 Family history of ischemic heart disease and other diseases of the circulatory system: Secondary | ICD-10-CM

## 2015-06-09 DIAGNOSIS — K279 Peptic ulcer, site unspecified, unspecified as acute or chronic, without hemorrhage or perforation: Secondary | ICD-10-CM | POA: Diagnosis present

## 2015-06-09 DIAGNOSIS — E7800 Pure hypercholesterolemia, unspecified: Secondary | ICD-10-CM | POA: Diagnosis present

## 2015-06-09 DIAGNOSIS — J9601 Acute respiratory failure with hypoxia: Secondary | ICD-10-CM | POA: Diagnosis present

## 2015-06-09 DIAGNOSIS — E873 Alkalosis: Secondary | ICD-10-CM | POA: Diagnosis present

## 2015-06-09 DIAGNOSIS — R55 Syncope and collapse: Secondary | ICD-10-CM | POA: Diagnosis present

## 2015-06-09 DIAGNOSIS — E876 Hypokalemia: Secondary | ICD-10-CM | POA: Diagnosis present

## 2015-06-09 DIAGNOSIS — I82621 Acute embolism and thrombosis of deep veins of right upper extremity: Secondary | ICD-10-CM | POA: Diagnosis present

## 2015-06-09 DIAGNOSIS — D509 Iron deficiency anemia, unspecified: Secondary | ICD-10-CM | POA: Diagnosis present

## 2015-06-09 DIAGNOSIS — Z803 Family history of malignant neoplasm of breast: Secondary | ICD-10-CM

## 2015-06-09 DIAGNOSIS — J449 Chronic obstructive pulmonary disease, unspecified: Secondary | ICD-10-CM | POA: Diagnosis present

## 2015-06-09 DIAGNOSIS — N179 Acute kidney failure, unspecified: Secondary | ICD-10-CM | POA: Diagnosis present

## 2015-06-09 DIAGNOSIS — Y848 Other medical procedures as the cause of abnormal reaction of the patient, or of later complication, without mention of misadventure at the time of the procedure: Secondary | ICD-10-CM | POA: Diagnosis present

## 2015-06-09 DIAGNOSIS — E875 Hyperkalemia: Secondary | ICD-10-CM | POA: Diagnosis present

## 2015-06-09 DIAGNOSIS — F419 Anxiety disorder, unspecified: Secondary | ICD-10-CM | POA: Diagnosis present

## 2015-06-09 DIAGNOSIS — I25119 Atherosclerotic heart disease of native coronary artery with unspecified angina pectoris: Secondary | ICD-10-CM | POA: Diagnosis present

## 2015-06-09 DIAGNOSIS — J9811 Atelectasis: Secondary | ICD-10-CM | POA: Diagnosis present

## 2015-06-09 DIAGNOSIS — Z833 Family history of diabetes mellitus: Secondary | ICD-10-CM

## 2015-06-09 DIAGNOSIS — I701 Atherosclerosis of renal artery: Secondary | ICD-10-CM | POA: Diagnosis present

## 2015-06-09 DIAGNOSIS — T783XXA Angioneurotic edema, initial encounter: Secondary | ICD-10-CM | POA: Diagnosis not present

## 2015-06-09 DIAGNOSIS — Z85820 Personal history of malignant melanoma of skin: Secondary | ICD-10-CM | POA: Diagnosis not present

## 2015-06-09 DIAGNOSIS — G9341 Metabolic encephalopathy: Secondary | ICD-10-CM | POA: Diagnosis present

## 2015-06-09 DIAGNOSIS — N183 Chronic kidney disease, stage 3 (moderate): Secondary | ICD-10-CM | POA: Diagnosis present

## 2015-06-09 DIAGNOSIS — Z880 Allergy status to penicillin: Secondary | ICD-10-CM

## 2015-06-09 DIAGNOSIS — K5900 Constipation, unspecified: Secondary | ICD-10-CM | POA: Diagnosis present

## 2015-06-09 DIAGNOSIS — G43909 Migraine, unspecified, not intractable, without status migrainosus: Secondary | ICD-10-CM | POA: Diagnosis present

## 2015-06-09 DIAGNOSIS — F17210 Nicotine dependence, cigarettes, uncomplicated: Secondary | ICD-10-CM | POA: Diagnosis present

## 2015-06-09 DIAGNOSIS — I5021 Acute systolic (congestive) heart failure: Secondary | ICD-10-CM | POA: Diagnosis present

## 2015-06-09 DIAGNOSIS — K2970 Gastritis, unspecified, without bleeding: Secondary | ICD-10-CM | POA: Diagnosis present

## 2015-06-09 DIAGNOSIS — K5790 Diverticulosis of intestine, part unspecified, without perforation or abscess without bleeding: Secondary | ICD-10-CM | POA: Diagnosis present

## 2015-06-09 DIAGNOSIS — J9612 Chronic respiratory failure with hypercapnia: Secondary | ICD-10-CM | POA: Diagnosis present

## 2015-06-09 DIAGNOSIS — T82868A Thrombosis of vascular prosthetic devices, implants and grafts, initial encounter: Secondary | ICD-10-CM | POA: Diagnosis present

## 2015-06-09 DIAGNOSIS — I429 Cardiomyopathy, unspecified: Secondary | ICD-10-CM | POA: Diagnosis present

## 2015-06-09 DIAGNOSIS — J439 Emphysema, unspecified: Secondary | ICD-10-CM | POA: Diagnosis not present

## 2015-06-09 DIAGNOSIS — G309 Alzheimer's disease, unspecified: Secondary | ICD-10-CM | POA: Diagnosis present

## 2015-06-09 DIAGNOSIS — E1121 Type 2 diabetes mellitus with diabetic nephropathy: Secondary | ICD-10-CM | POA: Diagnosis present

## 2015-06-09 DIAGNOSIS — Z9911 Dependence on respirator [ventilator] status: Secondary | ICD-10-CM

## 2015-06-09 DIAGNOSIS — R32 Unspecified urinary incontinence: Secondary | ICD-10-CM | POA: Diagnosis present

## 2015-06-09 DIAGNOSIS — G7000 Myasthenia gravis without (acute) exacerbation: Secondary | ICD-10-CM | POA: Diagnosis not present

## 2015-06-09 DIAGNOSIS — E785 Hyperlipidemia, unspecified: Secondary | ICD-10-CM | POA: Diagnosis present

## 2015-06-09 DIAGNOSIS — N390 Urinary tract infection, site not specified: Secondary | ICD-10-CM | POA: Diagnosis present

## 2015-06-09 DIAGNOSIS — R0602 Shortness of breath: Secondary | ICD-10-CM | POA: Diagnosis not present

## 2015-06-09 DIAGNOSIS — Z794 Long term (current) use of insulin: Secondary | ICD-10-CM | POA: Diagnosis not present

## 2015-06-09 DIAGNOSIS — E870 Hyperosmolality and hypernatremia: Secondary | ICD-10-CM | POA: Diagnosis present

## 2015-06-09 DIAGNOSIS — J690 Pneumonitis due to inhalation of food and vomit: Secondary | ICD-10-CM | POA: Diagnosis present

## 2015-06-09 DIAGNOSIS — K449 Diaphragmatic hernia without obstruction or gangrene: Secondary | ICD-10-CM | POA: Diagnosis present

## 2015-06-09 DIAGNOSIS — Z66 Do not resuscitate: Secondary | ICD-10-CM | POA: Diagnosis present

## 2015-06-09 DIAGNOSIS — J384 Edema of larynx: Secondary | ICD-10-CM | POA: Diagnosis present

## 2015-06-09 DIAGNOSIS — I482 Chronic atrial fibrillation: Secondary | ICD-10-CM | POA: Diagnosis present

## 2015-06-09 DIAGNOSIS — I1310 Hypertensive heart and chronic kidney disease without heart failure, with stage 1 through stage 4 chronic kidney disease, or unspecified chronic kidney disease: Secondary | ICD-10-CM | POA: Diagnosis present

## 2015-06-09 LAB — BLOOD GAS, ARTERIAL
Acid-base deficit: 0.3 mmol/L (ref 0.0–2.0)
Bicarbonate: 22.8 mEq/L (ref 21.0–28.0)
Delivery systems: POSITIVE
Expiratory PAP: 5
FIO2: 0.4
INSPIRATORY PAP: 10
MECHANICAL RATE: 10
O2 SAT: 99.3 %
PO2 ART: 141 mmHg — AB (ref 83.0–108.0)
Patient temperature: 37
pCO2 arterial: 32 mmHg (ref 32.0–48.0)
pH, Arterial: 7.46 — ABNORMAL HIGH (ref 7.350–7.450)

## 2015-06-09 LAB — MAGNESIUM: Magnesium: 2 mg/dL (ref 1.7–2.4)

## 2015-06-09 LAB — BASIC METABOLIC PANEL
ANION GAP: 8 (ref 5–15)
BUN: 25 mg/dL — AB (ref 6–20)
CO2: 31 mmol/L (ref 22–32)
Calcium: 9.2 mg/dL (ref 8.9–10.3)
Chloride: 104 mmol/L (ref 101–111)
Creatinine, Ser: 1.96 mg/dL — ABNORMAL HIGH (ref 0.44–1.00)
GFR calc Af Amer: 28 mL/min — ABNORMAL LOW (ref >60)
GFR calc non Af Amer: 25 mL/min — ABNORMAL LOW (ref >60)
GLUCOSE: 113 mg/dL — AB (ref 65–99)
POTASSIUM: 4.3 mmol/L (ref 3.5–5.1)
Sodium: 143 mmol/L (ref 135–145)

## 2015-06-09 LAB — CBC
HEMATOCRIT: 31 % — AB (ref 35.0–47.0)
HEMOGLOBIN: 9.8 g/dL — AB (ref 12.0–16.0)
MCH: 25.7 pg — AB (ref 26.0–34.0)
MCHC: 31.7 g/dL — AB (ref 32.0–36.0)
MCV: 81.1 fL (ref 80.0–100.0)
Platelets: 252 10*3/uL (ref 150–440)
RBC: 3.82 MIL/uL (ref 3.80–5.20)
RDW: 28.5 % — AB (ref 11.5–14.5)
WBC: 6.4 10*3/uL (ref 3.6–11.0)

## 2015-06-09 LAB — LACTIC ACID, PLASMA
LACTIC ACID, VENOUS: 1.3 mmol/L (ref 0.5–2.0)
LACTIC ACID, VENOUS: 1.1 mmol/L (ref 0.5–2.0)

## 2015-06-09 LAB — GLUCOSE, CAPILLARY
GLUCOSE-CAPILLARY: 228 mg/dL — AB (ref 65–99)
Glucose-Capillary: 105 mg/dL — ABNORMAL HIGH (ref 65–99)
Glucose-Capillary: 183 mg/dL — ABNORMAL HIGH (ref 65–99)
Glucose-Capillary: 138 mg/dL — ABNORMAL HIGH (ref 65–99)
Glucose-Capillary: 155 mg/dL — ABNORMAL HIGH (ref 65–99)

## 2015-06-09 LAB — PHOSPHORUS: PHOSPHORUS: 4.5 mg/dL (ref 2.5–4.6)

## 2015-06-09 LAB — MRSA PCR SCREENING: MRSA by PCR: NEGATIVE

## 2015-06-09 MED ORDER — PYRIDOSTIGMINE BROMIDE 60 MG PO TABS
60.0000 mg | ORAL_TABLET | Freq: Three times a day (TID) | ORAL | Status: DC
  Administered 2015-06-09 – 2015-06-13 (×14): 60 mg via ORAL
  Filled 2015-06-09 (×14): qty 1

## 2015-06-09 MED ORDER — NICOTINE 7 MG/24HR TD PT24
7.0000 mg | MEDICATED_PATCH | Freq: Every day | TRANSDERMAL | Status: DC
  Administered 2015-06-09 – 2015-06-13 (×5): 7 mg via TRANSDERMAL
  Filled 2015-06-09 (×5): qty 1

## 2015-06-09 MED ORDER — HEPARIN SODIUM (PORCINE) 5000 UNIT/ML IJ SOLN
5000.0000 [IU] | Freq: Three times a day (TID) | INTRAMUSCULAR | Status: DC
  Administered 2015-06-09 – 2015-06-11 (×9): 5000 [IU] via SUBCUTANEOUS
  Filled 2015-06-09 (×9): qty 1

## 2015-06-09 MED ORDER — SODIUM BICARBONATE 8.4 % IV SOLN
50.0000 meq | Freq: Once | INTRAVENOUS | Status: AC
  Administered 2015-06-09: 50 meq via INTRAVENOUS
  Filled 2015-06-09: qty 50

## 2015-06-09 MED ORDER — GUAIFENESIN 100 MG/5ML PO SOLN
10.0000 mL | Freq: Four times a day (QID) | ORAL | Status: DC | PRN
  Administered 2015-06-09 – 2015-06-10 (×4): 200 mg via ORAL
  Filled 2015-06-09 (×4): qty 10

## 2015-06-09 MED ORDER — SODIUM CHLORIDE 0.9 % IJ SOLN
3.0000 mL | Freq: Two times a day (BID) | INTRAMUSCULAR | Status: DC
  Administered 2015-06-09 – 2015-06-17 (×16): 3 mL via INTRAVENOUS

## 2015-06-09 MED ORDER — DONEPEZIL HCL 5 MG PO TABS
10.0000 mg | ORAL_TABLET | Freq: Every day | ORAL | Status: DC
  Administered 2015-06-09 – 2015-06-13 (×5): 10 mg via ORAL
  Filled 2015-06-09 (×6): qty 2

## 2015-06-09 MED ORDER — CETYLPYRIDINIUM CHLORIDE 0.05 % MT LIQD
7.0000 mL | Freq: Two times a day (BID) | OROMUCOSAL | Status: DC
  Administered 2015-06-09 – 2015-06-11 (×6): 7 mL via OROMUCOSAL

## 2015-06-09 MED ORDER — ACETAMINOPHEN 325 MG PO TABS: 650.0000 mg | ORAL_TABLET | Freq: Four times a day (QID) | ORAL | Status: DC | PRN

## 2015-06-09 MED ORDER — LATANOPROST 0.005 % OP SOLN
1.0000 [drp] | Freq: Every day | OPHTHALMIC | Status: DC
  Administered 2015-06-09 – 2015-06-26 (×16): 1 [drp] via OPHTHALMIC
  Filled 2015-06-09 (×5): qty 2.5

## 2015-06-09 MED ORDER — INSULIN ASPART 100 UNIT/ML ~~LOC~~ SOLN
0.0000 [IU] | Freq: Four times a day (QID) | SUBCUTANEOUS | Status: DC
  Administered 2015-06-09: 2 [IU] via SUBCUTANEOUS
  Filled 2015-06-09: qty 2

## 2015-06-09 MED ORDER — ALBUTEROL SULFATE (2.5 MG/3ML) 0.083% IN NEBU
2.5000 mg | INHALATION_SOLUTION | Freq: Once | RESPIRATORY_TRACT | Status: AC
  Administered 2015-06-09: 2.5 mg via RESPIRATORY_TRACT
  Filled 2015-06-09: qty 3

## 2015-06-09 MED ORDER — ACETAMINOPHEN 650 MG RE SUPP: 650.0000 mg | Freq: Four times a day (QID) | RECTAL | Status: DC | PRN

## 2015-06-09 MED ORDER — INSULIN ASPART 100 UNIT/ML ~~LOC~~ SOLN
10.0000 [IU] | Freq: Once | SUBCUTANEOUS | Status: AC
  Administered 2015-06-09: 10 [IU] via INTRAVENOUS
  Filled 2015-06-09: qty 10

## 2015-06-09 MED ORDER — FUROSEMIDE 10 MG/ML IJ SOLN
20.0000 mg | Freq: Two times a day (BID) | INTRAMUSCULAR | Status: DC
  Administered 2015-06-09 (×2): 20 mg via INTRAVENOUS
  Filled 2015-06-09 (×2): qty 2

## 2015-06-09 MED ORDER — PANTOPRAZOLE SODIUM 40 MG PO TBEC
40.0000 mg | DELAYED_RELEASE_TABLET | Freq: Two times a day (BID) | ORAL | Status: DC
  Administered 2015-06-09 – 2015-06-14 (×10): 40 mg via ORAL
  Filled 2015-06-09 (×10): qty 1

## 2015-06-09 MED ORDER — SODIUM POLYSTYRENE SULFONATE 15 GM/60ML PO SUSP
15.0000 g | Freq: Once | ORAL | Status: AC
  Administered 2015-06-09: 15 g via ORAL
  Filled 2015-06-09: qty 60

## 2015-06-09 MED ORDER — CALCIUM GLUCONATE 10 % IV SOLN
1.0000 g | Freq: Once | INTRAVENOUS | Status: AC
  Administered 2015-06-09: 1 g via INTRAVENOUS
  Filled 2015-06-09: qty 10

## 2015-06-09 MED ORDER — DEXTROSE 50 % IV SOLN
1.0000 | Freq: Once | INTRAVENOUS | Status: AC
  Administered 2015-06-09: 50 mL via INTRAVENOUS
  Filled 2015-06-09: qty 50

## 2015-06-09 MED ORDER — FUROSEMIDE 10 MG/ML IJ SOLN
40.0000 mg | Freq: Once | INTRAMUSCULAR | Status: AC
  Administered 2015-06-09: 40 mg via INTRAVENOUS
  Filled 2015-06-09: qty 4

## 2015-06-09 MED ORDER — MOMETASONE FURO-FORMOTEROL FUM 100-5 MCG/ACT IN AERO
2.0000 | INHALATION_SPRAY | Freq: Two times a day (BID) | RESPIRATORY_TRACT | Status: DC
  Administered 2015-06-09 – 2015-06-14 (×9): 2 via RESPIRATORY_TRACT
  Filled 2015-06-09 (×2): qty 8.8

## 2015-06-09 MED ORDER — LABETALOL HCL 5 MG/ML IV SOLN: 10.0000 mg | INTRAVENOUS | Status: DC | PRN

## 2015-06-09 MED ORDER — ALBUTEROL SULFATE (2.5 MG/3ML) 0.083% IN NEBU
3.0000 mL | INHALATION_SOLUTION | RESPIRATORY_TRACT | Status: DC | PRN
  Administered 2015-06-13: 3 mL via RESPIRATORY_TRACT
  Filled 2015-06-09: qty 3

## 2015-06-09 MED ORDER — TRAZODONE HCL 50 MG PO TABS
50.0000 mg | ORAL_TABLET | Freq: Every day | ORAL | Status: DC
  Administered 2015-06-09 – 2015-06-13 (×5): 50 mg via ORAL
  Filled 2015-06-09 (×5): qty 1

## 2015-06-09 MED ORDER — ASPIRIN 81 MG PO CHEW
324.0000 mg | CHEWABLE_TABLET | Freq: Once | ORAL | Status: AC
  Administered 2015-06-09: 324 mg via ORAL
  Filled 2015-06-09: qty 4

## 2015-06-09 MED ORDER — INSULIN ASPART 100 UNIT/ML ~~LOC~~ SOLN
0.0000 [IU] | Freq: Three times a day (TID) | SUBCUTANEOUS | Status: DC
  Administered 2015-06-09: 2 [IU] via SUBCUTANEOUS
  Administered 2015-06-10: 1 [IU] via SUBCUTANEOUS
  Administered 2015-06-10: 5 [IU] via SUBCUTANEOUS
  Administered 2015-06-11: 3 [IU] via SUBCUTANEOUS
  Administered 2015-06-11 – 2015-06-12 (×3): 1 [IU] via SUBCUTANEOUS
  Administered 2015-06-13 – 2015-06-14 (×2): 2 [IU] via SUBCUTANEOUS
  Administered 2015-06-14: 1 [IU] via SUBCUTANEOUS
  Filled 2015-06-09: qty 2
  Filled 2015-06-09: qty 5
  Filled 2015-06-09 (×2): qty 1
  Filled 2015-06-09: qty 2
  Filled 2015-06-09: qty 3
  Filled 2015-06-09 (×2): qty 1
  Filled 2015-06-09: qty 2
  Filled 2015-06-09: qty 1

## 2015-06-09 MED ORDER — ONDANSETRON HCL 4 MG PO TABS: 4.0000 mg | ORAL_TABLET | Freq: Four times a day (QID) | ORAL | Status: DC | PRN

## 2015-06-09 MED ORDER — BUSPIRONE HCL 5 MG PO TABS
10.0000 mg | ORAL_TABLET | Freq: Two times a day (BID) | ORAL | Status: DC
  Administered 2015-06-09 – 2015-06-13 (×10): 10 mg via ORAL
  Filled 2015-06-09 (×2): qty 2
  Filled 2015-06-09 (×2): qty 1
  Filled 2015-06-09 (×2): qty 2
  Filled 2015-06-09: qty 1
  Filled 2015-06-09: qty 2
  Filled 2015-06-09: qty 1
  Filled 2015-06-09: qty 2
  Filled 2015-06-09: qty 1

## 2015-06-09 MED ORDER — LAMOTRIGINE 100 MG PO TABS
100.0000 mg | ORAL_TABLET | Freq: Two times a day (BID) | ORAL | Status: DC
  Administered 2015-06-09 – 2015-06-13 (×10): 100 mg via ORAL
  Filled 2015-06-09 (×10): qty 1

## 2015-06-09 MED ORDER — IPRATROPIUM-ALBUTEROL 0.5-2.5 (3) MG/3ML IN SOLN
3.0000 mL | RESPIRATORY_TRACT | Status: DC | PRN
  Administered 2015-06-10 – 2015-06-25 (×4): 3 mL via RESPIRATORY_TRACT
  Filled 2015-06-09 (×5): qty 3

## 2015-06-09 MED ORDER — NITROGLYCERIN 2 % TD OINT
1.0000 [in_us] | TOPICAL_OINTMENT | Freq: Once | TRANSDERMAL | Status: AC
  Administered 2015-06-09: 1 [in_us] via TOPICAL
  Filled 2015-06-09: qty 1

## 2015-06-09 MED ORDER — ONDANSETRON HCL 4 MG/2ML IJ SOLN
4.0000 mg | Freq: Four times a day (QID) | INTRAMUSCULAR | Status: DC | PRN
  Administered 2015-06-09 – 2015-06-27 (×4): 4 mg via INTRAVENOUS
  Filled 2015-06-09 (×4): qty 2

## 2015-06-09 MED ORDER — HYDRALAZINE HCL 50 MG PO TABS
50.0000 mg | ORAL_TABLET | Freq: Three times a day (TID) | ORAL | Status: DC
  Administered 2015-06-09 – 2015-06-13 (×13): 50 mg via ORAL
  Filled 2015-06-09 (×13): qty 1

## 2015-06-09 MED ORDER — DILTIAZEM HCL ER COATED BEADS 180 MG PO CP24
180.0000 mg | ORAL_CAPSULE | Freq: Every day | ORAL | Status: DC
  Administered 2015-06-09 – 2015-06-13 (×5): 180 mg via ORAL
  Filled 2015-06-09 (×5): qty 1

## 2015-06-09 NOTE — Progress Notes (Signed)
Initial Nutrition Assessment   INTERVENTION:   Meals and Snacks: Cater to patient preferences. RD aided pt with lunch order today.   NUTRITION DIAGNOSIS:   No nutrition diagnosis at this time.  GOAL:   Patient will meet greater than or equal to 90% of their needs  MONITOR:    (Energy Intake, Anthropometrics, Digestive System, glucose Profile)  REASON FOR ASSESSMENT:   Malnutrition Screening Tool, Diagnosis    ASSESSMENT:   Pt admitted with SOB secondary to acute CHF. Pt just moved from ICU to 2A on visit.  Past Medical History  Diagnosis Date  . Heart disease   . Atrial fibrillation ([place])     a. reported a-fib; b. unknown chronicity; c. dates back to 1970s; d. not on long term anticoagulation 2/2 no documented a-fib  . Hypertension   . High cholesterol   . Diabetes ([place])     borderline  . COPD (chronic obstructive pulmonary disease) ([place])   . Melanoma ([place])   . Kidney disorder   . Tremor   . Autoimmune disease ([place])   . Depressed   . Anxiety disorder   . Migraine   . Dementia   . Motion sickness   . Myasthenia gravis ([place])   . Bipolar disorder ([place])   . Dementia   . Acute renal failure ([place])   . Near syncope     Diet Order:  Diet 2 gram sodium Room service appropriate?: Yes; Fluid consistency:: Thin    Current Nutrition: Pt reports eating breakfast this am; recorded 75% of meal. Pt ordering tuna salad sandwich with jello for lunch today.   Food/Nutrition-Related History: Pt reports appetite has been good PTA.   Medications: Protonix, Novolog, Lasix, buspar  Electrolyte/Renal Profile and Glucose Profile:   Recent Labs Lab 06/08/15 2310 06/09/15 0527  NA 135 143  K 6.2* 4.3  CL 102 104  CO2 25 31  BUN 25* 25*  CREATININE 2.15* 1.96*  CALCIUM 8.8* 9.2  MG  --  2.0  PHOS  --  4.5  GLUCOSE 236* 113*   Protein Profile:  Recent Labs Lab 06/08/15 2310  ALBUMIN 3.2*    Gastrointestinal Profile: Last BM:  06/07/2015   Weight Change: Pt  reports weight gain recently; UBW of 135lbs   Height:   Ht Readings from Last 1 Encounters:  06/09/15 5\' 4"  (1.626 m)    Weight:   Wt Readings from Last 1 Encounters:  06/09/15 144 lb (65.318 kg)    BMI:  Body mass index is 24.71 kg/(m^2).   EDUCATION NEEDS:   No education needs identified at this time RD asked pt if she was familiar with low sodium recommendations to which pt reports being familiar and watching her sodium intake PTA.     LOW Care Level  Dwyane Luo, [place], [place] Pager [phone]

## 2015-06-09 NOTE — Progress Notes (Signed)
4 L of oxygen. Tachy. FS are stable. A & O. Takes meds ok. Reported nausea and cough and received meds for that. BSC and tolearted it well. EF 35-40%. Pt has no further concerns at this time.

## 2015-06-09 NOTE — Progress Notes (Signed)
   06/09/15 0945  Clinical Encounter Type  Visited With Patient  Visit Type Spiritual support  Referral From Nurse  Consult/Referral To Chaplain  Was referred to offer information on advanced directive. AD education complete. Patient wasn't ready to proceed.

## 2015-06-09 NOTE — Progress Notes (Signed)
*  PRELIMINARY RESULTS* Echocardiogram 2D Echocardiogram has been performed.  Mackenzie Rose 06/09/2015, 8:58 AM

## 2015-06-09 NOTE — H&P (Signed)
[facility] [place] NAME: Mackenzie Rose    MR#:  [id number]  DATE OF BIRTH:  20-Aug-1946  DATE OF ADMISSION:  06/08/2015  PRIMARY CARE PHYSICIAN: Donato Schultz, MD   REQUESTING/REFERRING PHYSICIAN: Dahlia Client, MD  CHIEF COMPLAINT:   Chief Complaint  Patient presents with  . Respiratory Distress    HISTORY OF PRESENT ILLNESS:  Mackenzie Rose  is a 69 y.o. female who presents with shortness of breath, worsening over the last 3-4 days. Patient states that she's also had some intermittent chest pains throughout the same time. She states that she's had some episodes of acute CHF in the past, but denies being chronically followed for CHF. She does have a history of A. fib, but no overt history of CAD. Evaluation in the ED today, she was short of breath requiring administration of BiPAP. BNP significantly elevated to 2500, and chest x-ray consistent with congestive heart failure. Troponin was also elevated to 0.12. She has had an elevated troponin the past, was evaluated by cardiology who felt that her troponin was likely demand ischemia. Of note, she also has COPD likely diminishing her baseline respiratory reserve. Hospitalists were called for admission for acute CHF  PAST MEDICAL HISTORY:   Past Medical History  Diagnosis Date  . Heart disease   . Atrial fibrillation     a. reported a-fib; b. unknown chronicity; c. dates back to 1970s; d. not on long term anticoagulation 2/2 no documented a-fib  . Hypertension   . High cholesterol   . Diabetes     borderline  . COPD (chronic obstructive pulmonary disease)   . Melanoma   . Kidney disorder   . Tremor   . Autoimmune disease   . Depressed   . Anxiety disorder   . Migraine   . Dementia   . Motion sickness   . Myasthenia gravis   . Bipolar disorder   . Dementia   . Acute renal failure   . Near syncope     PAST SURGICAL HISTORY:   Past Surgical History  Procedure Laterality  Date  . Tubal ligation    . Retena repair    . Cataract extraction    . Cholecystectomy    . Esophagogastroduodenoscopy (egd) with propofol N/A 05/22/2015    Procedure: ESOPHAGOGASTRODUODENOSCOPY (EGD) WITH PROPOFOL;  Surgeon: Lucilla Lame, MD;  Location: [facility] ENDOSCOPY;  Service: Endoscopy;  Laterality: N/A;    SOCIAL HISTORY:   Social History  Substance Use Topics  . Smoking status: Current Some Day Smoker -- 2.00 packs/day for 50 years    Types: Cigarettes    Start date: 03/28/1965  . Smokeless tobacco: Never Used  . Alcohol Use: No     Comment: Occasionally    FAMILY HISTORY:   Family History  Problem Relation Age of Onset  . Heart disease Father   . Other Mother     ruptured hernia  . Breast cancer Sister   . Heart disease Sister   . Diabetes Mellitus II Sister     x2    DRUG ALLERGIES:   Allergies  Allergen Reactions  . Lactose Intolerance (Gi) Other (See Comments)    Reaction:  GI upset   . Lithium Other (See Comments)    Reaction:  Unknown   . Penicillins Rash    MEDICATIONS AT HOME:   Prior to Admission medications   Medication Sig Start Date End Date Taking? Authorizing Provider  acetaminophen (TYLENOL) 650 MG CR  tablet Take 650 mg by mouth daily. Pt takes daily at 2pm and every four hours as needed for pain.   Yes Historical Provider, MD  albuterol (PROVENTIL HFA;VENTOLIN HFA) 108 (90 BASE) MCG/ACT inhaler Inhale 2 puffs into the lungs every 4 (four) hours as needed for wheezing or shortness of breath. 03/25/15  Yes Srikar Sudini, MD  bismuth subsalicylate (PEPTO BISMOL) 262 MG/15ML suspension Take 5 mLs by mouth 4 (four) times daily as needed for diarrhea or loose stools (or nausea).   Yes Historical Provider, MD  busPIRone (BUSPAR) 10 MG tablet Take 10 mg by mouth 2 (two) times daily.  04/23/15  Yes Historical Provider, MD  Dextromethorphan-Benzocaine (SORE THROAT & COUGH LOZENGES MT) Use as directed 1 lozenge in the mouth or throat 4 (four) times daily  as needed (for cough).   Yes Historical Provider, MD  diclofenac sodium (VOLTAREN) 1 % GEL Apply 2 g topically 2 (two) times daily.    Yes Historical Provider, MD  diltiazem (CARDIZEM CD) 180 MG 24 hr capsule Take 180 mg by mouth daily.    Yes Historical Provider, MD  donepezil (ARICEPT) 10 MG tablet Take 1 tablet (10 mg total) by mouth daily. 03/29/15  Yes Rainey Pines, MD  doxycycline (VIBRAMYCIN) 50 MG capsule Take 1 capsule by mouth 2 (two) times daily. 05/24/15  Yes Historical Provider, MD  ferrous sulfate 325 (65 FE) MG tablet Take 1 tablet (325 mg total) by mouth 2 (two) times daily with a meal. 05/23/15  Yes Gladstone Lighter, MD  Fluticasone-Salmeterol (ADVAIR) 250-50 MCG/DOSE AEPB Inhale 1 puff into the lungs 2 (two) times daily.   Yes Historical Provider, MD  glimepiride (AMARYL) 1 MG tablet Take 1 tablet by mouth 2 (two) times daily. 05/24/15  Yes Historical Provider, MD  guaifenesin (ROBITUSSIN) 100 MG/5ML syrup Take 100 mg by mouth 2 (two) times daily as needed for cough.   Yes Historical Provider, MD  hydrALAZINE (APRESOLINE) 50 MG tablet Take 1 tablet (50 mg total) by mouth 3 (three) times daily. 05/23/15  Yes Gladstone Lighter, MD  lamoTRIgine (LAMICTAL) 100 MG tablet Take 100 mg by mouth 2 (two) times daily.   Yes Historical Provider, MD  latanoprost (XALATAN) 0.005 % ophthalmic solution Place 1 drop into both eyes at bedtime.    Yes Historical Provider, MD  nicotine (NICODERM CQ - DOSED IN MG/24 HR) 7 mg/24hr patch Place 1 patch (7 mg total) onto the skin daily. 03/25/15  Yes Srikar Sudini, MD  ondansetron (ZOFRAN) 4 MG tablet Take 1 tablet (4 mg total) by mouth every 8 (eight) hours as needed for nausea or vomiting. 05/18/15  Yes Fritzi Mandes, MD  pantoprazole (PROTONIX) 40 MG tablet Take 1 tablet (40 mg total) by mouth 2 (two) times daily before a meal. 05/23/15  Yes Gladstone Lighter, MD  phenylephrine-shark liver oil-mineral oil-petrolatum (PREPARATION H) 0.25-3-14-71.9 % rectal ointment  Place 1 application rectally 2 (two) times daily as needed for hemorrhoids.   Yes Historical Provider, MD  polyethylene glycol (MIRALAX / GLYCOLAX) packet Take 17 g by mouth daily as needed for mild constipation.   Yes Historical Provider, MD  potassium chloride SA (K-DUR,KLOR-CON) 20 MEQ tablet Take 20 mEq by mouth daily.   Yes Historical Provider, MD  predniSONE (DELTASONE) 10 MG tablet Take 30 mg by mouth every other day.   Yes Historical Provider, MD  pyridostigmine (MESTINON) 60 MG tablet Take 60 mg by mouth 3 (three) times daily.    Yes Historical Provider, MD  QUEtiapine (  SEROQUEL) 200 MG tablet Take 1 tablet (200 mg total) by mouth at bedtime. 03/29/15  Yes Rainey Pines, MD  sodium chloride (OCEAN) 0.65 % SOLN nasal spray Place 2 sprays into both nostrils daily as needed for congestion.    Yes Historical Provider, MD  traMADol (ULTRAM) 50 MG tablet Take 50 mg by mouth 2 (two) times daily as needed.   Yes Historical Provider, MD  traZODone (DESYREL) 50 MG tablet Take 1 tablet (50 mg total) by mouth at bedtime. 05/23/15  Yes Gladstone Lighter, MD    REVIEW OF SYSTEMS:  Review of Systems  Constitutional: Negative for fever, chills, weight loss and malaise/fatigue.  HENT: Negative for ear pain, hearing loss and tinnitus.   Eyes: Negative for blurred vision, double vision, pain and redness.  Respiratory: Positive for shortness of breath and wheezing. Negative for cough and hemoptysis.   Cardiovascular: Positive for chest pain and leg swelling. Negative for palpitations and orthopnea.  Gastrointestinal: Negative for nausea, vomiting, abdominal pain, diarrhea and constipation.  Genitourinary: Negative for dysuria, frequency and hematuria.  Musculoskeletal: Negative for back pain, joint pain and neck pain.  Skin:       No acne, rash, or lesions  Neurological: Negative for dizziness, tremors, focal weakness and weakness.  Endo/Heme/Allergies: Negative for polydipsia. Does not bruise/bleed easily.   Psychiatric/Behavioral: Negative for depression. The patient is not nervous/anxious and does not have insomnia.      VITAL SIGNS:   Filed Vitals:   06/08/15 2303 06/09/15 0014 06/09/15 0031 06/09/15 0042  BP:  163/97 165/79   Pulse: 115 115 113   Temp: 99.1 F (37.3 C)     TempSrc: Oral     Resp: 28 24 22    SpO2: 96% 100% 100% 100%   Wt Readings from Last 3 Encounters:  05/23/15 61.281 kg (135 lb 1.6 oz)  05/20/15 59.739 kg (131 lb 11.2 oz)  04/25/15 58.695 kg (129 lb 6.4 oz)    PHYSICAL EXAMINATION:  Physical Exam  Vitals reviewed. Constitutional: She is oriented to person, place, and time. She appears well-developed and well-nourished. No distress.  HENT:  Head: Normocephalic and atraumatic.  Mouth/Throat: Oropharynx is clear and moist.  Eyes: Conjunctivae and EOM are normal. Pupils are equal, round, and reactive to light. No scleral icterus.  Neck: Normal range of motion. Neck supple. No JVD present. No thyromegaly present.  Cardiovascular: Regular rhythm and intact distal pulses.  Exam reveals no gallop and no friction rub.   No murmur heard. Tachycardic  Respiratory: Effort normal. No respiratory distress. She has wheezes. She has rales.  GI: Soft. Bowel sounds are normal. She exhibits no distension. There is no tenderness.  Musculoskeletal: Normal range of motion. She exhibits no edema.  No arthritis, no gout  Lymphadenopathy:    She has no cervical adenopathy.  Neurological: She is alert and oriented to person, place, and time. No cranial nerve deficit.  No dysarthria, no aphasia  Skin: Skin is warm and dry. No rash noted. No erythema.  Psychiatric: She has a normal mood and affect. Her behavior is normal. Judgment and thought content normal.    LABORATORY PANEL:   CBC  Recent Labs Lab 06/08/15 2310  WBC 9.4  HGB 10.1*  HCT 33.4*  PLT 259    ------------------------------------------------------------------------------------------------------------------  Chemistries   Recent Labs Lab 06/08/15 2310  NA 135  K 6.2*  CL 102  CO2 25  GLUCOSE 236*  BUN 25*  CREATININE 2.15*  CALCIUM 8.8*  AST 26  ALT 18  ALKPHOS 114  BILITOT 0.7   ------------------------------------------------------------------------------------------------------------------  Cardiac Enzymes  Recent Labs Lab 06/08/15 2310  TROPONINI 0.12*   ------------------------------------------------------------------------------------------------------------------  RADIOLOGY:  Dg Chest Portable 1 View  06/08/2015   CLINICAL DATA:  Shortness of breath  EXAM: PORTABLE CHEST 1 VIEW  COMPARISON:  05/20/2015  FINDINGS: Chronic cardiopericardial enlargement. There is chronic vascular pedicle widening and aortic tortuosity with superimposed hiatal hernia. Trace left pleural effusion. Pulmonary venous congestion and mild pulmonary edema. No air leak.  IMPRESSION: CHF pattern   Electronically Signed   By: Monte Fantasia M.D.   On: 06/08/2015 23:56    EKG:   Orders placed or performed during the hospital encounter of 06/08/15  . EKG 12-Lead  . EKG 12-Lead  . ED EKG  . ED EKG    IMPRESSION AND PLAN:  Principal Problem:   Acute systolic CHF (congestive heart failure) - given IV Lasix in the ED, and placed on BiPAP. We will admit the patient to stepdown and she is currently on BiPAP. We'll monitor her response from IV Lasix given in the ED as she is likely Lasix states that she does not take this medication at home. We will administer further doses of Lasix as necessary, though will do so cautiously given her acute renal failure. See below for treatment of this problem. Ordered a cardiology consult and an echocardiogram. We'll also trend her troponins. Active Problems:   ARF (acute renal failure) - unclear etiology at this time, concern for the possibility of  a primary cardiac event leading to secondary renal failure. We are diuresing cautiously this time. May need to get nephrology involved at some point given the delicate balance between her need for diuresis for CHF, and potential need for fluid hydration to her kidneys as I suspect that her renal failure is due to prerenal causes.   Myasthenia gravis - continue home meds for this   HTN (hypertension) - elevated here, continue home antihypertensives, diuresis may help with this some. Can use additional IV here antihypertensives when necessary   Type 2 diabetes mellitus - sliding scale and some appropriate fingerstick glucose checks every 6 hours for now while nothing by mouth, will need to switch to before meals and at bedtime with carb modified diet when patient is eating again. She is nothing by mouth now for possibility of there may be some cardiac procedure in the morning based on results of her current workup.   COPD (chronic obstructive pulmonary disease) - continue home inhalers, when necessary DuoNeb's   Dementia - continue home meds for this   Bipolar affective disorder, mixed - continue home meds for this  All the records are reviewed and case discussed with ED provider. Management plans discussed with the patient and/or family.  DVT PROPHYLAXIS: SubQ heparin  ADMISSION STATUS: Inpatient  CODE STATUS: Full  TOTAL TIME TAKING CARE OF THIS PATIENT: 45 minutes.    Hershey Knauer FIELDING 06/09/2015, 1:01 AM  [facility]  Office  [phone]  CC: Primary care physician; Donato Schultz, MD

## 2015-06-09 NOTE — ED Provider Notes (Signed)
[facility] Emergency Department Provider Note  ____________________________________________  Time seen: Approximately 2311 AM  I have reviewed the triage vital signs and the nursing notes.   HISTORY  Chief Complaint Respiratory Distress    HPI Mackenzie Rose is a 69 y.o. female who comes in to the hospital reporting that she is unable to breathe. The patient reports these symptoms for a few days. She reports that she's also had pneumonia and has been on 2 rounds of antibiotics. The patient has myasthenia gravis and is unsure if this may be due to her myasthenia gravis. The patient denies any chest pain but reports her stomach is tight and it makes her feel as if she can't breathe. The patient reports that Dr. Tamala Julian is the one who gave her her second round of antibiotics but she does not remember the name of her primary care doctor. She reports that she does not know the name of the medication she was on and she vomited them up. She reports that she does not wear any oxygen at home daily but has worn oxygen in the past. The patient simply says that she is unable to breathe and she is unable to think. She does have a mild headache from her difficulty breathing. The patient came in for further evaluation.The patient thinks she has been using her inhalers and she reports it does not help with her symptoms. She does not report anything that seems to make her symptoms worse either.   Past Medical History  Diagnosis Date  . Heart disease   . Atrial fibrillation     a. reported a-fib; b. unknown chronicity; c. dates back to 1970s; d. not on long term anticoagulation 2/2 no documented a-fib  . Hypertension   . High cholesterol   . Diabetes     borderline  . COPD (chronic obstructive pulmonary disease)   . Melanoma   . Kidney disorder   . Tremor   . Autoimmune disease   . Depressed   . Anxiety disorder   . Migraine   . Dementia   . Motion sickness   . Myasthenia  gravis   . Bipolar disorder   . Dementia   . Acute renal failure   . Near syncope     Patient Active Problem List   Diagnosis Date Noted  . Blood in stool   . Anemia 05/21/2015  . Acute GI bleeding   . Symptomatic anemia   . Syncope 05/17/2015  . Near syncope 03/25/2015  . ARF (acute renal failure) 03/25/2015  . COPD exacerbation 03/25/2015  . Anxiety 12/12/2014  . Bipolar affective disorder, mixed 12/12/2014  . Renal insufficiency syndrome 12/12/2014  . H/O: HTN (hypertension) 12/12/2014  . H/O diabetes mellitus 12/12/2014  . CAFL (chronic airflow limitation) 12/12/2014  . Bipolar disorder   . Dementia   . Ulnar neuropathy at elbow of right upper extremity 09/22/2014  . Myasthenia gravis 11/22/2013    Past Surgical History  Procedure Laterality Date  . Tubal ligation    . Retena repair    . Cataract extraction    . Cholecystectomy    . Esophagogastroduodenoscopy (egd) with propofol N/A 05/22/2015    Procedure: ESOPHAGOGASTRODUODENOSCOPY (EGD) WITH PROPOFOL;  Surgeon: Lucilla Lame, MD;  Location: [facility] ENDOSCOPY;  Service: Endoscopy;  Laterality: N/A;    Current Outpatient Rx  Name  Route  Sig  Dispense  Refill  . acetaminophen (TYLENOL) 650 MG CR tablet   Oral   Take 650 mg by  mouth daily. Pt takes daily at 2pm and every four hours as needed for pain.         Marland Kitchen albuterol (PROVENTIL HFA;VENTOLIN HFA) 108 (90 BASE) MCG/ACT inhaler   Inhalation   Inhale 2 puffs into the lungs every 4 (four) hours as needed for wheezing or shortness of breath.   6.7 g   1   . bismuth subsalicylate (PEPTO BISMOL) 262 MG/15ML suspension   Oral   Take 5 mLs by mouth 4 (four) times daily as needed for diarrhea or loose stools (or nausea).         . busPIRone (BUSPAR) 10 MG tablet   Oral   Take 10 mg by mouth 2 (two) times daily.          Marland Kitchen Dextromethorphan-Benzocaine (SORE THROAT & COUGH LOZENGES MT)   Mouth/Throat   Use as directed 1 lozenge in the mouth or throat 4 (four)  times daily as needed (for cough).         . diclofenac sodium (VOLTAREN) 1 % GEL   Topical   Apply 2 g topically 2 (two) times daily.          Marland Kitchen diltiazem (CARDIZEM CD) 180 MG 24 hr capsule   Oral   Take 180 mg by mouth daily.          Marland Kitchen donepezil (ARICEPT) 10 MG tablet   Oral   Take 1 tablet (10 mg total) by mouth daily.   30 tablet   3   . doxycycline (VIBRAMYCIN) 50 MG capsule   Oral   Take 1 capsule by mouth 2 (two) times daily.         . ferrous sulfate 325 (65 FE) MG tablet   Oral   Take 1 tablet (325 mg total) by mouth 2 (two) times daily with a meal.   60 tablet   3   . Fluticasone-Salmeterol (ADVAIR) 250-50 MCG/DOSE AEPB   Inhalation   Inhale 1 puff into the lungs 2 (two) times daily.         Marland Kitchen glimepiride (AMARYL) 1 MG tablet   Oral   Take 1 tablet by mouth 2 (two) times daily.         Marland Kitchen guaifenesin (ROBITUSSIN) 100 MG/5ML syrup   Oral   Take 100 mg by mouth 2 (two) times daily as needed for cough.         . hydrALAZINE (APRESOLINE) 50 MG tablet   Oral   Take 1 tablet (50 mg total) by mouth 3 (three) times daily.   90 tablet   0   . lamoTRIgine (LAMICTAL) 100 MG tablet   Oral   Take 100 mg by mouth 2 (two) times daily.         Marland Kitchen latanoprost (XALATAN) 0.005 % ophthalmic solution   Both Eyes   Place 1 drop into both eyes at bedtime.          . nicotine (NICODERM CQ - DOSED IN MG/24 HR) 7 mg/24hr patch   Transdermal   Place 1 patch (7 mg total) onto the skin daily.   15 patch   0   . ondansetron (ZOFRAN) 4 MG tablet   Oral   Take 1 tablet (4 mg total) by mouth every 8 (eight) hours as needed for nausea or vomiting.   20 tablet   0   . pantoprazole (PROTONIX) 40 MG tablet   Oral   Take 1 tablet (40 mg total) by mouth 2 (two) times  daily before a meal.   60 tablet   2   . phenylephrine-shark liver oil-mineral oil-petrolatum (PREPARATION H) 0.25-3-14-71.9 % rectal ointment   Rectal   Place 1 application rectally 2 (two)  times daily as needed for hemorrhoids.         . polyethylene glycol (MIRALAX / GLYCOLAX) packet   Oral   Take 17 g by mouth daily as needed for mild constipation.         . potassium chloride SA (K-DUR,KLOR-CON) 20 MEQ tablet   Oral   Take 20 mEq by mouth daily.         . predniSONE (DELTASONE) 10 MG tablet   Oral   Take 30 mg by mouth every other day.         . pyridostigmine (MESTINON) 60 MG tablet   Oral   Take 60 mg by mouth 3 (three) times daily.          . QUEtiapine (SEROQUEL) 200 MG tablet   Oral   Take 1 tablet (200 mg total) by mouth at bedtime.   30 tablet   3   . sodium chloride (OCEAN) 0.65 % SOLN nasal spray   Each Nare   Place 2 sprays into both nostrils daily as needed for congestion.          . traMADol (ULTRAM) 50 MG tablet   Oral   Take 50 mg by mouth 2 (two) times daily as needed.         . traZODone (DESYREL) 50 MG tablet   Oral   Take 1 tablet (50 mg total) by mouth at bedtime.   30 tablet   0     Allergies Lactose intolerance (gi); Lithium; and Penicillins  Family History  Problem Relation Age of Onset  . Heart disease Father   . Other Mother     ruptured hernia  . Breast cancer Sister   . Heart disease Sister   . Diabetes Mellitus II Sister     x2    Social History Social History  Substance Use Topics  . Smoking status: Current Some Day Smoker -- 2.00 packs/day for 50 years    Types: Cigarettes    Start date: 03/28/1965  . Smokeless tobacco: Never Used  . Alcohol Use: No     Comment: Occasionally    Review of Systems Constitutional: No fever/chills Eyes: No visual changes. ENT: No sore throat. Cardiovascular: Denies chest pain. Respiratory: shortness of breath. Gastrointestinal: Abdominal tightness and intermittent vomiting  No diarrhea.  No constipation. Genitourinary: Negative for dysuria. Musculoskeletal: Negative for back pain. Skin: Negative for rash. Neurological: Headache  10-point ROS otherwise  negative.  ____________________________________________   PHYSICAL EXAM:  VITAL SIGNS: ED Triage Vitals  Enc Vitals Group     BP --      Pulse Rate 06/08/15 2303 115     Resp 06/08/15 2303 28     Temp 06/08/15 2303 99.1 F (37.3 C)     Temp Source 06/08/15 2303 Oral     SpO2 06/08/15 2303 96 %     Weight --      Height --      Head Cir --      Peak Flow --      Pain Score 06/08/15 2308 8     Pain Loc --      Pain Edu? --      Excl. in [place]? --     Constitutional: Alert and oriented. Well appearing and  in moderate distress. Eyes: Conjunctivae are normal. PERRL. EOMI. Head: Atraumatic. Nose: No congestion/rhinnorhea. Mouth/Throat: Mucous membranes are moist.  Oropharynx non-erythematous. Cardiovascular: Tachycardia, regular rhythm. Grossly normal heart sounds.  Good peripheral circulation. Respiratory: Increased respiratory effort with crackles in her bilateral bases Gastrointestinal: Soft and nontender. No distention. Positive bowel sounds Musculoskeletal: Bilateral lower extremity pitting edema   Neurologic:  Normal speech and language.  Skin:  Skin is warm, dry and intact.  Psychiatric: Mood and affect are normal.   ____________________________________________   LABS (all labs ordered are listed, but only abnormal results are displayed)  Labs Reviewed  CBC - Abnormal; Notable for the following:    Hemoglobin 10.1 (*)    HCT 33.4 (*)    MCH 25.0 (*)    MCHC 30.1 (*)    RDW 28.7 (*)    All other components within normal limits  COMPREHENSIVE METABOLIC PANEL - Abnormal; Notable for the following:    Potassium 6.2 (*)    Glucose, Bld 236 (*)    BUN 25 (*)    Creatinine, Ser 2.15 (*)    Calcium 8.8 (*)    Total Protein 6.1 (*)    Albumin 3.2 (*)    GFR calc non Af Amer 22 (*)    GFR calc Af Amer 25 (*)    All other components within normal limits  TROPONIN I - Abnormal; Notable for the following:    Troponin I 0.12 (*)    All other components within normal  limits  BRAIN NATRIURETIC PEPTIDE - Abnormal; Notable for the following:    B Natriuretic Peptide 2525.0 (*)    All other components within normal limits  BLOOD GAS, ARTERIAL - Abnormal; Notable for the following:    pH, Arterial 7.46 (*)    pO2, Arterial 141 (*)    All other components within normal limits  LACTIC ACID, PLASMA  LACTIC ACID, PLASMA   ____________________________________________  EKG  ED ECG REPORT I, Loney Hering, the attending physician, personally viewed and interpreted this ECG.   Date: 06/08/2015  EKG Time: 2304  Rate: 114  Rhythm: sinus tachycardia  Axis: normal  Intervals:right bundle branch block and left anterior fascicular block  ST&T Change: none  ____________________________________________  RADIOLOGY  Chest x-ray: CHF pattern ____________________________________________   PROCEDURES  Procedure(s) performed: None  Critical Care performed: Yes, see critical care note(s)  CRITICAL CARE Performed by: Charlesetta Ivory P   Total critical care time: 10min  Critical care time was exclusive of separately billable procedures and treating other patients.  Critical care was necessary to treat or prevent imminent or life-threatening deterioration.  Critical care was time spent personally by me on the following activities: development of treatment plan with patient and/or surrogate as well as nursing, discussions with consultants, evaluation of patient's response to treatment, examination of patient, obtaining history from patient or surrogate, ordering and performing treatments and interventions, ordering and review of laboratory studies, ordering and review of radiographic studies, pulse oximetry and re-evaluation of patient's condition.  ____________________________________________   INITIAL IMPRESSION / ASSESSMENT AND PLAN / ED COURSE  Pertinent labs & imaging results that were available during my care of the patient were reviewed by  me and considered in my medical decision making (see chart for details).  This is a 69 year old female who comes in today with shortness of breath. The patient has been feeling short of breath for multiple days. The patient was tachypnea into the high 20s and 30s when I  initially evaluated her reports that she could not breathe. I did have the patient placed on BiPAP as she did have crackles in her bases. The patient's troponin also did come back at 0.12 which is higher than what it has been in the previous times. Further lab evaluation showed a potassium of 6.2. I gave the patient calcium gluconate, sodium bicarbonate, insulin and dextrose as well as a dose of albuterol. I also give the patient Lasix and the Nitropaste to her chest. The patient appears to have some congestive heart failure which was not one of her previous diagnoses. I will also give the patient a dose of aspirin and admit her to the hospitalist service. I will discuss anticoagulation with the hospitalist to determine the appropriate medication for the patient. ____________________________________________   FINAL CLINICAL IMPRESSION(S) / ED DIAGNOSES  Final diagnoses:  Acute congestive heart failure, unspecified congestive heart failure type  Hyperkalemia  Acute renal insufficiency      Loney Hering, MD 06/09/15 [phone]

## 2015-06-09 NOTE — Progress Notes (Signed)
Skin checked by Crystal M RN 

## 2015-06-09 NOTE — Progress Notes (Addendum)
[place] at [place] NAME: Mackenzie Rose    MR#:  [id number]  DATE OF BIRTH:  Dec 04, 1945  SUBJECTIVE:  Came in with increasing sob and was place don BIPAP. Now off. Feels some better  REVIEW OF SYSTEMS:   Review of Systems  Constitutional: Negative for fever, chills and weight loss.  HENT: Negative for ear discharge, ear pain and nosebleeds.   Eyes: Negative for blurred vision, pain and discharge.  Respiratory: Positive for cough and shortness of breath. Negative for sputum production, wheezing and stridor.   Cardiovascular: Negative for chest pain, palpitations, orthopnea and PND.  Gastrointestinal: Negative for nausea, vomiting, abdominal pain and diarrhea.  Genitourinary: Negative for urgency and frequency.  Musculoskeletal: Negative for back pain and joint pain.  Neurological: Positive for weakness. Negative for sensory change, speech change and focal weakness.  Psychiatric/Behavioral: Negative for depression and hallucinations. The patient is not nervous/anxious.   All other systems reviewed and are negative.  Tolerating Diet:yes Tolerating PT: eval pending  DRUG ALLERGIES:   Allergies  Allergen Reactions  . Lactose Intolerance (Gi) Other (See Comments)    Reaction:  GI upset   . Lithium Other (See Comments)    Reaction:  Unknown   . Penicillins Rash    VITALS:  Blood pressure 147/93, pulse 105, temperature 98.7 F (37.1 C), temperature source Oral, resp. rate 18, height 5\' 4"  (1.626 m), weight 65.318 kg (144 lb), SpO2 99 %.  PHYSICAL EXAMINATION:   Physical Exam  GENERAL:  69 y.o.-year-old patient lying in the bed with no acute distress.  EYES: Pupils equal, round, reactive to light and accommodation. No scleral icterus. Extraocular muscles intact.  HEENT: Head atraumatic, normocephalic. Oropharynx and nasopharynx clear.  NECK:  Supple, no jugular venous distention. No thyroid enlargement, no tenderness.  LUNGS:  decreased breath sounds bilaterally, no wheezing, ++ rales, no rhonchi. No use of accessory muscles of respiration.  CARDIOVASCULAR: S1, S2 normal. No murmurs, rubs, or gallops.  ABDOMEN: Soft, nontender, nondistended. Bowel sounds present. No organomegaly or mass.  EXTREMITIES: No cyanosis, clubbing  ++ edema b/l.    NEUROLOGIC: Cranial nerves II through XII are intact. No focal Motor or sensory deficits b/l.   PSYCHIATRIC: The patient is alert and oriented x 3.  SKIN: No obvious rash, lesion, or ulcer.    LABORATORY PANEL:   CBC  Recent Labs Lab 06/09/15 0527  WBC 6.4  HGB 9.8*  HCT 31.0*  PLT 252    Chemistries   Recent Labs Lab 06/08/15 2310 06/09/15 0527  NA 135 143  K 6.2* 4.3  CL 102 104  CO2 25 31  GLUCOSE 236* 113*  BUN 25* 25*  CREATININE 2.15* 1.96*  CALCIUM 8.8* 9.2  MG  --  2.0  AST 26  --   ALT 18  --   ALKPHOS 114  --   BILITOT 0.7  --     Cardiac Enzymes  Recent Labs Lab 06/08/15 2310  TROPONINI 0.12*    RADIOLOGY:  Dg Chest Portable 1 View  06/08/2015   CLINICAL DATA:  Shortness of breath  EXAM: PORTABLE CHEST 1 VIEW  COMPARISON:  05/20/2015  FINDINGS: Chronic cardiopericardial enlargement. There is chronic vascular pedicle widening and aortic tortuosity with superimposed hiatal hernia. Trace left pleural effusion. Pulmonary venous congestion and mild pulmonary edema. No air leak.  IMPRESSION: CHF pattern   Electronically Signed   By: Monte Fantasia M.D.   On: 06/08/2015 23:56  ASSESSMENT AND PLAN:  69 y.o. female who presents with shortness of breath, worsening over the last 3-4 days. Patient states that she's also had some intermittent chest pains throughout the same time. She states that she's had some episodes of acute CHF in the past, but denies being chronically followed for CHF  1. Acute systolic CHF (congestive heart failure) - given IV Lasix in the ED, and placed on BiPAP. -sats much imporved -off BIPAP -wean  oxygen -changed lasix to IV 20 mg bid today -echo pending  2. Acute on Chronic RF (acute renal failure)  - We are diuresing cautiously this time. May need to get nephrology involved at some point given the delicate balance between her need for diuresis for CHF Baseline creat 1.15.  3. Myasthenia gravis - continue home meds for this  4.HTN (hypertension) - elevated here, continue home antihypertensives, diuresis may help with this some. Can use additional IV here antihypertensives when necessary  5.Type 2 diabetes mellitus - sliding scale   6.COPD (chronic obstructive pulmonary disease) - continue home inhalers, when necessary DuoNeb's  7.Dementia - continue home meds   8.Bipolar affective disorder, mixed - continue home meds  Case discussed with Care Management/Social Worker. Management plans discussed with the patient and  in agreement.  CODE STATUS:FULL  DVT Prophylaxis: lovenox  TOTAL criticalING CARE OF THIS PATIENT: 35inutes.  >50% time spent on counselling and coordination of care  POSSIBLE D/C IN 1-2YS, DEPENDING ON CLINICAL CONDITION.   Christinea Brizuela M.D on 06/09/2015 at 5:56 PM  Between 7am to 6pm - Pager - [phone]  After 6pm go to [email or web address] - password [email or web address] [facility]  Office  [phone]  CC: Primary care physician; Donato Schultz, MD

## 2015-06-10 LAB — BASIC METABOLIC PANEL
ANION GAP: 8 (ref 5–15)
BUN: 33 mg/dL — ABNORMAL HIGH (ref 6–20)
CO2: 33 mmol/L — ABNORMAL HIGH (ref 22–32)
Calcium: 8.6 mg/dL — ABNORMAL LOW (ref 8.9–10.3)
Chloride: 100 mmol/L — ABNORMAL LOW (ref 101–111)
Creatinine, Ser: 1.98 mg/dL — ABNORMAL HIGH (ref 0.44–1.00)
GFR, EST AFRICAN AMERICAN: 28 mL/min — AB (ref >60)
GFR, EST NON AFRICAN AMERICAN: 24 mL/min — AB (ref >60)
Glucose, Bld: 149 mg/dL — ABNORMAL HIGH (ref 65–99)
POTASSIUM: 3.3 mmol/L — AB (ref 3.5–5.1)
SODIUM: 141 mmol/L (ref 135–145)

## 2015-06-10 LAB — GLUCOSE, CAPILLARY
GLUCOSE-CAPILLARY: 142 mg/dL — AB (ref 65–99)
GLUCOSE-CAPILLARY: 295 mg/dL — AB (ref 65–99)
GLUCOSE-CAPILLARY: 115 mg/dL — AB (ref 65–99)
Glucose-Capillary: 70 mg/dL (ref 65–99)

## 2015-06-10 MED ORDER — FUROSEMIDE 20 MG PO TABS: 20.0000 mg | ORAL_TABLET | Freq: Every day | ORAL | Status: DC

## 2015-06-10 MED ORDER — ALPRAZOLAM 0.25 MG PO TABS
0.2500 mg | ORAL_TABLET | Freq: Three times a day (TID) | ORAL | Status: DC | PRN
  Administered 2015-06-10 – 2015-06-11 (×3): 0.25 mg via ORAL
  Filled 2015-06-10 (×3): qty 1

## 2015-06-10 MED ORDER — CARVEDILOL 3.125 MG PO TABS
3.1250 mg | ORAL_TABLET | Freq: Two times a day (BID) | ORAL | Status: DC
  Administered 2015-06-10 – 2015-06-14 (×7): 3.125 mg via ORAL
  Filled 2015-06-10 (×7): qty 1

## 2015-06-10 MED ORDER — FUROSEMIDE 10 MG/ML IJ SOLN
20.0000 mg | Freq: Every day | INTRAMUSCULAR | Status: DC
  Administered 2015-06-10 – 2015-06-11 (×2): 20 mg via INTRAVENOUS
  Filled 2015-06-10 (×2): qty 2

## 2015-06-10 MED ORDER — POTASSIUM CHLORIDE CRYS ER 20 MEQ PO TBCR
20.0000 meq | EXTENDED_RELEASE_TABLET | Freq: Two times a day (BID) | ORAL | Status: DC
  Administered 2015-06-10 – 2015-06-11 (×3): 20 meq via ORAL
  Filled 2015-06-10 (×3): qty 1

## 2015-06-10 NOTE — Consult Note (Signed)
Reason for Consult: Congestive Heart Failure cardiomyopathy renal insufficiency AFib Referring Physician:  Dr. Jannifer Franklin hospitalist  Mackenzie Rose is an 69 y.o. female.  HPI:  Patient has a history of congestive heart failure cardiomyopathy leg edema my seen a dentist bipolar chronic renal insufficiency hypertension diabetes Chronic obstructive pulmonary disease still smoking. Patient presented with history failure placed on BiPAP placed in the unit and had improvement over a few days. Patient had borderline elevated troponin suggestive demand ischemia she has had mild recurrent chest pain but has a known congestive heart failure with cardiomyopathy. Patient in transfer to telemetry AFib is been reasonably controlled no further chest pain shortness of breath is improved still has some leg swelling. Patient complains of weakness blurred vision but is resting comfortably.  Past Medical History  Diagnosis Date  . Heart disease   . Atrial fibrillation ([place])     a. reported a-fib; b. unknown chronicity; c. dates back to 1970s; d. not on long term anticoagulation 2/2 no documented a-fib  . Hypertension   . High cholesterol   . Diabetes ([place])     borderline  . COPD (chronic obstructive pulmonary disease) ([place])   . Melanoma ([place])   . Kidney disorder   . Tremor   . Autoimmune disease ([place])   . Depressed   . Anxiety disorder   . Migraine   . Dementia   . Motion sickness   . Myasthenia gravis ([place])   . Bipolar disorder ([place])   . Dementia   . Acute renal failure ([place])   . Near syncope     Past Surgical History  Procedure Laterality Date  . Tubal ligation    . Retena repair    . Cataract extraction    . Cholecystectomy    . Esophagogastroduodenoscopy (egd) with propofol N/A 05/22/2015    Procedure: ESOPHAGOGASTRODUODENOSCOPY (EGD) WITH PROPOFOL;  Surgeon: Lucilla Lame, MD;  Location: [facility] ENDOSCOPY;  Service: Endoscopy;  Laterality: N/A;    Family History  Problem Relation Age of Onset  .  Heart disease Father   . Other Mother     ruptured hernia  . Breast cancer Sister   . Heart disease Sister   . Diabetes Mellitus II Sister     x2    Social History:  reports that she has been smoking Cigarettes.  She started smoking about 50 years ago. She has a 100 pack-year smoking history. She has never used smokeless tobacco. She reports that she does not drink alcohol or use illicit drugs.  Allergies:  Allergies  Allergen Reactions  . Lactose Intolerance (Gi) Other (See Comments)    Reaction:  GI upset   . Lithium Other (See Comments)    Reaction:  Unknown   . Penicillins Rash    Medications: I have reviewed the patient's current medications.  Results for orders placed or performed during the hospital encounter of 06/08/15 (from the past 48 hour(s))  CBC     Status: Abnormal   Collection Time: 06/08/15 11:10 PM  Result Value Ref Range   WBC 9.4 3.6 - 11.0 K/uL   RBC 4.02 3.80 - 5.20 MIL/uL   Hemoglobin 10.1 (L) 12.0 - 16.0 g/dL    Comment: RESULT REPEATED AND VERIFIED   HCT 33.4 (L) 35.0 - 47.0 %    Comment: RESULT REPEATED AND VERIFIED   MCV 83.0 80.0 - 100.0 fL   MCH 25.0 (L) 26.0 - 34.0 pg   MCHC 30.1 (L) 32.0 - 36.0 g/dL   RDW 28.7 (  H) 11.5 - 14.5 %   Platelets 259 150 - 440 K/uL  Comprehensive metabolic panel     Status: Abnormal   Collection Time: 06/08/15 11:10 PM  Result Value Ref Range   Sodium 135 135 - 145 mmol/L   Potassium 6.2 (H) 3.5 - 5.1 mmol/L   Chloride 102 101 - 111 mmol/L   CO2 25 22 - 32 mmol/L   Glucose, Bld 236 (H) 65 - 99 mg/dL   BUN 25 (H) 6 - 20 mg/dL   Creatinine, Ser 2.15 (H) 0.44 - 1.00 mg/dL   Calcium 8.8 (L) 8.9 - 10.3 mg/dL   Total Protein 6.1 (L) 6.5 - 8.1 g/dL   Albumin 3.2 (L) 3.5 - 5.0 g/dL   AST 26 15 - 41 U/L   ALT 18 14 - 54 U/L   Alkaline Phosphatase 114 38 - 126 U/L   Total Bilirubin 0.7 0.3 - 1.2 mg/dL   GFR calc non Af Amer 22 (L) >60 mL/min   GFR calc Af Amer 25 (L) >60 mL/min    Comment: (NOTE) The eGFR has  been calculated using the CKD EPI equation. This calculation has not been validated in all clinical situations. eGFR's persistently <60 mL/min signify possible Chronic Kidney Disease.    Anion gap 8 5 - 15  Troponin I     Status: Abnormal   Collection Time: 06/08/15 11:10 PM  Result Value Ref Range   Troponin I 0.12 (H) <0.031 ng/mL    Comment: READ BACK AND VERIFIED BY JEANINA RODRIGUEZ AT 2358 ON 06/08/15 [facility]        PERSISTENTLY INCREASED TROPONIN VALUES IN THE RANGE OF 0.04-0.49 ng/mL CAN BE SEEN IN:       -UNSTABLE ANGINA       -CONGESTIVE HEART FAILURE       -MYOCARDITIS       -CHEST TRAUMA       -ARRYHTHMIAS       -LATE PRESENTING MYOCARDIAL INFARCTION       -COPD   CLINICAL FOLLOW-UP RECOMMENDED.   Brain natriuretic peptide     Status: Abnormal   Collection Time: 06/08/15 11:10 PM  Result Value Ref Range   B Natriuretic Peptide 2525.0 (H) 0.0 - 100.0 pg/mL  Lactic acid, plasma     Status: None   Collection Time: 06/08/15 11:10 PM  Result Value Ref Range   Lactic Acid, Venous 1.3 0.5 - 2.0 mmol/L  Blood gas, arterial     Status: Abnormal   Collection Time: 06/09/15 12:03 AM  Result Value Ref Range   FIO2 0.40    Delivery systems BILEVEL POSITIVE AIRWAY PRESSURE    Inspiratory PAP 10    Expiratory PAP 5    pH, Arterial 7.46 (H) 7.350 - 7.450   pCO2 arterial 32 32.0 - 48.0 mmHg   pO2, Arterial 141 (H) 83.0 - 108.0 mmHg   Bicarbonate 22.8 21.0 - 28.0 mEq/L   Acid-base deficit 0.3 0.0 - 2.0 mmol/L   O2 Saturation 99.3 %   Patient temperature 37.0    Collection site RIGHT RADIAL    Sample type ARTERIAL DRAW    Allens test (pass/fail) PASS PASS   Mechanical Rate 10   Glucose, capillary     Status: Abnormal   Collection Time: 06/09/15 12:42 AM  Result Value Ref Range   Glucose-Capillary 228 (H) 65 - 99 mg/dL  Lactic acid, plasma     Status: None   Collection Time: 06/09/15  5:18 AM  Result Value  Ref Range   Lactic Acid, Venous 1.1 0.5 - 2.0 mmol/L  MRSA PCR  Screening     Status: None   Collection Time: 06/09/15  5:26 AM  Result Value Ref Range   MRSA by PCR NEGATIVE NEGATIVE    Comment:        The GeneXpert MRSA Assay (FDA approved for NASAL specimens only), is one component of a comprehensive MRSA colonization surveillance program. It is not intended to diagnose MRSA infection nor to guide or monitor treatment for MRSA infections.   CBC     Status: Abnormal   Collection Time: 06/09/15  5:27 AM  Result Value Ref Range   WBC 6.4 3.6 - 11.0 K/uL   RBC 3.82 3.80 - 5.20 MIL/uL   Hemoglobin 9.8 (L) 12.0 - 16.0 g/dL   HCT 31.0 (L) 35.0 - 47.0 %   MCV 81.1 80.0 - 100.0 fL   MCH 25.7 (L) 26.0 - 34.0 pg   MCHC 31.7 (L) 32.0 - 36.0 g/dL   RDW 28.5 (H) 11.5 - 14.5 %   Platelets 252 150 - 440 K/uL  Magnesium     Status: None   Collection Time: 06/09/15  5:27 AM  Result Value Ref Range   Magnesium 2.0 1.7 - 2.4 mg/dL  Phosphorus     Status: None   Collection Time: 06/09/15  5:27 AM  Result Value Ref Range   Phosphorus 4.5 2.5 - 4.6 mg/dL  Basic metabolic panel     Status: Abnormal   Collection Time: 06/09/15  5:27 AM  Result Value Ref Range   Sodium 143 135 - 145 mmol/L   Potassium 4.3 3.5 - 5.1 mmol/L   Chloride 104 101 - 111 mmol/L   CO2 31 22 - 32 mmol/L   Glucose, Bld 113 (H) 65 - 99 mg/dL   BUN 25 (H) 6 - 20 mg/dL   Creatinine, Ser 1.96 (H) 0.44 - 1.00 mg/dL   Calcium 9.2 8.9 - 10.3 mg/dL   GFR calc non Af Amer 25 (L) >60 mL/min   GFR calc Af Amer 28 (L) >60 mL/min    Comment: (NOTE) The eGFR has been calculated using the CKD EPI equation. This calculation has not been validated in all clinical situations. eGFR's persistently <60 mL/min signify possible Chronic Kidney Disease.    Anion gap 8 5 - 15  Glucose, capillary     Status: Abnormal   Collection Time: 06/09/15  5:52 AM  Result Value Ref Range   Glucose-Capillary 105 (H) 65 - 99 mg/dL  Glucose, capillary     Status: Abnormal   Collection Time: 06/09/15 11:42 AM   Result Value Ref Range   Glucose-Capillary 183 (H) 65 - 99 mg/dL  Glucose, capillary     Status: Abnormal   Collection Time: 06/09/15  4:56 PM  Result Value Ref Range   Glucose-Capillary 138 (H) 65 - 99 mg/dL  Glucose, capillary     Status: Abnormal   Collection Time: 06/09/15  7:39 PM  Result Value Ref Range   Glucose-Capillary 155 (H) 65 - 99 mg/dL   Comment 1 Notify RN   Basic metabolic panel     Status: Abnormal   Collection Time: 06/10/15  5:01 AM  Result Value Ref Range   Sodium 141 135 - 145 mmol/L   Potassium 3.3 (L) 3.5 - 5.1 mmol/L   Chloride 100 (L) 101 - 111 mmol/L   CO2 33 (H) 22 - 32 mmol/L   Glucose, Bld 149 (H) 65 -  99 mg/dL   BUN 33 (H) 6 - 20 mg/dL   Creatinine, Ser 1.98 (H) 0.44 - 1.00 mg/dL   Calcium 8.6 (L) 8.9 - 10.3 mg/dL   GFR calc non Af Amer 24 (L) >60 mL/min   GFR calc Af Amer 28 (L) >60 mL/min    Comment: (NOTE) The eGFR has been calculated using the CKD EPI equation. This calculation has not been validated in all clinical situations. eGFR's persistently <60 mL/min signify possible Chronic Kidney Disease.    Anion gap 8 5 - 15  Glucose, capillary     Status: Abnormal   Collection Time: 06/10/15  7:23 AM  Result Value Ref Range   Glucose-Capillary 142 (H) 65 - 99 mg/dL   Comment 1 Notify RN   Glucose, capillary     Status: Abnormal   Collection Time: 06/10/15 11:47 AM  Result Value Ref Range   Glucose-Capillary 295 (H) 65 - 99 mg/dL   Comment 1 Notify RN   Glucose, capillary     Status: None   Collection Time: 06/10/15  4:21 PM  Result Value Ref Range   Glucose-Capillary 70 65 - 99 mg/dL   Comment 1 Notify RN    Comment 2 Document in Chart     Dg Chest Portable 1 View  06/08/2015   CLINICAL DATA:  Shortness of breath  EXAM: PORTABLE CHEST 1 VIEW  COMPARISON:  05/20/2015  FINDINGS: Chronic cardiopericardial enlargement. There is chronic vascular pedicle widening and aortic tortuosity with superimposed hiatal hernia. Trace left pleural  effusion. Pulmonary venous congestion and mild pulmonary edema. No air leak.  IMPRESSION: CHF pattern   Electronically Signed   By: Monte Fantasia M.D.   On: 06/08/2015 23:56    Review of Systems  Constitutional: Positive for malaise/fatigue and diaphoresis.  HENT: Positive for congestion.   Eyes: Positive for blurred vision, double vision and photophobia.  Respiratory: Positive for cough, shortness of breath and wheezing.   Cardiovascular: Positive for chest pain, orthopnea, leg swelling and PND.  Gastrointestinal: Positive for heartburn.  Genitourinary: Negative.   Musculoskeletal: Negative.   Skin: Negative.   Neurological: Positive for weakness.  Psychiatric/Behavioral: Positive for depression and memory loss. The patient is nervous/anxious.    Blood pressure 142/61, pulse 86, temperature 98.2 F (36.8 C), temperature source Oral, resp. rate 18, height 5' 4"  (1.626 m), weight 64.638 kg (142 lb 8 oz), SpO2 97 %. Physical Exam  Nursing note and vitals reviewed. Constitutional: She appears well-developed and well-nourished.  HENT:  Head: Normocephalic and atraumatic.  Eyes: Conjunctivae and EOM are normal. Pupils are equal, round, and reactive to light.  Neck: Normal range of motion. Neck supple.  Cardiovascular: S1 normal, S2 normal and normal pulses.  An irregularly irregular rhythm present. PMI is displaced.  Exam reveals gallop, S3 and S4.   Murmur heard.  Systolic murmur is present with a grade of 2/6    Assessment/Plan:  respiratory failure  Congestive Heart Failure  cardiomyopathy  demand ischemia  edema  shortness of breath  atrial fibrillation  Chronic obstructive pulmonary disease  diabetes  smoking  myasthenia gravis  hypertension  chronic renal insufficiency  bipolar . PLAN  agree with transfer to telemetry because AFIB/SOB and respiratory failure  continue diltiazem for atrial fibrillation rate control  hypertension control with diltiazem Coereg  hydralazine  inhalers for Chronic obstructive pulmonary disease shortness of breath  heart failure treatment with Lasix Coreg hydralazine  shortness of breath continue supplemental oxygen therapy  DVT prophylaxis  physical therapy because of progressive weakness  consider neurology improved for myasthenia gravis on her blurred vision  consider nursing home placement patient is not  Ambulatory at this point  continue heart failure treatment then AFib control  continue dementia therapy with Aricept   CALLWOOD,DWAYNE D. 06/10/2015, 4:46 PM

## 2015-06-10 NOTE — Progress Notes (Signed)
[place] [facility] [place] NAME: Mackenzie Rose    MR#:  [id number]  DATE OF BIRTH:  22-Dec-1945  SUBJECTIVE:  Came in with increasing sob and was place don BIPAP. Now off. Feels much better today Slept ok. Incontinent of urine so unable to measure output.  oxygen down to 3 liters  REVIEW OF SYSTEMS:   Review of Systems  Constitutional: Negative for fever, chills and weight loss.  HENT: Negative for ear discharge, ear pain and nosebleeds.   Eyes: Negative for blurred vision, pain and discharge.  Respiratory: Positive for cough and shortness of breath. Negative for sputum production, wheezing and stridor.   Cardiovascular: Negative for chest pain, palpitations, orthopnea and PND.  Gastrointestinal: Negative for nausea, vomiting, abdominal pain and diarrhea.  Genitourinary: Negative for urgency and frequency.  Musculoskeletal: Negative for back pain and joint pain.  Neurological: Positive for weakness. Negative for sensory change, speech change and focal weakness.  Psychiatric/Behavioral: Negative for depression and hallucinations. The patient is not nervous/anxious.   All other systems reviewed and are negative.  Tolerating Diet:yes Tolerating PT: eval pending  DRUG ALLERGIES:   Allergies  Allergen Reactions  . Lactose Intolerance (Gi) Other (See Comments)    Reaction:  GI upset   . Lithium Other (See Comments)    Reaction:  Unknown   . Penicillins Rash    VITALS:  Blood pressure 136/60, pulse 87, temperature 98.2 F (36.8 C), temperature source Oral, resp. rate 22, height 5\' 4"  (1.626 m), weight 64.638 kg (142 lb 8 oz), SpO2 95 %.  PHYSICAL EXAMINATION:   Physical Exam  GENERAL:  69 y.o.-year-old patient lying in the bed with no acute distress.  EYES: Pupils equal, round, reactive to light and accommodation. No scleral icterus. Extraocular muscles intact.  HEENT: Head atraumatic, normocephalic. Oropharynx and nasopharynx  clear.  NECK:  Supple, no jugular venous distention. No thyroid enlargement, no tenderness.  LUNGS: decreased breath sounds bilaterally, no wheezing, ++ rales, no rhonchi. No use of accessory muscles of respiration.  CARDIOVASCULAR: S1, S2 normal. No murmurs, rubs, or gallops.  ABDOMEN: Soft, nontender, nondistended. Bowel sounds present. No organomegaly or mass.  EXTREMITIES: No cyanosis, clubbing  ++ edema b/l.    NEUROLOGIC: Cranial nerves II through XII are intact. No focal Motor or sensory deficits b/l.   PSYCHIATRIC: The patient is alert and oriented x 3.  SKIN: No obvious rash, lesion, or ulcer.    LABORATORY PANEL:   CBC  Recent Labs Lab 06/09/15 0527  WBC 6.4  HGB 9.8*  HCT 31.0*  PLT 252    Chemistries   Recent Labs Lab 06/08/15 2310 06/09/15 0527 06/10/15 0501  NA 135 143 141  K 6.2* 4.3 3.3*  CL 102 104 100*  CO2 25 31 33*  GLUCOSE 236* 113* 149*  BUN 25* 25* 33*  CREATININE 2.15* 1.96* 1.98*  CALCIUM 8.8* 9.2 8.6*  MG  --  2.0  --   AST 26  --   --   ALT 18  --   --   ALKPHOS 114  --   --   BILITOT 0.7  --   --     Cardiac Enzymes  Recent Labs Lab 06/08/15 2310  TROPONINI 0.12*    RADIOLOGY:  Dg Chest Portable 1 View  06/08/2015   CLINICAL DATA:  Shortness of breath  EXAM: PORTABLE CHEST 1 VIEW  COMPARISON:  05/20/2015  FINDINGS: Chronic cardiopericardial enlargement. There is chronic vascular pedicle  widening and aortic tortuosity with superimposed hiatal hernia. Trace left pleural effusion. Pulmonary venous congestion and mild pulmonary edema. No air leak.  IMPRESSION: CHF pattern   Electronically Signed   By: Monte Fantasia M.D.   On: 06/08/2015 23:56     ASSESSMENT AND PLAN:  69 y.o. female who presents with shortness of breath, worsening over the last 3-4 days. Patient states that she's also had some intermittent chest pains throughout the same time. She states that she's had some episodes of acute CHF in the past, but denies being  chronically followed for CHF  1. Acute systolic CHF (congestive heart failure) - given IV Lasix in the ED, and placed on BiPAP. -sats much imporved -off BIPAP -wean oxygen -changed lasix to IV 20 mg bid yday-->lasix qd -replace K -echo shows EF 35% (was 55-60% on July 2016) -cardiology consult -pt will need oxygen at discharge -added coreg. Cont hydralazine and diltiazem -no ace due to CKD  2. Acute on Chronic RF (acute renal failure)  -diuresing cautiously this time. May need to get nephrology involved at some point given the delicate balance between her need for diuresis for CHF Baseline creat 1.15.  3. Myasthenia gravis - continue home meds for this  4.HTN (hypertension) - elevated here, continue home antihypertensives, diuresis may help with this some. Can use additional IV here antihypertensives when necessary  5.Type 2 diabetes mellitus - sliding scale   6.COPD (chronic obstructive pulmonary disease) - continue home inhalers, when necessary DuoNeb's  7.h/o chronic afib -on diltiazem. No anticoagulation due to falls  8.Bipolar affective disorder, mixed - continue home meds  9.gen weakness with deconditioning due to #1 PT eval  Management plans discussed with the patient and  in agreement.  CODE STATUS:FULL  DVT Prophylaxis: lovenox  TOTAL TIME Spent in CARE OF THIS PATIENT: 30 minutes.  >50% time spent on counselling and coordination of care with pt  POSSIBLE D/C IN 1-2 days DEPENDING ON CLINICAL CONDITION.   Mackenzie Rose M.D on 06/10/2015 at 7:54 AM  Between 7am to 6pm - Pager - [phone]  After 6pm go to [email or web address] - password [email or web address] [place]  Office  [phone]  CC: Primary care physician; Donato Schultz, MD

## 2015-06-10 NOTE — Progress Notes (Signed)
CSW in to assess patient for disposition needs, as patient was recently discharge to SNF.  After two attempts unable to complete assessment due to patient's medical needs being taken care of related to her shortness of breath.  CSW will return at appropriate time to complete assessment.  Mackenzie Rose. [name], MSW Clinical Social Work Department Emergency Room [phone] 11:56 AM

## 2015-06-10 NOTE — Progress Notes (Signed)
Patient continues to complainst of difficulty breathing and receiving breathing treatments and cpap/bipap,vital signs stable

## 2015-06-10 NOTE — Progress Notes (Signed)
Subjective:   patient states to be doing reasonably well except for blurred vision mild vertigo double vision patient states shortness of breath improve still has some leg swelling no significant chest pain symptoms. Patient states she is unable to walk may need physical therapy and is considering transferred to a nursing home instead of returning to our house.  Objective:  Vital Signs in the last 24 hours: Temp:  [98.2 F (36.8 C)-98.7 F (37.1 C)] 98.2 F (36.8 C) (10/02 0507) Pulse Rate:  [86-114] 86 (10/02 1149) Resp:  [18-22] 18 (10/02 1149) BP: (136-148)/(60-94) 142/61 mmHg (10/02 1149) SpO2:  [95 %-99 %] 97 % (10/02 1149) Weight:  [64.638 kg (142 lb 8 oz)] 64.638 kg (142 lb 8 oz) (10/02 0507)  Intake/Output from previous day: 10/01 0701 - 10/02 0700 In: 30 [P.O.:358] Out: 0  Intake/Output from this shift:    Physical Exam: General appearance: cooperative and appears older than stated age Neck: no adenopathy, no carotid bruit, no JVD, supple, symmetrical, trachea midline and thyroid not enlarged, symmetric, no tenderness/mass/nodules Lungs: clear to auscultation bilaterally Heart: irregularly irregular rhythm, S3 present and S4 present Abdomen: soft, non-tender; bowel sounds normal; no masses,  no organomegaly Extremities: edema Bilateral lower extremities 2+ Pulses: 2+ and symmetric Skin: Skin color, texture, turgor normal. No rashes or lesions Neurologic: Alert and oriented X 3, normal strength and tone. Normal symmetric reflexes. Normal coordination and gait  Lab Results:  Recent Labs  06/08/15 2310 06/09/15 0527  WBC 9.4 6.4  HGB 10.1* 9.8*  PLT 259 252    Recent Labs  06/09/15 0527 06/10/15 0501  NA 143 141  K 4.3 3.3*  CL 104 100*  CO2 31 33*  GLUCOSE 113* 149*  BUN 25* 33*  CREATININE 1.96* 1.98*    Recent Labs  06/08/15 2310  TROPONINI 0.12*   Hepatic Function Panel  Recent Labs  06/08/15 2310  PROT 6.1*  ALBUMIN 3.2*  AST 26  ALT  18  ALKPHOS 114  BILITOT 0.7   No results for input(s): CHOL in the last 72 hours. No results for input(s): PROTIME in the last 72 hours.  Imaging: Imaging results have been reviewed  Cardiac Studies:  Assessment/Plan:  Angina Atrial Fibrillation Cardiomyopathy CHF Coronary Artery Disease Edema Shortness of Breath   diabetes  hypertension  Chronic obstructive pulmonary disease  chronic renal insufficiency  smoking  bipolar  dementia  myasthenia gravis . PLAN  continued telemetry for atrial fibrillation vertigo  continue supplemental oxygen for shortness of breath  Congestive Heart Failure therapy with coreg hydralazine Lasix  inhalers for Chronic obstructive pulmonary disease  advised patient to quit smoking  atrial fibrillation controlled with diltiazem Cardizem  have the patient follow up with Nephrology for renal sufficiency  hypertension controlled diltiazem Coreg hydralazine  physical therapy console for strength training ambulation  consider Neurology for treatment and evaluation of myasthenia gravis  care management for possible discharge evaluation and placement possibly for rehab  LOS: 1 day    CALLWOOD,DWAYNE D. 06/10/2015, 4:57 PM

## 2015-06-10 NOTE — Care Management Note (Signed)
Case Management Note  Patient Details  Name: Mackenzie Rose MRN: [id number] Date of Birth: Nov 25, 1945  Subjective/Objective:  From [facility]. Pending Social Work evaluation.                   Action/Plan:   Expected Discharge Date:                  Expected Discharge Plan:     In-House Referral:     Discharge planning Services     Post Acute Care Choice:    Choice offered to:     DME Arranged:    DME Agency:     HH Arranged:    [place] Agency:     Status of Service:     Medicare Important Message Given:    Date Medicare IM Given:    Medicare IM give by:    Date Additional Medicare IM Given:    Additional Medicare Important Message give by:     If discussed at [place] of Stay Meetings, dates discussed:    Additional Comments:  Karl Knarr A, RN 06/10/2015, 4:39 PM

## 2015-06-10 NOTE — Progress Notes (Signed)
patient Alert and oriented. Sinus Tach on tele. c/o cough x2, PRN given x2, effect. No acute distress noted. Will continue to monitor patient.

## 2015-06-10 NOTE — Progress Notes (Signed)
PT Cancellation Note  Patient Details Name: Mackenzie Rose MRN: [id number] DOB: 1946/04/06   Cancelled Treatment:    Reason Eval/Treat Not Completed: Fatigue/lethargy limiting ability to participate. Pt is received on BiPAP (by her request), but is willing to go back on 4L via [place] to attempt evaluation. Initiated evaluation, but pt increasingly complaining of dyspnea and reporting that she feel like she will pass out with every attempt of mobility. RN reports she was recently started on lasix. Will see if this is helpful in relieving dyspnea later. Pt remains between 95-99% SaO2 in spite of continued SOB and dyspnea. Will attempt again at later date/time.    Shondale Quinley C 06/10/2015, 12:49 PM  12:51 PM  Etta Grandchild, PT, DPT [place] License # [id number]

## 2015-06-10 NOTE — Progress Notes (Signed)
Pt. Removed both IV's, two attempts made by two nurses to insert new IV's with no success, IV's blew. ICU nurse called to attempt. Pt tolerated each attempt well. Will continue to monitor pt.

## 2015-06-10 NOTE — Progress Notes (Signed)
Pt. Repeatedly states she can not breath vital signs are stable, she is able to speak in full sentences without difficulty. No labored breathing noted. No SOB noted. No accessory muscles in use. Incentive spirometry given and pt. Educated on use. MD aware (Dr. Earleen Newport). Respiratory therapist came to evaluate pt. explained in detail pt could not  wear both the nasal cannula and the bipap at the same time. Pt refuses to wear bipap without the nasal cannula.  Dr. Earleen Newport ordered xanax for anxiety. Xanax given. Will continue to monitor pt.

## 2015-06-10 NOTE — Progress Notes (Signed)
Called to pt's room for pt complaining of breathing difficulty.Pt on 3L [place]. O2 sat 98% RR 21. Pt has no accessory muscle use. Pt started complaining of difficulty breathing and states that she is going to pass out. I offer to place pt on Bipap. RN at bedside and offers vital sign check. All vitals are stable. Pt is placed on Bipap and immediately starts pulling at the mask saying she can't breath and needs the nasal cannula and the Bipap. I explain multiple times that both O2 devices are not necessary and that is against hospital protocol to use both. Pt is speaking in very fast and is very insistent on wearing both. Due to pt pulling at Bipap mask, I removed pt from Bipap and placed pt on 3L [place]. O2 sats remain at 98% and RR are between 20 - 25 depending on how fast she is speaking. Pt is not diaphoretic and and shows no signs of passing out or impending respiratory distress. Pt states that she may be a bit anxious. RN to call dr & notify of pt's anxiety.

## 2015-06-11 ENCOUNTER — Inpatient Hospital Stay: Payer: Medicare Other

## 2015-06-11 LAB — URINALYSIS COMPLETE WITH MICROSCOPIC (ARMC ONLY)
BACTERIA UA: NONE SEEN
Bilirubin Urine: NEGATIVE
Glucose, UA: NEGATIVE mg/dL
HGB URINE DIPSTICK: NEGATIVE
KETONES UR: NEGATIVE mg/dL
LEUKOCYTES UA: NEGATIVE
Nitrite: NEGATIVE
PH: 6 (ref 5.0–8.0)
PROTEIN: 100 mg/dL — AB
SPECIFIC GRAVITY, URINE: 1.008 (ref 1.005–1.030)
SQUAMOUS EPITHELIAL / LPF: NONE SEEN

## 2015-06-11 LAB — GLUCOSE, CAPILLARY
GLUCOSE-CAPILLARY: 131 mg/dL — AB (ref 65–99)
GLUCOSE-CAPILLARY: 163 mg/dL — AB (ref 65–99)
GLUCOSE-CAPILLARY: 173 mg/dL — AB (ref 65–99)
Glucose-Capillary: 402 mg/dL — ABNORMAL HIGH (ref 65–99)
Glucose-Capillary: 243 mg/dL — ABNORMAL HIGH (ref 65–99)

## 2015-06-11 LAB — BASIC METABOLIC PANEL
Anion gap: 7 (ref 5–15)
BUN: 32 mg/dL — ABNORMAL HIGH (ref 6–20)
CALCIUM: 8.5 mg/dL — AB (ref 8.9–10.3)
CO2: 31 mmol/L (ref 22–32)
CREATININE: 1.64 mg/dL — AB (ref 0.44–1.00)
Chloride: 101 mmol/L (ref 101–111)
GFR calc non Af Amer: 30 mL/min — ABNORMAL LOW (ref >60)
GFR, EST AFRICAN AMERICAN: 35 mL/min — AB (ref >60)
Glucose, Bld: 116 mg/dL — ABNORMAL HIGH (ref 65–99)
Potassium: 3.5 mmol/L (ref 3.5–5.1)
SODIUM: 139 mmol/L (ref 135–145)

## 2015-06-11 LAB — PROTEIN / CREATININE RATIO, URINE
CREATININE, URINE: 32 mg/dL
Protein Creatinine Ratio: 2.91 mg/mg{Cre} — ABNORMAL HIGH (ref 0.00–0.15)
Total Protein, Urine: 93 mg/dL

## 2015-06-11 LAB — LIPID PANEL
CHOL/HDL RATIO: 4 ratio
CHOLESTEROL: 183 mg/dL (ref 0–200)
HDL: 46 mg/dL (ref >40)
LDL Cholesterol: 105 mg/dL — ABNORMAL HIGH (ref 0–99)
TRIGLYCERIDES: 160 mg/dL — AB (ref <150)
VLDL: 32 mg/dL (ref 0–40)

## 2015-06-11 LAB — TRIGLYCERIDES: TRIGLYCERIDES: 86 mg/dL (ref <150)

## 2015-06-11 MED ORDER — MIDAZOLAM HCL 2 MG/2ML IJ SOLN
2.0000 mg | INTRAMUSCULAR | Status: DC | PRN
Start: 2015-06-11 — End: 2015-06-12
  Administered 2015-06-11: 2 mg via INTRAVENOUS
  Filled 2015-06-11: qty 6

## 2015-06-11 MED ORDER — FUROSEMIDE 10 MG/ML IJ SOLN: 40.0000 mg | Freq: Two times a day (BID) | INTRAMUSCULAR | Status: DC

## 2015-06-11 MED ORDER — ETOMIDATE 2 MG/ML IV SOLN
10.0000 mg | Freq: Once | INTRAVENOUS | Status: AC
  Administered 2015-06-11: 10 mg via INTRAVENOUS

## 2015-06-11 MED ORDER — FUROSEMIDE 10 MG/ML IJ SOLN
20.0000 mg | Freq: Two times a day (BID) | INTRAMUSCULAR | Status: DC
  Administered 2015-06-11: 20 mg via INTRAVENOUS
  Filled 2015-06-11: qty 2

## 2015-06-11 MED ORDER — FLUCONAZOLE 50 MG PO TABS
150.0000 mg | ORAL_TABLET | Freq: Every day | ORAL | Status: DC
  Administered 2015-06-11 – 2015-06-12 (×2): 150 mg via ORAL
  Filled 2015-06-11 (×2): qty 3

## 2015-06-11 MED ORDER — FUROSEMIDE 10 MG/ML IJ SOLN
80.0000 mg | Freq: Two times a day (BID) | INTRAMUSCULAR | Status: DC
  Administered 2015-06-11 – 2015-06-14 (×6): 80 mg via INTRAVENOUS
  Filled 2015-06-11 (×6): qty 8

## 2015-06-11 MED ORDER — FENTANYL CITRATE (PF) 100 MCG/2ML IJ SOLN
INTRAMUSCULAR | Status: AC
  Administered 2015-06-11: 50 ug via INTRAVENOUS
  Filled 2015-06-11: qty 4

## 2015-06-11 MED ORDER — FENTANYL CITRATE (PF) 100 MCG/2ML IJ SOLN
50.0000 ug | INTRAMUSCULAR | Status: DC | PRN
  Administered 2015-06-11 – 2015-06-17 (×5): 50 ug via INTRAVENOUS
  Filled 2015-06-11: qty 2

## 2015-06-11 MED ORDER — PROPOFOL 1000 MG/100ML IV EMUL
5.0000 ug/kg/min | INTRAVENOUS | Status: DC
  Administered 2015-06-11: 10 ug/kg/min via INTRAVENOUS
  Administered 2015-06-11: 20 ug/kg/min via INTRAVENOUS
  Filled 2015-06-11 (×2): qty 100

## 2015-06-11 MED ORDER — CLINDAMYCIN PHOSPHATE 300 MG/50ML IV SOLN
300.0000 mg | Freq: Three times a day (TID) | INTRAVENOUS | Status: DC
  Administered 2015-06-11 – 2015-06-13 (×5): 300 mg via INTRAVENOUS
  Filled 2015-06-11 (×8): qty 50

## 2015-06-11 MED ORDER — POTASSIUM CHLORIDE 20 MEQ PO PACK
20.0000 meq | PACK | Freq: Two times a day (BID) | ORAL | Status: DC
  Administered 2015-06-11 – 2015-06-13 (×5): 20 meq via ORAL
  Filled 2015-06-11 (×5): qty 1

## 2015-06-11 MED ORDER — SPIRONOLACTONE 25 MG PO TABS
25.0000 mg | ORAL_TABLET | Freq: Every day | ORAL | Status: DC
  Administered 2015-06-11 – 2015-06-13 (×3): 25 mg via ORAL
  Filled 2015-06-11 (×3): qty 1

## 2015-06-11 MED ORDER — INSULIN ASPART 100 UNIT/ML ~~LOC~~ SOLN
15.0000 [IU] | Freq: Once | SUBCUTANEOUS | Status: AC
  Administered 2015-06-11: 15 [IU] via SUBCUTANEOUS

## 2015-06-11 MED ORDER — NYSTATIN 100000 UNIT/GM EX POWD
Freq: Three times a day (TID) | CUTANEOUS | Status: DC
  Administered 2015-06-11 – 2015-06-25 (×40): via TOPICAL
  Administered 2015-06-25: 1 via TOPICAL
  Administered 2015-06-26 – 2015-06-27 (×4): via TOPICAL
  Filled 2015-06-11 (×3): qty 15

## 2015-06-11 NOTE — Clinical Documentation Improvement (Signed)
Cardiology Internal Medicine  Can the diagnosis of CKD be further specified?   CKD Stage I - GFR greater than or equal to 90  CKD Stage II - GFR 60-89  CKD Stage III - GFR 30-59  CKD Stage IV - GFR 15-29  CKD Stage V - GFR < 15  ESRD (End Stage Renal Disease)  Other condition  Unable to clinically determine  Supporting Information: : (risk factors, signs and symptoms, diagnostics, treatment) -- GFR  9/30 22,  10/1 25,  10/2 33,  10/3 32 -- BUN/Cr  25/2.15,  10/1 25/1.96,  10/3 32/1.64  Please exercise your independent, professional judgment when responding. A specific answer is not anticipated or expected.   Thank You, Ezekiel Ina RN [place] [phone]

## 2015-06-11 NOTE — Care Management (Signed)
CSW following for DC plan to SNF

## 2015-06-11 NOTE — Evaluation (Signed)
Physical Therapy Evaluation Patient Details Name: Mackenzie Rose MRN: [id number] DOB: 27-Jun-1946 Today's Date: 06/11/2015   History of Present Illness  Patient recently discharged 2x from Hospital and presents from SNF with COPD exacerbation and shortness of breath. Patient has a history of myasthenia gravis and multiple falls as well as A-fib, HTN, melanoma, and dementia. She lives at [place]. Upper endoscopy on 9/13 indicating hiatal hernia.   Clinical Impression  Patient presents with COPD exacerbation from recent SNF admission and states she has been having increased RLE pain in her thigh and hip over the past 24-48 hours which has caused her to not be able to use her RLE. Upon inspection significant redness and tenderness noted on the adductor region to her ITB and into her posterior hip noted. PT attempted to have patient sit at EOB with max A x1 as pain limited patient, upon sitting patient immediately requested to lay back down due to pain and dizziness. As of her last admission to [facility] roughly 2 weeks prior she was ambulating short distances with RW. RN staff alerted of unilateral redness on RLE. At this time patient shows deficits of strength and mobility and would benefit from skilled PT services to address these.     Follow Up Recommendations SNF    Equipment Recommendations       Recommendations for Other Services       Precautions / Restrictions Precautions Precautions: Fall Restrictions Weight Bearing Restrictions: No      Mobility  Bed Mobility Overal bed mobility: Needs Assistance Bed Mobility: Supine to Sit;Sit to Supine     Supine to sit: Max assist Sit to supine: Max assist   General bed mobility comments: Patient demonstrates significant RLE pain and does not assist with mobility throughout transfer secondary to pain. As of 2 weeks ago she was completing bed mobility with min A x1, this represents a significant change.   Transfers                 General transfer comment: Unable to tolerate sitting secondary to dizziness and RLE pain.   Ambulation/Gait                Stairs            Wheelchair Mobility    Modified Rankin (Stroke Patients Only)       Balance Overall balance assessment: History of Falls                             High Level Balance Comments: Unable to assess as she could not tolerate sitting due to pain and dizziness.              Pertinent Vitals/Pain Pain Assessment: Faces Faces Pain Scale: Hurts whole lot Pain Location: R thigh pain to touch in adductors, mid ITB Pain Intervention(s): Limited activity within patient's tolerance;Monitored during session (Alerted RN to rash on R thigh area)    Home Living Family/patient expects to be discharged to:: [place]:  (Has circular RW?)      Prior Function Level of Independence: Independent with assistive device(s)   Gait / Transfers Assistance Needed: No assistance  ADL's / Homemaking Assistance Needed: Assist with bathing  Comments: Patient reports she has been performing minimal ambulation or transfers over last several days.      Hand Dominance  Extremity/Trunk Assessment               Lower Extremity Assessment:  (LLE appears near baseline, RLE provided little to no active movement today secondary to pain)         Communication   Communication: No difficulties  Cognition Arousal/Alertness: Awake/alert Behavior During Therapy: WFL for tasks assessed/performed;Restless;Anxious Overall Cognitive Status: History of cognitive impairments - at baseline (History of dementia per her charts, this appears to be her baseline. Mild perseveration)                      General Comments General comments (skin integrity, edema, etc.): Rash? noted on her R thigh and buttock    Exercises        Assessment/Plan    PT Assessment Patient  needs continued PT services  PT Diagnosis Difficulty walking;Generalized weakness   PT Problem List Decreased strength;Decreased activity tolerance;Decreased balance;Decreased safety awareness;Decreased mobility;Pain  PT Treatment Interventions DME instruction;Gait training;Stair training;Functional mobility training;Therapeutic activities;Therapeutic exercise;Balance training;Neuromuscular re-education;Patient/family education;Wheelchair mobility training;Manual techniques;Modalities   PT Goals (Current goals can be found in the Care Plan section) Acute Rehab PT Goals Patient Stated Goal: To sit up again PT Goal Formulation: With patient Time For Goal Achievement: 06/25/15 Potential to Achieve Goals: Fair    Frequency Min 2X/week   Barriers to discharge        Co-evaluation               End of Session Equipment Utilized During Treatment: Gait belt Activity Tolerance: Patient limited by pain Patient left: in bed;with call bell/phone within reach;with nursing/sitter in room Nurse Communication: Mobility status (Rash on R LE)         Time: 3818-2993 PT Time Calculation (min) (ACUTE ONLY): 15 min   Charges:   PT Evaluation $Initial PT Evaluation Tier I: 1 Procedure     PT G Codes:       Kerman Passey, PT, DPT    06/11/2015, 10:58 AM

## 2015-06-11 NOTE — Progress Notes (Signed)
[facility]  ROUNDING NOTE   Subjective:   Mackenzie Rose admitted on 9/30 with acute exacerbation of congestive heart failure. Systolic EF 88-28% on new echo.  Patient currently on BiPAP with respiratory distress.  Admitted with creatinine of 2.15. This has trended down with diuresis to 1.64.  Nephrology consulted.   Objective:  Vital signs in last 24 hours:  Temp:  [97.8 F (36.6 C)-98.4 F (36.9 C)] 97.8 F (36.6 C) (10/03 1119) Pulse Rate:  [82-98] 91 (10/03 1305) Resp:  [16-25] 25 (10/03 1119) BP: (118-149)/(47-75) 118/47 mmHg (10/03 1119) SpO2:  [87 %-98 %] 95 % (10/03 1305) Weight:  [65.499 kg (144 lb 6.4 oz)] 65.499 kg (144 lb 6.4 oz) (10/03 0034)  Weight change: 0.181 kg (6.4 oz) Filed Weights   06/09/15 1035 06/10/15 0507 06/11/15 0633  Weight: 65.318 kg (144 lb) 64.638 kg (142 lb 8 oz) 65.499 kg (144 lb 6.4 oz)    Intake/Output: I/O last 3 completed shifts: In: 240 [P.O.:240] Out: 0    Intake/Output this shift:  Total I/O In: 240 [P.O.:240] Out: 0   Physical Exam: General: In respiratory distress  Head: Normocephalic, atraumatic. +NIPPV  Eyes: Anicteric, PERRL  Neck: Supple, trachea midline  Lungs:  Bilateral crackles  Heart: Regular rate and rhythm  Abdomen:  Soft, nontender  Extremities: 1+ peripheral edema.  Neurologic: Nonfocal, moving all four extremities  Skin: No lesions  GU Foley with urine    Basic Metabolic Panel:  Recent Labs Lab 06/08/15 2310 06/09/15 0527 06/10/15 0501 06/11/15 0413  NA 135 143 141 139  K 6.2* 4.3 3.3* 3.5  CL 102 104 100* 101  CO2 25 31 33* 31  GLUCOSE 236* 113* 149* 116*  BUN 25* 25* 33* 32*  CREATININE 2.15* 1.96* 1.98* 1.64*  CALCIUM 8.8* 9.2 8.6* 8.5*  MG  --  2.0  --   --   PHOS  --  4.5  --   --     Liver Function Tests:  Recent Labs Lab 06/08/15 2310  AST 26  ALT 18  ALKPHOS 114  BILITOT 0.7  PROT 6.1*  ALBUMIN 3.2*   No results for input(s): LIPASE, AMYLASE in the last 168  hours. No results for input(s): AMMONIA in the last 168 hours.  CBC:  Recent Labs Lab 06/08/15 2310 06/09/15 0527  WBC 9.4 6.4  HGB 10.1* 9.8*  HCT 33.4* 31.0*  MCV 83.0 81.1  PLT 259 252    Cardiac Enzymes:  Recent Labs Lab 06/08/15 2310  TROPONINI 0.12*    BNP: Invalid input(s): POCBNP  CBG:  Recent Labs Lab 06/10/15 1147 06/10/15 1621 06/10/15 1943 06/11/15 0730 06/11/15 1118  GLUCAP 295* 70 115* 131* 402*    Microbiology: Results for orders placed or performed during the hospital encounter of 06/08/15  MRSA PCR Screening     Status: None   Collection Time: 06/09/15  5:26 AM  Result Value Ref Range Status   MRSA by PCR NEGATIVE NEGATIVE Final    Comment:        The GeneXpert MRSA Assay (FDA approved for NASAL specimens only), is one component of a comprehensive MRSA colonization surveillance program. It is not intended to diagnose MRSA infection nor to guide or monitor treatment for MRSA infections.     Coagulation Studies: No results for input(s): LABPROT, INR in the last 72 hours.  Urinalysis: No results for input(s): COLORURINE, LABSPEC, PHURINE, GLUCOSEU, HGBUR, BILIRUBINUR, KETONESUR, PROTEINUR, UROBILINOGEN, NITRITE, LEUKOCYTESUR in the last 72 hours.  Invalid input(s): APPERANCEUR    Imaging: No results found.   Medications:     . antiseptic oral rinse  7 mL Mouth Rinse BID  . busPIRone  10 mg Oral BID  . carvedilol  3.125 mg Oral BID WC  . diltiazem  180 mg Oral Daily  . donepezil  10 mg Oral Daily  . fluconazole  150 mg Oral Daily  . furosemide  20 mg Intravenous Q12H  . heparin  5,000 Units Subcutaneous 3 times per day  . hydrALAZINE  50 mg Oral TID  . insulin aspart  0-9 Units Subcutaneous TID WC  . lamoTRIgine  100 mg Oral BID  . latanoprost  1 drop Both Eyes QHS  . mometasone-formoterol  2 puff Inhalation BID  . nicotine  7 mg Transdermal Daily  . nystatin   Topical TID  . pantoprazole  40 mg Oral BID AC  .  potassium chloride  20 mEq Oral BID  . pyridostigmine  60 mg Oral TID  . sodium chloride  3 mL Intravenous Q12H  . spironolactone  25 mg Oral Daily  . traZODone  50 mg Oral QHS   acetaminophen **OR** acetaminophen, albuterol, ALPRAZolam, guaiFENesin, ipratropium-albuterol, ondansetron **OR** ondansetron (ZOFRAN) IV  Assessment/ Plan:  Mackenzie Rose is a 69 y.o. white female with coronary artery disease, atrial fibrillation, hypertension, hyperlipidemia, diabetes mellitus type II, COPD, tremor, systolic congestive heart failure, depression, anxiety, dementia, bipolar disease who was admitted on 06/08/2015 for Hyperkalemia [E87.5] Acute renal insufficiency [N28.9] Acute congestive heart failure, unspecified congestive heart failure type ([place]) [I50.9]  1. Acute Renal Failure with hyperkalemia on chronic kidney disease stage III with proteinuria: Admitted with creatinine of 2.15, now trending downward to creatinine of 1.64. Baseline creatinine of 1.19, eGFR of 45. Acute renal failure secondary to acute cardiopulmonary syndrome exacerbated by acute exacerbation of systolic congestive heart failure.  Chronic Kidney Disease with proteinuria secondary to diabetic nephropathy and renal artery stenosis. Left kidney atrophy on CT 05/21/2015.  - Check renal ultrasound - Renally dose all medications - Will check urinalysis, spot urine protein to creatinine ratio, SPEP/UPEP, and follow urine output and renal function. No acute indication for dialysis.   2. Renal mass: right: seen on CT.  - Check renal ultrasound.   3. Acute exacerbation of systolic congestive heart failure: - increase furosemide to 57m IV q12.  - continue spironolactone    LOS: 2 Mackenzie Rose 10/3/20164:20 PM

## 2015-06-11 NOTE — Progress Notes (Signed)
Pt found struggling for breath. Resp Erica called. Bipap placed 100%. MD Weiting placed. Tx to CCU 14 to be intubated.

## 2015-06-11 NOTE — Progress Notes (Addendum)
[facility] Progress Note Patient Name: Mackenzie Rose DOB: 1946/05/13 MRN: [id number]   Date of Service  06/11/2015  HPI/Events of Note  hypotnesion after diprivan. Patient on many bp drugs  eICU Interventions  Dc diprivan Do fentprn Do versed prn     Intervention Category Intermediate Interventions: Hypotension - evaluation and management  Betheny Suchecki 06/11/2015, 9:30 PM

## 2015-06-11 NOTE — Progress Notes (Signed)
Family notified of tx

## 2015-06-11 NOTE — Progress Notes (Signed)
Patient was made an initial appointment at the [facility] on June 27, 2015 at 9:00am. Thank you

## 2015-06-11 NOTE — Progress Notes (Signed)
eLink Physician-Brief Progress Note Patient Name: Mackenzie Rose DOB: November 25, 1945 MRN: [id number]   Date of Service  06/11/2015  HPI/Events of Note  eMD camera care - systolc CHF intubated  Looks stable on camera  D/w RN - orders by hospitalist in including vent and labs and abg  eICU Interventions  x     Intervention Category Major Interventions: Respiratory failure - evaluation and management  Zamauri Nez 06/11/2015, 8:38 PM

## 2015-06-11 NOTE — Progress Notes (Signed)
Pt's BP is 88/33 on propofol, DC'd drip, called Elink for new orders.

## 2015-06-11 NOTE — Progress Notes (Signed)
Patient ID: Mackenzie Rose, female   DOB: 1945-10-12, 69 y.o.   MRN: [id number] [facility] PROGRESS NOTE  PCP: Donato Schultz, MD  HPI/Subjective: Called for a rapid response, Patient was put on 100% bipap and was not responsive. Decision was made for intubation and transfer to ICU  Objective: Filed Vitals:   06/11/15 1929  BP: 115/54  Pulse: 65  Temp:   Resp:     Filed Weights   06/09/15 1035 06/10/15 0507 06/11/15 0633  Weight: 65.318 kg (144 lb) 64.638 kg (142 lb 8 oz) 65.499 kg (144 lb 6.4 oz)    ROS: Review of Systems  Unable to perform ROS  Exam: Physical Exam  Constitutional: She appears lethargic.  HENT:  Nose: No mucosal edema.  Mouth/Throat: No oropharyngeal exudate or posterior oropharyngeal edema.  Eyes: Conjunctivae and lids are normal. Pupils are equal, round, and reactive to light.  Neck: JVD present. Carotid bruit is not present. No edema present. No thyroid mass and no thyromegaly present.  Cardiovascular: S1 normal and S2 normal.  Exam reveals no gallop.   Murmur heard.  Systolic murmur is present with a grade of 2/6  Pulses:      Dorsalis pedis pulses are 2+ on the right side, and 2+ on the left side.  Respiratory: Accessory muscle usage present. Tachypnea noted. She is in respiratory distress. She has no wheezes. She has no rhonchi. She has rales in the right middle field, the right lower field, the left middle field and the left lower field.  GI: Soft. Bowel sounds are normal. There is no tenderness.  Musculoskeletal:       Right ankle: She exhibits swelling.       Left ankle: She exhibits swelling.  Lymphadenopathy:    She has no cervical adenopathy.  Neurological: She appears lethargic.  Skin: Skin is warm. No rash noted. Nails show no clubbing.  Psychiatric:  Unable to test.     Data Reviewed: Basic Metabolic Panel:  Recent Labs Lab 06/08/15 2310 06/09/15 0527 06/10/15 0501 06/11/15 0413  NA 135 143 141 139  K 6.2* 4.3  3.3* 3.5  CL 102 104 100* 101  CO2 25 31 33* 31  GLUCOSE 236* 113* 149* 116*  BUN 25* 25* 33* 32*  CREATININE 2.15* 1.96* 1.98* 1.64*  CALCIUM 8.8* 9.2 8.6* 8.5*  MG  --  2.0  --   --   PHOS  --  4.5  --   --    Liver Function Tests:  Recent Labs Lab 06/08/15 2310  AST 26  ALT 18  ALKPHOS 114  BILITOT 0.7  PROT 6.1*  ALBUMIN 3.2*    CBC:  Recent Labs Lab 06/08/15 2310 06/09/15 0527  WBC 9.4 6.4  HGB 10.1* 9.8*  HCT 33.4* 31.0*  MCV 83.0 81.1  PLT 259 252    CBG:  Recent Labs Lab 06/11/15 0730 06/11/15 1118 06/11/15 1639 06/11/15 1652 06/11/15 1928  GLUCAP 131* 402* 163* 243* 173*    Recent Results (from the past 240 hour(s))  MRSA PCR Screening     Status: None   Collection Time: 06/09/15  5:26 AM  Result Value Ref Range Status   MRSA by PCR NEGATIVE NEGATIVE Final    Comment:        The GeneXpert MRSA Assay (FDA approved for NASAL specimens only), is one component of a comprehensive MRSA colonization surveillance program. It is not intended to diagnose MRSA infection nor to guide or monitor treatment for  MRSA infections.      Scheduled Meds: . antiseptic oral rinse  7 mL Mouth Rinse BID  . busPIRone  10 mg Oral BID  . carvedilol  3.125 mg Oral BID WC  . diltiazem  180 mg Oral Daily  . donepezil  10 mg Oral Daily  . fluconazole  150 mg Oral Daily  . furosemide  80 mg Intravenous Q12H  . heparin  5,000 Units Subcutaneous 3 times per day  . hydrALAZINE  50 mg Oral TID  . insulin aspart  0-9 Units Subcutaneous TID WC  . lamoTRIgine  100 mg Oral BID  . latanoprost  1 drop Both Eyes QHS  . mometasone-formoterol  2 puff Inhalation BID  . nicotine  7 mg Transdermal Daily  . nystatin   Topical TID  . pantoprazole  40 mg Oral BID AC  . potassium chloride  20 mEq Oral BID  . pyridostigmine  60 mg Oral TID  . sodium chloride  3 mL Intravenous Q12H  . spironolactone  25 mg Oral Daily  . traZODone  50 mg Oral QHS   Continuous Infusions: .  propofol (DIPRIVAN) infusion      Assessment/Plan:  1. Acute respiratory failure, acute encephalopathy, failed BiPAP trial. I proceeded to transfer the patient to the ICU for intubation. 10 mg of etomidate was given prior to intubation. I will increase Lasix 80 mg twice a day. Get an ABG 1 hour. Chest x-ray and NG tube. Patient may have aspirated because stomach contents did come out with the NG tube. And Zosyn just in case aspiration 2. Acute systolic congestive heart failure- increase Lasix up to 80 mg IV twice a day  Code Status:     Code Status Orders        Start     Ordered   06/09/15 0400  Full code   Continuous     06/09/15 0359    Advance Directive Documentation        Most Recent Value   Type of Advance Directive  Healthcare Power of Attorney   Pre-existing out of facility DNR order (yellow form or pink MOST form)     "MOST" Form in Place?       Family Communication: Left message with son Disposition Plan: To be determined  Time spent: 35 minutes critical care time  Loletha Grayer  [facility]

## 2015-06-11 NOTE — Progress Notes (Signed)
Patient ID: Mackenzie Rose, female   DOB: Jun 13, 1946, 69 y.o.   MRN: [id number]  Spoke with patient's son on the phone and gave him an update on his mother's condition. I answered all questions he had.  Dr. Loletha Grayer

## 2015-06-11 NOTE — Progress Notes (Signed)
[facility] NAME: Mackenzie Rose    MR#:  [id number]  DATE OF BIRTH:  [birth year]  SUBJECTIVE:  C/o right hip pain. Noticed to have groin and right thigh fungal rash. Pt now back on BIPAP due to sob. Ate some BF earleir. No cp    Review of Systems  Constitutional: Negative for fever, chills and weight loss.  HENT: Negative for ear discharge, ear pain and nosebleeds.   Eyes: Negative for blurred vision, pain and discharge.  Respiratory: Positive for cough and shortness of breath. Negative for sputum production, wheezing and stridor.   Cardiovascular: Negative for chest pain, palpitations, orthopnea and PND.  Gastrointestinal: Negative for nausea, vomiting, abdominal pain and diarrhea.  Genitourinary: Negative for urgency and frequency.  Musculoskeletal: Positive for joint pain. Negative for back pain.  Skin: Positive for rash.  Neurological: Positive for weakness. Negative for sensory change, speech change and focal weakness.  Psychiatric/Behavioral: Negative for depression and hallucinations. The patient is not nervous/anxious.   All other systems reviewed and are negative.  Tolerating Diet:yes Tolerating PT: eval pending  DRUG ALLERGIES:   Allergies  Allergen Reactions  . Lactose Intolerance (Gi) Other (See Comments)    Reaction:  GI upset   . Lithium Other (See Comments)    Reaction:  Unknown   . Penicillins Rash    VITALS:  Blood pressure 76/48, pulse 57, temperature 92.6 F (33.7 C), temperature source Axillary, resp. rate 18, height 5\' 4"  (1.626 m), weight 65.499 kg (144 lb 6.4 oz), SpO2 98 %.  PHYSICAL EXAMINATION:   Physical Exam  GENERAL:  68 y.o.-year-old patient lying in the bed with no acute distress.  EYES: Pupils equal, round, reactive to light and accommodation. No scleral icterus. Extraocular muscles intact.  HEENT: Head atraumatic, normocephalic. Oropharynx and nasopharynx clear. BIPAI NECK:   Supple, no jugular venous distention. No thyroid enlargement, no tenderness.  LUNGS: decreased breath sounds bilaterally, no wheezing, ++ rales, no rhonchi. No use of accessory muscles of respiration.  CARDIOVASCULAR: S1, S2 normal. No murmurs, rubs, or gallops.  ABDOMEN: Soft, nontender, nondistended. Bowel sounds present. No organomegaly or mass.  EXTREMITIES: No cyanosis, clubbing  ++ edema b/l.    NEUROLOGIC: Cranial nerves II through XII are intact. No focal Motor or sensory deficits b/l.   PSYCHIATRIC: The patient is alert and oriented x 3.  SKIN: right groin/thigh fungal rash lesion.    LABORATORY PANEL:   CBC  Recent Labs Lab 06/09/15 0527  WBC 6.4  HGB 9.8*  HCT 31.0*  PLT 252    Chemistries   Recent Labs Lab 06/08/15 2310 06/09/15 0527  06/11/15 0413  NA 135 143  < > 139  K 6.2* 4.3  < > 3.5  CL 102 104  < > 101  CO2 25 31  < > 31  GLUCOSE 236* 113*  < > 116*  BUN 25* 25*  < > 32*  CREATININE 2.15* 1.96*  < > 1.64*  CALCIUM 8.8* 9.2  < > 8.5*  MG  --  2.0  --   --   AST 26  --   --   --   ALT 18  --   --   --   ALKPHOS 114  --   --   --   BILITOT 0.7  --   --   --   < > = values in this interval not displayed.  Cardiac Enzymes  Recent Labs Lab  06/08/15 2310  TROPONINI 0.12*    RADIOLOGY:  Dg Chest Port 1 View  06/11/2015   CLINICAL DATA:  ICU patient. History of melanoma, diabetes and hypertension. Myasthenia gravis.  EXAM: PORTABLE CHEST 1 VIEW  COMPARISON:  06/08/2015 and 05/20/2015.  FINDINGS: 2009 hours. Endotracheal tube tip is in the midtrachea. A nasogastric tube projects below the diaphragm, tip not visualized. There are worsening bibasilar airspace opacities with probable bilateral pleural effusions, worrisome for aspiration pneumonia. The pulmonary vasculature remain somewhat indistinct, although improved from 3 days ago. There is no pneumothorax. The bones appear unchanged.  IMPRESSION: 1. The endotracheal and nasogastric tubes appear in  satisfactory position. 2. Worsening bibasilar airspace opacities worrisome for aspiration pneumonia   Electronically Signed   By: Mackenzie Rose M.D.   On: 06/11/2015 20:35     ASSESSMENT AND PLAN:  69 y.o. female who presents with shortness of breath, worsening over the last 3-4 days. Patient states that she's also had some intermittent chest pains throughout the same time. She states that she's had some episodes of acute CHF in the past, but denies being chronically followed for CHF  1. Acute systolic CHF (congestive heart failure) - given IV Lasix in the ED, and placed on BiPAP. -sats much imporved -now back on BIPAP -wean oxygen -changed lasix to IV 20 mg bid yday-->lasix qdagain. Seems to be in heart failur with bilateral crackles -replace K -echo shows EF 35% (was 55-60% on July 2016) -cardiology consult with dr Clayborn Bigness noted -added coreg. Cont hydralazine and diltiazem -no ace due to CKD stage III -creat 1.64  2. Acute on Chronic RF (acute renal failure)  -diuresing cautiously this time. -need to get nephrology involved at some point given the delicate balance between her need for diuresis for CHF Baseline creat 1.15.  3. Myasthenia gravis - continue home meds for this  4.HTN (hypertension) - elevated here, continue home antihypertensives, diuresis may help with this some. Can use additional IV here antihypertensives when necessary  5.Type 2 diabetes mellitus - sliding scale   6.COPD (chronic obstructive pulmonary disease) - continue home inhalers, when necessary DuoNeb's  7.h/o chronic afib -on diltiazem. No anticoagulation due to falls  8.Bipolar affective disorder, mixed - continue home meds  9.gen weakness with deconditioning due to #1 PT eval  10. Right groin skin rash appears candidial Nystatin powder andpo fluconazole. -does not have celluliitis  Management plans discussed with the patient and  in agreement.  CODE STATUS:FULL  DVT Prophylaxis:  lovenox  TOTAL TIME Spent in CARE OF THIS PATIENT: 30 minutes.  >50% time spent on counselling and coordination of care with pt  POSSIBLE D/C IN 1-2 days DEPENDING ON CLINICAL CONDITION.   Mackenzie Rose M.D on 06/11/2015 at 9:03 AM  Between 7am to 6pm - Pager - [phone]  After 6pm go to [email or web address] - password [email or web address]  Office  [phone]  CC: Primary care physician; Donato Schultz, MD

## 2015-06-12 ENCOUNTER — Inpatient Hospital Stay: Payer: Medicare Other

## 2015-06-12 LAB — PROTEIN ELECTRO, RANDOM URINE
ALPHA-1-GLOBULIN, U: 11.4 %
ALPHA-2-GLOBULIN, U: 8.2 %
Albumin ELP, Urine: 66 %
BETA GLOBULIN, U: 11.4 %
Gamma Globulin, U: 3 %
TOTAL PROTEIN, URINE-UPE24: 73.4 mg/dL

## 2015-06-12 LAB — PROTEIN ELECTROPHORESIS, SERUM
A/G RATIO SPE: 1 (ref 0.7–1.7)
ALBUMIN ELP: 2.4 g/dL — AB (ref 2.9–4.4)
ALPHA-1-GLOBULIN: 0.5 g/dL — AB (ref 0.0–0.4)
Alpha-2-Globulin: 0.9 g/dL (ref 0.4–1.0)
BETA GLOBULIN: 0.8 g/dL (ref 0.7–1.3)
GAMMA GLOBULIN: 0.2 g/dL — AB (ref 0.4–1.8)
Globulin, Total: 2.4 g/dL (ref 2.2–3.9)
Total Protein ELP: 4.8 g/dL — ABNORMAL LOW (ref 6.0–8.5)

## 2015-06-12 LAB — BLOOD GAS, ARTERIAL
ACID-BASE EXCESS: 8 mmol/L — AB (ref 0.0–3.0)
ACID-BASE EXCESS: 10 mmol/L — AB (ref 0.0–3.0)
Allens test (pass/fail): POSITIVE — AB
Allens test (pass/fail): POSITIVE — AB
BICARBONATE: 34.7 meq/L — AB (ref 21.0–28.0)
BICARBONATE: 33.3 meq/L — AB (ref 21.0–28.0)
FIO2: 0.75
FIO2: 0.35
LHR: 15 {breaths}/min
O2 SAT: 94.5 %
O2 SAT: 92.3 %
PATIENT TEMPERATURE: 37
PEEP/CPAP: 5 cmH2O
PEEP: 5 cmH2O
PH ART: 7.4 (ref 7.350–7.450)
PO2 ART: 56 mmHg — AB (ref 83.0–108.0)
PRESSURE SUPPORT: 5 cmH2O
Patient temperature: 37
VT: 500 mL
pCO2 arterial: 56 mmHg — ABNORMAL HIGH (ref 32.0–48.0)
pCO2 arterial: 39 mmHg (ref 32.0–48.0)
pH, Arterial: 7.54 — ABNORMAL HIGH (ref 7.350–7.450)
pO2, Arterial: 73 mmHg — ABNORMAL LOW (ref 83.0–108.0)

## 2015-06-12 LAB — RENAL FUNCTION PANEL
Albumin: 2.2 g/dL — ABNORMAL LOW (ref 3.5–5.0)
Anion gap: 9 (ref 5–15)
BUN: 36 mg/dL — ABNORMAL HIGH (ref 6–20)
CHLORIDE: 104 mmol/L (ref 101–111)
CO2: 31 mmol/L (ref 22–32)
Calcium: 8.6 mg/dL — ABNORMAL LOW (ref 8.9–10.3)
Creatinine, Ser: 1.69 mg/dL — ABNORMAL HIGH (ref 0.44–1.00)
GFR, EST AFRICAN AMERICAN: 34 mL/min — AB (ref >60)
GFR, EST NON AFRICAN AMERICAN: 29 mL/min — AB (ref >60)
Glucose, Bld: 88 mg/dL (ref 65–99)
POTASSIUM: 3.8 mmol/L (ref 3.5–5.1)
Phosphorus: 2.8 mg/dL (ref 2.5–4.6)
Sodium: 144 mmol/L (ref 135–145)

## 2015-06-12 LAB — GLUCOSE, CAPILLARY
GLUCOSE-CAPILLARY: 138 mg/dL — AB (ref 65–99)
Glucose-Capillary: 162 mg/dL — ABNORMAL HIGH (ref 65–99)
Glucose-Capillary: 141 mg/dL — ABNORMAL HIGH (ref 65–99)
Glucose-Capillary: 121 mg/dL — ABNORMAL HIGH (ref 65–99)
Glucose-Capillary: 90 mg/dL (ref 65–99)

## 2015-06-12 MED ORDER — FENTANYL CITRATE (PF) 100 MCG/2ML IJ SOLN: 50.0000 ug | Freq: Once | INTRAMUSCULAR | Status: DC

## 2015-06-12 MED ORDER — HEPARIN SODIUM (PORCINE) 5000 UNIT/ML IJ SOLN
5000.0000 [IU] | Freq: Two times a day (BID) | INTRAMUSCULAR | Status: DC
  Administered 2015-06-12 – 2015-06-16 (×7): 5000 [IU] via SUBCUTANEOUS
  Filled 2015-06-12 (×7): qty 1

## 2015-06-12 MED ORDER — FENTANYL BOLUS VIA INFUSION
25.0000 ug | INTRAVENOUS | Status: DC | PRN
  Filled 2015-06-12: qty 25

## 2015-06-12 MED ORDER — FENTANYL 2500MCG IN NS 250ML (10MCG/ML) PREMIX INFUSION
25.0000 ug/h | INTRAVENOUS | Status: DC
  Administered 2015-06-12: 25 ug/h via INTRAVENOUS

## 2015-06-12 MED ORDER — ANTISEPTIC ORAL RINSE SOLUTION (CORINZ)
7.0000 mL | Freq: Four times a day (QID) | OROMUCOSAL | Status: DC
  Administered 2015-06-12 – 2015-06-20 (×32): 7 mL via OROMUCOSAL
  Filled 2015-06-12 (×45): qty 7

## 2015-06-12 MED ORDER — MIDAZOLAM HCL 2 MG/2ML IJ SOLN
1.0000 mg | INTRAMUSCULAR | Status: DC | PRN
  Administered 2015-06-12: 1 mg via INTRAVENOUS

## 2015-06-12 MED ORDER — CHLORHEXIDINE GLUCONATE 0.12% ORAL RINSE (MEDLINE KIT)
15.0000 mL | Freq: Two times a day (BID) | OROMUCOSAL | Status: DC
  Administered 2015-06-12 – 2015-06-21 (×17): 15 mL via OROMUCOSAL
  Filled 2015-06-12 (×24): qty 15

## 2015-06-12 MED ORDER — FENTANYL 2500MCG IN NS 250ML (10MCG/ML) PREMIX INFUSION
INTRAVENOUS | Status: AC
  Administered 2015-06-12: 25 ug/h via INTRAVENOUS
  Filled 2015-06-12: qty 250

## 2015-06-12 MED ORDER — MIDAZOLAM HCL 2 MG/2ML IJ SOLN
1.0000 mg | INTRAMUSCULAR | Status: DC | PRN
  Administered 2015-06-12: 1 mg via INTRAVENOUS
  Filled 2015-06-12: qty 2

## 2015-06-12 NOTE — Care Management Note (Signed)
Case Management Note  Patient Details  Name: Mackenzie Rose MRN: [id number] Date of Birth: May 26, 1946  Subjective/Objective:  Admitted with acute on chronic CHF. Developed respiratory distress, transferred to the ICU and intubated. Patient may be appropriate for LTACH/SELECT. Needs an additional 2 night stay in ICU. Select checking medicare days. Will discuss with team in the am.                   Action/Plan:   Expected Discharge Date:                  Expected Discharge Plan:     In-House Referral:  Clinical Social Work  Discharge planning Services  CM Consult  Post Acute Care Choice:    Choice offered to:     DME Arranged:    DME Agency:     HH Arranged:    [place] Agency:     Status of Service:  In process, will continue to follow  Medicare Important Message Given:  Yes-second notification given Date Medicare IM Given:    Medicare IM give by:    Date Additional Medicare IM Given:    Additional Medicare Important Message give by:     If discussed at [place] of Stay Meetings, dates discussed:    Additional Comments:  Jolly Mango, RN 06/12/2015, 1:40 PM

## 2015-06-12 NOTE — Progress Notes (Signed)
[facility]  ROUNDING NOTE   Subjective:   Patient intubated yesterday for acute respiratory distress. Moved to CCU.  UOP recorded at 400. Foley placed.  Furosemide 15m IV q12.   Objective:  Vital signs in last 24 hours:  Temp:  [92.6 F (33.7 C)-97.8 F (36.6 C)] 92.6 F (33.7 C) (10/03 1900) Pulse Rate:  [56-97] 83 (10/04 0500) Resp:  [16-28] 19 (10/04 0500) BP: (69-147)/(32-85) 96/60 mmHg (10/04 0500) SpO2:  [87 %-100 %] 95 % (10/04 0855) FiO2 (%):  [35 %-75 %] 35 % (10/04 0855)  Weight change:  Filed Weights   06/09/15 1035 06/10/15 0507 06/11/15 0633  Weight: 65.318 kg (144 lb) 64.638 kg (142 lb 8 oz) 65.499 kg (144 lb 6.4 oz)    Intake/Output: I/O last 3 completed shifts: In: 274.9 [P.O.:240; I.V.:4.9; NG/GT:30] Out: 400 [Urine:400]   Intake/Output this shift:     Physical Exam: General: Intubated, sedated, critically ill  Head: Normocephalic, atraumatic. +ETT  Eyes: Eyes closed  Neck: trachea midline  Lungs:  Clear anteriorly, mechanical ventilation: PRVC 35%   Heart: Regular rate and rhythm  Abdomen:  Soft, nontender, +abdominal wall edema  Extremities: 1+ peripheral edema.  Neurologic: sedated  Skin: Right inguinal erythema  GU Foley with urine    Basic Metabolic Panel:  Recent Labs Lab 06/08/15 2310 06/09/15 0527 06/10/15 0501 06/11/15 0413 06/12/15 0522  NA 135 143 141 139 144  K 6.2* 4.3 3.3* 3.5 3.8  CL 102 104 100* 101 104  CO2 25 31 33* 31 31  GLUCOSE 236* 113* 149* 116* 88  BUN 25* 25* 33* 32* 36*  CREATININE 2.15* 1.96* 1.98* 1.64* 1.69*  CALCIUM 8.8* 9.2 8.6* 8.5* 8.6*  MG  --  2.0  --   --   --   PHOS  --  4.5  --   --  2.8    Liver Function Tests:  Recent Labs Lab 06/08/15 2310 06/12/15 0522  AST 26  --   ALT 18  --   ALKPHOS 114  --   BILITOT 0.7  --   PROT 6.1*  --   ALBUMIN 3.2* 2.2*   No results for input(s): LIPASE, AMYLASE in the last 168 hours. No results for input(s): AMMONIA in the last 168  hours.  CBC:  Recent Labs Lab 06/08/15 2310 06/09/15 0527  WBC 9.4 6.4  HGB 10.1* 9.8*  HCT 33.4* 31.0*  MCV 83.0 81.1  PLT 259 252    Cardiac Enzymes:  Recent Labs Lab 06/08/15 2310  TROPONINI 0.12*    BNP: Invalid input(s): POCBNP  CBG:  Recent Labs Lab 06/11/15 1639 06/11/15 1652 06/11/15 1928 06/11/15 2004 06/12/15 0027  GLUCAP 163* 243* 173* 141* 138*    Microbiology: Results for orders placed or performed during the hospital encounter of 06/08/15  MRSA PCR Screening     Status: None   Collection Time: 06/09/15  5:26 AM  Result Value Ref Range Status   MRSA by PCR NEGATIVE NEGATIVE Final    Comment:        The GeneXpert MRSA Assay (FDA approved for NASAL specimens only), is one component of a comprehensive MRSA colonization surveillance program. It is not intended to diagnose MRSA infection nor to guide or monitor treatment for MRSA infections.     Coagulation Studies: No results for input(s): LABPROT, INR in the last 72 hours.  Urinalysis:  Recent Labs  06/11/15 1652  COLORURINE STRAW*  LABSPEC 1.008  PHURINE 6.0  GLUCOSEU NEGATIVE  HGBUR NEGATIVE  BILIRUBINUR NEGATIVE  KETONESUR NEGATIVE  PROTEINUR 100*  NITRITE NEGATIVE  LEUKOCYTESUR NEGATIVE      Imaging: Dg Chest Port 1 View  06/11/2015   CLINICAL DATA:  ICU patient. History of melanoma, diabetes and hypertension. Myasthenia gravis.  EXAM: PORTABLE CHEST 1 VIEW  COMPARISON:  06/08/2015 and 05/20/2015.  FINDINGS: 2009 hours. Endotracheal tube tip is in the midtrachea. A nasogastric tube projects below the diaphragm, tip not visualized. There are worsening bibasilar airspace opacities with probable bilateral pleural effusions, worrisome for aspiration pneumonia. The pulmonary vasculature remain somewhat indistinct, although improved from 3 days ago. There is no pneumothorax. The bones appear unchanged.  IMPRESSION: 1. The endotracheal and nasogastric tubes appear in satisfactory  position. 2. Worsening bibasilar airspace opacities worrisome for aspiration pneumonia   Electronically Signed   By: Richardean Sale M.D.   On: 06/11/2015 20:35     Medications:   . fentaNYL infusion INTRAVENOUS 25 mcg/hr (06/12/15 0103)   . antiseptic oral rinse  7 mL Mouth Rinse QID  . busPIRone  10 mg Oral BID  . carvedilol  3.125 mg Oral BID WC  . chlorhexidine gluconate  15 mL Mouth Rinse BID  . clindamycin (CLEOCIN) IV  300 mg Intravenous 3 times per day  . diltiazem  180 mg Oral Daily  . donepezil  10 mg Oral Daily  . fentaNYL (SUBLIMAZE) injection  50 mcg Intravenous Once  . fluconazole  150 mg Oral Daily  . furosemide  80 mg Intravenous Q12H  . heparin  5,000 Units Subcutaneous 3 times per day  . hydrALAZINE  50 mg Oral TID  . insulin aspart  0-9 Units Subcutaneous TID WC  . lamoTRIgine  100 mg Oral BID  . latanoprost  1 drop Both Eyes QHS  . mometasone-formoterol  2 puff Inhalation BID  . nicotine  7 mg Transdermal Daily  . nystatin   Topical TID  . pantoprazole  40 mg Oral BID AC  . potassium chloride  20 mEq Oral BID  . pyridostigmine  60 mg Oral TID  . sodium chloride  3 mL Intravenous Q12H  . spironolactone  25 mg Oral Daily  . traZODone  50 mg Oral QHS   albuterol, ALPRAZolam, fentaNYL, fentaNYL (SUBLIMAZE) injection, guaiFENesin, ipratropium-albuterol, midazolam, midazolam, ondansetron **OR** ondansetron (ZOFRAN) IV  Assessment/ Plan:  [name] is a 69 y.o. white female with coronary artery disease, atrial fibrillation, hypertension, hyperlipidemia, diabetes mellitus type II, COPD, tremor, systolic congestive heart failure, depression, anxiety, dementia, bipolar disease who was admitted on 06/08/2015 for Hyperkalemia [E87.5] Acute renal insufficiency [N28.9] Acute congestive heart failure, unspecified congestive heart failure type ([place]) [I50.9]  1. Acute Renal Failure with hyperkalemia on chronic kidney disease stage III with proteinuria: Admitted with  creatinine of 2.15, now trending downward to creatinine of 1.69. Baseline creatinine of 1.19, eGFR of 45. Acute renal failure secondary to acute cardiopulmonary syndrome exacerbated by acute exacerbation of systolic congestive heart failure.  Chronic Kidney Disease with proteinuria secondary to diabetic nephropathy and renal artery stenosis. Left kidney atrophy on CT 05/21/2015.  - Pending renal ultrasound - Renally dose all medications - No acute indication for dialysis. Monitor volume status, electrolytes, urine output and renal function .   2. Renal mass: right: seen on CT.  - Checking renal ultrasound.   3. Acute exacerbation of systolic congestive heart failure: - increased furosemide to 6m IV q12.  - continue spironolactone  4. Hypertension: hypotensive this morning.  - carvedilol, diltiazem, hydralazine -  may need to hold.    LOS: 3 KOLLURU, SARATH 10/4/20169:33 AM

## 2015-06-12 NOTE — Progress Notes (Signed)
Patient received from 2A 245 unresponsive on bipap. Order from Dr. Leslye Peer to intubate per respiratory. Dr. Leslye Peer at bedside. 10mg  Etomidate given prior to intubation. Intubation successful with no complications. VSS. At the moment. [facility] notified of transfer and orders for vent given. OG placed per Dr. Leslye Peer. See charting for assessments.

## 2015-06-12 NOTE — Consult Note (Addendum)
[facility] Consultation     ASSESSMENT/PLAN    PULMONARY  A: Acute hypoxic respiratory failure with pulmonary edema. Currently vent dependent. -Chronic hypercapnic respiratory failure with hypoventilation. -COPD  P:   We'll attempt to wean the patient's FiO2 down today. She is currently on an FiO2 50% take. We can wean this down to 45-40%. We could potentially attempt a weaning trial this morning. The patient's mental status this morning appears to be adequate for weaning trial. -He started [facility] once patient is extubated.  CARDIOVASCULAR  A: Acute systolic congestive heart failure. Atrial fibrillation. P:  Continue diuresis per cardiology and nephrology recommendations.  RENAL A:  Acute kidney injury P:   Nephrology following  GASTROINTESTINAL A: History of GI bleed, on recent admission. P:   Continue the patient on Protonix. Monitor hemoglobins periodically. -. Recent GI bleed. Will decrease the patient's subcutaneous heparin from every 8 hours to every 12 hours.   HEMATOLOGIC A:  Chronic anemia P:    INFECTIOUS -  ENDOCRINE Blood sugars adequately controlled at this time.  NEUROLOGIC A:  Myasthenia Gravis. History of bipolar disorder P:   Patient was on prednisone at home. We'll start the patient on IV steroids here.  MAJOR EVENTS/TEST RESULTS: -Intubated 06/11/2015  INDWELLING DEVICES:: Urinary catheter placed 06/11/2015  MICRO DATA: MRSA PCR  Urine  Blood Resp      I reviewed the chest x-ray images and report from October 3, which showed bibasilar atelectasis and bilateral pleural effusions with pulmonary edema.    ---------------------------------------  ---------------------------------------   Name: Mackenzie Rose MRN: [id number] DOB: Dec 03, 1945    ADMISSION DATE:  06/08/2015 CONSULTATION DATE: 06/12/2015  REFERRING MD :  Dr. Earleen Newport.   CHIEF COMPLAINT:  Dyspnea   HISTORY OF PRESENT ILLNESS:     The patient is currently intubated on ventilator. Therefore, cannot provide a history. All history is obtained from the chart and from staff. The patient is a 69 year old female with a history significant for COPD, smoking, CHF, myasthenia gravis, diabetes mellitus, chronic kidney disease, bipolar disorder, atrial fibrillation. The patient was admitted to the hospital back in 05/17/2015 subsequently discharge September 11 with diagnosis of vasovagal syncope. Subsequently readmitted this same day for GI bleeding. The patient underwent an upper GI endoscopy where she was found to have ulcers and a hiatal hernia. She was transfused and started on Protonix, then discharged on September 14. The patient was readmitted on 06/09/2015 for acute respiratory failure with bilateral crackles, suspected to be secondary to congestive heart failure. On October 3, the patient developed progressive respiratory failure and hypoxia and was placed on BiPAP. She failed BiPAP due to progressive respiratory failure continued hypoxia with reduced mental status and therefore was transferred to the ICU and intubated.  Currently the patient is intubated. She is on a ventilator. She is sleepy but arousable and follows some commands. She can nod yes and no to some questions. She appears to be breathing comfortably on the ventilator.  PAST MEDICAL HISTORY :  Past Medical History  Diagnosis Date  . Heart disease   . Atrial fibrillation ([place])     a. reported a-fib; b. unknown chronicity; c. dates back to 1970s; d. not on long term anticoagulation 2/2 no documented a-fib  . Hypertension   . High cholesterol   . Diabetes ([place])     borderline  . COPD (chronic obstructive pulmonary disease) ([place])   . Melanoma ([place])   . Kidney disorder   . Tremor   .  Autoimmune disease ([place])   . Depressed   . Anxiety disorder   . Migraine   . Dementia   . Motion sickness   . Myasthenia gravis ([place])   . Bipolar disorder ([place])   . Dementia    . Acute renal failure ([place])   . Near syncope    Past Surgical History  Procedure Laterality Date  . Tubal ligation    . Retena repair    . Cataract extraction    . Cholecystectomy    . Esophagogastroduodenoscopy (egd) with propofol N/A 05/22/2015    Procedure: ESOPHAGOGASTRODUODENOSCOPY (EGD) WITH PROPOFOL;  Surgeon: Lucilla Lame, MD;  Location: [facility] ENDOSCOPY;  Service: Endoscopy;  Laterality: N/A;   Prior to Admission medications   Medication Sig Start Date End Date Taking? Authorizing Provider  acetaminophen (TYLENOL) 650 MG CR tablet Take 650 mg by mouth daily. Pt takes daily at 2pm and every four hours as needed for pain.   Yes Historical Provider, MD  albuterol (PROVENTIL HFA;VENTOLIN HFA) 108 (90 BASE) MCG/ACT inhaler Inhale 2 puffs into the lungs every 4 (four) hours as needed for wheezing or shortness of breath. 03/25/15  Yes Srikar Sudini, MD  bismuth subsalicylate (PEPTO BISMOL) 262 MG/15ML suspension Take 5 mLs by mouth 4 (four) times daily as needed for diarrhea or loose stools (or nausea).   Yes Historical Provider, MD  busPIRone (BUSPAR) 10 MG tablet Take 10 mg by mouth 2 (two) times daily.  04/23/15  Yes Historical Provider, MD  Dextromethorphan-Benzocaine (SORE THROAT & COUGH LOZENGES MT) Use as directed 1 lozenge in the mouth or throat 4 (four) times daily as needed (for cough).   Yes Historical Provider, MD  diclofenac sodium (VOLTAREN) 1 % GEL Apply 2 g topically 2 (two) times daily.    Yes Historical Provider, MD  diltiazem (CARDIZEM CD) 180 MG 24 hr capsule Take 180 mg by mouth daily.    Yes Historical Provider, MD  donepezil (ARICEPT) 10 MG tablet Take 1 tablet (10 mg total) by mouth daily. 03/29/15  Yes Rainey Pines, MD  doxycycline (VIBRAMYCIN) 50 MG capsule Take 1 capsule by mouth 2 (two) times daily. 05/24/15  Yes Historical Provider, MD  ferrous sulfate 325 (65 FE) MG tablet Take 1 tablet (325 mg total) by mouth 2 (two) times daily with a meal. 05/23/15  Yes Gladstone Lighter, MD  Fluticasone-Salmeterol (ADVAIR) 250-50 MCG/DOSE AEPB Inhale 1 puff into the lungs 2 (two) times daily.   Yes Historical Provider, MD  glimepiride (AMARYL) 1 MG tablet Take 1 tablet by mouth 2 (two) times daily. 05/24/15  Yes Historical Provider, MD  guaifenesin (ROBITUSSIN) 100 MG/5ML syrup Take 100 mg by mouth 2 (two) times daily as needed for cough.   Yes Historical Provider, MD  hydrALAZINE (APRESOLINE) 50 MG tablet Take 1 tablet (50 mg total) by mouth 3 (three) times daily. 05/23/15  Yes Gladstone Lighter, MD  lamoTRIgine (LAMICTAL) 100 MG tablet Take 100 mg by mouth 2 (two) times daily.   Yes Historical Provider, MD  latanoprost (XALATAN) 0.005 % ophthalmic solution Place 1 drop into both eyes at bedtime.    Yes Historical Provider, MD  nicotine (NICODERM CQ - DOSED IN MG/24 HR) 7 mg/24hr patch Place 1 patch (7 mg total) onto the skin daily. 03/25/15  Yes Srikar Sudini, MD  ondansetron (ZOFRAN) 4 MG tablet Take 1 tablet (4 mg total) by mouth every 8 (eight) hours as needed for nausea or vomiting. 05/18/15  Yes Fritzi Mandes, MD  pantoprazole (PROTONIX) 40  MG tablet Take 1 tablet (40 mg total) by mouth 2 (two) times daily before a meal. 05/23/15  Yes Gladstone Lighter, MD  phenylephrine-shark liver oil-mineral oil-petrolatum (PREPARATION H) 0.25-3-14-71.9 % rectal ointment Place 1 application rectally 2 (two) times daily as needed for hemorrhoids.   Yes Historical Provider, MD  polyethylene glycol (MIRALAX / GLYCOLAX) packet Take 17 g by mouth daily as needed for mild constipation.   Yes Historical Provider, MD  potassium chloride SA (K-DUR,KLOR-CON) 20 MEQ tablet Take 20 mEq by mouth daily.   Yes Historical Provider, MD  predniSONE (DELTASONE) 10 MG tablet Take 30 mg by mouth every other day.   Yes Historical Provider, MD  pyridostigmine (MESTINON) 60 MG tablet Take 60 mg by mouth 3 (three) times daily.    Yes Historical Provider, MD  QUEtiapine (SEROQUEL) 200 MG tablet Take 1 tablet (200  mg total) by mouth at bedtime. 03/29/15  Yes Rainey Pines, MD  sodium chloride (OCEAN) 0.65 % SOLN nasal spray Place 2 sprays into both nostrils daily as needed for congestion.    Yes Historical Provider, MD  traMADol (ULTRAM) 50 MG tablet Take 50 mg by mouth 2 (two) times daily as needed.   Yes Historical Provider, MD  traZODone (DESYREL) 50 MG tablet Take 1 tablet (50 mg total) by mouth at bedtime. 05/23/15  Yes Gladstone Lighter, MD   Allergies  Allergen Reactions  . Lactose Intolerance (Gi) Other (See Comments)    Reaction:  GI upset   . Lithium Other (See Comments)    Reaction:  Unknown   . Penicillins Rash    FAMILY HISTORY:  Family History  Problem Relation Age of Onset  . Heart disease Father   . Other Mother     ruptured hernia  . Breast cancer Sister   . Heart disease Sister   . Diabetes Mellitus II Sister     x2   SOCIAL HISTORY:  reports that she has been smoking Cigarettes.  She started smoking about 50 years ago. She has a 100 pack-year smoking history. She has never used smokeless tobacco. She reports that she does not drink alcohol or use illicit drugs.  REVIEW OF SYSTEMS:   Constitutional: Feels well. Cardiovascular: No chest pain.  Pulmonary: Denies dyspnea.   The remainder of systems were reviewed and were found to be negative other than what is documented in the HPI.    VITAL SIGNS: Temp:  [92.6 F (33.7 C)-97.8 F (36.6 C)] 92.6 F (33.7 C) (10/03 1900) Pulse Rate:  [56-98] 83 (10/04 0500) Resp:  [16-28] 19 (10/04 0500) BP: (69-149)/(32-85) 96/60 mmHg (10/04 0500) SpO2:  [87 %-100 %] 95 % (10/04 0855) FiO2 (%):  [35 %-75 %] 35 % (10/04 0855) HEMODYNAMICS:   VENTILATOR SETTINGS: Vent Mode:  [-] PRVC FiO2 (%):  [35 %-75 %] 35 % Set Rate:  [15 bmp] 15 bmp Vt Set:  [500 mL] 500 mL PEEP:  [5 cmH20] 5 cmH20 INTAKE / OUTPUT:  Intake/Output Summary (Last 24 hours) at 06/12/15 0859 Last data filed at 06/12/15 0500  Gross per 24 hour  Intake  274.9 ml   Output    400 ml  Net -125.1 ml    Physical Examination:   VS: BP 96/60 mmHg  Pulse 83  Temp(Src) 92.6 F (33.7 C) (Axillary)  Resp 19  Ht 5\' 4"  (1.626 m)  Wt 65.499 kg (144 lb 6.4 oz)  BMI 24.77 kg/m2  SpO2 95%  General Appearance: No distress  Neuro:without focal findings, mental  status, sensation grossly normal  HEENT: PERRLA, EOM intact, no ptosis, no other lesions noticed;  Pulmonary: normal breath sounds., diaphragmatic excursion normal.   CardiovascularNormal S1,S2.  No m/r/g.    Abdomen: Benign, Soft, non-tender,  Renal:  No costovertebral tenderness  GU:  Not performed at this time. Endoc: No evident thyromegaly, no signs of acromegaly. Skin:   warm, no rashes, no ecchymosis  Extremities: normal, no cyanosis, clubbing,   LABS: Reviewed   LABORATORY PANEL:   CBC  Recent Labs Lab 06/09/15 0527  WBC 6.4  HGB 9.8*  HCT 31.0*  PLT 252    Chemistries   Recent Labs Lab 06/08/15 2310  06/09/15 0527  06/12/15 0522  NA 135  --  143  < > 144  K 6.2*  --  4.3  < > 3.8  CL 102  --  104  < > 104  CO2 25  --  31  < > 31  GLUCOSE 236*  --  113*  < > 88  BUN 25*  --  25*  < > 36*  CREATININE 2.15*  --  1.96*  < > 1.69*  CALCIUM 8.8*  --  9.2  < > 8.6*  MG  --   --  2.0  --   --   PHOS  --   < > 4.5  --  2.8  AST 26  --   --   --   --   ALT 18  --   --   --   --   ALKPHOS 114  --   --   --   --   BILITOT 0.7  --   --   --   --   < > = values in this interval not displayed.   Recent Labs Lab 06/11/15 1118 06/11/15 1639 06/11/15 1652 06/11/15 1928 06/11/15 2004 06/12/15 0027  GLUCAP 402* 163* 243* 173* 141* 138*    Recent Labs Lab 06/09/15 0003 06/11/15 2150  PHART 7.46* 7.40  PCO2ART 32 56*  PO2ART 141* 73*    Recent Labs Lab 06/08/15 2310 06/12/15 0522  AST 26  --   ALT 18  --   ALKPHOS 114  --   BILITOT 0.7  --   ALBUMIN 3.2* 2.2*    Cardiac Enzymes  Recent Labs Lab 06/08/15 2310  TROPONINI 0.12*    RADIOLOGY:  Dg  Chest Port 1 View  06/11/2015   CLINICAL DATA:  ICU patient. History of melanoma, diabetes and hypertension. Myasthenia gravis.  EXAM: PORTABLE CHEST 1 VIEW  COMPARISON:  06/08/2015 and 05/20/2015.  FINDINGS: 2009 hours. Endotracheal tube tip is in the midtrachea. A nasogastric tube projects below the diaphragm, tip not visualized. There are worsening bibasilar airspace opacities with probable bilateral pleural effusions, worrisome for aspiration pneumonia. The pulmonary vasculature remain somewhat indistinct, although improved from 3 days ago. There is no pneumothorax. The bones appear unchanged.  IMPRESSION: 1. The endotracheal and nasogastric tubes appear in satisfactory position. 2. Worsening bibasilar airspace opacities worrisome for aspiration pneumonia   Electronically Signed   By: Richardean Sale M.D.   On: 06/11/2015 20:35       --Marda Stalker, MD.  Board Certified in Internal Medicine, Pulmonary Medicine, [place], and Sleep Medicine.  Pager [phone] [place] [facility] Office Number: [phone]  Patricia Pesa, M.D.  Vilinda Boehringer, M.D.  Merton Border, M.D   06/12/2015, 8:59 AM

## 2015-06-12 NOTE — Progress Notes (Signed)
Patient requested water. She coughed several minutes after she drank the water./ [facility] was elevated.

## 2015-06-12 NOTE — Progress Notes (Signed)
[place] [facility] [place] NAME: Mackenzie Rose    MR#:  [id number]  DATE OF BIRTH:  Jun 04, 1946  SUBJECTIVE:  Events from last nite noted. Patient went into acute respiratory distress rapid response was called. She was transferred to the ICU intubated and placed on the ventilator. Patient received 80 mg IV Lasix last night. Currently sedated and on IV fentanyl drip.     Review of Systems  Constitutional: Negative for fever, chills and weight loss.  HENT: Negative for ear discharge, ear pain and nosebleeds.   Eyes: Negative for blurred vision, pain and discharge.  Respiratory: Positive for cough and shortness of breath. Negative for sputum production, wheezing and stridor.   Cardiovascular: Negative for chest pain, palpitations, orthopnea and PND.  Gastrointestinal: Negative for nausea, vomiting, abdominal pain and diarrhea.  Genitourinary: Negative for urgency and frequency.  Musculoskeletal: Positive for joint pain. Negative for back pain.  Skin: Positive for rash.  Neurological: Positive for weakness. Negative for sensory change, speech change and focal weakness.  Psychiatric/Behavioral: Negative for depression and hallucinations. The patient is not nervous/anxious.   All other systems reviewed and are negative.  Tolerating Diet:npo Tolerating PT: eval pending  DRUG ALLERGIES:   Allergies  Allergen Reactions  . Lactose Intolerance (Gi) Other (See Comments)    Reaction:  GI upset   . Lithium Other (See Comments)    Reaction:  Unknown   . Penicillins Rash    VITALS:  Blood pressure 130/53, pulse 87, temperature 92.6 F (33.7 C), temperature source Axillary, resp. rate 19, height 5\' 4"  (1.626 m), weight 65.499 kg (144 lb 6.4 oz), SpO2 95 %.  PHYSICAL EXAMINATION:   Physical Exam  GENERAL:  69 y.o.-year-old patient lying in the bed with no acute distress. Critically ill intubated on the ventilator EYES: Pupils equal, round,  reactive to light and accommodation. No scleral icterus. Extraocular muscles intact.  HEENT: Head atraumatic, normocephalic. Oropharynx and nasopharynx clear. BIPAI NECK:  Supple, no jugular venous distention. No thyroid enlargement, no tenderness.  LUNGS: decreased breath sounds bilaterally, no wheezing, ++ rales, no rhonchi. No use of accessory muscles of respiration.  CARDIOVASCULAR: S1, S2 normal. No murmurs, rubs, or gallops.  ABDOMEN: Soft, nontender, nondistended. Bowel sounds present. No organomegaly or mass.  EXTREMITIES: No cyanosis, clubbing  ++ edema b/l.    NEUROLOGIC: Patient on the vent  PSYCHIATRIC: Sedated and on the vent SKIN: right groin/thigh fungal rash lesion-improved remarkably   LABORATORY PANEL:   CBC  Recent Labs Lab 06/09/15 0527  WBC 6.4  HGB 9.8*  HCT 31.0*  PLT 252    Chemistries   Recent Labs Lab 06/08/15 2310 06/09/15 0527  06/12/15 0522  NA 135 143  < > 144  K 6.2* 4.3  < > 3.8  CL 102 104  < > 104  CO2 25 31  < > 31  GLUCOSE 236* 113*  < > 88  BUN 25* 25*  < > 36*  CREATININE 2.15* 1.96*  < > 1.69*  CALCIUM 8.8* 9.2  < > 8.6*  MG  --  2.0  --   --   AST 26  --   --   --   ALT 18  --   --   --   ALKPHOS 114  --   --   --   BILITOT 0.7  --   --   --   < > = values in this interval not displayed.  Cardiac Enzymes  Recent Labs Lab 06/08/15 2310  TROPONINI 0.12*    RADIOLOGY:  Dg Chest Port 1 View  06/11/2015   CLINICAL DATA:  ICU patient. History of melanoma, diabetes and hypertension. Myasthenia gravis.  EXAM: PORTABLE CHEST 1 VIEW  COMPARISON:  06/08/2015 and 05/20/2015.  FINDINGS: 2009 hours. Endotracheal tube tip is in the midtrachea. A nasogastric tube projects below the diaphragm, tip not visualized. There are worsening bibasilar airspace opacities with probable bilateral pleural effusions, worrisome for aspiration pneumonia. The pulmonary vasculature remain somewhat indistinct, although improved from 3 days ago. There is  no pneumothorax. The bones appear unchanged.  IMPRESSION: 1. The endotracheal and nasogastric tubes appear in satisfactory position. 2. Worsening bibasilar airspace opacities worrisome for aspiration pneumonia   Electronically Signed   By: Richardean Sale M.D.   On: 06/11/2015 20:35     ASSESSMENT AND PLAN:  69 y.o. female who presents with shortness of breath, worsening over the last 3-4 days. Patient states that she's also had some intermittent chest pains throughout the same time. She states that she's had some episodes of acute CHF in the past, but denies being chronically followed for CHF  1. acute hypoxic respiratory failure secondary to  Acute systolic CHF (congestive heart failure)  -Patient transferred to ICU given impending respiratory failure got intubated and on the ventilator October 3 -sats much imporved -wean vent per pulmonary recommendations -changed lasix to IV 80 mg bid  -echo shows EF 35% (was 55-60% on July 2016) -cardiology consult with dr Clayborn Bigness noted -added coreg. Cont hydralazine and diltiazem -no ace due to CKD stage III -creat 1.64  2. Acute on Chronic RF (acute renal failure) CKD-III -diuresing cautiously this time. -Appreciate nephrology consultation  Baseline creat 1.15. -Renal ultrasound pending. Avoid nephrotoxins  3. Myasthenia gravis - continue home meds for this  4.HTN (hypertension) - elevated here, continue home antihypertensives, diuresis may help with this some. Can use additional IV here antihypertensives when necessary  5.Type 2 diabetes mellitus - sliding scale   6.COPD (chronic obstructive pulmonary disease) - continue home inhalers, when necessary DuoNeb's  7.h/o chronic afib -on diltiazem. No anticoagulation due to falls  8.Bipolar affective disorder, mixed - continue home meds  9. Right groin skin rash appears candidial Nystatin powder and po fluconazolex1 -does not have celluliitis. Rest shows much improvement.  Management  plans discussed with the patient and  in agreement.  CODE STATUS:FULL  DVT Prophylaxis: lovenox  TOTAL TIME critical Spent in CARE OF THIS PATIENT: 40 minutes.  >50% time spent on counselling and coordination of care with ICU attending, RN  POSSIBLE D/C IN 1-2 days DEPENDING ON CLINICAL CONDITION.   Carol Loftin M.D on 06/12/2015 at 9:03 AM  Between 7am to 6pm - Pager - [phone]  After 6pm go to [email or web address] - password [email or web address] [facility]  Office  [phone]  CC: Primary care physician; Donato Schultz, MD

## 2015-06-12 NOTE — Progress Notes (Signed)
RT to room to extubate patient per verbal order from Dr. Juanell Fairly.  Pt's ETT suctioned and orally suctioned prior to extubation.  Patient placed on 5LPM [place] post extubation.  Patient tolerated extubation well, no complications noted.  Will continue to monitor.

## 2015-06-12 NOTE — Progress Notes (Signed)
Nutrition Follow-up   INTERVENTION:   Coordination of Care: discussed nutritional poc during ICU rounds, per MD Ramachandran, holding off on initiation of EN today. If pt remains on vent, will reassess tomorrow on follow-up   NUTRITION DIAGNOSIS:   Inadequate oral intake related to acute illness as evidenced by NPO status.  GOAL:   Patient will meet greater than or equal to 90% of their needs  MONITOR:    (Energy Intake, Anthropometrics, Digestive System, glucose Profile)  REASON FOR ASSESSMENT:   Malnutrition Screening Tool, Diagnosis    ASSESSMENT:   Pt admitted with SOB secondary to acute CHF. Pt developed respiratory disressed las night, rapid response called, pt transferred to ICU and required intubation, currently sedated on vent   Diet Order:  Diet 2 gram sodium Room service appropriate?: Yes; Fluid consistency:: Thin   Energy Intake: recorded po intake 75-100% of meals  Electrolyte and Renal Profile:  Recent Labs Lab 06/09/15 0527 06/10/15 0501 06/11/15 0413 06/12/15 0522  BUN 25* 33* 32* 36*  CREATININE 1.96* 1.98* 1.64* 1.69*  NA 143 141 139 144  K 4.3 3.3* 3.5 3.8  MG 2.0  --   --   --   PHOS 4.5  --   --  2.8   Glucose Profile:   Recent Labs  06/11/15 2004 06/12/15 0027 06/12/15 1228  GLUCAP 141* 138* 121*   Meds: lasix, ss novolog  Last BM:  06/07/2015  Height:   Ht Readings from Last 1 Encounters:  06/11/15 5\' 4"  (1.626 m)    Weight:   Wt Readings from Last 1 Encounters:  06/11/15 144 lb 6.4 oz (65.499 kg)    Filed Weights   06/09/15 1035 06/10/15 0507 06/11/15 0633  Weight: 144 lb (65.318 kg) 142 lb 8 oz (64.638 kg) 144 lb 6.4 oz (65.499 kg)    BMI:  Body mass index is 24.77 kg/(m^2).  Estimated Nutritional Needs:   Kcal:  reassess on follow-up, no documented temp in last 24 hours  Protein:  83-110 g (1.5-2.0 g/kg)   Fluid:  1375-1650 mL (25-30 ml/kg)   EDUCATION NEEDS:   No education needs identified at this  time  [place], [place], LDN [phone] Pager

## 2015-06-12 NOTE — Progress Notes (Signed)
eLink Physician-Brief Progress Note Patient Name: Mackenzie Rose DOB: [birth year] MRN: [id number]   Date of Service  06/12/2015  HPI/Events of Note  Intubated for hypoxia,CHF + aspiration Agitated on int sedation Propofol gtt dropped BP  eICU Interventions  Fent gtt Int versed     Intervention Category Major Interventions: Delirium, psychosis, severe agitation - evaluation and management Intermediate Interventions: Communication with other healthcare providers and/or family  Jolaine Fryberger V. 06/12/2015, 12:55 AM

## 2015-06-13 ENCOUNTER — Inpatient Hospital Stay: Payer: Medicare Other

## 2015-06-13 DIAGNOSIS — F3160 Bipolar disorder, current episode mixed, unspecified: Secondary | ICD-10-CM

## 2015-06-13 LAB — BASIC METABOLIC PANEL
Anion gap: 14 (ref 5–15)
BUN: 42 mg/dL — AB (ref 6–20)
CHLORIDE: 101 mmol/L (ref 101–111)
CO2: 31 mmol/L (ref 22–32)
Calcium: 9.4 mg/dL (ref 8.9–10.3)
Creatinine, Ser: 1.71 mg/dL — ABNORMAL HIGH (ref 0.44–1.00)
GFR calc Af Amer: 34 mL/min — ABNORMAL LOW (ref >60)
GFR calc non Af Amer: 29 mL/min — ABNORMAL LOW (ref >60)
GLUCOSE: 103 mg/dL — AB (ref 65–99)
POTASSIUM: 3.7 mmol/L (ref 3.5–5.1)
Sodium: 146 mmol/L — ABNORMAL HIGH (ref 135–145)

## 2015-06-13 LAB — GLUCOSE, CAPILLARY
GLUCOSE-CAPILLARY: 141 mg/dL — AB (ref 65–99)
Glucose-Capillary: 119 mg/dL — ABNORMAL HIGH (ref 65–99)
Glucose-Capillary: 136 mg/dL — ABNORMAL HIGH (ref 65–99)
Glucose-Capillary: 154 mg/dL — ABNORMAL HIGH (ref 65–99)
Glucose-Capillary: 175 mg/dL — ABNORMAL HIGH (ref 65–99)

## 2015-06-13 LAB — CBC
HEMATOCRIT: 33.8 % — AB (ref 35.0–47.0)
HEMOGLOBIN: 10.3 g/dL — AB (ref 12.0–16.0)
MCH: 25.3 pg — AB (ref 26.0–34.0)
MCHC: 30.6 g/dL — ABNORMAL LOW (ref 32.0–36.0)
MCV: 82.8 fL (ref 80.0–100.0)
Platelets: 235 10*3/uL (ref 150–440)
RBC: 4.08 MIL/uL (ref 3.80–5.20)
RDW: 29 % — ABNORMAL HIGH (ref 11.5–14.5)
WBC: 10.3 10*3/uL (ref 3.6–11.0)

## 2015-06-13 LAB — BLOOD GAS, ARTERIAL
ALLENS TEST (PASS/FAIL): POSITIVE — AB
Acid-Base Excess: 10.8 mmol/L — ABNORMAL HIGH (ref 0.0–3.0)
Bicarbonate: 35.7 mEq/L — ABNORMAL HIGH (ref 21.0–28.0)
FIO2: 0.45
O2 SAT: 93.6 %
PATIENT TEMPERATURE: 37
pCO2 arterial: 48 mmHg (ref 32.0–48.0)
pH, Arterial: 7.48 — ABNORMAL HIGH (ref 7.350–7.450)
pO2, Arterial: 64 mmHg — ABNORMAL LOW (ref 83.0–108.0)

## 2015-06-13 MED ORDER — LEVOFLOXACIN IN D5W 750 MG/150ML IV SOLN
750.0000 mg | INTRAVENOUS | Status: DC
  Administered 2015-06-13: 750 mg via INTRAVENOUS
  Filled 2015-06-13: qty 150

## 2015-06-13 MED ORDER — HALOPERIDOL LACTATE 5 MG/ML IJ SOLN
1.0000 mg | Freq: Four times a day (QID) | INTRAMUSCULAR | Status: DC | PRN
  Administered 2015-06-13 – 2015-06-14 (×2): 1 mg via INTRAVENOUS
  Filled 2015-06-13 (×2): qty 1

## 2015-06-13 NOTE — Progress Notes (Signed)
   06/13/15 1900  Clinical Encounter Type  Visited With Patient  Visit Type Other (Comment)  Referral From Patient  Consult/Referral To Chaplain  Spiritual Encounters  Spiritual Needs Emotional  Chaplain rounded in the unit and again called into patient's room. Checked with nurse for an update and she indicated what patient's might desire. I visited with the patient and sat with her but was unable to provide assistance or ease her anxiety. Chaplain Taronda Comacho A. Tyeasha Ebbs Ext. [phone]

## 2015-06-13 NOTE — Progress Notes (Signed)
[facility] [place] NAME: Mackenzie Rose    MR#:  [id number]  DATE OF BIRTH:  May 28, 1946  SUBJECTIVE:  Now extubated and on 6 liter [place] -fixated on the thought that she cannot breathe sats >92%    Review of Systems  Constitutional: Negative for fever, chills and weight loss.  HENT: Negative for ear discharge, ear pain and nosebleeds.   Eyes: Negative for blurred vision, pain and discharge.  Respiratory: Positive for cough and shortness of breath. Negative for sputum production, wheezing and stridor.   Cardiovascular: Negative for chest pain, palpitations, orthopnea and PND.  Gastrointestinal: Negative for nausea, vomiting, abdominal pain and diarrhea.  Genitourinary: Negative for urgency and frequency.  Musculoskeletal: Positive for joint pain. Negative for back pain.  Skin: Positive for rash.  Neurological: Positive for weakness. Negative for sensory change, speech change and focal weakness.  Psychiatric/Behavioral: Negative for depression and hallucinations. The patient is not nervous/anxious.   All other systems reviewed and are negative.  Tolerating Diet:speech eval Tolerating PT: eval pending  DRUG ALLERGIES:   Allergies  Allergen Reactions  . Lactose Intolerance (Gi) Other (See Comments)    Reaction:  GI upset   . Lithium Other (See Comments)    Reaction:  Unknown   . Penicillins Rash    VITALS:  Blood pressure 136/72, pulse 80, temperature 98.5 F (36.9 C), temperature source Axillary, resp. rate 39, height 5\' 4"  (1.626 m), weight 65.2 kg (143 lb 11.8 oz), SpO2 94 %.  PHYSICAL EXAMINATION:   Physical Exam  GENERAL:  68 y.o.-year-old patient lying in the bed with no acute distress. Critically ill intubated on the ventilator EYES: Pupils equal, round, reactive to light and accommodation. No scleral icterus. Extraocular muscles intact.  HEENT: Head atraumatic, normocephalic. Oropharynx and nasopharynx clear.  BIPAI NECK:  Supple, no jugular venous distention. No thyroid enlargement, no tenderness.  LUNGS: decreased breath sounds bilaterally, no wheezing, + rales, no rhonchi. No use of accessory muscles of respiration.  CARDIOVASCULAR: S1, S2 normal. No murmurs, rubs, or gallops.  ABDOMEN: Soft, nontender, nondistended. Bowel sounds present. No organomegaly or mass.  EXTREMITIES: No cyanosis, clubbing  ++ edema b/l.    NEUROLOGIC: Patient on the vent  PSYCHIATRIC: Sedated and on the vent SKIN: right groin/thigh fungal rash lesion-improved remarkably   LABORATORY PANEL:   CBC  Recent Labs Lab 06/13/15 0448  WBC 10.3  HGB 10.3*  HCT 33.8*  PLT 235    Chemistries   Recent Labs Lab 06/08/15 2310 06/09/15 0527  06/13/15 0448  NA 135 143  < > 146*  K 6.2* 4.3  < > 3.7  CL 102 104  < > 101  CO2 25 31  < > 31  GLUCOSE 236* 113*  < > 103*  BUN 25* 25*  < > 42*  CREATININE 2.15* 1.96*  < > 1.71*  CALCIUM 8.8* 9.2  < > 9.4  MG  --  2.0  --   --   AST 26  --   --   --   ALT 18  --   --   --   ALKPHOS 114  --   --   --   BILITOT 0.7  --   --   --   < > = values in this interval not displayed.  Cardiac Enzymes  Recent Labs Lab 06/08/15 2310  TROPONINI 0.12*    RADIOLOGY:  Dg Chest 1 View  06/13/2015   CLINICAL  DATA:  COPD, CHF, diabetes, myasthenia gravis, dementia.  EXAM: CHEST 1 VIEW  COMPARISON:  Portable chest x-ray of June 11, 2015.  FINDINGS: The lungs are adequately inflated. Bibasilar atelectasis or pneumonia persists. There is partial obscuration of the hemidiaphragms. The cardiac silhouette is enlarged. There is mild central pulmonary vascular prominence. The trachea and esophagus have been extubated.  IMPRESSION: Slight interval improvement in the appearance of the pulmonary interstitium consistent with resolving interstitial edema. Persistent bibasilar atelectasis or pneumonia. A small left pleural effusion is present.   Electronically Signed   By: David  Martinique  M.D.   On: 06/13/2015 07:25   US Renal  06/12/2015   CLINICAL DATA:  Kidney cysts, mass  EXAM: RENAL / URINARY TRACT ULTRASOUND COMPLETE  COMPARISON:  CT abdomen pelvis September 12 26  FINDINGS: Right Kidney:  Length: 12.2 cm. There is diffuse increased echotexture of the right kidney. There is a simple cyst in the upper pole right kidney measuring 3 x 2.9 x 2.9 cm. There is a second cyst in the lower pole right kidney measuring 3.1 x 2.4 x 2.8 cm. There is no hydronephrosis.  Left Kidney:  Length: 7.7 cm. There is diffuse increased echotexture. There is a cyst in the lower pole left kidney measuring 1.5 x 1 x 1.3 cm  Bladder:  The bladder is decompressed with Foley catheter in place.  IMPRESSION: Diffuse increased echotexture of bilateral kidneys consistent with medical renal disease.  Bilateral kidney cysts.  Atrophic left kidney.   Electronically Signed   By: Abelardo Diesel M.D.   On: 06/12/2015 19:02   Dg Chest Port 1 View  06/11/2015   CLINICAL DATA:  ICU patient. History of melanoma, diabetes and hypertension. Myasthenia gravis.  EXAM: PORTABLE CHEST 1 VIEW  COMPARISON:  06/08/2015 and 05/20/2015.  FINDINGS: 2009 hours. Endotracheal tube tip is in the midtrachea. A nasogastric tube projects below the diaphragm, tip not visualized. There are worsening bibasilar airspace opacities with probable bilateral pleural effusions, worrisome for aspiration pneumonia. The pulmonary vasculature remain somewhat indistinct, although improved from 3 days ago. There is no pneumothorax. The bones appear unchanged.  IMPRESSION: 1. The endotracheal and nasogastric tubes appear in satisfactory position. 2. Worsening bibasilar airspace opacities worrisome for aspiration pneumonia   Electronically Signed   By: Richardean Sale M.D.   On: 06/11/2015 20:35     ASSESSMENT AND PLAN:  69 y.o. female who presents with shortness of breath, worsening over the last 3-4 days. Patient states that she's also had some intermittent  chest pains throughout the same time. She states that she's had some episodes of acute CHF in the past, but denies being chronically followed for CHF  1. acute hypoxic respiratory failure secondary to  Acute systolic CHF (congestive heart failure)  -Patient transferred to ICU given impending respiratory failure got intubated and on the ventilator October 3 -sats much imporved -wean vent per pulmonary recommendations -changed lasix to IV 80 mg bid --->UOP 2000 cc over 24 hours -echo shows EF 35% (was 55-60% on July 2016) -cardiology consult with dr Clayborn Bigness noted -added coreg. Cont hydralazine and diltiazem -no ace due to CKD stage III -creat 1.94-->1.64-->1,71  2. Acute on Chronic RF (acute renal failure) CKD-III -diuresing cautiously this time. -Appreciate nephrology consultation  Baseline creat 1.15. -Renal ultrasound bilateral kidney cysts. Avoid nephrotoxins  3. Myasthenia gravis - continue home meds for this  4.HTN (hypertension) - elevated here, continue home antihypertensives, diuresis may help with this some.   5.Type 2  diabetes mellitus - sliding scale   6.COPD (chronic obstructive pulmonary disease) - continue home inhalers, when necessary DuoNeb's  7.h/o chronic afib -on diltiazem. No anticoagulation due to falls  8.Bipolar affective disorder, mixed - continue home meds  9. Right groin skin rash appears candidial Nystatin powder and po fluconazolex1 -does not have celluliitis. Rest shows much improvement.  Management plans discussed with the patient and  in agreement.  CODE STATUS:FULL  DVT Prophylaxis: lovenox  TOTAL TIME critical Spent in CARE OF THIS PATIENT: 40 minutes.  >50% time spent on counselling and coordination of care with ICU attending, RN Son updated  POSSIBLE D/C IN ?? DEPENDING ON CLINICAL CONDITION.   Antonino Nienhuis M.D on 06/13/2015 at 9:03 AM  Between 7am to 6pm - Pager - [phone]  After 6pm go to [email or web address] - password [email or web address]  [facility]  Office  [phone]  CC: Primary care physician; Donato Schultz, MD

## 2015-06-13 NOTE — Progress Notes (Signed)
[facility]  ROUNDING NOTE   Subjective:   Doing well this morning. Seems to have failed her swallow eval.  UOP 2000  Creatinine 1.71  Objective:  Vital signs in last 24 hours:  Temp:  [97.9 F (36.6 C)-98.5 F (36.9 C)] 98.5 F (36.9 C) (10/05 0000) Pulse Rate:  [58-88] 80 (10/05 0400) Resp:  [16-39] 39 (10/05 0400) BP: (52-143)/(34-72) 136/72 mmHg (10/05 0400) SpO2:  [83 %-99 %] 94 % (10/05 0400) FiO2 (%):  [35 %-55 %] 45 % (10/05 0351) Weight:  [65.2 kg (143 lb 11.8 oz)] 65.2 kg (143 lb 11.8 oz) (10/05 0500)  Weight change:  Filed Weights   06/10/15 0507 06/11/15 0633 06/13/15 0500  Weight: 64.638 kg (142 lb 8 oz) 65.499 kg (144 lb 6.4 oz) 65.2 kg (143 lb 11.8 oz)    Intake/Output: I/O last 3 completed shifts: In: 234.9 [I.V.:4.9; NG/GT:30; IV Piggyback:200] Out: 2400 [Urine:2400]   Intake/Output this shift:     Physical Exam: General: NAD  Head: Normocephalic, atraumatic.  Eyes: Anicteric, PERRL  Neck: Supple, trachea midline  Lungs:  Bilateral crackles,   Heart: Regular rate and rhythm  Abdomen:  Soft, nontender  Extremities: 1+ peripheral edema.  Neurologic: Nonfocal, moving all four extremities  Skin: No lesions  GU Foley with urine    Basic Metabolic Panel:  Recent Labs Lab 06/09/15 0527 06/10/15 0501 06/11/15 0413 06/12/15 0522 06/13/15 0448  NA 143 141 139 144 146*  K 4.3 3.3* 3.5 3.8 3.7  CL 104 100* 101 104 101  CO2 31 33* _0 GLUCOSE 113* 149* 116* 88 103*  BUN 25* 33* 32* 36* 42*  CREATININE 1.96* 1.98* 1.64* 1.69* 1.71*  CALCIUM 9.2 8.6* 8.5* 8.6* 9.4  MG 2.0  --   --   --   --   PHOS 4.5  --   --  2.8  --     Liver Function Tests:  Recent Labs Lab 06/08/15 2310 06/12/15 0522  AST 26  --   ALT 18  --   ALKPHOS 114  --   BILITOT 0.7  --   PROT 6.1*  --   ALBUMIN 3.2* 2.2*   No results for input(s): LIPASE, AMYLASE in the last 168 hours. No results for input(s): AMMONIA in the last 168  hours.  CBC:  Recent Labs Lab 06/08/15 2310 06/09/15 0527 06/13/15 0448  WBC 9.4 6.4 10.3  HGB 10.1* 9.8* 10.3*  HCT 33.4* 31.0* 33.8*  MCV 83.0 81.1 82.8  PLT 259 252 235    Cardiac Enzymes:  Recent Labs Lab 06/08/15 2310  TROPONINI 0.12*    BNP: Invalid input(s): POCBNP  CBG:  Recent Labs Lab 06/12/15 0027 06/12/15 1228 06/12/15 1553 06/13/15 0354 06/13/15 0710  GLUCAP 138* 121* 80 119* 136*    Microbiology: Results for orders placed or performed during the hospital encounter of 06/08/15  MRSA PCR Screening     Status: None   Collection Time: 06/09/15  5:26 AM  Result Value Ref Range Status   MRSA by PCR NEGATIVE NEGATIVE Final    Comment:        The GeneXpert MRSA Assay (FDA approved for NASAL specimens only), is one component of a comprehensive MRSA colonization surveillance program. It is not intended to diagnose MRSA infection nor to guide or monitor treatment for MRSA infections.     Coagulation Studies: No results for input(s): LABPROT, INR in the last 72 hours.  Urinalysis:  Recent Labs  06/11/15  [place] 1.008  PHURINE 6.0  GLUCOSEU NEGATIVE  HGBUR NEGATIVE  BILIRUBINUR NEGATIVE  KETONESUR NEGATIVE  PROTEINUR 100*  NITRITE NEGATIVE  LEUKOCYTESUR NEGATIVE      Imaging: Dg Chest 1 View  06/13/2015   CLINICAL DATA:  COPD, CHF, diabetes, myasthenia gravis, dementia.  EXAM: CHEST 1 VIEW  COMPARISON:  Portable chest x-ray of June 11, 2015.  FINDINGS: The lungs are adequately inflated. Bibasilar atelectasis or pneumonia persists. There is partial obscuration of the hemidiaphragms. The cardiac silhouette is enlarged. There is mild central pulmonary vascular prominence. The trachea and esophagus have been extubated.  IMPRESSION: Slight interval improvement in the appearance of the pulmonary interstitium consistent with resolving interstitial edema. Persistent bibasilar atelectasis or pneumonia. A small left  pleural effusion is present.   Electronically Signed   By: David  Martinique M.D.   On: 06/13/2015 07:25   US Renal  06/12/2015   CLINICAL DATA:  Kidney cysts, mass  EXAM: RENAL / URINARY TRACT ULTRASOUND COMPLETE  COMPARISON:  CT abdomen pelvis September 12 26  FINDINGS: Right Kidney:  Length: 12.2 cm. There is diffuse increased echotexture of the right kidney. There is a simple cyst in the upper pole right kidney measuring 3 x 2.9 x 2.9 cm. There is a second cyst in the lower pole right kidney measuring 3.1 x 2.4 x 2.8 cm. There is no hydronephrosis.  Left Kidney:  Length: 7.7 cm. There is diffuse increased echotexture. There is a cyst in the lower pole left kidney measuring 1.5 x 1 x 1.3 cm  Bladder:  The bladder is decompressed with Foley catheter in place.  IMPRESSION: Diffuse increased echotexture of bilateral kidneys consistent with medical renal disease.  Bilateral kidney cysts.  Atrophic left kidney.   Electronically Signed   By: Abelardo Diesel M.D.   On: 06/12/2015 19:02   Dg Chest Port 1 View  06/11/2015   CLINICAL DATA:  ICU patient. History of melanoma, diabetes and hypertension. Myasthenia gravis.  EXAM: PORTABLE CHEST 1 VIEW  COMPARISON:  06/08/2015 and 05/20/2015.  FINDINGS: 2009 hours. Endotracheal tube tip is in the midtrachea. A nasogastric tube projects below the diaphragm, tip not visualized. There are worsening bibasilar airspace opacities with probable bilateral pleural effusions, worrisome for aspiration pneumonia. The pulmonary vasculature remain somewhat indistinct, although improved from 3 days ago. There is no pneumothorax. The bones appear unchanged.  IMPRESSION: 1. The endotracheal and nasogastric tubes appear in satisfactory position. 2. Worsening bibasilar airspace opacities worrisome for aspiration pneumonia   Electronically Signed   By: Richardean Sale M.D.   On: 06/11/2015 20:35     Medications:     . antiseptic oral rinse  7 mL Mouth Rinse QID  . busPIRone  10 mg Oral BID   . carvedilol  3.125 mg Oral BID WC  . chlorhexidine gluconate  15 mL Mouth Rinse BID  . clindamycin (CLEOCIN) IV  300 mg Intravenous 3 times per day  . diltiazem  180 mg Oral Daily  . donepezil  10 mg Oral Daily  . fentaNYL (SUBLIMAZE) injection  50 mcg Intravenous Once  . furosemide  80 mg Intravenous Q12H  . heparin  5,000 Units Subcutaneous Q12H  . hydrALAZINE  50 mg Oral TID  . insulin aspart  0-9 Units Subcutaneous TID WC  . lamoTRIgine  100 mg Oral BID  . latanoprost  1 drop Both Eyes QHS  . mometasone-formoterol  2 puff Inhalation BID  . nicotine  7 mg Transdermal  Daily  . nystatin   Topical TID  . pantoprazole  40 mg Oral BID AC  . potassium chloride  20 mEq Oral BID  . pyridostigmine  60 mg Oral TID  . sodium chloride  3 mL Intravenous Q12H  . spironolactone  25 mg Oral Daily  . traZODone  50 mg Oral QHS   albuterol, ALPRAZolam, fentaNYL (SUBLIMAZE) injection, guaiFENesin, ipratropium-albuterol, midazolam, midazolam, ondansetron **OR** ondansetron (ZOFRAN) IV  Assessment/ Plan:  [name] is a 69 y.o. white female with coronary artery disease, atrial fibrillation, hypertension, hyperlipidemia, diabetes mellitus type II, COPD, tremor, systolic congestive heart failure, depression, anxiety, dementia, bipolar disease who was admitted on 06/08/2015 for Hyperkalemia [E87.5] Acute renal insufficiency [N28.9] Acute congestive heart failure, unspecified congestive heart failure type ([place]) [I50.9]  1. Acute Renal Failure with hyperkalemia on chronic kidney disease stage III with proteinuria: Potassium now at goal. Admitted with creatinine of 2.15, now trending downward to creatinine of 1.71. Baseline creatinine of 1.19, eGFR of 45. Acute renal failure secondary to acute cardiopulmonary syndrome exacerbated by acute exacerbation of systolic congestive heart failure.  Chronic Kidney Disease with proteinuria secondary to diabetic nephropathy and renal artery stenosis. Left  kidney atrophy on CT 05/21/2015.  - Will need outpatient renal dopplers.  - Renally dose all medications  2. Renal mass: right: seen on CT. Ultrasound found these to be simple cysts.   3. Acute exacerbation of systolic congestive heart failure: - continue furosemide - continue spironolactone    LOS: [place], [place] 10/5/201610:01 AM

## 2015-06-13 NOTE — Progress Notes (Signed)
[facility] Progress Note Patient Name: Mackenzie Rose DOB: [birth year] MRN: [id number]   Date of Service  06/13/2015  HPI/Events of Note  Agitation and delirium. QTc interval = 450 milliseconds.   eICU Interventions  Will order: 1. Haldol 1 mg IV Q 6 hours PRN delirium. 2. Monitor QTc interval Q 6 hours. Notify MD if QTc interval > 500 milliseconds.      Intervention Category Major Interventions: Delirium, psychosis, severe agitation - evaluation and management  Sommer,Steven Eugene 06/13/2015, 4:34 PM

## 2015-06-13 NOTE — Progress Notes (Addendum)
[facility] Consultation     ASSESSMENT/PLAN   Acute respiratory failure secondary to volume overload from congestive heart failure as well as aspiration pneumonia with hiatal hernia. Patient was extubated on 06/12/2015. Course complicated by mental and psych issues.  PULMONARY  A: Acute hypoxic respiratory failure with pulmonary edema. Extubated yesterday. -Aspiration pneumonia with acute respiratory failure. Chest x-ray report and images from 06/13/2015 reviewed as well. As CT image and report report of the abdomen from number 08/28/2015. Together these images show evidence of a large hiatal hernia as well as intrathoracic stomach and the most recent chest x-ray images consistent with aspiration pneumonia. -Chronic hypercapnic respiratory failure with hypoventilation. -COPD -Nicotine abuse  P:   -Wean down FiO2 as tolerated, she is currently on a Venturi mask. Wean sats to oxygen saturation greater than 90%. -Continue Dulera. -Clindamycin was changed to levofloxacin today for anaerobic coverage and to reduce the risk of C. difficile infection. -Currently on nicotine patch.  CARDIOVASCULAR  A: Acute systolic congestive heart failure. Atrial fibrillation. P:  Continue diuresis per cardiology and nephrology recommendations.  RENAL A:  Acute kidney injury P:   Nephrology following  GASTROINTESTINAL A: History of GI bleed, on recent admission. P:   Continue the patient on Protonix. Monitor hemoglobins periodically. -. Recent GI bleed. Will decrease the patient's subcutaneous heparin from every 8 hours to every 12 hours.   HEMATOLOGIC A:  Chronic anemia P:    INFECTIOUS -  ENDOCRINE Blood sugars adequately controlled at this time.  NEUROLOGIC A:  Myasthenia Gravis. History of bipolar disorder P:   -Patient was on prednisone 30 mg every other day at home. We'll start the patient on IV steroids here. -Continue Mestinon.  MAJOR  EVENTS/TEST RESULTS: -Intubated 06/11/2015>>. Extubated 06/12/2015  INDWELLING DEVICES:: Urinary catheter placed 06/11/2015  MICRO DATA: MRSA PCR negative Urine - Blood- Resp -  DVT prophylaxis: Subcutaneous heparin. Gastric intestinal prophylaxis: Protonix, IV     ---------------------------------------  ---------------------------------------   Name: Mackenzie Rose MRN: [id number] DOB: 1945-10-21    ADMISSION DATE:  06/08/2015 CONSULTATION DATE: 06/12/2015  REFERRING MD :  Dr. Earleen Newport.   CHIEF COMPLAINT:  Dyspnea   HISTORY OF PRESENT ILLNESS:   The patient is currently extubated, she remains somewhat sedated but is arousable. She was on a Ventimask this morning and has been weaned to nasal cannula, she continues to have some central shallow respirations.  REVIEW OF SYSTEMS:   Patient is currently somewhat sedated and cannot provide a review of systems.   VITAL SIGNS: Temp:  [97.9 F (36.6 C)-98.5 F (36.9 C)] 98.5 F (36.9 C) (10/05 0000) Pulse Rate:  [58-81] 80 (10/05 0400) Resp:  [16-39] 39 (10/05 0400) BP: (52-143)/(34-72) 136/72 mmHg (10/05 0400) SpO2:  [83 %-99 %] 94 % (10/05 0400) FiO2 (%):  [40 %-55 %] 45 % (10/05 0351) Weight:  [65.2 kg (143 lb 11.8 oz)] 65.2 kg (143 lb 11.8 oz) (10/05 0500) HEMODYNAMICS:   VENTILATOR SETTINGS: Vent Mode:  [-]  FiO2 (%):  [40 %-55 %] 45 % INTAKE / OUTPUT:  Intake/Output Summary (Last 24 hours) at 06/13/15 1256 Last data filed at 06/13/15 0500  Gross per 24 hour  Intake    200 ml  Output   2000 ml  Net  -1800 ml    Physical Examination:   VS: BP 136/72 mmHg  Pulse 80  Temp(Src) 98.5 F (36.9 C) (Axillary)  Resp 39  Ht 5\' 4"  (1.626 m)  Wt 65.2 kg (143 lb 11.8  oz)  BMI 24.66 kg/m2  SpO2 94%  General Appearance: No distress  Neuro:without focal findings, mental status, sensation grossly normal  HEENT: PERRLA, EOM intact, no ptosis, no other lesions noticed;  Pulmonary: normal breath sounds.,  diaphragmatic excursion normal.   CardiovascularNormal S1,S2.  No m/r/g.    Abdomen: Benign, Soft, non-tender,  Renal:  No costovertebral tenderness  GU:  Not performed at this time. Endoc: No evident thyromegaly, no signs of acromegaly. Skin:   warm, no rashes, no ecchymosis  Extremities: normal, no cyanosis, clubbing,   LABS: Reviewed   LABORATORY PANEL:   CBC  Recent Labs Lab 06/13/15 0448  WBC 10.3  HGB 10.3*  HCT 33.8*  PLT 235    Chemistries   Recent Labs Lab 06/08/15 2310  06/09/15 0527  06/12/15 0522 06/13/15 0448  NA 135  --  143  < > 144 146*  K 6.2*  --  4.3  < > 3.8 3.7  CL 102  --  104  < > 104 101  CO2 25  --  31  < > 31 31  GLUCOSE 236*  --  113*  < > 88 103*  BUN 25*  --  25*  < > 36* 42*  CREATININE 2.15*  --  1.96*  < > 1.69* 1.71*  CALCIUM 8.8*  --  9.2  < > 8.6* 9.4  MG  --   --  2.0  --   --   --   PHOS  --   < > 4.5  --  2.8  --   AST 26  --   --   --   --   --   ALT 18  --   --   --   --   --   ALKPHOS 114  --   --   --   --   --   BILITOT 0.7  --   --   --   --   --   < > = values in this interval not displayed.   Recent Labs Lab 06/12/15 0027 06/12/15 1228 06/12/15 1553 06/13/15 0354 06/13/15 0710 06/13/15 1130  GLUCAP 138* 121* 90 119* 136* 154*    Recent Labs Lab 06/09/15 0003 06/11/15 2150 06/12/15 1455  PHART 7.46* 7.40 7.54*  PCO2ART 32 56* 39  PO2ART 141* 73* 56*    Recent Labs Lab 06/08/15 2310 06/12/15 0522  AST 26  --   ALT 18  --   ALKPHOS 114  --   BILITOT 0.7  --   ALBUMIN 3.2* 2.2*    Cardiac Enzymes  Recent Labs Lab 06/08/15 2310  TROPONINI 0.12*    RADIOLOGY:  Dg Chest 1 View  06/13/2015   CLINICAL DATA:  COPD, CHF, diabetes, myasthenia gravis, dementia.  EXAM: CHEST 1 VIEW  COMPARISON:  Portable chest x-ray of June 11, 2015.  FINDINGS: The lungs are adequately inflated. Bibasilar atelectasis or pneumonia persists. There is partial obscuration of the hemidiaphragms. The cardiac  silhouette is enlarged. There is mild central pulmonary vascular prominence. The trachea and esophagus have been extubated.  IMPRESSION: Slight interval improvement in the appearance of the pulmonary interstitium consistent with resolving interstitial edema. Persistent bibasilar atelectasis or pneumonia. A small left pleural effusion is present.   Electronically Signed   By: David  Martinique M.D.   On: 06/13/2015 07:25   US Renal  06/12/2015   CLINICAL DATA:  Kidney cysts, mass  EXAM: RENAL / URINARY TRACT ULTRASOUND COMPLETE  COMPARISON:  CT abdomen pelvis September 12 26  FINDINGS: Right Kidney:  Length: 12.2 cm. There is diffuse increased echotexture of the right kidney. There is a simple cyst in the upper pole right kidney measuring 3 x 2.9 x 2.9 cm. There is a second cyst in the lower pole right kidney measuring 3.1 x 2.4 x 2.8 cm. There is no hydronephrosis.  Left Kidney:  Length: 7.7 cm. There is diffuse increased echotexture. There is a cyst in the lower pole left kidney measuring 1.5 x 1 x 1.3 cm  Bladder:  The bladder is decompressed with Foley catheter in place.  IMPRESSION: Diffuse increased echotexture of bilateral kidneys consistent with medical renal disease.  Bilateral kidney cysts.  Atrophic left kidney.   Electronically Signed   By: Abelardo Diesel M.D.   On: 06/12/2015 19:02   Dg Chest Port 1 View  06/11/2015   CLINICAL DATA:  ICU patient. History of melanoma, diabetes and hypertension. Myasthenia gravis.  EXAM: PORTABLE CHEST 1 VIEW  COMPARISON:  06/08/2015 and 05/20/2015.  FINDINGS: 2009 hours. Endotracheal tube tip is in the midtrachea. A nasogastric tube projects below the diaphragm, tip not visualized. There are worsening bibasilar airspace opacities with probable bilateral pleural effusions, worrisome for aspiration pneumonia. The pulmonary vasculature remain somewhat indistinct, although improved from 3 days ago. There is no pneumothorax. The bones appear unchanged.  IMPRESSION: 1. The  endotracheal and nasogastric tubes appear in satisfactory position. 2. Worsening bibasilar airspace opacities worrisome for aspiration pneumonia   Electronically Signed   By: Richardean Sale M.D.   On: 06/11/2015 20:35       --Marda Stalker, MD.  Board Certified in Internal Medicine, Pulmonary Medicine, [place], and Sleep Medicine.   [facility]   Patricia Pesa, M.D.  Vilinda Boehringer, M.D.  Merton Border, M.D   06/13/2015, 12:56 PM  [place].  I have personally obtained a history, examined the patient, evaluated laboratory and imaging results, formulated the assessment and plan and placed orders. I have personally discussed and coordinated care of the patient with ICU nurse, pharmacist, nutrition services, case manager.  The Patient requires high complexity decision making for assessment and support, frequent evaluation and titration of therapies, application of advanced monitoring technologies and extensive interpretation of multiple databases. The patient has critical illness that could lead imminently to failure of 1 or more organ systems and requires the highest level of physician preparedness to intervene. Critical Care Time devoted to patient care services described in this note is 35 minutes and is exclusive of time spent in procedures.

## 2015-06-13 NOTE — Progress Notes (Signed)
PT Cancellation Note  Patient Details Name: Mackenzie Rose MRN: [id number] DOB: 07-12-1946   Cancelled Treatment:    Reason Eval/Treat Not Completed: Medical issues which prohibited therapy. PT reviewed chart and acknowledged events on 10/4. PT discussed mobility session, even bed level with RN. RN indicated "she can't even breathe". Patient receiving breathing treatment currently. PT will defer further treatment until later date/time.   Kerman Passey, PT, DPT    06/13/2015, 4:12 PM

## 2015-06-13 NOTE — Progress Notes (Signed)
ANTIBIOTIC CONSULT NOTE - INITIAL  Pharmacy Consult for Levaquin Indication: pneumonia  Allergies  Allergen Reactions  . Lactose Intolerance (Gi) Other (See Comments)    Reaction:  GI upset   . Lithium Other (See Comments)    Reaction:  Unknown   . Penicillins Rash    Patient Measurements: Height: 5\' 4"  (162.6 cm) Weight: 143 lb 11.8 oz (65.2 kg) IBW/kg (Calculated) : 54.7   Vital Signs: Temp: 98.5 F (36.9 C) (10/05 0000) Temp Source: Axillary (10/05 0000) BP: 136/72 mmHg (10/05 0400) Pulse Rate: 80 (10/05 0400) Intake/Output from previous day: 10/04 0701 - 10/05 0700 In: 200 [IV Piggyback:200] Out: 2000 [Urine:2000] Intake/Output from this shift:    Labs:  Recent Labs  06/11/15 0413 06/11/15 1652 06/12/15 0522 06/13/15 0448  WBC  --   --   --  10.3  HGB  --   --   --  10.3*  PLT  --   --   --  235  LABCREA  --  32  --   --   CREATININE 1.64*  --  1.69* 1.71*   Estimated Creatinine Clearance: 26.1 mL/min (by C-G formula based on Cr of 1.71). No results for input(s): VANCOTROUGH, VANCOPEAK, VANCORANDOM, GENTTROUGH, GENTPEAK, GENTRANDOM, TOBRATROUGH, TOBRAPEAK, TOBRARND, AMIKACINPEAK, AMIKACINTROU, AMIKACIN in the last 72 hours.   Microbiology: Recent Results (from the past 720 hour(s))  Urine culture     Status: None   Collection Time: 05/20/15  9:23 PM  Result Value Ref Range Status   Specimen Description URINE, RANDOM  Final   Special Requests NONE  Final   Culture MULTIPLE SPECIES PRESENT, SUGGEST RECOLLECTION  Final   Report Status 05/22/2015 FINAL  Final  MRSA PCR Screening     Status: None   Collection Time: 05/21/15  3:53 AM  Result Value Ref Range Status   MRSA by PCR NEGATIVE NEGATIVE Final    Comment:        The GeneXpert MRSA Assay (FDA approved for NASAL specimens only), is one component of a comprehensive MRSA colonization surveillance program. It is not intended to diagnose MRSA infection nor to guide or monitor treatment for MRSA  infections.   MRSA PCR Screening     Status: None   Collection Time: 06/09/15  5:26 AM  Result Value Ref Range Status   MRSA by PCR NEGATIVE NEGATIVE Final    Comment:        The GeneXpert MRSA Assay (FDA approved for NASAL specimens only), is one component of a comprehensive MRSA colonization surveillance program. It is not intended to diagnose MRSA infection nor to guide or monitor treatment for MRSA infections.     Medical History: Past Medical History  Diagnosis Date  . Heart disease   . Atrial fibrillation ([place])     a. reported a-fib; b. unknown chronicity; c. dates back to 1970s; d. not on long term anticoagulation 2/2 no documented a-fib  . Hypertension   . High cholesterol   . Diabetes ([place])     borderline  . COPD (chronic obstructive pulmonary disease) ([place])   . Melanoma ([place])   . Kidney disorder   . Tremor   . Autoimmune disease ([place])   . Depressed   . Anxiety disorder   . Migraine   . Dementia   . Motion sickness   . Myasthenia gravis ([place])   . Bipolar disorder ([place])   . Dementia   . Acute renal failure ([place])   . Near syncope  Medications:  Scheduled:  . antiseptic oral rinse  7 mL Mouth Rinse QID  . busPIRone  10 mg Oral BID  . carvedilol  3.125 mg Oral BID WC  . chlorhexidine gluconate  15 mL Mouth Rinse BID  . diltiazem  180 mg Oral Daily  . donepezil  10 mg Oral Daily  . fentaNYL (SUBLIMAZE) injection  50 mcg Intravenous Once  . furosemide  80 mg Intravenous Q12H  . heparin  5,000 Units Subcutaneous Q12H  . hydrALAZINE  50 mg Oral TID  . insulin aspart  0-9 Units Subcutaneous TID WC  . lamoTRIgine  100 mg Oral BID  . latanoprost  1 drop Both Eyes QHS  . levofloxacin (LEVAQUIN) IV  750 mg Intravenous Q48H  . mometasone-formoterol  2 puff Inhalation BID  . nicotine  7 mg Transdermal Daily  . nystatin   Topical TID  . pantoprazole  40 mg Oral BID AC  . potassium chloride  20 mEq Oral BID  . pyridostigmine  60 mg Oral TID  . sodium  chloride  3 mL Intravenous Q12H  . spironolactone  25 mg Oral Daily  . traZODone  50 mg Oral QHS   Infusions:   PRN: albuterol, ALPRAZolam, fentaNYL (SUBLIMAZE) injection, guaiFENesin, ipratropium-albuterol, midazolam, midazolam, ondansetron **OR** ondansetron (ZOFRAN) IV  Assessment: 69 y/o F with possible aspiration pneumonia on clindamycin to change abx to Levaquin.   Goal of Therapy:  Resolution of infection  Plan:  Will begin Levaquin 750 mg iv q 24 hours and f/u renal function.   Ulice Dash D 06/13/2015,11:46 AM

## 2015-06-13 NOTE — Progress Notes (Addendum)
Initial Nutrition Assessment     INTERVENTION:   Medical Food Supplement Therapy: if po intake inadequate on follow-up, recommend addition of nutritional supplement   NUTRITION DIAGNOSIS:   Inadequate oral intake related to acute illness as evidenced by NPO status.  GOAL:   Patient will meet greater than or equal to 90% of their needs  MONITOR:    (Energy Intake, Anthropometrics, Digestive System, glucose Profile)  REASON FOR ASSESSMENT:   Malnutrition Screening Tool, Diagnosis    ASSESSMENT:   Pt admitted with SOB secondary to acute CHF. Pt s/p extubation yesterday   Diet Order:  Diet 2 gram sodium Room service appropriate?: Yes; Fluid consistency:: Thin   Energy Intake: pt did not tolerate liquids during the night with RN, SLP eval pending for swallow evaluation, meal trays on hold  Electrolyte and Renal Profile:  Recent Labs Lab 06/09/15 0527  06/11/15 0413 06/12/15 0522 06/13/15 0448  BUN 25*  < > 32* 36* 42*  CREATININE 1.96*  < > 1.64* 1.69* 1.71*  NA 143  < > 139 144 146*  K 4.3  < > 3.5 3.8 3.7  MG 2.0  --   --   --   --   PHOS 4.5  --   --  2.8  --   < > = values in this interval not displayed. Glucose Profile:   Recent Labs  06/13/15 0354 06/13/15 0710 06/13/15 1130  GLUCAP 119* 136* 154*   Meds: lasix, ss novolog  Last BM:  10/4  Height:   Ht Readings from Last 1 Encounters:  06/11/15 5\' 4"  (1.626 m)    Weight:   Wt Readings from Last 1 Encounters:  06/13/15 143 lb 11.8 oz (65.2 kg)    Filed Weights   06/10/15 0507 06/11/15 0633 06/13/15 0500  Weight: 142 lb 8 oz (64.638 kg) 144 lb 6.4 oz (65.499 kg) 143 lb 11.8 oz (65.2 kg)    BMI:  Body mass index is 24.66 kg/(m^2).  Estimated Nutritional Needs:   Kcal:  2641-5830 kcals (BEE 1152, 1.3 AF, 1.1-1.3 IF)   Protein:  68-81 g (1.1-1.3 g/kg)  Fluid:  1375-1650 mL (25-30 ml/kg)   EDUCATION NEEDS:   No education needs identified at this time  [place], [place], LDN [phone] Pager

## 2015-06-13 NOTE — Evaluation (Signed)
Clinical/Bedside Swallow Evaluation Patient Details  Name: Mackenzie Rose MRN: [id number] Date of Birth: 1945/09/21  Today's Date: 06/13/2015 Time: SLP Start Time (ACUTE ONLY): 1135 SLP Stop Time (ACUTE ONLY): 1235 SLP Time Calculation (min) (ACUTE ONLY): 60 min  Past Medical History:  Past Medical History  Diagnosis Date  . Heart disease   . Atrial fibrillation ([place])     a. reported a-fib; b. unknown chronicity; c. dates back to 1970s; d. not on long term anticoagulation 2/2 no documented a-fib  . Hypertension   . High cholesterol   . Diabetes ([place])     borderline  . COPD (chronic obstructive pulmonary disease) ([place])   . Melanoma ([place])   . Kidney disorder   . Tremor   . Autoimmune disease ([place])   . Depressed   . Anxiety disorder   . Migraine   . Dementia   . Motion sickness   . Myasthenia gravis ([place])   . Bipolar disorder ([place])   . Dementia   . Acute renal failure ([place])   . Near syncope    Past Surgical History:  Past Surgical History  Procedure Laterality Date  . Tubal ligation    . Retena repair    . Cataract extraction    . Cholecystectomy    . Esophagogastroduodenoscopy (egd) with propofol N/A 05/22/2015    Procedure: ESOPHAGOGASTRODUODENOSCOPY (EGD) WITH PROPOFOL;  Surgeon: Lucilla Lame, MD;  Location: [facility] ENDOSCOPY;  Service: Endoscopy;  Laterality: N/A;   HPI:  Mackenzie Rose is a 69 y.o. female who presents with shortness of breath, worsening over the last 3-4 days. Patient states that she's also had some intermittent chest pains throughout the same time. She states that she's had some episodes of acute CHF in the past, but denies being chronically followed for CHF. She does have a history of A. fib, but no overt history of CAD. Evaluation in the ED today, she was short of breath requiring administration of BiPAP. BNP significantly elevated to 2500, and chest x-ray consistent with congestive heart failure. Troponin was also elevated to 0.12. She has had an  elevated troponin the past, was evaluated by cardiology who felt that her troponin was likely demand ischemia. Of note, she also has COPD likely diminishing her baseline respiratory reserve. Hospitalists were called for admission for acute CHF   Assessment / Plan / Recommendation Clinical Impression  Pt presents with significant oral phase deficits c/b poor labial seal, poor oral awareness, oral spillage, talking while bolus in mouth, residue, holding, poor AP transit and poor lingual movement. Based on assessed oral phase deficits, suspect pharyngeal deficits present as well (although not directly observed with outward overt symptoms). Pt is at increased risk of aspiration due to declined cognitive status at this time.  Strict aspiration precautions should be taken when giving HONEY thick liquid by tsp.  ST recommend starting with honey thick by tsp at this time as Pt may benefit from increased texture (and in turn hopefully increase pt awareness of bolus). NSG and MD updated.    Aspiration Risk  Moderate (based on cognitive status)    Diet Recommendation Dysphagia 1 (Puree);Honey (with trials to upgrade when appropriate)   Medication Administration: Crushed with puree (as able) Compensations: Slow rate;Small sips/bites;Minimize environmental distractions;Check for pocketing;Check for anterior loss    Other  Recommendations Oral Care Recommendations: Oral care before and after PO;Oral care QID;Staff/trained caregiver to provide oral care   Follow Up Recommendations       Frequency and Duration min 3x  week      Pertinent Vitals/Pain None reported, but said she "couldn't breathe" (attending MD and NSG in room during this time).    SLP Swallow Goals   See plan of care.   Swallow Study Prior Functional Status    Pt lives in a nursing home and has baseline dementia and anxiety (noted in chart).    General Date of Onset: 06/08/15 Other Pertinent Information: Mackenzie Rose is a 69 y.o.  female who presents with shortness of breath, worsening over the last 3-4 days. Patient states that she's also had some intermittent chest pains throughout the same time. She states that she's had some episodes of acute CHF in the past, but denies being chronically followed for CHF. She does have a history of A. fib, but no overt history of CAD. Evaluation in the ED today, she was short of breath requiring administration of BiPAP. BNP significantly elevated to 2500, and chest x-ray consistent with congestive heart failure. Troponin was also elevated to 0.12. She has had an elevated troponin the past, was evaluated by cardiology who felt that her troponin was likely demand ischemia. Of note, she also has COPD likely diminishing her baseline respiratory reserve. Hospitalists were called for admission for acute CHF Type of Study: Bedside swallow evaluation Diet Prior to this Study:  (unknown) Temperature Spikes Noted: No Respiratory Status: Supplemental O2 delivered via (comment) ([place] 6 liters) History of Recent Intubation: No Behavior/Cognition: Alert;Confused;Agitated;Requires cueing;Doesn't follow directions;Distractible;Uncooperative Self-Feeding Abilities: Needs set up;Total assist Patient Positioning: Upright in bed Baseline Vocal Quality: Aphonic;Low vocal intensity Volitional Cough: Cognitively unable to elicit Volitional Swallow: Unable to elicit    Oral/Motor/Sensory Function Overall Oral Motor/Sensory Function: Impaired (overall weakness noted in all tasks; unsure of baseline)   Ice Chips Ice chips: Impaired Presentation: Spoon Oral Phase Impairments: Reduced labial seal;Reduced lingual movement/coordination;Impaired anterior to posterior transit;Impaired mastication;Poor awareness of bolus Oral Phase Functional Implications: Oral holding;Prolonged oral transit Other Comments: Pt exhibited significant oral phase dysphagia c/b poor awareness of bolus, oral spillage (anterior), holding,  talking, poor AP transit.   Thin Liquid Thin Liquid: Impaired Presentation: Cup Oral Phase Impairments: Reduced labial seal;Reduced lingual movement/coordination;Impaired anterior to posterior transit;Poor awareness of bolus Oral Phase Functional Implications: Right anterior spillage;Left anterior spillage Other Comments: Pt appeared to have poor awareness as she spilled all liquid from her mouth and spilled on her chest (poor labial seal, poor awareness).    Nectar Thick Nectar Thick Liquid: Not tested   Honey Thick Honey Thick Liquid: Not tested   Puree Puree: Impaired Presentation: Spoon Oral Phase Impairments: Reduced labial seal;Reduced lingual movement/coordination;Impaired mastication;Impaired anterior to posterior transit;Poor awareness of bolus Oral Phase Functional Implications: Right anterior spillage;Left anterior spillage;Oral residue;Oral holding;Prolonged oral transit   Solid   GO    Solid: Not tested       Mackenzie Rose 06/13/2015,1:44 PM

## 2015-06-13 NOTE — Progress Notes (Signed)
Speech Therapy Note: Reviewed chart notes this PM indicating that pt has a large Hiatal Hernia per MD note. This can greatly increase risk for Esophageal dysmotility and regurgitation of po material which can then be aspirated. This dx along w/ pt's decline respiratory status greatly increases risk for dysphagia and aspiration. NSG consulted re: need for strict aspiration precautions w/ any po's. Rec.'d holding po's if pt's respiratory status declines any further. NSG agreed. ST will f/u tomorrow.

## 2015-06-13 NOTE — Progress Notes (Signed)
   06/13/15 1030  Clinical Encounter Type  Visited With Patient;Health care provider  Visit Type Spiritual support  Referral From Patient  Consult/Referral To Chaplain  Spiritual Encounters  Spiritual Needs Emotional;Other (Comment)  Stress Factors  Patient Stress Factors Other (Comment)  Chaplain rounded in the unit and was summoned into the patient's room by the patient. She wanted me to get her a cup of ice and water. The nurse stated she had something for the patient and that she had a consult in to ensure that she could handle the liquids. I encouraged the patient and she asked me to keep her in prayer. I told her I would and that I would return to check in with her later. The nurse was preparing to give her something to drink. Chaplain Kian Ottaviano A. Eular Panek Ext. [phone]

## 2015-06-14 ENCOUNTER — Inpatient Hospital Stay: Payer: Medicare Other

## 2015-06-14 DIAGNOSIS — Z01818 Encounter for other preprocedural examination: Secondary | ICD-10-CM

## 2015-06-14 DIAGNOSIS — R0600 Dyspnea, unspecified: Secondary | ICD-10-CM

## 2015-06-14 DIAGNOSIS — R0602 Shortness of breath: Secondary | ICD-10-CM

## 2015-06-14 LAB — CBC
HCT: 34.1 % — ABNORMAL LOW (ref 35.0–47.0)
Hemoglobin: 10.6 g/dL — ABNORMAL LOW (ref 12.0–16.0)
MCH: 25.6 pg — ABNORMAL LOW (ref 26.0–34.0)
MCHC: 31 g/dL — AB (ref 32.0–36.0)
MCV: 82.5 fL (ref 80.0–100.0)
PLATELETS: 271 10*3/uL (ref 150–440)
RBC: 4.13 MIL/uL (ref 3.80–5.20)
RDW: 28.9 % — AB (ref 11.5–14.5)
WBC: 10.3 10*3/uL (ref 3.6–11.0)

## 2015-06-14 LAB — BASIC METABOLIC PANEL
Anion gap: 14 (ref 5–15)
BUN: 44 mg/dL — AB (ref 6–20)
CALCIUM: 9.8 mg/dL (ref 8.9–10.3)
CO2: 35 mmol/L — ABNORMAL HIGH (ref 22–32)
CREATININE: 1.78 mg/dL — AB (ref 0.44–1.00)
Chloride: 100 mmol/L — ABNORMAL LOW (ref 101–111)
GFR calc non Af Amer: 28 mL/min — ABNORMAL LOW (ref >60)
GFR, EST AFRICAN AMERICAN: 32 mL/min — AB (ref >60)
Glucose, Bld: 153 mg/dL — ABNORMAL HIGH (ref 65–99)
Potassium: 3.7 mmol/L (ref 3.5–5.1)
SODIUM: 149 mmol/L — AB (ref 135–145)

## 2015-06-14 LAB — BLOOD GAS, ARTERIAL
ALLENS TEST (PASS/FAIL): POSITIVE — AB
Acid-Base Excess: 14.6 mmol/L — ABNORMAL HIGH (ref 0.0–3.0)
Bicarbonate: 39.9 mEq/L — ABNORMAL HIGH (ref 21.0–28.0)
FIO2: 50
O2 Saturation: 95.4 %
PCO2 ART: 50 mmHg — AB (ref 32.0–48.0)
PEEP: 5 cmH2O
PH ART: 7.51 — AB (ref 7.350–7.450)
Patient temperature: 37
RATE: 18 resp/min
VT: 450 mL
pO2, Arterial: 70 mmHg — ABNORMAL LOW (ref 83.0–108.0)

## 2015-06-14 LAB — GLUCOSE, CAPILLARY
GLUCOSE-CAPILLARY: 174 mg/dL — AB (ref 65–99)
GLUCOSE-CAPILLARY: 117 mg/dL — AB (ref 65–99)
GLUCOSE-CAPILLARY: 145 mg/dL — AB (ref 65–99)
GLUCOSE-CAPILLARY: 104 mg/dL — AB (ref 65–99)

## 2015-06-14 LAB — TRIGLYCERIDES
Triglycerides: 201 mg/dL — ABNORMAL HIGH (ref <150)
Triglycerides: 214 mg/dL — ABNORMAL HIGH (ref <150)

## 2015-06-14 MED ORDER — SODIUM CHLORIDE 0.9 % IV SOLN
INTRAVENOUS | Status: DC
  Administered 2015-06-14: 16:00:00 via INTRAVENOUS

## 2015-06-14 MED ORDER — FUROSEMIDE 10 MG/ML IJ SOLN
20.0000 mg | Freq: Once | INTRAMUSCULAR | Status: AC
  Administered 2015-06-14: 20 mg via INTRAVENOUS
  Filled 2015-06-14: qty 2

## 2015-06-14 MED ORDER — PROPOFOL 1000 MG/100ML IV EMUL
INTRAVENOUS | Status: AC
  Administered 2015-06-14: 2 mL via INTRAVENOUS
  Filled 2015-06-14: qty 100

## 2015-06-14 MED ORDER — DEXTROSE 5 % IV SOLN: 0.0000 ug/min | INTRAVENOUS | Status: DC

## 2015-06-14 MED ORDER — ETOMIDATE 2 MG/ML IV SOLN
20.0000 mg | Freq: Once | INTRAVENOUS | Status: AC
  Administered 2015-06-14: 15 mg via INTRAVENOUS
  Filled 2015-06-14: qty 10

## 2015-06-14 MED ORDER — FREE WATER
25.0000 mL | Status: DC
  Administered 2015-06-14 – 2015-06-15 (×5): 25 mL

## 2015-06-14 MED ORDER — PROPOFOL 1000 MG/100ML IV EMUL
0.0000 ug/kg/min | INTRAVENOUS | Status: DC
  Administered 2015-06-14 (×2): 30 ug/kg/min via INTRAVENOUS
  Administered 2015-06-15: 20 ug/kg/min via INTRAVENOUS
  Administered 2015-06-15: 6.5 ug/kg/min via INTRAVENOUS
  Administered 2015-06-15: 40 ug/kg/min via INTRAVENOUS
  Administered 2015-06-16: 14.967 ug/kg/min via INTRAVENOUS
  Administered 2015-06-17: 10 ug/kg/min via INTRAVENOUS
  Administered 2015-06-17: 5 ug/kg/min via INTRAVENOUS
  Filled 2015-06-14 (×7): qty 100

## 2015-06-14 MED ORDER — LAMOTRIGINE 100 MG PO TABS
100.0000 mg | ORAL_TABLET | Freq: Two times a day (BID) | ORAL | Status: DC
  Administered 2015-06-14 – 2015-06-18 (×8): 100 mg
  Filled 2015-06-14 (×8): qty 1

## 2015-06-14 MED ORDER — SPIRONOLACTONE 25 MG PO TABS
25.0000 mg | ORAL_TABLET | Freq: Every day | ORAL | Status: DC
  Administered 2015-06-15 – 2015-06-18 (×4): 25 mg
  Filled 2015-06-14 (×4): qty 1

## 2015-06-14 MED ORDER — VITAL HIGH PROTEIN PO LIQD
1000.0000 mL | ORAL | Status: DC
  Administered 2015-06-14 – 2015-06-17 (×5): 1000 mL
  Administered 2015-06-18: 10:00:00

## 2015-06-14 MED ORDER — HYDRALAZINE HCL 50 MG PO TABS
50.0000 mg | ORAL_TABLET | Freq: Three times a day (TID) | ORAL | Status: DC
Start: 2015-06-14 — End: 2015-06-18
  Administered 2015-06-14 – 2015-06-18 (×10): 50 mg
  Filled 2015-06-14 (×10): qty 1

## 2015-06-14 MED ORDER — POTASSIUM CHLORIDE 20 MEQ PO PACK
20.0000 meq | PACK | Freq: Two times a day (BID) | ORAL | Status: DC
  Administered 2015-06-14 – 2015-06-16 (×5): 20 meq
  Filled 2015-06-14 (×6): qty 1

## 2015-06-14 MED ORDER — DEXMEDETOMIDINE HCL IN NACL 400 MCG/100ML IV SOLN
0.4000 ug/kg/h | INTRAVENOUS | Status: DC
  Administered 2015-06-14: 0.4 ug/kg/h via INTRAVENOUS
  Administered 2015-06-14: 0.505 ug/kg/h via INTRAVENOUS
  Administered 2015-06-14: 0.804 ug/kg/h via INTRAVENOUS
  Filled 2015-06-14: qty 100

## 2015-06-14 MED ORDER — VANCOMYCIN HCL IN DEXTROSE 1-5 GM/200ML-% IV SOLN
1000.0000 mg | Freq: Once | INTRAVENOUS | Status: AC
  Administered 2015-06-14: 1000 mg via INTRAVENOUS
  Filled 2015-06-14: qty 200

## 2015-06-14 MED ORDER — NOREPINEPHRINE 4 MG/250ML-% IV SOLN: 0.0000 ug/min | INTRAVENOUS | Status: DC

## 2015-06-14 MED ORDER — SENNA 8.6 MG PO TABS: 1.0000 | ORAL_TABLET | Freq: Two times a day (BID) | ORAL | Status: DC | PRN

## 2015-06-14 MED ORDER — VITAL HIGH PROTEIN PO LIQD: 1000.0000 mL | ORAL | Status: DC

## 2015-06-14 MED ORDER — DIPHENHYDRAMINE HCL 50 MG/ML IJ SOLN
25.0000 mg | Freq: Once | INTRAMUSCULAR | Status: AC
  Administered 2015-06-14: 25 mg via INTRAVENOUS
  Filled 2015-06-14: qty 1

## 2015-06-14 MED ORDER — PROPOFOL 1000 MG/100ML IV EMUL
100.0000 mg | INTRAVENOUS | Status: AC
  Administered 2015-06-14: 2 mL via INTRAVENOUS

## 2015-06-14 MED ORDER — CARVEDILOL 3.125 MG PO TABS
3.1250 mg | ORAL_TABLET | Freq: Two times a day (BID) | ORAL | Status: DC
  Administered 2015-06-14 – 2015-06-27 (×24): 3.125 mg
  Filled 2015-06-14 (×25): qty 1

## 2015-06-14 MED ORDER — PANTOPRAZOLE SODIUM 40 MG PO PACK
40.0000 mg | PACK | Freq: Two times a day (BID) | ORAL | Status: DC
  Administered 2015-06-14 – 2015-06-18 (×8): 40 mg
  Filled 2015-06-14 (×8): qty 20

## 2015-06-14 MED ORDER — PYRIDOSTIGMINE BROMIDE 60 MG PO TABS
60.0000 mg | ORAL_TABLET | Freq: Three times a day (TID) | ORAL | Status: DC
  Administered 2015-06-14 – 2015-06-18 (×12): 60 mg
  Filled 2015-06-14 (×12): qty 1

## 2015-06-14 MED ORDER — AMPICILLIN-SULBACTAM SODIUM 1.5 (1-0.5) G IJ SOLR
1.5000 g | Freq: Two times a day (BID) | INTRAMUSCULAR | Status: DC
  Administered 2015-06-14 – 2015-06-19 (×11): 1.5 g via INTRAVENOUS
  Filled 2015-06-14 (×12): qty 1.5

## 2015-06-14 MED ORDER — VANCOMYCIN HCL IN DEXTROSE 750-5 MG/150ML-% IV SOLN
750.0000 mg | INTRAVENOUS | Status: DC
Start: 2015-06-14 — End: 2015-06-17
  Administered 2015-06-14 – 2015-06-16 (×3): 750 mg via INTRAVENOUS
  Filled 2015-06-14 (×4): qty 150

## 2015-06-14 NOTE — Progress Notes (Signed)
Speech Therapy Note: reviewed chart notes indicating pt has been reintubated sec. to decline in respiratory status. ST will f/u once pt is extubated.

## 2015-06-14 NOTE — Progress Notes (Signed)
Pt was able to sleep for 2 hours but when she woke up, she desated into the 70's, 80's.  Pt woke up with a junky cough, was able to cough up some sputum, thick, blood tinged.  Respiratory was called, venti mask now at 55%.  Dr. Lavetta Nielsen was paged and came to examine pt.  Ordered 20mg  Lasix one time, and one time order of benadryl 25IV.  Pt's sats now at 99% and still complains of not being able to breathe.

## 2015-06-14 NOTE — Progress Notes (Signed)
[facility]  ROUNDING NOTE   Subjective:   Placed on precedex.  UOP 2975. Furosemide 49m IV q12 Creatinine 1.78 (1.71)   Objective:  Vital signs in last 24 hours:  Temp:  [96.5 F (35.8 C)-98.6 F (37 C)] 98.1 F (36.7 C) (10/06 0800) Pulse Rate:  [83-100] 100 (10/06 0800) Resp:  [17-32] 28 (10/06 0800) BP: (99-169)/(56-120) 169/89 mmHg (10/06 0800) SpO2:  [79 %-100 %] 94 % (10/06 0800) FiO2 (%):  [40 %-55 %] 40 % (10/06 0800) Weight:  [65.7 kg (144 lb 13.5 oz)] 65.7 kg (144 lb 13.5 oz) (10/06 0500)  Weight change: 0.5 kg (1 lb 1.6 oz) Filed Weights   06/11/15 0633 06/13/15 0500 06/14/15 0500  Weight: 65.499 kg (144 lb 6.4 oz) 65.2 kg (143 lb 11.8 oz) 65.7 kg (144 lb 13.5 oz)    Intake/Output: I/O last 3 completed shifts: In: 200 [IV Piggyback:200] Out: 40867[Urine:4625]   Intake/Output this shift:     Physical Exam: General: Respiratory distress  Head: Normocephalic, atraumatic.  Eyes: Anicteric, PERRL  Neck: Supple, trachea midline  Lungs:  Bilateral crackles, on venturi  Heart: Regular rate and rhythm  Abdomen:  Soft, nontender  Extremities: Trace - 1+ peripheral edema.  Neurologic: Nonfocal, moving all four extremities  Skin: No lesions  GU Foley with urine    Basic Metabolic Panel:  Recent Labs Lab 06/09/15 0527 06/10/15 0501 06/11/15 0413 06/12/15 0522 06/13/15 0448 06/14/15 0409  NA 143 141 139 144 146* 149*  K 4.3 3.3* 3.5 3.8 3.7 3.7  CL 104 100* 101 104 101 100*  CO2 31 33* 31 31 31  35*  GLUCOSE 113* 149* 116* 88 103* 153*  BUN 25* 33* 32* 36* 42* 44*  CREATININE 1.96* 1.98* 1.64* 1.69* 1.71* 1.78*  CALCIUM 9.2 8.6* 8.5* 8.6* 9.4 9.8  MG 2.0  --   --   --   --   --   PHOS 4.5  --   --  2.8  --   --     Liver Function Tests:  Recent Labs Lab 06/08/15 2310 06/12/15 0522  AST 26  --   ALT 18  --   ALKPHOS 114  --   BILITOT 0.7  --   PROT 6.1*  --   ALBUMIN 3.2* 2.2*   No results for input(s): LIPASE, AMYLASE  in the last 168 hours. No results for input(s): AMMONIA in the last 168 hours.  CBC:  Recent Labs Lab 06/08/15 2310 06/09/15 0527 06/13/15 0448 06/14/15 0409  WBC 9.4 6.4 10.3 10.3  HGB 10.1* 9.8* 10.3* 10.6*  HCT 33.4* 31.0* 33.8* 34.1*  MCV 83.0 81.1 82.8 82.5  PLT 259 252 235 271    Cardiac Enzymes:  Recent Labs Lab 06/08/15 2310  TROPONINI 0.12*    BNP: Invalid input(s): POCBNP  CBG:  Recent Labs Lab 06/13/15 0710 06/13/15 1130 06/13/15 1703 06/13/15 2119 06/14/15 0715  GLUCAP 136* 154* 175* 141* 174*    Microbiology: Results for orders placed or performed during the hospital encounter of 06/08/15  MRSA PCR Screening     Status: None   Collection Time: 06/09/15  5:26 AM  Result Value Ref Range Status   MRSA by PCR NEGATIVE NEGATIVE Final    Comment:        The GeneXpert MRSA Assay (FDA approved for NASAL specimens only), is one component of a comprehensive MRSA colonization surveillance program. It is not intended to diagnose MRSA infection nor to guide or monitor treatment  for MRSA infections.     Coagulation Studies: No results for input(s): LABPROT, INR in the last 72 hours.  Urinalysis:  Recent Labs  06/11/15 1652  COLORURINE STRAW*  LABSPEC 1.008  PHURINE 6.0  GLUCOSEU NEGATIVE  HGBUR NEGATIVE  BILIRUBINUR NEGATIVE  KETONESUR NEGATIVE  PROTEINUR 100*  NITRITE NEGATIVE  LEUKOCYTESUR NEGATIVE      Imaging: Dg Chest 1 View  06/14/2015   CLINICAL DATA:  Shortness of breath.  EXAM: CHEST 1 VIEW  COMPARISON:  06/13/2015.  FINDINGS: Mediastinum hilar structures are normal. Persistent cardiomegaly. Persistent basilar infiltrates consistent with bibasilar pneumonia and/or pulmonary edema. No significant change. Small bilateral pleural effusions. No pneumothorax.  IMPRESSION: 1. Persistent cardiomegaly. 2. Persistent bibasilar infiltrates and/or pulmonary edema without significant interim change. Small persistent bilateral effusions.    Electronically Signed   By: Marcello Moores  Register   On: 06/14/2015 07:30   Dg Chest 1 View  06/13/2015   CLINICAL DATA:  COPD, CHF, diabetes, myasthenia gravis, dementia.  EXAM: CHEST 1 VIEW  COMPARISON:  Portable chest x-ray of June 11, 2015.  FINDINGS: The lungs are adequately inflated. Bibasilar atelectasis or pneumonia persists. There is partial obscuration of the hemidiaphragms. The cardiac silhouette is enlarged. There is mild central pulmonary vascular prominence. The trachea and esophagus have been extubated.  IMPRESSION: Slight interval improvement in the appearance of the pulmonary interstitium consistent with resolving interstitial edema. Persistent bibasilar atelectasis or pneumonia. A small left pleural effusion is present.   Electronically Signed   By: David  Martinique M.D.   On: 06/13/2015 07:25   US Renal  06/12/2015   CLINICAL DATA:  Kidney cysts, mass  EXAM: RENAL / URINARY TRACT ULTRASOUND COMPLETE  COMPARISON:  CT abdomen pelvis September 12 26  FINDINGS: Right Kidney:  Length: 12.2 cm. There is diffuse increased echotexture of the right kidney. There is a simple cyst in the upper pole right kidney measuring 3 x 2.9 x 2.9 cm. There is a second cyst in the lower pole right kidney measuring 3.1 x 2.4 x 2.8 cm. There is no hydronephrosis.  Left Kidney:  Length: 7.7 cm. There is diffuse increased echotexture. There is a cyst in the lower pole left kidney measuring 1.5 x 1 x 1.3 cm  Bladder:  The bladder is decompressed with Foley catheter in place.  IMPRESSION: Diffuse increased echotexture of bilateral kidneys consistent with medical renal disease.  Bilateral kidney cysts.  Atrophic left kidney.   Electronically Signed   By: Abelardo Diesel M.D.   On: 06/12/2015 19:02     Medications:   . dexmedetomidine 0.804 mcg/kg/hr (06/14/15 0942)   . antiseptic oral rinse  7 mL Mouth Rinse QID  . busPIRone  10 mg Oral BID  . carvedilol  3.125 mg Oral BID WC  . chlorhexidine gluconate  15 mL Mouth  Rinse BID  . diltiazem  180 mg Oral Daily  . donepezil  10 mg Oral Daily  . fentaNYL (SUBLIMAZE) injection  50 mcg Intravenous Once  . furosemide  80 mg Intravenous Q12H  . heparin  5,000 Units Subcutaneous Q12H  . hydrALAZINE  50 mg Oral TID  . insulin aspart  0-9 Units Subcutaneous TID WC  . lamoTRIgine  100 mg Oral BID  . latanoprost  1 drop Both Eyes QHS  . levofloxacin (LEVAQUIN) IV  750 mg Intravenous Q48H  . mometasone-formoterol  2 puff Inhalation BID  . nicotine  7 mg Transdermal Daily  . nystatin   Topical TID  . pantoprazole  40 mg Oral BID AC  . potassium chloride  20 mEq Oral BID  . pyridostigmine  60 mg Oral TID  . sodium chloride  3 mL Intravenous Q12H  . spironolactone  25 mg Oral Daily  . traZODone  50 mg Oral QHS   albuterol, ALPRAZolam, fentaNYL (SUBLIMAZE) injection, guaiFENesin, haloperidol lactate, ipratropium-albuterol, midazolam, midazolam, ondansetron **OR** ondansetron (ZOFRAN) IV  Assessment/ Plan:  [name] is a 69 y.o. white female with coronary artery disease, atrial fibrillation, hypertension, hyperlipidemia, diabetes mellitus type II, COPD, tremor, systolic congestive heart failure, depression, anxiety, dementia, bipolar disease who was admitted on 06/08/2015 for Hyperkalemia [E87.5] Acute renal insufficiency [N28.9] Acute congestive heart failure, unspecified congestive heart failure type ([place]) [I50.9]  1. Acute Renal Failure with hyperkalemia on chronic kidney disease stage III with proteinuria: Potassium now at goal. Admitted with creatinine of 2.15, now trending downward to creatinine of 1.78. Baseline creatinine of 1.19, eGFR of 45. Acute renal failure secondary to acute cardiopulmonary syndrome exacerbated by acute exacerbation of systolic congestive heart failure.  Chronic Kidney Disease with proteinuria secondary to diabetic nephropathy and renal artery stenosis. Left kidney atrophy on CT 05/21/2015.  - Will need outpatient renal  dopplers.  - Renally dose all medications  2. Renal mass: right: seen on CT. Ultrasound found these to be simple cysts.   3. Acute exacerbation of systolic congestive heart failure: continue to monitor volume status - continue furosemide - continue spironolactone    LOS: 5 Faustina Gebert 10/6/201610:01 AM

## 2015-06-14 NOTE — Care Management Note (Signed)
Case Management Note  Patient Details  Name: Mackenzie Rose MRN: [id number] Date of Birth: 1946/03/31  Subjective/Objective:    LTACH on hold at this time. May be appropriate in the future. Patient continues to have difficulty breathing with agitation.                 Action/Plan: SNF vs LTACH  Expected Discharge Date:                  Expected Discharge Plan:     In-House Referral:  Clinical Social Work  Discharge planning Services  CM Consult  Post Acute Care Choice:    Choice offered to:     DME Arranged:    DME Agency:     HH Arranged:    HH Agency:     Status of Service:  In process, will continue to follow  Medicare Important Message Given:  Yes-second notification given Date Medicare IM Given:    Medicare IM give by:    Date Additional Medicare IM Given:    Additional Medicare Important Message give by:     If discussed at [place] of Stay Meetings, dates discussed:    Additional Comments:  Jolly Mango, RN 06/14/2015, 8:02 AM

## 2015-06-14 NOTE — Progress Notes (Signed)
[facility] [place] NAME: Mackenzie Rose    MR#:  [id number]  DATE OF BIRTH:  05/03/1946  SUBJECTIVE:   extubated 10/4 and on 6 liter venti mask. Pt with eyes closed constantly repeating ' I cannot breathe" -fixated on the thought that she cannot breathe sats >92%-94% on 6 liter    Review of Systems  Constitutional: Negative for fever, chills and weight loss.  HENT: Negative for ear discharge, ear pain and nosebleeds.   Eyes: Negative for blurred vision, pain and discharge.  Respiratory: Positive for cough and shortness of breath. Negative for sputum production, wheezing and stridor.   Cardiovascular: Negative for chest pain, palpitations, orthopnea and PND.  Gastrointestinal: Negative for nausea, vomiting, abdominal pain and diarrhea.  Genitourinary: Negative for urgency and frequency.  Musculoskeletal: Positive for joint pain. Negative for back pain.  Skin: Positive for rash.  Neurological: Positive for weakness. Negative for sensory change, speech change and focal weakness.  Psychiatric/Behavioral: Negative for depression and hallucinations. The patient is not nervous/anxious.   All other systems reviewed and are negative.  Tolerating Diet:npo Tolerating PT: eval pending  DRUG ALLERGIES:   Allergies  Allergen Reactions  . Lactose Intolerance (Gi) Other (See Comments)    Reaction:  GI upset   . Lithium Other (See Comments)    Reaction:  Unknown   . Penicillins Rash    VITALS:  Blood pressure 169/89, pulse 100, temperature 98.1 F (36.7 C), temperature source Oral, resp. rate 28, height 5\' 4"  (1.626 m), weight 65.7 kg (144 lb 13.5 oz), SpO2 94 %.  PHYSICAL EXAMINATION:   Physical Exam  GENERAL:  69 y.o.-year-old patient lying in the bed with no acute distress. Critically ill EYES: Pupils equal, round, reactive to light and accommodation. No scleral icterus. Extraocular muscles intact.  HEENT: Head atraumatic,  normocephalic. Oropharynx and nasopharynx clear. Vent it mask NECK:  Supple, no jugular venous distention. No thyroid enlargement, no tenderness.  LUNGS: decreased breath sounds bilaterally, no wheezing, + rales, no rhonchi. No use of accessory muscles of respiration. On venti mask CARDIOVASCULAR: S1, S2 normal. No murmurs, rubs, or gallops.  ABDOMEN: Soft, nontender, nondistended. Bowel sounds present. No organomegaly or mass.  EXTREMITIES: No cyanosis, clubbing  ++ edema b/l.    NEUROLOGIC: unable to assess completely due to resp distress PSYCHIATRIC:unable to assess but anxious SKIN: right groin/thigh fungal rash lesion-improved remarkably   LABORATORY PANEL:   CBC  Recent Labs Lab 06/14/15 0409  WBC 10.3  HGB 10.6*  HCT 34.1*  PLT 271    Chemistries   Recent Labs Lab 06/08/15 2310 06/09/15 0527  06/14/15 0409  NA 135 143  < > 149*  K 6.2* 4.3  < > 3.7  CL 102 104  < > 100*  CO2 25 31  < > 35*  GLUCOSE 236* 113*  < > 153*  BUN 25* 25*  < > 44*  CREATININE 2.15* 1.96*  < > 1.78*  CALCIUM 8.8* 9.2  < > 9.8  MG  --  2.0  --   --   AST 26  --   --   --   ALT 18  --   --   --   ALKPHOS 114  --   --   --   BILITOT 0.7  --   --   --   < > = values in this interval not displayed.  Cardiac Enzymes  Recent Labs Lab 06/08/15 2310  TROPONINI 0.12*    RADIOLOGY:  Dg Chest 1 View  06/14/2015   CLINICAL DATA:  Shortness of breath.  EXAM: CHEST 1 VIEW  COMPARISON:  06/13/2015.  FINDINGS: Mediastinum hilar structures are normal. Persistent cardiomegaly. Persistent basilar infiltrates consistent with bibasilar pneumonia and/or pulmonary edema. No significant change. Small bilateral pleural effusions. No pneumothorax.  IMPRESSION: 1. Persistent cardiomegaly. 2. Persistent bibasilar infiltrates and/or pulmonary edema without significant interim change. Small persistent bilateral effusions.   Electronically Signed   By: Marcello Moores  Register   On: 06/14/2015 07:30   Dg Chest 1  View  06/13/2015   CLINICAL DATA:  COPD, CHF, diabetes, myasthenia gravis, dementia.  EXAM: CHEST 1 VIEW  COMPARISON:  Portable chest x-ray of June 11, 2015.  FINDINGS: The lungs are adequately inflated. Bibasilar atelectasis or pneumonia persists. There is partial obscuration of the hemidiaphragms. The cardiac silhouette is enlarged. There is mild central pulmonary vascular prominence. The trachea and esophagus have been extubated.  IMPRESSION: Slight interval improvement in the appearance of the pulmonary interstitium consistent with resolving interstitial edema. Persistent bibasilar atelectasis or pneumonia. A small left pleural effusion is present.   Electronically Signed   By: David  Martinique M.D.   On: 06/13/2015 07:25   US Renal  06/12/2015   CLINICAL DATA:  Kidney cysts, mass  EXAM: RENAL / URINARY TRACT ULTRASOUND COMPLETE  COMPARISON:  CT abdomen pelvis September 12 26  FINDINGS: Right Kidney:  Length: 12.2 cm. There is diffuse increased echotexture of the right kidney. There is a simple cyst in the upper pole right kidney measuring 3 x 2.9 x 2.9 cm. There is a second cyst in the lower pole right kidney measuring 3.1 x 2.4 x 2.8 cm. There is no hydronephrosis.  Left Kidney:  Length: 7.7 cm. There is diffuse increased echotexture. There is a cyst in the lower pole left kidney measuring 1.5 x 1 x 1.3 cm  Bladder:  The bladder is decompressed with Foley catheter in place.  IMPRESSION: Diffuse increased echotexture of bilateral kidneys consistent with medical renal disease.  Bilateral kidney cysts.  Atrophic left kidney.   Electronically Signed   By: Abelardo Diesel M.D.   On: 06/12/2015 19:02     ASSESSMENT AND PLAN:  69 y.o. female who presents with shortness of breath, worsening over the last 3-4 days. Patient states that she's also had some intermittent chest pains throughout the same time. She states that she's had some episodes of acute CHF in the past, but denies being chronically followed for  CHF  1. acute hypoxic respiratory failure secondary to  Acute systolic CHF (congestive heart failure) and Aspiration PNA -Patient transferred to ICU given impending respiratory failure got intubated and on the ventilator October 3-->extubated. On venti mask. May need to get intubated. Pt pulling mask. -changed lasix to IV 80 mg bid --->UOP 2900 cc over 24 hours -echo shows EF 35% (was 55-60% on July 2016) -cardiology consult with dr Clayborn Bigness noted -added coreg. Cont hydralazine and diltiazem -no ace due to CKD stage III -creat 1.94-->1.64-->1,71 -was on IV clindamycin--->changed to IV levaquin and vanc (october 6th)  2. Acute on Chronic RF (acute renal failure) CKD-III -diuresing cautiously this time. -Appreciate nephrology consultation  Baseline creat 1.15. -Renal ultrasound bilateral kidney cysts. Avoid nephrotoxins  3. Myasthenia gravis - continue home meds for this  4.HTN (hypertension) - elevated here, continue home antihypertensives, diuresis may help with this some.   5.Type 2 diabetes mellitus - sliding scale   6.COPD (chronic  obstructive pulmonary disease) - continue home inhalers, when necessary DuoNeb's  7.h/o chronic afib -on diltiazem. No anticoagulation due to falls  8.Bipolar affective disorder, mixed - continue home meds  9. Right groin skin rash appears candidial Nystatin powder and po fluconazolex1 -does not have celluliitis. Rest shows much improvement.  Palliative consult placed Management plans discussed with the patient and  in agreement.  CODE STATUS:FULL  DVT Prophylaxis: lovenox  TOTAL TIME critical Spent in CARE OF THIS PATIENT: 40 minutes.  >50% time spent on counselling and coordination of care with ICU attending, RN Spoke with son about pt's respiratory condition and the need for re-intubation. Son is in [place] and unable to come till next week. He is ok for pt to be re-intubated if needed  Garrick Midgley M.D on 06/14/2015 at 9:03 AM  Between 7am  to 6pm - Pager - [phone]  After 6pm go to [email or web address] - password [email or web address] [facility]  Office  [phone]  CC: Primary care physician; Donato Schultz, MD

## 2015-06-14 NOTE — Progress Notes (Signed)
ANTIBIOTIC CONSULT NOTE - INITIAL  Pharmacy Consult for Unasyn/Vancomycin Indication: pneumonia  Allergies  Allergen Reactions  . Lactose Intolerance (Gi) Other (See Comments)    Reaction:  GI upset   . Lithium Other (See Comments)    Reaction:  Unknown   . Penicillins Rash    Patient Measurements: Height: 5\' 4"  (162.6 cm) Weight: 144 lb 13.5 oz (65.7 kg) IBW/kg (Calculated) : 54.7   Vital Signs: Temp: 98.1 F (36.7 C) (10/06 0800) Temp Source: Oral (10/06 0800) BP: 169/89 mmHg (10/06 0800) Pulse Rate: 100 (10/06 0800) Intake/Output from previous day: 10/05 0701 - 10/06 0700 In: 150 [IV Piggyback:150] Out: 2975 [Urine:2975] Intake/Output from this shift:    Labs:  Recent Labs  06/11/15 1652 06/12/15 0522 06/13/15 0448 06/14/15 0409  WBC  --   --  10.3 10.3  HGB  --   --  10.3* 10.6*  PLT  --   --  235 271  LABCREA 32  --   --   --   CREATININE  --  1.69* 1.71* 1.78*   Estimated Creatinine Clearance: 27 mL/min (by C-G formula based on Cr of 1.78). No results for input(s): VANCOTROUGH, VANCOPEAK, VANCORANDOM, GENTTROUGH, GENTPEAK, GENTRANDOM, TOBRATROUGH, TOBRAPEAK, TOBRARND, AMIKACINPEAK, AMIKACINTROU, AMIKACIN in the last 72 hours.   Microbiology: Recent Results (from the past 720 hour(s))  Urine culture     Status: None   Collection Time: 05/20/15  9:23 PM  Result Value Ref Range Status   Specimen Description URINE, RANDOM  Final   Special Requests NONE  Final   Culture MULTIPLE SPECIES PRESENT, SUGGEST RECOLLECTION  Final   Report Status 05/22/2015 FINAL  Final  MRSA PCR Screening     Status: None   Collection Time: 05/21/15  3:53 AM  Result Value Ref Range Status   MRSA by PCR NEGATIVE NEGATIVE Final    Comment:        The GeneXpert MRSA Assay (FDA approved for NASAL specimens only), is one component of a comprehensive MRSA colonization surveillance program. It is not intended to diagnose MRSA infection nor to guide or monitor treatment  for MRSA infections.   MRSA PCR Screening     Status: None   Collection Time: 06/09/15  5:26 AM  Result Value Ref Range Status   MRSA by PCR NEGATIVE NEGATIVE Final    Comment:        The GeneXpert MRSA Assay (FDA approved for NASAL specimens only), is one component of a comprehensive MRSA colonization surveillance program. It is not intended to diagnose MRSA infection nor to guide or monitor treatment for MRSA infections.     Medical History: Past Medical History  Diagnosis Date  . Heart disease   . Atrial fibrillation ([place])     a. reported a-fib; b. unknown chronicity; c. dates back to 1970s; d. not on long term anticoagulation 2/2 no documented a-fib  . Hypertension   . High cholesterol   . Diabetes ([place])     borderline  . COPD (chronic obstructive pulmonary disease) ([place])   . Melanoma ([place])   . Kidney disorder   . Tremor   . Autoimmune disease ([place])   . Depressed   . Anxiety disorder   . Migraine   . Dementia   . Motion sickness   . Myasthenia gravis ([place])   . Bipolar disorder ([place])   . Dementia   . Acute renal failure ([place])   . Near syncope     Medications:  Scheduled:  . ampicillin-sulbactam (  UNASYN) IV  1.5 g Intravenous Q12H  . antiseptic oral rinse  7 mL Mouth Rinse QID  . carvedilol  3.125 mg Per Tube BID WC  . chlorhexidine gluconate  15 mL Mouth Rinse BID  . [START ON 06/15/2015] feeding supplement (VITAL HIGH PROTEIN)  1,000 mL Per Tube Q24H  . fentaNYL (SUBLIMAZE) injection  50 mcg Intravenous Once  . free water  25 mL Per Tube Q4H  . furosemide  80 mg Intravenous Q12H  . heparin  5,000 Units Subcutaneous Q12H  . hydrALAZINE  50 mg Per Tube TID  . insulin aspart  0-9 Units Subcutaneous TID WC  . lamoTRIgine  100 mg Per Tube BID  . latanoprost  1 drop Both Eyes QHS  . nystatin   Topical TID  . pantoprazole sodium  40 mg Per Tube BID  . potassium chloride  20 mEq Per Tube BID  . propofol  100 mg Intravenous STAT  . pyridostigmine  60 mg Per  Tube TID  . sodium chloride  3 mL Intravenous Q12H  . [START ON 06/15/2015] spironolactone  25 mg Per Tube Daily  . vancomycin  1,000 mg Intravenous Once  . vancomycin  750 mg Intravenous Q24H   Infusions:  . norepinephrine    . propofol (DIPRIVAN) infusion     PRN: albuterol, fentaNYL (SUBLIMAZE) injection, guaiFENesin, ipratropium-albuterol, ondansetron **OR** ondansetron (ZOFRAN) IV, senna  Assessment:    Pharmacy consulted to dose vancomycin and Unasyn for 69 yo female re-intubated previously ordered levofloxacin.   Goal of Therapy:  Resolution of infection  Plan:  1. Unasyn: Will start patient on Unasyn 1.5g IV Q12hr. Patient has listed allergy to PCN of rash. Spoke with RN and asked that she monitor for signs and symptoms.   2. Vancomycin: Patient ordered vancomycin 1g IV x 1. Will continue patient on vancomycin 750mg  IV Q24hr for goal trough of 15-20. Will order stacked dose to be given at 2330 tonight. Will obtain trough prior to dose on 10/9.    Pharmacy will continue to monitor and adjust per consult.     Azelia Reiger L 06/14/2015,2:32 PM

## 2015-06-14 NOTE — Progress Notes (Signed)
[facility] Consultation     ASSESSMENT/PLAN   Acute respiratory failure secondary to volume overload from congestive heart failure as well as aspiration pneumonia with hiatal hernia. Patient was extubated on 06/12/2015. Course complicated by myasthenia gravis, mental and psych issues. Reintubated 06/14/2015 due to aspiration pneumonia  with worsening respiratory status, poor secretion clearance, mental status impairment.  PULMONARY  A: Acute hypoxic respiratory failure with pulmonary edema. Extubated yesterday. Now reintubated due to aspiration pneumonia. -Aspiration pneumonia with acute respiratory failure. Now ventilator dependent. Chest x-ray report and images from 06/13/2015 reviewed as well. As CT image and report report of the abdomen from number 08/28/2015. Together these images show evidence of a large hiatal hernia as well as intrathoracic stomach and the most recent chest x-ray images consistent with aspiration pneumonia. -Chronic hypercapnic respiratory failure with hypoventilation. -COPD -Nicotine abuse  P:   -Wean down FiO2 as tolerated, she is currently on a Venturi mask. Wean sats to oxygen saturation greater than 90%. -Continue Dulera. -Clindamycin was changed to levofloxacin today for anaerobic coverage and to reduce the risk of C. difficile infection. Will add vancomycin IV. -Currently on nicotine patch.  CARDIOVASCULAR  A: Acute systolic congestive heart failure. Atrial fibrillation. P:  Continue diuresis per cardiology and nephrology recommendations.  RENAL A:  Acute kidney injury P:   Nephrology following  GASTROINTESTINAL A: History of GI bleed, on recent admission. P:   Continue the patient on Protonix. Monitor hemoglobins periodically. -. Recent GI bleed. Will decrease the patient's subcutaneous heparin from every 8 hours to every 12 hours.   HEMATOLOGIC A:  Chronic anemia P:    INFECTIOUS -Pneumonia MICRO DATA: MRSA  PCR negative Urine - Blood- Resp -  ENDOCRINE Blood sugars adequately controlled at this time.  NEUROLOGIC A:  Myasthenia Gravis. History of bipolar disorder P:   -Patient was on prednisone 30 mg every other day at home. We'll start the patient on IV steroids here. -Continue Mestinon.  MAJOR EVENTS/TEST RESULTS: -Intubated 06/11/2015>>. Extubated 06/12/2015  INDWELLING DEVICES:: Urinary catheter placed 06/11/2015    DVT prophylaxis: Subcutaneous heparin. Gastric intestinal prophylaxis: Protonix, IV     ---------------------------------------  ---------------------------------------   Name: Mackenzie Rose MRN: [id number] DOB: 03-Nov-1945    ADMISSION DATE:  06/08/2015 CONSULTATION DATE: 06/12/2015  REFERRING MD :  Dr. Earleen Newport.   CHIEF COMPLAINT:  Dyspnea   HISTORY OF PRESENT ILLNESS:   Patient was maintained overnight on a 40% Ventimask. Her oxygen saturations maintained above 90%, the patient was not sleeping and continuously moans throughout the night, stating that she cannot breathe. During rounds, the patient's sats noted to drop to 80% on the mask. Her breathing became worse. The patient was switched to a nonrebreather and it was decided that the patient should be intubated at that time.  Endotracheal Intubation: Patient required placement of an artificial airway secondary to resp failure.   Consent: Emergent.   Hand washing performed prior to starting the procedure.   Medications administered for sedation prior to procedure:  20 mg of propofol, 15 mg of etomidate.  Procedure: A time out procedure was called and correct patient, name, & ID confirmed. Needed supplies and equipment were assembled and checked to include ETT, 10 ml syringe, Glidescope, Mac and Miller blades, suction, oxygen and bag mask valve, end tidal CO2 monitor. Patient was positioned to align the mouth and pharynx to facilitate visualization of the glottis.  Heart rate, SpO2 and blood  pressure was continuously monitored during the procedure. Pre-oxygenation was  conducted prior to intubation and endotracheal tube was placed through the vocal cords into the trachea.    The artificial airway was placed under direct visualization via glidescope route using a  ETT on the first attempt.    ETT was secured at 23 cm mark.    Placement was confirmed by auscuitation of lungs with good breath sounds bilaterally and no stomach sounds.  Condensation was noted on endotracheal tube.  Pulse ox 95 %.  CO2 detector in place with appropriate color change.   Complications: None .   Operator: Ashby Dawes  Chest radiograph ordered and pending.     Marda Stalker, M.D.       REVIEW OF SYSTEMS:   Patient is currently somewhat sedated and cannot provide a review of systems she is critically ill and on the ventilator.    VITAL SIGNS: Temp:  [96.5 F (35.8 C)-98.6 F (37 C)] 98.1 F (36.7 C) (10/06 0800) Pulse Rate:  [83-100] 100 (10/06 0800) Resp:  [17-32] 28 (10/06 0800) BP: (99-169)/(56-120) 169/89 mmHg (10/06 0800) SpO2:  [79 %-100 %] 94 % (10/06 0800) FiO2 (%):  [40 %-55 %] 40 % (10/06 0800) Weight:  [65.7 kg (144 lb 13.5 oz)] 65.7 kg (144 lb 13.5 oz) (10/06 0500) HEMODYNAMICS:   VENTILATOR SETTINGS: Vent Mode:  [-]  FiO2 (%):  [40 %-55 %] 40 % INTAKE / OUTPUT:  Intake/Output Summary (Last 24 hours) at 06/14/15 1108 Last data filed at 06/14/15 0851  Gross per 24 hour  Intake    150 ml  Output   2975 ml  Net  -2825 ml    Physical Examination:   VS: BP 169/89 mmHg  Pulse 100  Temp(Src) 98.1 F (36.7 C) (Oral)  Resp 28  Ht 5\' 4"  (1.626 m)  Wt 65.7 kg (144 lb 13.5 oz)  BMI 24.85 kg/m2  SpO2 94%  General Appearance: No distress  Neuro:without focal findings, mental status reduced , sensation grossly normal  HEENT: PERRLA, EOM intact, no ptosis, no other lesions noticed;  Pulmonary: normal breath sounds., diaphragmatic excursion normal.     CardiovascularNormal S1,S2.  No m/r/g.    Abdomen: Benign, Soft, non-tender,  Renal:  No costovertebral tenderness  GU:  Not performed at this time. Endoc: No evident thyromegaly, no signs of acromegaly. Skin:   warm, no rashes, no ecchymosis  Extremities: normal, no cyanosis, clubbing,   LABS: Reviewed   LABORATORY PANEL:   CBC  Recent Labs Lab 06/14/15 0409  WBC 10.3  HGB 10.6*  HCT 34.1*  PLT 271    Chemistries   Recent Labs Lab 06/08/15 2310  06/09/15 0527  06/12/15 0522  06/14/15 0409  NA 135  --  143  < > 144  < > 149*  K 6.2*  --  4.3  < > 3.8  < > 3.7  CL 102  --  104  < > 104  < > 100*  CO2 25  --  31  < > 31  < > 35*  GLUCOSE 236*  --  113*  < > 88  < > 153*  BUN 25*  --  25*  < > 36*  < > 44*  CREATININE 2.15*  --  1.96*  < > 1.69*  < > 1.78*  CALCIUM 8.8*  --  9.2  < > 8.6*  < > 9.8  MG  --   --  2.0  --   --   --   --   PHOS  --   < >  4.5  --  2.8  --   --   AST 26  --   --   --   --   --   --   ALT 18  --   --   --   --   --   --   ALKPHOS 114  --   --   --   --   --   --   BILITOT 0.7  --   --   --   --   --   --   < > = values in this interval not displayed.   Recent Labs Lab 06/13/15 0354 06/13/15 0710 06/13/15 1130 06/13/15 1703 06/13/15 2119 06/14/15 0715  GLUCAP 119* 136* 154* 175* 141* 174*    Recent Labs Lab 06/12/15 1455 06/13/15 1345 06/13/15 1825  PHART 7.54* 7.45 7.48*  PCO2ART 39 50* 48  PO2ART 56* 51* 64*    Recent Labs Lab 06/08/15 2310 06/12/15 0522  AST 26  --   ALT 18  --   ALKPHOS 114  --   BILITOT 0.7  --   ALBUMIN 3.2* 2.2*    Cardiac Enzymes  Recent Labs Lab 06/08/15 2310  TROPONINI 0.12*    RADIOLOGY:  Dg Chest 1 View  06/14/2015   CLINICAL DATA:  Shortness of breath.  EXAM: CHEST 1 VIEW  COMPARISON:  06/13/2015.  FINDINGS: Mediastinum hilar structures are normal. Persistent cardiomegaly. Persistent basilar infiltrates consistent with bibasilar pneumonia and/or pulmonary edema. No  significant change. Small bilateral pleural effusions. No pneumothorax.  IMPRESSION: 1. Persistent cardiomegaly. 2. Persistent bibasilar infiltrates and/or pulmonary edema without significant interim change. Small persistent bilateral effusions.   Electronically Signed   By: Marcello Moores  Register   On: 06/14/2015 07:30   Dg Chest 1 View  06/13/2015   CLINICAL DATA:  COPD, CHF, diabetes, myasthenia gravis, dementia.  EXAM: CHEST 1 VIEW  COMPARISON:  Portable chest x-ray of June 11, 2015.  FINDINGS: The lungs are adequately inflated. Bibasilar atelectasis or pneumonia persists. There is partial obscuration of the hemidiaphragms. The cardiac silhouette is enlarged. There is mild central pulmonary vascular prominence. The trachea and esophagus have been extubated.  IMPRESSION: Slight interval improvement in the appearance of the pulmonary interstitium consistent with resolving interstitial edema. Persistent bibasilar atelectasis or pneumonia. A small left pleural effusion is present.   Electronically Signed   By: David  Martinique M.D.   On: 06/13/2015 07:25   US Renal  06/12/2015   CLINICAL DATA:  Kidney cysts, mass  EXAM: RENAL / URINARY TRACT ULTRASOUND COMPLETE  COMPARISON:  CT abdomen pelvis September 12 26  FINDINGS: Right Kidney:  Length: 12.2 cm. There is diffuse increased echotexture of the right kidney. There is a simple cyst in the upper pole right kidney measuring 3 x 2.9 x 2.9 cm. There is a second cyst in the lower pole right kidney measuring 3.1 x 2.4 x 2.8 cm. There is no hydronephrosis.  Left Kidney:  Length: 7.7 cm. There is diffuse increased echotexture. There is a cyst in the lower pole left kidney measuring 1.5 x 1 x 1.3 cm  Bladder:  The bladder is decompressed with Foley catheter in place.  IMPRESSION: Diffuse increased echotexture of bilateral kidneys consistent with medical renal disease.  Bilateral kidney cysts.  Atrophic left kidney.   Electronically Signed   By: Abelardo Diesel M.D.   On:  06/12/2015 19:02       --Deep Ashby Dawes, MD.  Board Certified in Internal Medicine,  Pulmonary Medicine, Critical Care Medicine, and Sleep Medicine.   [facility]   Patricia Pesa, M.D.  Vilinda Boehringer, M.D.  Merton Border, M.D  [place].  I have personally obtained a history, examined the patient, evaluated laboratory and imaging results, formulated the assessment and plan and placed orders. I have personally discussed and coordinated care of the patient with ICU nurse, pharmacist, nutrition services, case manager.  The Patient requires high complexity decision making for assessment and support, frequent evaluation and titration of therapies, application of advanced monitoring technologies and extensive interpretation of multiple databases. The patient has critical illness that could lead imminently to failure of 1 or more organ systems and requires the highest level of physician preparedness to intervene.  Critical Care Time devoted to patient care services described in this note is 50 minutes and is exclusive of time spent in procedures.    06/14/2015, 11:08 AM

## 2015-06-14 NOTE — Progress Notes (Signed)
Nutrition Follow-up     INTERVENTION:  EN: Consulted for enteral nutrition.  Recommend vital high protein at goal rate of 4ml/hr (without diprivan). Will provide 1200 kcals, 105 g of protein and 1043ml free water.     NUTRITION DIAGNOSIS:   Inadequate oral intake related to acute illness as evidenced by NPO status.    GOAL:   Patient will meet greater than or equal to 90% of their needs    MONITOR:    (Energy intake, Pulmonary profile, Digestive system)  REASON FOR ASSESSMENT:   Consult Enteral/tube feeding initiation and management  ASSESSMENT:   Pt re-intubated this am.   Current Nutrition: NPO   Gastrointestinal Profile: Last BM: 10/4   Medications: lasix, aspart, KCL,senna, diprivan 3.45ml/hr currently to provide 102 kcals in next 24 hr  Electrolyte/Renal Profile and Glucose Profile:   Recent Labs Lab 06/09/15 0527  06/12/15 0522 06/13/15 0448 06/14/15 0409  NA 143  < > 144 146* 149*  K 4.3  < > 3.8 3.7 3.7  CL 104  < > 104 101 100*  CO2 31  < > 31 31 35*  BUN 25*  < > 36* 42* 44*  CREATININE 1.96*  < > 1.69* 1.71* 1.78*  CALCIUM 9.2  < > 8.6* 9.4 9.8  MG 2.0  --   --   --   --   PHOS 4.5  --  2.8  --   --   GLUCOSE 113*  < > 88 103* 153*  < > = values in this interval not displayed. Protein Profile:  Recent Labs Lab 06/08/15 2310 06/12/15 0522  ALBUMIN 3.2* 2.2*     Weight Trend since Admission: Filed Weights   06/11/15 0633 06/13/15 0500 06/14/15 0500  Weight: 144 lb 6.4 oz (65.499 kg) 143 lb 11.8 oz (65.2 kg) 144 lb 13.5 oz (65.7 kg)     Diet Order:   NPO  Skin:   reviewed   Height:   Ht Readings from Last 1 Encounters:  06/11/15 5\' 4"  (1.626 m)    Weight:   Wt Readings from Last 1 Encounters:  06/14/15 144 lb 13.5 oz (65.7 kg)         BMI:  Body mass index is 24.85 kg/(m^2).  Estimated Nutritional Needs:   Kcal:  1319 kcals/d  (Ve 8, Tmax 37)  Protein:  (1.2-2.0 g/kg) 78-130 g/d  Fluid:  25-69ml/kg)  1625-1966ml/d  EDUCATION NEEDS:   No education needs identified at this time  HIGH Care Level  Mackenzie Rose, [place], [place] (pager)

## 2015-06-14 NOTE — Progress Notes (Signed)
Pt was given haldol one time.  Pt was very agitated, pulling off mask. Mittens were applied in order for pt. To not be able to pull venti mask off.  Pt sats now at 96% on 55% venti mask.  Pt still complains of not being able to breathe.

## 2015-06-14 NOTE — Care Management Note (Signed)
Case Management Note  Patient Details  Name: Sevannah Madia MRN: [id number] Date of Birth: 1945/12/03  Subjective/Objective:   Re- intubated this morning due to acute respiratory failure secondary to aspiration PNA.                  Action/Plan:   Expected Discharge Date:                  Expected Discharge Plan:     In-House Referral:  Clinical Social Work  Discharge planning Services  CM Consult  Post Acute Care Choice:    Choice offered to:     DME Arranged:    DME Agency:     HH Arranged:    HH Agency:     Status of Service:  In process, will continue to follow  Medicare Important Message Given:  Yes-second notification given Date Medicare IM Given:    Medicare IM give by:    Date Additional Medicare IM Given:    Additional Medicare Important Message give by:     If discussed at [place] of Stay Meetings, dates discussed:    Additional Comments:  Jolly Mango, RN 06/14/2015, 11:30 AM

## 2015-06-14 NOTE — Clinical Social Work Note (Signed)
Clinical Social Work Assessment  Patient Details  Name: Mackenzie Rose MRN: [id number] Date of Birth: 1946/05/01  Date of referral:  06/14/15               Reason for consult:  Facility Placement                Permission sought to share information with:    Permission granted to share information::     Name::        Agency::     Relationship::     Contact Information:     Housing/Transportation Living arrangements for the past 2 months:  [place] of Information:  Parent Patient Interpreter Needed:  None Criminal Activity/Legal Involvement Pertinent to Current Situation/Hospitalization:  No - Comment as needed Significant Relationships:  Adult Children Lives with:  Facility Resident Do you feel safe going back to the place where you live?    Need for family participation in patient care:     Care giving concerns:  Patient resides at [facility]   Social Worker assessment / plan:  Patient has had a difficult course since admission to hospital. She has been intubated, extubated, and now reintubated as of today. Patient's son is her contact: Carter Kitten: [phone]. CSW contacted him via phone today and he does wish for patient to return to [facility] if possible at discharge.   Employment status:  Retired Forensic scientist:  Medicare PT Recommendations:  Not assessed at this time Information / Referral to community resources:     Patient/Family's Response to care:  Patient's son expressed appreciation for CSW assistance.  Patient/Family's Understanding of and Emotional Response to Diagnosis, Current Treatment, and Prognosis:  Patient's son is currently wanting aggressive treatment.  Emotional Assessment Appearance:  Appears stated age Attitude/Demeanor/Rapport:  Unable to Assess Affect (typically observed):  Unable to Assess Orientation:    Alcohol / Substance use:  Not Applicable Psych involvement (Current and /or in the community):  No  (Comment)  Discharge Needs  Concerns to be addressed:  Care Coordination Readmission within the last 30 days:  No Current discharge risk:  None Barriers to Discharge:  No Barriers Identified   Shela Leff, LCSW 06/14/2015, 3:02 PM

## 2015-06-14 NOTE — Progress Notes (Signed)
Patient was reintubated by ICU intensivist Dr. Juanell Fairly. She was exhibiting symptoms of impending respiratory failure secondary to ongoing shortness of breath use of accessory muscles pulling out Ventimask and is unable to hold up her secretions. She was reintubated today and placed on IV propofol. Palliative consultation has been placed. Spoke with son Mr. Ayoub. And updated. Son would like pt to be Full code for now. He will likely be in town week after next. . Critical time 25 minutes

## 2015-06-15 ENCOUNTER — Inpatient Hospital Stay: Payer: Medicare Other

## 2015-06-15 LAB — BLOOD GAS, ARTERIAL
Acid-Base Excess: 16.7 mmol/L — ABNORMAL HIGH (ref 0.0–3.0)
BICARBONATE: 40.3 meq/L — AB (ref 21.0–28.0)
FIO2: 0.4
MECHVT: 450 mL
Mechanical Rate: 18
O2 SAT: 94.8 %
PATIENT TEMPERATURE: 37
PEEP: 5 cmH2O
PO2 ART: 62 mmHg — AB (ref 83.0–108.0)
pCO2 arterial: 42 mmHg (ref 32.0–48.0)
pH, Arterial: 7.59 — ABNORMAL HIGH (ref 7.350–7.450)

## 2015-06-15 LAB — CBC
HEMATOCRIT: 30.3 % — AB (ref 35.0–47.0)
Hemoglobin: 9.2 g/dL — ABNORMAL LOW (ref 12.0–16.0)
MCH: 24.8 pg — ABNORMAL LOW (ref 26.0–34.0)
MCHC: 30.3 g/dL — ABNORMAL LOW (ref 32.0–36.0)
MCV: 81.8 fL (ref 80.0–100.0)
Platelets: 216 10*3/uL (ref 150–440)
RBC: 3.7 MIL/uL — ABNORMAL LOW (ref 3.80–5.20)
RDW: 28.5 % — AB (ref 11.5–14.5)
WBC: 11.3 10*3/uL — AB (ref 3.6–11.0)

## 2015-06-15 LAB — BASIC METABOLIC PANEL
ANION GAP: 9 (ref 5–15)
BUN: 52 mg/dL — ABNORMAL HIGH (ref 6–20)
CALCIUM: 9 mg/dL (ref 8.9–10.3)
CHLORIDE: 105 mmol/L (ref 101–111)
CO2: 37 mmol/L — AB (ref 22–32)
Creatinine, Ser: 1.73 mg/dL — ABNORMAL HIGH (ref 0.44–1.00)
GFR calc Af Amer: 33 mL/min — ABNORMAL LOW (ref >60)
GFR calc non Af Amer: 29 mL/min — ABNORMAL LOW (ref >60)
GLUCOSE: 147 mg/dL — AB (ref 65–99)
Potassium: 3.4 mmol/L — ABNORMAL LOW (ref 3.5–5.1)
Sodium: 151 mmol/L — ABNORMAL HIGH (ref 135–145)

## 2015-06-15 LAB — GLUCOSE, CAPILLARY
GLUCOSE-CAPILLARY: 138 mg/dL — AB (ref 65–99)
GLUCOSE-CAPILLARY: 129 mg/dL — AB (ref 65–99)
GLUCOSE-CAPILLARY: 180 mg/dL — AB (ref 65–99)
Glucose-Capillary: 109 mg/dL — ABNORMAL HIGH (ref 65–99)
Glucose-Capillary: 136 mg/dL — ABNORMAL HIGH (ref 65–99)
Glucose-Capillary: 150 mg/dL — ABNORMAL HIGH (ref 65–99)

## 2015-06-15 MED ORDER — FREE WATER
50.0000 mL | Status: DC
  Administered 2015-06-15 – 2015-06-16 (×6): 50 mL

## 2015-06-15 MED ORDER — INSULIN ASPART 100 UNIT/ML ~~LOC~~ SOLN
0.0000 [IU] | SUBCUTANEOUS | Status: DC
  Administered 2015-06-15 (×3): 1 [IU] via SUBCUTANEOUS
  Administered 2015-06-15 – 2015-06-16 (×2): 2 [IU] via SUBCUTANEOUS
  Administered 2015-06-16: 5 [IU] via SUBCUTANEOUS
  Administered 2015-06-16 (×2): 1 [IU] via SUBCUTANEOUS
  Administered 2015-06-16 – 2015-06-17 (×4): 5 [IU] via SUBCUTANEOUS
  Administered 2015-06-17: 7 [IU] via SUBCUTANEOUS
  Administered 2015-06-17 – 2015-06-18 (×3): 5 [IU] via SUBCUTANEOUS
  Administered 2015-06-18: 3 [IU] via SUBCUTANEOUS
  Administered 2015-06-18: 1 [IU] via SUBCUTANEOUS
  Administered 2015-06-18: 5 [IU] via SUBCUTANEOUS
  Administered 2015-06-18 (×2): 1 [IU] via SUBCUTANEOUS
  Administered 2015-06-19: 2 [IU] via SUBCUTANEOUS
  Administered 2015-06-19: 1 [IU] via SUBCUTANEOUS
  Administered 2015-06-19: 2 [IU] via SUBCUTANEOUS
  Administered 2015-06-19: 1 [IU] via SUBCUTANEOUS
  Administered 2015-06-19: 2 [IU] via SUBCUTANEOUS
  Administered 2015-06-20 – 2015-06-24 (×3): 1 [IU] via SUBCUTANEOUS
  Administered 2015-06-24 – 2015-06-27 (×2): 2 [IU] via SUBCUTANEOUS
  Administered 2015-06-27 (×3): 1 [IU] via SUBCUTANEOUS
  Filled 2015-06-15 (×3): qty 1
  Filled 2015-06-15: qty 2
  Filled 2015-06-15: qty 1
  Filled 2015-06-15 (×2): qty 2
  Filled 2015-06-15 (×2): qty 1
  Filled 2015-06-15: qty 2
  Filled 2015-06-15: qty 5
  Filled 2015-06-15 (×2): qty 1
  Filled 2015-06-15 (×2): qty 5
  Filled 2015-06-15 (×2): qty 1
  Filled 2015-06-15: qty 2
  Filled 2015-06-15 (×2): qty 1
  Filled 2015-06-15: qty 2
  Filled 2015-06-15 (×4): qty 5
  Filled 2015-06-15 (×2): qty 1
  Filled 2015-06-15: qty 2
  Filled 2015-06-15: qty 3
  Filled 2015-06-15 (×2): qty 5
  Filled 2015-06-15: qty 1
  Filled 2015-06-15: qty 2
  Filled 2015-06-15: qty 5
  Filled 2015-06-15: qty 1

## 2015-06-15 MED ORDER — POTASSIUM CHLORIDE 10 MEQ/100ML IV SOLN
10.0000 meq | INTRAVENOUS | Status: AC
  Administered 2015-06-15 (×3): 10 meq via INTRAVENOUS
  Filled 2015-06-15 (×3): qty 100

## 2015-06-15 MED ORDER — FUROSEMIDE 10 MG/ML IJ SOLN
40.0000 mg | Freq: Two times a day (BID) | INTRAMUSCULAR | Status: DC
  Administered 2015-06-15 – 2015-06-17 (×6): 40 mg via INTRAVENOUS
  Filled 2015-06-15 (×7): qty 4

## 2015-06-15 NOTE — Progress Notes (Signed)
[facility] Consultation     ASSESSMENT/PLAN   Acute respiratory failure secondary to volume overload from congestive heart failure as well as aspiration pneumonia with hiatal hernia. Patient was extubated on 06/12/2015. Course complicated by myasthenia gravis, mental and psych issues. Reintubated 06/14/2015 due to aspiration pneumonia  with worsening respiratory status, poor secretion clearance, mental status impairment.  PULMONARY  A: Acute hypoxic respiratory failure with pulmonary edema. Extubated yesterday. Now reintubated due to aspiration pneumonia. -Aspiration pneumonia with acute respiratory failure. Now ventilator dependent. Chest x-ray report and images from 06/13/2015 reviewed as well. As CT image and report report of the abdomen from number 08/28/2015. Together these images show evidence of a large hiatal hernia as well as intrathoracic stomach and the most recent chest x-ray images consistent with aspiration pneumonia. -Chronic hypercapnic respiratory failure with hypoventilation. -COPD -Nicotine abuse -Poor airway clearance, likely due to myasthenia gravis.  P:   -Continue vent support, weaning may be difficult as patient has poor airway toileting and would be at high risk of reintubation. -Continue antibiotics for anaerobic coverage and vancomycin. -Currently on nicotine patch.  CARDIOVASCULAR  A: Acute systolic congestive heart failure. Atrial fibrillation. P:  Continue diuresis per cardiology and nephrology recommendations.  RENAL A:  Acute kidney injury P:   Nephrology following  GASTROINTESTINAL A: History of GI bleed, on recent admission. P:   Continue the patient on Protonix. Monitor hemoglobins periodically. -. Recent GI bleed.  subcutaneous heparin from every  12 hours.   HEMATOLOGIC A:  Chronic anemia P:    INFECTIOUS -Pneumonia MICRO DATA: MRSA PCR negative Urine - Blood- Resp -  ENDOCRINE Blood sugars  adequately controlled at this time.  NEUROLOGIC A:  Myasthenia Gravis. History of bipolar disorder P:   -Patient was on prednisone 30 mg every other day at home. We'll start the patient on IV steroids here. -Continue Mestinon.  MAJOR EVENTS/TEST RESULTS: -Intubated 06/11/2015>>. Extubated 06/12/2015 -Reintubated 06/15/2015  INDWELLING DEVICES:: Urinary catheter placed 06/11/2015    DVT prophylaxis: Subcutaneous heparin. Gastric intestinal prophylaxis: Protonix, IV     ---------------------------------------  ---------------------------------------   Name: Mackenzie Rose MRN: [id number] DOB: [birth year]    ADMISSION DATE:  06/08/2015 CONSULTATION DATE: 06/12/2015  REFERRING MD :  Dr. Earleen Newport.   CHIEF COMPLAINT:  Dyspnea   HISTORY OF PRESENT ILLNESS:   No acute events overnight     REVIEW OF SYSTEMS:   Patient is currently intubated and on a ventilator and cannot provide a review of systems; she is critically ill and on the ventilator.    VITAL SIGNS: Temp:  [97.2 F (36.2 C)-98.4 F (36.9 C)] 98.2 F (36.8 C) (10/07 1209) Pulse Rate:  [66-99] 69 (10/07 1300) Resp:  [19-35] 22 (10/07 1300) BP: (72-163)/(39-68) 87/46 mmHg (10/07 1300) SpO2:  [92 %-100 %] 100 % (10/07 1300) FiO2 (%):  [35 %-50 %] 35 % (10/07 1200) Weight:  [59.3 kg (130 lb 11.7 oz)] 59.3 kg (130 lb 11.7 oz) (10/07 0428) HEMODYNAMICS:   VENTILATOR SETTINGS: Vent Mode:  [-] PRVC FiO2 (%):  [35 %-50 %] 35 % Set Rate:  [18 bmp] 18 bmp Vt Set:  [450 mL] 450 mL PEEP:  [5 cmH20] 5 cmH20 Pressure Support:  [5 cmH20] 5 cmH20 INTAKE / OUTPUT:  Intake/Output Summary (Last 24 hours) at 06/15/15 1431 Last data filed at 06/15/15 1204  Gross per 24 hour  Intake 1872.51 ml  Output   1850 ml  Net  22.51 ml    Physical Examination:  VS: BP 87/46 mmHg  Pulse 69  Temp(Src) 98.2 F (36.8 C) (Oral)  Resp 22  Ht 5\' 4"  (1.626 m)  Wt 59.3 kg (130 lb 11.7 oz)  BMI 22.43 kg/m2  SpO2 100%    General Appearance: No distress  Neuro:without focal findings, mental status reduced , sensation grossly normal  HEENT: PERRLA, EOM intact, no ptosis, no other lesions noticed;  Pulmonary: normal breath sounds., diaphragmatic excursion normal.   CardiovascularNormal S1,S2.  No m/r/g.    Abdomen: Benign, Soft, non-tender,  Renal:  No costovertebral tenderness  GU:  Not performed at this time. Endoc: No evident thyromegaly, no signs of acromegaly. Skin:   warm, no rashes, no ecchymosis  Extremities: normal, no cyanosis, clubbing,   LABS: Reviewed   LABORATORY PANEL:   CBC  Recent Labs Lab 06/15/15 0400  WBC 11.3*  HGB 9.2*  HCT 30.3*  PLT 216    Chemistries   Recent Labs Lab 06/08/15 2310  06/09/15 0527  06/12/15 0522  06/15/15 0400  NA 135  --  143  < > 144  < > 151*  K 6.2*  --  4.3  < > 3.8  < > 3.4*  CL 102  --  104  < > 104  < > 105  CO2 25  --  31  < > 31  < > 37*  GLUCOSE 236*  --  113*  < > 88  < > 147*  BUN 25*  --  25*  < > 36*  < > 52*  CREATININE 2.15*  --  1.96*  < > 1.69*  < > 1.73*  CALCIUM 8.8*  --  9.2  < > 8.6*  < > 9.0  MG  --   --  2.0  --   --   --   --   PHOS  --   < > 4.5  --  2.8  --   --   AST 26  --   --   --   --   --   --   ALT 18  --   --   --   --   --   --   ALKPHOS 114  --   --   --   --   --   --   BILITOT 0.7  --   --   --   --   --   --   < > = values in this interval not displayed.   Recent Labs Lab 06/14/15 1202 06/14/15 1649 06/14/15 2126 06/15/15 0421 06/15/15 0826 06/15/15 1141  GLUCAP 117* 145* 104* 138* 129* 109*    Recent Labs Lab 06/13/15 1825 06/14/15 1252 06/15/15 0433  PHART 7.48* 7.51* 7.59*  PCO2ART 48 50* 42  PO2ART 64* 70* 62*    Recent Labs Lab 06/08/15 2310 06/12/15 0522  AST 26  --   ALT 18  --   ALKPHOS 114  --   BILITOT 0.7  --   ALBUMIN 3.2* 2.2*    Cardiac Enzymes  Recent Labs Lab 06/08/15 2310  TROPONINI 0.12*    RADIOLOGY:  Dg Chest 1 View  06/15/2015   CLINICAL  DATA:  Dyspnea.  Subsequent encounter.  Intubated patient.  EXAM: CHEST 1 VIEW  COMPARISON:  06/14/2015  FINDINGS: Right greater than left lung base airspace opacity is without significant change from the previous day's study. No new lung opacities. No pneumothorax.  Endotracheal tube, right PICC and orogastric tube are stable.  IMPRESSION: 1. No significant change from the previous day's study. 2. Support apparatus is stable and well positioned. 3. Persistent right greater than left lung base opacity. This may reflect combination of atelectasis and pleural fluid. Infection is possible. No evidence of pulmonary edema.   Electronically Signed   By: Lajean Manes M.D.   On: 06/15/2015 07:32   Dg Chest 1 View  06/14/2015   CLINICAL DATA:  Shortness of breath.  EXAM: CHEST 1 VIEW  COMPARISON:  06/13/2015.  FINDINGS: Mediastinum hilar structures are normal. Persistent cardiomegaly. Persistent basilar infiltrates consistent with bibasilar pneumonia and/or pulmonary edema. No significant change. Small bilateral pleural effusions. No pneumothorax.  IMPRESSION: 1. Persistent cardiomegaly. 2. Persistent bibasilar infiltrates and/or pulmonary edema without significant interim change. Small persistent bilateral effusions.   Electronically Signed   By: Marcello Moores  Register   On: 06/14/2015 07:30   Dg Chest Port 1 View  06/14/2015   CLINICAL DATA:  Repositioning of peripherally inserted central catheter  EXAM: PORTABLE CHEST 1 VIEW  COMPARISON:  June 14, 2015  FINDINGS: Central catheter tip is at the cavoatrial junction. Endotracheal tube tip is 3.5 cm above the carina. Nasogastric tube tip and side port are below the diaphragm. No pneumothorax. There is atelectatic change in the right base with small right effusion. There is a calcified granuloma in the left upper lobe. Lungs elsewhere are clear. Heart is mildly enlarged with pulmonary vascularity within normal limits.  IMPRESSION: Tube and catheter positions as described  without pneumothorax. Note that the tip of the central catheter is now at the cavoatrial junction. No pneumothorax. Calcified granuloma left upper lobe, stable. Atelectasis right base with small right effusion. No new opacity. No change in cardiac silhouette.   Electronically Signed   By: Lowella Grip III M.D.   On: 06/14/2015 12:22   Dg Chest Port 1 View  06/14/2015   CLINICAL DATA:  69 year old female PICC line placement and intubated. Earlier shortness of Breath. Initial encounter.  EXAM: PORTABLE CHEST 1 VIEW  COMPARISON:  0603 hours today, and earlier.  FINDINGS: Portable AP upright view at 1207 hours. Endotracheal tube tip in good position between the level the clavicles and carina. Enteric tube placed and courses to the left upper quadrant, tip not included. Right upper extremity approach PICC line catheter tip terminates about 1 cm below the cavoatrial junction level. Stable cardiac size and mediastinal contours. Bilateral pleural effusions have not significantly changed. No acute pulmonary edema. No pneumothorax.  IMPRESSION: 1. Right PICC line catheter tip projects about 1 cm below the cavoatrial junction level. 2. Endotracheal tube in good position. Enteric tube courses to the abdomen. 3. Stable bilateral pleural effusions.   Electronically Signed   By: Genevie Ann M.D.   On: 06/14/2015 12:17   Dg Abd Portable 1v  06/14/2015   CLINICAL DATA:  69 year old female enteric tube placement. Initial encounter.  EXAM: PORTABLE ABDOMEN - 1 VIEW  COMPARISON:  CT Abdomen and Pelvis 05/21/2015.  FINDINGS: Moderate-sized gastric hiatal hernia demonstrated on the CT comparison.  Portable AP upright view at 1208 hrs. Enteric tube loops in the stomach. The tip is directed retrograde at the level of the diaphragm. However, the side hole is below the diaphragm at the level of the mid to distal gastric body when accounting for the hiatal hernia. Placement therefore considered satisfactory.  Paucity of bowel gas.  Dextro convex spinal scoliosis with widespread degenerative osseous changes.  IMPRESSION: 1. Satisfactory enteric tube placement when considering presence of hiatal  hernia as above. 2. Non obstructed bowel gas pattern.   Electronically Signed   By: Genevie Ann M.D.   On: 06/14/2015 12:19       --Marda Stalker, MD.  Board Certified in Internal Medicine, Pulmonary Medicine, [place], and Sleep Medicine.   [facility]   Patricia Pesa, M.D.  Vilinda Boehringer, M.D.  Merton Border, M.D  [place].  I have personally obtained a history, examined the patient, evaluated laboratory and imaging results, formulated the assessment and plan and placed orders. I have personally discussed and coordinated care of the patient with ICU nurse, pharmacist, nutrition services, case manager.  The Patient requires high complexity decision making for assessment and support, frequent evaluation and titration of therapies, application of advanced monitoring technologies and extensive interpretation of multiple databases. The patient has critical illness that could lead imminently to failure of 1 or more organ systems and requires the highest level of physician preparedness to intervene.  Critical Care Time devoted to patient care services described in this note is 35 minutes and is exclusive of time spent in procedures.    06/15/2015, 2:31 PM

## 2015-06-15 NOTE — Plan of Care (Signed)
Problem: ICU Phase Progression Outcomes Goal: O2 sats trending toward baseline Outcome: Progressing Currently on vent: 30%/ 5p / 18 rate /472ml.  Sats good.  Wake up assessment done this am.  SBT tried and failed due to anxiety, tachypnea, increased work of breathing. Dr Juanell Fairly aware. Goal: Initial discharge plan identified Outcome: Progressing Care manager aware. Skilled nursing facility will be likely be needed at discharge Goal: Voiding-avoid urinary catheter unless indicated Outcome: Not Progressing Foley cath needed for sedated pt on vent and aggressive diuresis.

## 2015-06-15 NOTE — Progress Notes (Signed)
Patient remains intubated on the vent, propofol increased to 53mcg for patient comfort with positive results. Patient diuresing well with lasix, vital signs WDL, see [facility] for further assessment. Will continue to assess for changes/need.

## 2015-06-15 NOTE — Progress Notes (Signed)
ANTIBIOTIC CONSULT NOTE - INITIAL  Pharmacy Consult for Unasyn/Vancomycin Indication: pneumonia  Allergies  Allergen Reactions  . Lactose Intolerance (Gi) Other (See Comments)    Reaction:  GI upset   . Lithium Other (See Comments)    Reaction:  Unknown   . Penicillins Rash    Patient Measurements: Height: 5\' 4"  (162.6 cm) Weight: 130 lb 11.7 oz (59.3 kg) IBW/kg (Calculated) : 54.7   Vital Signs: Temp: 98.2 F (36.8 C) (10/07 1209) Temp Source: Oral (10/07 1209) BP: 87/46 mmHg (10/07 1300) Pulse Rate: 69 (10/07 1300) Intake/Output from previous day: 10/06 0701 - 10/07 0700 In: 1541.8 [I.V.:426; NG/GT:805.8; IV Piggyback:250] Out: 9326 [Urine:1475] Intake/Output from this shift: Total I/O In: 593.3 [I.V.:68.3; NG/GT:225; IV Piggyback:300] Out: 375 [Urine:375]  Labs:  Recent Labs  06/13/15 0448 06/14/15 0409 06/15/15 0400  WBC 10.3 10.3 11.3*  HGB 10.3* 10.6* 9.2*  PLT 235 271 216  CREATININE 1.71* 1.78* 1.73*   Estimated Creatinine Clearance: 25.8 mL/min (by C-G formula based on Cr of 1.73). No results for input(s): VANCOTROUGH, VANCOPEAK, VANCORANDOM, GENTTROUGH, GENTPEAK, GENTRANDOM, TOBRATROUGH, TOBRAPEAK, TOBRARND, AMIKACINPEAK, AMIKACINTROU, AMIKACIN in the last 72 hours.   Microbiology: Recent Results (from the past 720 hour(s))  Urine culture     Status: None   Collection Time: 05/20/15  9:23 PM  Result Value Ref Range Status   Specimen Description URINE, RANDOM  Final   Special Requests NONE  Final   Culture MULTIPLE SPECIES PRESENT, SUGGEST RECOLLECTION  Final   Report Status 05/22/2015 FINAL  Final  MRSA PCR Screening     Status: None   Collection Time: 05/21/15  3:53 AM  Result Value Ref Range Status   MRSA by PCR NEGATIVE NEGATIVE Final    Comment:        The GeneXpert MRSA Assay (FDA approved for NASAL specimens only), is one component of a comprehensive MRSA colonization surveillance program. It is not intended to diagnose  MRSA infection nor to guide or monitor treatment for MRSA infections.   MRSA PCR Screening     Status: None   Collection Time: 06/09/15  5:26 AM  Result Value Ref Range Status   MRSA by PCR NEGATIVE NEGATIVE Final    Comment:        The GeneXpert MRSA Assay (FDA approved for NASAL specimens only), is one component of a comprehensive MRSA colonization surveillance program. It is not intended to diagnose MRSA infection nor to guide or monitor treatment for MRSA infections.     Medical History: Past Medical History  Diagnosis Date  . Heart disease   . Atrial fibrillation ([place])     a. reported a-fib; b. unknown chronicity; c. dates back to 1970s; d. not on long term anticoagulation 2/2 no documented a-fib  . Hypertension   . High cholesterol   . 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 COPD (chronic obstructive pulmonary disease) ([place])   . Melanoma ([place])   . Kidney disorder   . Tremor   . Autoimmune disease ([place])   . Depressed   . Anxiety disorder   . Migraine   . Dementia   . Motion sickness   . Myasthenia gravis ([place])   . Bipolar disorder ([place])   . Dementia   . Acute renal failure ([place])   . Near syncope     Medications:  Scheduled:  . ampicillin-sulbactam (UNASYN) IV  1.5 g Intravenous Q12H  . antiseptic oral rinse  7 mL Mouth Rinse QID  . carvedilol  3.125 mg Per Tube BID WC  . chlorhexidine gluconate  15 mL Mouth Rinse BID  . feeding supplement (VITAL HIGH PROTEIN)  1,000 mL Per Tube Q24H  . fentaNYL (SUBLIMAZE) injection  50 mcg Intravenous Once  . free water  50 mL Per Tube Q4H  . furosemide  40 mg Intravenous Q12H  . heparin  5,000 Units Subcutaneous Q12H  . hydrALAZINE  50 mg Per Tube TID  . insulin aspart  0-9 Units Subcutaneous 6 times per day  . lamoTRIgine  100 mg Per Tube BID  . latanoprost  1 drop Both Eyes QHS  . nystatin   Topical TID  . pantoprazole sodium  40 mg Per Tube BID  . potassium chloride  20 mEq Per Tube BID  . pyridostigmine  60 mg  Per Tube TID  . sodium chloride  3 mL Intravenous Q12H  . spironolactone  25 mg Per Tube Daily  . vancomycin  750 mg Intravenous Q24H   Infusions:  . sodium chloride 10 mL/hr at 06/15/15 0700  . propofol (DIPRIVAN) infusion 30 mcg/kg/min (06/15/15 1200)   PRN: albuterol, fentaNYL (SUBLIMAZE) injection, guaiFENesin, ipratropium-albuterol, ondansetron **OR** ondansetron (ZOFRAN) IV, senna  Assessment:    Pharmacy consulted to dose vancomycin and Unasyn for 69 yo female re-intubated previously ordered levofloxacin.   Goal of Therapy:  Resolution of infection  Plan:  1. Unasyn: Will continue Unasyn 1.5g IV Q12hr. Patient has listed allergy to PCN of rash. Spoke with RN and asked that she monitor for signs and symptoms.   2. Vancomycin: Will continue patient on vancomycin 750mg  IV Q24hr for goal trough of 15-20. Will obtain trough prior to dose on 10/9.    Pharmacy will continue to monitor and adjust per consult.     Ulice Dash D 06/15/2015,2:34 PM

## 2015-06-15 NOTE — Progress Notes (Signed)
[facility]  ROUNDING NOTE   Subjective:   Intubated yesterday morning.  Creatinine remains stable.  Na 151 (149)  UOP 1475  Objective:  Vital signs in last 24 hours:  Temp:  [97.2 F (36.2 C)-98.4 F (36.9 C)] 98.3 F (36.8 C) (10/07 0730) Pulse Rate:  [66-99] 72 (10/07 0800) Resp:  [18-32] 24 (10/07 0800) BP: (72-163)/(42-98) 121/66 mmHg (10/07 0800) SpO2:  [83 %-100 %] 100 % (10/07 0800) FiO2 (%):  [40 %-100 %] 40 % (10/07 0730) Weight:  [59.3 kg (130 lb 11.7 oz)] 59.3 kg (130 lb 11.7 oz) (10/07 0428)  Weight change: -6.4 kg (-14 lb 1.8 oz) Filed Weights   06/13/15 0500 06/14/15 0500 06/15/15 0428  Weight: 65.2 kg (143 lb 11.8 oz) 65.7 kg (144 lb 13.5 oz) 59.3 kg (130 lb 11.7 oz)    Intake/Output: I/O last 3 completed shifts: In: 1541.8 [I.V.:426; Other:60; NG/GT:805.8; IV Piggyback:250] Out: 2775 [Urine:2775]   Intake/Output this shift:  Total I/O In: 60 [I.V.:10; NG/GT:50] Out: 200 [Urine:200]  Physical Exam: General: Critically ill, intubated, sedated  Head: Normocephalic, atraumatic, +ETT  Eyes: Anicteric, PERRL  Neck: Supple, trachea midline  Lungs:  Bilateral crackles, mechanical ventilation PRVC 40%  Heart: Regular rate and rhythm  Abdomen:  Soft, nontender  Extremities: 1+ peripheral edema.  Neurologic: sedated  Skin: No lesions  GU Foley with urine    Basic Metabolic Panel:  Recent Labs Lab 06/09/15 0527  06/11/15 0413 06/12/15 0522 06/13/15 0448 06/14/15 0409 06/15/15 0400  NA 143  < > 139 144 146* 149* 151*  K 4.3  < > 3.5 3.8 3.7 3.7 3.4*  CL 104  < > 101 104 101 100* 105  CO2 31  < > 31 31 31  35* 37*  GLUCOSE 113*  < > 116* 88 103* 153* 147*  BUN 25*  < > 32* 36* 42* 44* 52*  CREATININE 1.96*  < > 1.64* 1.69* 1.71* 1.78* 1.73*  CALCIUM 9.2  < > 8.5* 8.6* 9.4 9.8 9.0  MG 2.0  --   --   --   --   --   --   PHOS 4.5  --   --  2.8  --   --   --   < > = values in this interval not displayed.  Liver Function  Tests:  Recent Labs Lab 06/08/15 2310 06/12/15 0522  AST 26  --   ALT 18  --   ALKPHOS 114  --   BILITOT 0.7  --   PROT 6.1*  --   ALBUMIN 3.2* 2.2*   No results for input(s): LIPASE, AMYLASE in the last 168 hours. No results for input(s): AMMONIA in the last 168 hours.  CBC:  Recent Labs Lab 06/08/15 2310 06/09/15 0527 06/13/15 0448 06/14/15 0409 06/15/15 0400  WBC 9.4 6.4 10.3 10.3 11.3*  HGB 10.1* 9.8* 10.3* 10.6* 9.2*  HCT 33.4* 31.0* 33.8* 34.1* 30.3*  MCV 83.0 81.1 82.8 82.5 81.8  PLT 259 252 235 271 216    Cardiac Enzymes:  Recent Labs Lab 06/08/15 2310  TROPONINI 0.12*    BNP: Invalid input(s): POCBNP  CBG:  Recent Labs Lab 06/14/15 0715 06/14/15 1202 06/14/15 1649 06/14/15 2126 06/15/15 0421  GLUCAP 174* 117* 145* 104* 138*    Microbiology: Results for orders placed or performed during the hospital encounter of 06/08/15  MRSA PCR Screening     Status: None   Collection Time: 06/09/15  5:26 AM  Result Value Ref  Range Status   MRSA by PCR NEGATIVE NEGATIVE Final    Comment:        The GeneXpert MRSA Assay (FDA approved for NASAL specimens only), is one component of a comprehensive MRSA colonization surveillance program. It is not intended to diagnose MRSA infection nor to guide or monitor treatment for MRSA infections.     Coagulation Studies: No results for input(s): LABPROT, INR in the last 72 hours.  Urinalysis: No results for input(s): COLORURINE, LABSPEC, PHURINE, GLUCOSEU, HGBUR, BILIRUBINUR, KETONESUR, PROTEINUR, UROBILINOGEN, NITRITE, LEUKOCYTESUR in the last 72 hours.  Invalid input(s): APPERANCEUR    Imaging: Dg Chest 1 View  06/15/2015   CLINICAL DATA:  Dyspnea.  Subsequent encounter.  Intubated patient.  EXAM: CHEST 1 VIEW  COMPARISON:  06/14/2015  FINDINGS: Right greater than left lung base airspace opacity is without significant change from the previous day's study. No new lung opacities. No pneumothorax.   Endotracheal tube, right PICC and orogastric tube are stable.  IMPRESSION: 1. No significant change from the previous day's study. 2. Support apparatus is stable and well positioned. 3. Persistent right greater than left lung base opacity. This may reflect combination of atelectasis and pleural fluid. Infection is possible. No evidence of pulmonary edema.   Electronically Signed   By: Lajean Manes M.D.   On: 06/15/2015 07:32   Dg Chest 1 View  06/14/2015   CLINICAL DATA:  Shortness of breath.  EXAM: CHEST 1 VIEW  COMPARISON:  06/13/2015.  FINDINGS: Mediastinum hilar structures are normal. Persistent cardiomegaly. Persistent basilar infiltrates consistent with bibasilar pneumonia and/or pulmonary edema. No significant change. Small bilateral pleural effusions. No pneumothorax.  IMPRESSION: 1. Persistent cardiomegaly. 2. Persistent bibasilar infiltrates and/or pulmonary edema without significant interim change. Small persistent bilateral effusions.   Electronically Signed   By: Marcello Moores  Register   On: 06/14/2015 07:30   Dg Chest Port 1 View  06/14/2015   CLINICAL DATA:  Repositioning of peripherally inserted central catheter  EXAM: PORTABLE CHEST 1 VIEW  COMPARISON:  June 14, 2015  FINDINGS: Central catheter tip is at the cavoatrial junction. Endotracheal tube tip is 3.5 cm above the carina. Nasogastric tube tip and side port are below the diaphragm. No pneumothorax. There is atelectatic change in the right base with small right effusion. There is a calcified granuloma in the left upper lobe. Lungs elsewhere are clear. Heart is mildly enlarged with pulmonary vascularity within normal limits.  IMPRESSION: Tube and catheter positions as described without pneumothorax. Note that the tip of the central catheter is now at the cavoatrial junction. No pneumothorax. Calcified granuloma left upper lobe, stable. Atelectasis right base with small right effusion. No new opacity. No change in cardiac silhouette.    Electronically Signed   By: Lowella Grip III M.D.   On: 06/14/2015 12:22   Dg Chest Port 1 View  06/14/2015   CLINICAL DATA:  69 year old female PICC line placement and intubated. Earlier shortness of Breath. Initial encounter.  EXAM: PORTABLE CHEST 1 VIEW  COMPARISON:  0603 hours today, and earlier.  FINDINGS: Portable AP upright view at 1207 hours. Endotracheal tube tip in good position between the level the clavicles and carina. Enteric tube placed and courses to the left upper quadrant, tip not included. Right upper extremity approach PICC line catheter tip terminates about 1 cm below the cavoatrial junction level. Stable cardiac size and mediastinal contours. Bilateral pleural effusions have not significantly changed. No acute pulmonary edema. No pneumothorax.  IMPRESSION: 1. Right PICC line catheter  tip projects about 1 cm below the cavoatrial junction level. 2. Endotracheal tube in good position. Enteric tube courses to the abdomen. 3. Stable bilateral pleural effusions.   Electronically Signed   By: Genevie Ann M.D.   On: 06/14/2015 12:17   Dg Abd Portable 1v  06/14/2015   CLINICAL DATA:  69 year old female enteric tube placement. Initial encounter.  EXAM: PORTABLE ABDOMEN - 1 VIEW  COMPARISON:  CT Abdomen and Pelvis 05/21/2015.  FINDINGS: Moderate-sized gastric hiatal hernia demonstrated on the CT comparison.  Portable AP upright view at 1208 hrs. Enteric tube loops in the stomach. The tip is directed retrograde at the level of the diaphragm. However, the side hole is below the diaphragm at the level of the mid to distal gastric body when accounting for the hiatal hernia. Placement therefore considered satisfactory.  Paucity of bowel gas. Dextro convex spinal scoliosis with widespread degenerative osseous changes.  IMPRESSION: 1. Satisfactory enteric tube placement when considering presence of hiatal hernia as above. 2. Non obstructed bowel gas pattern.   Electronically Signed   By: Genevie Ann M.D.    On: 06/14/2015 12:19     Medications:   . sodium chloride 10 mL/hr at 06/15/15 0700  . propofol (DIPRIVAN) infusion Stopped (06/15/15 0800)   . ampicillin-sulbactam (UNASYN) IV  1.5 g Intravenous Q12H  . antiseptic oral rinse  7 mL Mouth Rinse QID  . carvedilol  3.125 mg Per Tube BID WC  . chlorhexidine gluconate  15 mL Mouth Rinse BID  . feeding supplement (VITAL HIGH PROTEIN)  1,000 mL Per Tube Q24H  . fentaNYL (SUBLIMAZE) injection  50 mcg Intravenous Once  . free water  25 mL Per Tube Q4H  . furosemide  80 mg Intravenous Q12H  . heparin  5,000 Units Subcutaneous Q12H  . hydrALAZINE  50 mg Per Tube TID  . insulin aspart  0-9 Units Subcutaneous 6 times per day  . lamoTRIgine  100 mg Per Tube BID  . latanoprost  1 drop Both Eyes QHS  . nystatin   Topical TID  . pantoprazole sodium  40 mg Per Tube BID  . potassium chloride  20 mEq Per Tube BID  . pyridostigmine  60 mg Per Tube TID  . sodium chloride  3 mL Intravenous Q12H  . spironolactone  25 mg Per Tube Daily  . vancomycin  750 mg Intravenous Q24H   albuterol, fentaNYL (SUBLIMAZE) injection, guaiFENesin, ipratropium-albuterol, ondansetron **OR** ondansetron (ZOFRAN) IV, senna  Assessment/ Plan:  [name] is a 69 y.o. white female with coronary artery disease, atrial fibrillation, hypertension, hyperlipidemia, diabetes mellitus type II, COPD, tremor, systolic congestive heart failure, depression, anxiety, dementia, bipolar disease who was admitted on 06/08/2015 for Hyperkalemia [E87.5] Acute renal insufficiency [N28.9] Acute congestive heart failure, unspecified congestive heart failure type ([place]) [I50.9]  1. 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 However now patient's creatinine has remained stable ~1.7. However now with metabolic alkalosis, hypernatremia and hypokalemia consistent with  overdiuresis. Currently on furosemide 80mg  IV q12  Chronic Kidney Disease with proteinuria secondary to diabetic nephropathy and renal artery stenosis. Left kidney atrophy on CT 05/21/2015.  - Will need outpatient renal dopplers.  - Renally dose all medications - replace potassium IV  2. Hypernatremia: free water deficit however with pulmonary edema. Due to furosemide. - Recommend decreasing furosemide to 40mg  IV q12 - free water flushes - increase to 38mL q4  3. Renal mass: right: seen on CT. Ultrasound found these to be simple cysts.   4. Acute exacerbation of systolic congestive heart failure: continue to monitor volume status - continue furosemide - as above.  - continue spironolactone    LOS: [place], [place] 10/7/20168:41 AM

## 2015-06-15 NOTE — Progress Notes (Signed)
Nutrition Follow-up     INTERVENTION:   EN: recommend continuing current TF regimen at current goal of 50 ml/hr; continue to adjust/assess based on kcals from diprivan. Noted MD adjusting free water flushes, lasix dose due to hypernatremia. Continue to assess   NUTRITION DIAGNOSIS:   Inadequate oral intake related to acute illness as evidenced by NPO status. Being addressed via TF  GOAL:   Patient will meet greater than or equal to 90% of their needs   MONITOR:    (Energy intake, Pulmonary profile, Digestive system)  REASON FOR ASSESSMENT:   Consult Enteral/tube feeding initiation and management  ASSESSMENT:   Pt remains on vent  EN: tolerating vital high protein at rate of 50 ml/hr  Digestive System: no signs of TF intolerance  Last BM:  10/4   Electrolyte and Renal Profile:  Recent Labs Lab 06/09/15 0527  06/12/15 0522 06/13/15 0448 06/14/15 0409 06/15/15 0400  BUN 25*  < > 36* 42* 44* 52*  CREATININE 1.96*  < > 1.69* 1.71* 1.78* 1.73*  NA 143  < > 144 146* 149* 151*  K 4.3  < > 3.8 3.7 3.7 3.4*  MG 2.0  --   --   --   --   --   PHOS 4.5  --  2.8  --   --   --   < > = values in this interval not displayed. Glucose Profile:  Recent Labs  06/15/15 0421 06/15/15 0826 06/15/15 1141  GLUCAP 138* 129* 109*   Meds: lasix, ss novolog, diprivan (currently at 7 ml/hr providing 184 kcals in 24 hour period)  Height:   Ht Readings from Last 1 Encounters:  06/11/15 5\' 4"  (1.626 m)    Weight:   Wt Readings from Last 1 Encounters:  06/15/15 130 lb 11.7 oz (59.3 kg)    Filed Weights   06/13/15 0500 06/14/15 0500 06/15/15 0428  Weight: 143 lb 11.8 oz (65.2 kg) 144 lb 13.5 oz (65.7 kg) 130 lb 11.7 oz (59.3 kg)    BMI:  Body mass index is 22.43 kg/(m^2).  Estimated Nutritional Needs:   Kcal:  1319 kcals/d  (Ve 8, Tmax 37)  Protein:  (1.2-2.0 g/kg) 78-130 g/d  Fluid:  25-37ml/kg) 1625-19103ml/d  EDUCATION NEEDS:   No education needs identified  at this time  [place], RD, LDN [phone] Pager

## 2015-06-15 NOTE — Progress Notes (Signed)
PT Cancellation Note  Patient Details Name: Mackenzie Rose MRN: [id number] DOB: 1946/04/19   Cancelled Treatment:    Reason Eval/Treat Not Completed: Medical issues which prohibited therapy. PT reviewed patient chart and noticed patient has now been intubated again. PT will complete current orders as this is the second time patient has been intubated since PT evaluation and await new orders once patient is more appropriate for mobility evaluation.   Kerman Passey, PT, DPT    06/15/2015, 1:04 PM

## 2015-06-15 NOTE — Progress Notes (Signed)
[place] [facility] [place] NAME: Mackenzie Rose    MR#:  [id number]  DATE OF BIRTH:  [birth year]  SUBJECTIVE:   extubated 10/4 and now intubated on the ventilator. Patient reintubated 06/14/2015     Review of Systems  Unable to perform ROS: intubated   Tolerating Diet:npo, on TF Tolerating PT: intubated  DRUG ALLERGIES:   Allergies  Allergen Reactions  . Lactose Intolerance (Gi) Other (See Comments)    Reaction:  GI upset   . Lithium Other (See Comments)    Reaction:  Unknown   . Penicillins Rash    VITALS:  Blood pressure 111/49, pulse 73, temperature 98.2 F (36.8 C), temperature source Oral, resp. rate 24, height 5\' 4"  (1.626 m), weight 59.3 kg (130 lb 11.7 oz), SpO2 100 %.  PHYSICAL EXAMINATION:   Physical Exam  GENERAL:  69 y.o.-year-old patient lying in the bed with no acute distress. Critically ill EYES: Pupils equal, round, reactive to light and accommodation. No scleral icterus. Extraocular muscles intact.  HEENT: Head atraumatic, normocephalic. Oropharynx and nasopharynx clear. Intubated on the ventilator. OG tube present. NECK:  Supple, no jugular venous distention. No thyroid enlargement, no tenderness.  LUNGS: decreased breath sounds bilaterally, no wheezing, + rales, no rhonchi. No use of accessory muscles of respiration. On venti mask CARDIOVASCULAR: S1, S2 normal. No murmurs, rubs, or gallops.  ABDOMEN: Soft, nontender, nondistended. Bowel sounds present. No organomegaly or mass.  EXTREMITIES: No cyanosis, clubbing  ++ edema b/l-improving NEUROLOGIC: unable to assess completely due to patient on the vent  PSYCHIATRIC:unable to assess intubated on the vent  SKIN: right groin/thigh fungal rash lesion-improved remarkably   LABORATORY PANEL:   CBC  Recent Labs Lab 06/15/15 0400  WBC 11.3*  HGB 9.2*  HCT 30.3*  PLT 216    Chemistries   Recent Labs Lab 06/08/15 2310 06/09/15 0527  06/15/15 0400   NA 135 143  < > 151*  K 6.2* 4.3  < > 3.4*  CL 102 104  < > 105  CO2 25 31  < > 37*  GLUCOSE 236* 113*  < > 147*  BUN 25* 25*  < > 52*  CREATININE 2.15* 1.96*  < > 1.73*  CALCIUM 8.8* 9.2  < > 9.0  MG  --  2.0  --   --   AST 26  --   --   --   ALT 18  --   --   --   ALKPHOS 114  --   --   --   BILITOT 0.7  --   --   --   < > = values in this interval not displayed.  Cardiac Enzymes  Recent Labs Lab 06/08/15 2310  TROPONINI 0.12*    RADIOLOGY:  Dg Chest 1 View  06/15/2015   CLINICAL DATA:  Dyspnea.  Subsequent encounter.  Intubated patient.  EXAM: CHEST 1 VIEW  COMPARISON:  06/14/2015  FINDINGS: Right greater than left lung base airspace opacity is without significant change from the previous day's study. No new lung opacities. No pneumothorax.  Endotracheal tube, right PICC and orogastric tube are stable.  IMPRESSION: 1. No significant change from the previous day's study. 2. Support apparatus is stable and well positioned. 3. Persistent right greater than left lung base opacity. This may reflect combination of atelectasis and pleural fluid. Infection is possible. No evidence of pulmonary edema.   Electronically Signed   By: Dedra Skeens.D.  On: 06/15/2015 07:32   Dg Chest 1 View  06/14/2015   CLINICAL DATA:  Shortness of breath.  EXAM: CHEST 1 VIEW  COMPARISON:  06/13/2015.  FINDINGS: Mediastinum hilar structures are normal. Persistent cardiomegaly. Persistent basilar infiltrates consistent with bibasilar pneumonia and/or pulmonary edema. No significant change. Small bilateral pleural effusions. No pneumothorax.  IMPRESSION: 1. Persistent cardiomegaly. 2. Persistent bibasilar infiltrates and/or pulmonary edema without significant interim change. Small persistent bilateral effusions.   Electronically Signed   By: Marcello Moores  Register   On: 06/14/2015 07:30   Dg Chest Port 1 View  06/14/2015   CLINICAL DATA:  Repositioning of peripherally inserted central catheter  EXAM: PORTABLE  CHEST 1 VIEW  COMPARISON:  June 14, 2015  FINDINGS: Central catheter tip is at the cavoatrial junction. Endotracheal tube tip is 3.5 cm above the carina. Nasogastric tube tip and side port are below the diaphragm. No pneumothorax. There is atelectatic change in the right base with small right effusion. There is a calcified granuloma in the left upper lobe. Lungs elsewhere are clear. Heart is mildly enlarged with pulmonary vascularity within normal limits.  IMPRESSION: Tube and catheter positions as described without pneumothorax. Note that the tip of the central catheter is now at the cavoatrial junction. No pneumothorax. Calcified granuloma left upper lobe, stable. Atelectasis right base with small right effusion. No new opacity. No change in cardiac silhouette.   Electronically Signed   By: Lowella Grip III M.D.   On: 06/14/2015 12:22   Dg Chest Port 1 View  06/14/2015   CLINICAL DATA:  69 year old female PICC line placement and intubated. Earlier shortness of Breath. Initial encounter.  EXAM: PORTABLE CHEST 1 VIEW  COMPARISON:  0603 hours today, and earlier.  FINDINGS: Portable AP upright view at 1207 hours. Endotracheal tube tip in good position between the level the clavicles and carina. Enteric tube placed and courses to the left upper quadrant, tip not included. Right upper extremity approach PICC line catheter tip terminates about 1 cm below the cavoatrial junction level. Stable cardiac size and mediastinal contours. Bilateral pleural effusions have not significantly changed. No acute pulmonary edema. No pneumothorax.  IMPRESSION: 1. Right PICC line catheter tip projects about 1 cm below the cavoatrial junction level. 2. Endotracheal tube in good position. Enteric tube courses to the abdomen. 3. Stable bilateral pleural effusions.   Electronically Signed   By: Genevie Ann M.D.   On: 06/14/2015 12:17   Dg Abd Portable 1v  06/14/2015   CLINICAL DATA:  69 year old female enteric tube placement. Initial  encounter.  EXAM: PORTABLE ABDOMEN - 1 VIEW  COMPARISON:  CT Abdomen and Pelvis 05/21/2015.  FINDINGS: Moderate-sized gastric hiatal hernia demonstrated on the CT comparison.  Portable AP upright view at 1208 hrs. Enteric tube loops in the stomach. The tip is directed retrograde at the level of the diaphragm. However, the side hole is below the diaphragm at the level of the mid to distal gastric body when accounting for the hiatal hernia. Placement therefore considered satisfactory.  Paucity of bowel gas. Dextro convex spinal scoliosis with widespread degenerative osseous changes.  IMPRESSION: 1. Satisfactory enteric tube placement when considering presence of hiatal hernia as above. 2. Non obstructed bowel gas pattern.   Electronically Signed   By: Genevie Ann M.D.   On: 06/14/2015 12:19     ASSESSMENT AND PLAN:  69 y.o. female who presents with shortness of breath, worsening over the last 3-4 days. Patient states that she's also had some intermittent chest  pains throughout the same time. She states that she's had some episodes of acute CHF in the past, but denies being chronically followed for CHF  1. acute hypoxic respiratory failure secondary to  Acute systolic CHF (congestive heart failure) and Aspiration PNA -Patient transferred to ICU given impending respiratory failure got intubated and on the ventilator October 3-->extubated 10/4-->re intubated October 6.  -IV propofol for sedation  -changed lasix to IV 80 mg bid ---> Lasix 40 mg twice a day  UOP  1400 cc over 24 hours -echo shows EF 35% (was 55-60% on July 2016) -cardiology consult with dr Clayborn Bigness noted -no ace due to CKD stage III -creat 1.94-->1.64-->1,71--1.73 -was on IV clindamycin--->changed to IV Zosyn and vanc (october 6th) for aspiration pneumonia  2. Acute on Chronic RF (acute renal failure) CKD-III -diuresing cautiously this time. Baseline creat 1.15. -Renal ultrasound bilateral kidney cysts. Avoid nephrotoxins Sodium today  151. Free water increased to 50 cc every 4 hourly and Lasix was decreased to 40 twice a day by nephrology  3. Myasthenia gravis - continue home meds for this  4.HTN (hypertension)  continue home antihypertensives coreg, hydralazine, spironolactone   5.Type 2 diabetes mellitus - sliding scale   6.COPD (chronic obstructive pulmonary disease) - continue home inhalers, when necessary DuoNeb's  7.h/o chronic afib -on coreg. Diltiazem discontinued due to new onset heart failure - No anticoagulation due to falls  8.Bipolar affective disorder, mixed - continue home meds  9. Right groin skin rash appears candidial Nystatin powder and po fluconazole x1 -does not have celluliitis. Rest shows much improvement.  Palliative consult placed. Overall has a poor prognosis. Son lives in [place] who was updated.  CODE STATUS:FULL  DVT Prophylaxis: lovenox  TOTAL TIME critical Spent in CARE OF THIS PATIENT: 40 minutes.  >50% time spent on counselling and coordination of care with ICU attending, RN  Khristian Phillippi M.D on 06/15/2015 at 9:03 AM  Between 7am to 6pm - Pager - [phone]  After 6pm go to [email or web address] - password [email or web address] [facility]  Office  [phone]  CC: Primary care physician; Donato Schultz, MD

## 2015-06-16 ENCOUNTER — Inpatient Hospital Stay: Payer: Medicare Other

## 2015-06-16 DIAGNOSIS — J690 Pneumonitis due to inhalation of food and vomit: Secondary | ICD-10-CM

## 2015-06-16 DIAGNOSIS — L8990 Pressure ulcer of unspecified site, unspecified stage: Secondary | ICD-10-CM

## 2015-06-16 DIAGNOSIS — R0602 Shortness of breath: Secondary | ICD-10-CM

## 2015-06-16 DIAGNOSIS — G7000 Myasthenia gravis without (acute) exacerbation: Secondary | ICD-10-CM

## 2015-06-16 LAB — BASIC METABOLIC PANEL
Anion gap: 8 (ref 5–15)
BUN: 58 mg/dL — AB (ref 6–20)
CALCIUM: 8.8 mg/dL — AB (ref 8.9–10.3)
CO2: 37 mmol/L — ABNORMAL HIGH (ref 22–32)
CREATININE: 1.75 mg/dL — AB (ref 0.44–1.00)
Chloride: 105 mmol/L (ref 101–111)
GFR calc non Af Amer: 28 mL/min — ABNORMAL LOW (ref >60)
GFR, EST AFRICAN AMERICAN: 33 mL/min — AB (ref >60)
Glucose, Bld: 200 mg/dL — ABNORMAL HIGH (ref 65–99)
Potassium: 4 mmol/L (ref 3.5–5.1)
SODIUM: 150 mmol/L — AB (ref 135–145)

## 2015-06-16 LAB — CBC
HCT: 29.5 % — ABNORMAL LOW (ref 35.0–47.0)
Hemoglobin: 9.1 g/dL — ABNORMAL LOW (ref 12.0–16.0)
MCH: 24.8 pg — AB (ref 26.0–34.0)
MCHC: 30.6 g/dL — AB (ref 32.0–36.0)
MCV: 81 fL (ref 80.0–100.0)
PLATELETS: 208 10*3/uL (ref 150–440)
RBC: 3.65 MIL/uL — ABNORMAL LOW (ref 3.80–5.20)
RDW: 28.3 % — AB (ref 11.5–14.5)
WBC: 14.7 10*3/uL — ABNORMAL HIGH (ref 3.6–11.0)

## 2015-06-16 LAB — BLOOD GAS, ARTERIAL
ACID-BASE EXCESS: 15.6 mmol/L — AB (ref 0.0–3.0)
BICARBONATE: 39.4 meq/L — AB (ref 21.0–28.0)
FIO2: 0.3
MECHVT: 450 mL
Mechanical Rate: 18
O2 SAT: 92.5 %
PATIENT TEMPERATURE: 37
PEEP/CPAP: 5 cmH2O
PH ART: 7.57 — AB (ref 7.350–7.450)
PO2 ART: 55 mmHg — AB (ref 83.0–108.0)
pCO2 arterial: 43 mmHg (ref 32.0–48.0)

## 2015-06-16 LAB — PROTIME-INR
INR: 1.08
PROTHROMBIN TIME: 14.2 s (ref 11.4–15.0)

## 2015-06-16 LAB — GLUCOSE, CAPILLARY
GLUCOSE-CAPILLARY: 116 mg/dL — AB (ref 65–99)
GLUCOSE-CAPILLARY: 293 mg/dL — AB (ref 65–99)
Glucose-Capillary: 150 mg/dL — ABNORMAL HIGH (ref 65–99)
Glucose-Capillary: 192 mg/dL — ABNORMAL HIGH (ref 65–99)
Glucose-Capillary: 274 mg/dL — ABNORMAL HIGH (ref 65–99)
Glucose-Capillary: 264 mg/dL — ABNORMAL HIGH (ref 65–99)
Glucose-Capillary: 285 mg/dL — ABNORMAL HIGH (ref 65–99)

## 2015-06-16 LAB — APTT: aPTT: 32 seconds (ref 24–36)

## 2015-06-16 MED ORDER — METHYLPREDNISOLONE SODIUM SUCC 40 MG IJ SOLR
40.0000 mg | Freq: Two times a day (BID) | INTRAMUSCULAR | Status: DC
  Administered 2015-06-16 – 2015-06-17 (×4): 40 mg via INTRAVENOUS
  Filled 2015-06-16 (×5): qty 1

## 2015-06-16 MED ORDER — FREE WATER
100.0000 mL | Status: DC
  Administered 2015-06-16 – 2015-06-18 (×12): 100 mL

## 2015-06-16 MED ORDER — HEPARIN (PORCINE) IN NACL 100-0.45 UNIT/ML-% IJ SOLN
1000.0000 [IU]/h | INTRAMUSCULAR | Status: DC
  Administered 2015-06-16: 1000 [IU]/h via INTRAVENOUS
  Administered 2015-06-17: 1100 [IU]/h via INTRAVENOUS
  Administered 2015-06-19 – 2015-06-25 (×6): 1050 [IU]/h via INTRAVENOUS
  Filled 2015-06-16 (×18): qty 250

## 2015-06-16 NOTE — Progress Notes (Signed)
Utilization Review Completed.  

## 2015-06-16 NOTE — Consult Note (Signed)
ANTICOAGULATION CONSULT NOTE - Initial Consult  Pharmacy Consult for heparin Indication: DVT  Allergies  Allergen Reactions  . Lactose Intolerance (Gi) Other (See Comments)    Reaction:  GI upset   . Lithium Other (See Comments)    Reaction:  Unknown   . Penicillins Rash    Patient Measurements: Height: 5\' 4"  (162.6 cm) Weight: 132 lb 7.9 oz (60.1 kg) IBW/kg (Calculated) : 54.7 Heparin Dosing Weight: 60.1kg  Vital Signs: Temp: 98.7 F (37.1 C) (10/08 1200) Temp Source: Oral (10/08 1200) BP: 119/66 mmHg (10/08 1300) Pulse Rate: 80 (10/08 1300)  Labs:  Recent Labs  06/14/15 0409 06/15/15 0400 06/16/15 0539  HGB 10.6* 9.2* 9.1*  HCT 34.1* 30.3* 29.5*  PLT 271 216 208  CREATININE 1.78* 1.73* 1.75*    Estimated Creatinine Clearance: 25.5 mL/min (by C-G formula based on Cr of 1.75).   Medical History: Past Medical History  Diagnosis Date  . Heart disease   . Atrial fibrillation ([place])     a. reported a-fib; b. unknown chronicity; c. dates back to 1970s; d. not on long term anticoagulation 2/2 no documented a-fib  . Hypertension   . High cholesterol   . Diabetes ([place])     borderline  . COPD (chronic obstructive pulmonary disease) ([place])   . Melanoma ([place])   . Kidney disorder   . Tremor   . Autoimmune disease ([place])   . Depressed   . Anxiety disorder   . Migraine   . Dementia   . Motion sickness   . Myasthenia gravis ([place])   . Bipolar disorder ([place])   . Dementia   . Acute renal failure ([place])   . Near syncope     Medications:  Scheduled:  . ampicillin-sulbactam (UNASYN) IV  1.5 g Intravenous Q12H  . antiseptic oral rinse  7 mL Mouth Rinse QID  . carvedilol  3.125 mg Per Tube BID WC  . chlorhexidine gluconate  15 mL Mouth Rinse BID  . feeding supplement (VITAL HIGH PROTEIN)  1,000 mL Per Tube Q24H  . fentaNYL (SUBLIMAZE) injection  50 mcg Intravenous Once  . free water  100 mL Per Tube Q4H  . furosemide  40 mg Intravenous Q12H  . hydrALAZINE  50 mg  Per Tube TID  . insulin aspart  0-9 Units Subcutaneous 6 times per day  . lamoTRIgine  100 mg Per Tube BID  . latanoprost  1 drop Both Eyes QHS  . methylPREDNISolone (SOLU-MEDROL) injection  40 mg Intravenous Q12H  . nystatin   Topical TID  . pantoprazole sodium  40 mg Per Tube BID  . potassium chloride  20 mEq Per Tube BID  . pyridostigmine  60 mg Per Tube TID  . sodium chloride  3 mL Intravenous Q12H  . spironolactone  25 mg Per Tube Daily  . vancomycin  750 mg Intravenous Q24H    Assessment: Pt is a 69 year old women in the ICU. Doppler today showed right axillary and brachial DVT, associated with PICC line. Heparin drip ordered by Dr. Mortimer Fries. Per Dr. Mortimer Fries, no bolus. Pharmacy consulted to dose Goal of Therapy:  Heparin level 0.3-0.7 units/ml Monitor platelets by anticoagulation protocol: Yes   Plan:  Start heparin infusion at 1000 units/hr Check anti-Xa level in 8 hours and daily while on heparin Continue to monitor H&H and platelets  Ioana Louks D Annetta Deiss 06/16/2015,4:15 PM

## 2015-06-16 NOTE — Progress Notes (Signed)
[facility] NAME: Mackenzie Rose    MR#:  [id number]  DATE OF BIRTH:  Oct 01, 1945  SUBJECTIVE:   Extubated 10/4 and now reintubated on back the ventilator. Patient reintubated 06/14/2015 Remains intubated and sedated     Review of Systems  Unable to perform ROS: intubated   Tolerating Diet: npo, on TF Tolerating PT: intubated  DRUG ALLERGIES:   Allergies  Allergen Reactions  . Lactose Intolerance (Gi) Other (See Comments)    Reaction:  GI upset   . Lithium Other (See Comments)    Reaction:  Unknown   . Penicillins Rash    VITALS:  Blood pressure 119/66, pulse 80, temperature 98.7 F (37.1 C), temperature source Oral, resp. rate 24, height 5\' 4"  (1.626 m), weight 60.1 kg (132 lb 7.9 oz), SpO2 96 %.  PHYSICAL EXAMINATION:   Physical Exam  GENERAL:  69 y.o.-year-old patient lying in the bed Critically ill appearing EYES: Pupils sluggish but reactive to light. No scleral icterus. HEENT: Head atraumatic, normocephalic. Oropharynx and nasopharynx clear. ET & OG tube in place NECK:  Supple, no jugular venous distention. No thyroid enlargement, no tenderness.  LUNGS: Bilateral rhonchi diffusely, no rales no wheezes. No use of accessory muscles of respiration. CARDIOVASCULAR: S1, S2 normal. No murmurs, rubs, or gallops.  ABDOMEN: Soft, nontender, nondistended. Bowel sounds present. No organomegaly or mass.  EXTREMITIES: No cyanosis, clubbing, +1 edema bilaterally.  Right upper extremity edema and warmth. NEUROLOGIC: Sedated and intubated  PSYCHIATRIC: Sedated and intubated SKIN: right groin/thigh fungal rash lesion-improved remarkably   LABORATORY PANEL:   CBC  Recent Labs Lab 06/16/15 0539  WBC 14.7*  HGB 9.1*  HCT 29.5*  PLT 208    Chemistries   Recent Labs Lab 06/16/15 0539  NA 150*  K 4.0  CL 105  CO2 37*  GLUCOSE 200*  BUN 58*  CREATININE 1.75*  CALCIUM 8.8*    Cardiac Enzymes No results  for input(s): TROPONINI in the last 168 hours.  RADIOLOGY:  Dg Chest 1 View  06/16/2015   CLINICAL DATA:  Acute respiratory failure. Congestive heart failure. Aspiration pneumonia.  EXAM: CHEST 1 VIEW  COMPARISON:  Chest x-rays dated 06/15/2015, 06/14/2015 and 06/13/2015  FINDINGS: Endotracheal tube, right PICC and NG tube appear in good position.  The infiltrate at the right lung base appears slightly improved. Infiltrate at the left base is essentially unchanged considering slight differences in patient rotation. Calcified granulomas in the mid left lung zone.  IMPRESSION: Slight improvement in the infiltrate at the right base. No change on the left.   Electronically Signed   By: Lorriane Shire M.D.   On: 06/16/2015 09:01   Dg Chest 1 View  06/15/2015   CLINICAL DATA:  Dyspnea.  Subsequent encounter.  Intubated patient.  EXAM: CHEST 1 VIEW  COMPARISON:  06/14/2015  FINDINGS: Right greater than left lung base airspace opacity is without significant change from the previous day's study. No new lung opacities. No pneumothorax.  Endotracheal tube, right PICC and orogastric tube are stable.  IMPRESSION: 1. No significant change from the previous day's study. 2. Support apparatus is stable and well positioned. 3. Persistent right greater than left lung base opacity. This may reflect combination of atelectasis and pleural fluid. Infection is possible. No evidence of pulmonary edema.   Electronically Signed   By: Lajean Manes M.D.   On: 06/15/2015 07:32     ASSESSMENT AND PLAN:  69 y.o. female who  presents with shortness of breath, worsening over the last 3-4 days. Patient states that she's also had some intermittent chest pains throughout the same time. She states that she's had some episodes of acute CHF in the past, but denies being chronically followed for CHF  1. acute hypoxic respiratory failure secondary to Acute systolic CHF (congestive heart failure) and Aspiration PNA -Patient was transferred to  ICU given impending respiratory failure got intubated and on the ventilator October 3-->extubated 10/4-->re intubated October 6.  -cont. IV propofol for sedation  - cont. IV Lasix 40 mg twice a day. About 4 L (-) since admission.  -echo shows EF 35-40% (was 55-60% on July 2016) -cardiology consult with dr Clayborn Bigness noted -no ace due to CKD stage III -creat 1.94-->1.64-->1,71--1.73 -was on IV clindamycin--->changed to IV Unasyn and vanc (october 6th) for aspiration pneumonia -Patient has some tongue swelling and some laryngeal edema and therefore difficult to wean off the vent. Started on IV steroids as per pulmonary.  2. Acute on Chronic RF (acute renal failure) CKD-III -cont. Diuresis and Cr. Stable.  Baseline creat 1.15. -Renal ultrasound bilateral kidney cysts.  3. Myasthenia gravis - no acute issue. - cont. Pyridostigmine  4.HTN (hypertension)-stable  -continue coreg, hydralazine, spironolactone  5.Type 2 diabetes mellitus -blood sugar stable and continue sliding scale insulin   6.COPD (chronic obstructive pulmonary disease) - continue duo nebs.    7.h/o chronic afib -Rate controlled and continue on coreg.   8.Bipolar affective disorder, mixed - continue Lamictal  9. Right groin skin rash appears candidial-much improved -Continue Nystatin powder   10. Right upper extremity swelling/warmth-rule out underlying DVT and we'll get an upper extremity ultrasound.  Await palliative care input   CODE STATUS:FULL  DVT Prophylaxis: lovenox  TOTAL CRITICAL CARE TIME Spent in CARE OF THIS PATIENT: 30 minutes.    [name] M.D on 06/16/2015 at 9:03 AM  Between 7am to 6pm - Pager - [phone]  After 6pm go to [email or web address] - password [email or web address] [facility]  Office  [phone]  CC: Primary care physician; Donato Schultz, MD

## 2015-06-16 NOTE — Progress Notes (Signed)
Performed sedation vacation- patient able to follow commands- Vitals stable- Updated Dr. Mortimer Fries - made him aware of patient's right arm more swollen than left and hot to touch- pt tongue swollen- orders for ultrasound of right arm and solumedrol.  Updated Dr. Mortimer Fries on ABG result- increased Fi02 to 40%- Orders to keep patient comfortably sedated.

## 2015-06-16 NOTE — Progress Notes (Signed)
Nutrition Follow-up     INTERVENTION:   EN: recommend continuing current TF regimen, additional kcals from diprivan taken into account. Noted hypernatremia persists, MD increased free water flushes.   NUTRITION DIAGNOSIS:   Inadequate oral intake related to acute illness as evidenced by NPO status.  GOAL:   Patient will meet greater than or equal to 90% of their needs  MONITOR:    (Energy intake, Pulmonary profile, Digestive system)  REASON FOR ASSESSMENT:   Consult Enteral/tube feeding initiation and management  ASSESSMENT:   Pt remains on vent, off sedation this AM for wakeup assessment  EN: tolerating Vital High Protein at rate of 50 ml/hr  Digestive System: no signs of TF intolerance  Last BM:  10/8, per Charli RN pt just had BM  Meds: diprivan 185 kcals in 24 hours  Electrolyte and Renal Profile:  Recent Labs Lab 06/12/15 0522  06/14/15 0409 06/15/15 0400 06/16/15 0539  BUN 36*  < > 44* 52* 58*  CREATININE 1.69*  < > 1.78* 1.73* 1.75*  NA 144  < > 149* 151* 150*  K 3.8  < > 3.7 3.4* 4.0  PHOS 2.8  --   --   --   --   < > = values in this interval not displayed. Glucose Profile:  Recent Labs  06/16/15 0334 06/16/15 0728 06/16/15 1114  GLUCAP 150* 192* 116*   Height:   Ht Readings from Last 1 Encounters:  06/11/15 5\' 4"  (1.626 m)    Weight:   Wt Readings from Last 1 Encounters:  06/16/15 132 lb 7.9 oz (60.1 kg)   BMI:  Body mass index is 22.73 kg/(m^2).  Estimated Nutritional Needs:   Kcal:  1319 kcals/d  (Ve 8, Tmax 37)  Protein:  (1.2-2.0 g/kg) 78-130 g/d  Fluid:  25-21ml/kg) 1625-1929ml/d  EDUCATION NEEDS:   No education needs identified at this time  [place], RD, LDN [phone] Pager

## 2015-06-16 NOTE — Progress Notes (Signed)
[facility] Consultation     ASSESSMENT/PLAN   Acute respiratory failure secondary to volume overload from congestive heart failure as well as aspiration pneumonia with hiatal hernia. Patient was extubated on 06/12/2015. Course complicated by myasthenia gravis, mental and psych issues. Reintubated 06/14/2015 due to aspiration pneumonia  with worsening respiratory status, poor secretion clearance, mental status impairment.Patient now with lip and tongue swelling  PULMONARY  A: Acute hypoxic respiratory failure with pulmonary edema. reintubated due to aspiration pneumonia. -Aspiration pneumonia with acute respiratory failure. Now ventilator dependent. Chest x-ray report and images from 06/13/2015 reviewed as well. As CT image and report report of the abdomen from number 08/28/2015. Together these images show evidence of a large hiatal hernia as well as intrathoracic stomach and the most recent chest x-ray images consistent with aspiration pneumonia. -Chronic hypercapnic respiratory failure with hypoventilation. -COPD -Nicotine abuse -Poor airway clearance, likely due to myasthenia gravis. -patient with lip and tongue swelling P:   -Continue vent support, weaning may be difficult as patient has poor airway toileting and would be at high risk of reintubation. -Continue antibiotics for anaerobic coverage and vancomycin. -Currently on nicotine patch. -start Iv steroids today  CARDIOVASCULAR  A: Acute systolic congestive heart failure. Atrial fibrillation. P:  Continue diuresis per cardiology and nephrology recommendations.  RENAL A:  Acute kidney injury P:   Nephrology following  GASTROINTESTINAL A: History of GI bleed, on recent admission. P:   Continue the patient on Protonix. Monitor hemoglobins periodically. -. Recent GI bleed.  subcutaneous heparin from every  12 hours.   HEMATOLOGIC A:  Chronic anemia P:    INFECTIOUS -Pneumonia -obtain  Sputum culture  MICRO DATA: MRSA PCR negative Urine - Blood- Resp -  ENDOCRINE Blood sugars adequately controlled at this time.  NEUROLOGIC A:  Myasthenia Gravis. History of bipolar disorder P:   -Continue Mestinon.  MAJOR EVENTS/TEST RESULTS: -Intubated 06/11/2015>>. Extubated 06/12/2015 -Reintubated 06/15/2015  INDWELLING DEVICES:: Urinary catheter placed 06/11/2015 PICC line placed RT arm 10/6    DVT prophylaxis: Subcutaneous heparin. Gastric intestinal prophylaxis: Protonix, IV     ---------------------------------------  ---------------------------------------   Name: Crickett Abbett MRN: [id number] DOB: Jun 14, 1946    ADMISSION DATE:  06/08/2015 CONSULTATION DATE: 06/12/2015  REFERRING MD :  Dr. Earleen Newport.   CHIEF COMPLAINT:  Dyspnea   HISTORY OF PRESENT ILLNESS:   No acute events overnight, remains intubated,sedated Lips and tongue swelling, unable to wean from vent today-will start steroids     REVIEW OF SYSTEMS:   Patient is currently intubated and on a ventilator and cannot provide a review of systems; she is critically ill and on the ventilator.    VITAL SIGNS: Temp:  [97.8 F (36.6 C)-99.7 F (37.6 C)] 99.7 F (37.6 C) (10/08 0800) Pulse Rate:  [67-89] 84 (10/08 0800) Resp:  [20-30] 24 (10/08 0800) BP: (83-131)/(39-80) 124/73 mmHg (10/08 0800) SpO2:  [91 %-100 %] 96 % (10/08 0800) FiO2 (%):  [30 %-40 %] 30 % (10/08 0821) Weight:  [132 lb 7.9 oz (60.1 kg)] 132 lb 7.9 oz (60.1 kg) (10/08 0409) HEMODYNAMICS:   VENTILATOR SETTINGS: Vent Mode:  [-] PRVC FiO2 (%):  [30 %-40 %] 30 % Set Rate:  [18 bmp] 18 bmp Vt Set:  [450 mL] 450 mL PEEP:  [5 cmH20] 5 cmH20 INTAKE / OUTPUT:  Intake/Output Summary (Last 24 hours) at 06/16/15 1027 Last data filed at 06/16/15 0600  Gross per 24 hour  Intake 1814.21 ml  Output   1975 ml  Net -  160.79 ml    Physical Examination:   VS: BP 124/73 mmHg  Pulse 84  Temp(Src) 99.7 F (37.6 C) (Oral)   Resp 24  Ht 5\' 4"  (1.626 m)  Wt 132 lb 7.9 oz (60.1 kg)  BMI 22.73 kg/m2  SpO2 96%  General Appearance: No distress  Neuro:without focal findings, mental status reduced , sensation grossly normal  HEENT: PERRLA, EOM intact, no ptosis, no other lesions noticed;  Pulmonary: normal breath sounds., diaphragmatic excursion normal.   CardiovascularNormal S1,S2.  No m/r/g.    Abdomen: Benign, Soft, non-tender,  Renal:  No costovertebral tenderness  GU:  Not performed at this time. Endoc: No evident thyromegaly, no signs of acromegaly. Skin:   warm, no rashes, no ecchymosis  Extremities: normal, no cyanosis, clubbing,  GCS 8T LABS: Reviewed   LABORATORY PANEL:   CBC  Recent Labs Lab 06/16/15 0539  WBC 14.7*  HGB 9.1*  HCT 29.5*  PLT 208    Chemistries   Recent Labs Lab 06/12/15 0522  06/16/15 0539  NA 144  < > 150*  K 3.8  < > 4.0  CL 104  < > 105  CO2 31  < > 37*  GLUCOSE 88  < > 200*  BUN 36*  < > 58*  CREATININE 1.69*  < > 1.75*  CALCIUM 8.6*  < > 8.8*  PHOS 2.8  --   --   < > = values in this interval not displayed.   Recent Labs Lab 06/15/15 1141 06/15/15 1621 06/15/15 2123 06/15/15 2340 06/16/15 0334 06/16/15 0728  GLUCAP 109* 150* 180* 136* 150* 192*    Recent Labs Lab 06/14/15 1252 06/15/15 0433 06/16/15 0431  PHART 7.51* 7.59* 7.57*  PCO2ART 50* 42 43  PO2ART 70* 62* 55*    Recent Labs Lab 06/12/15 0522  ALBUMIN 2.2*    Cardiac Enzymes No results for input(s): TROPONINI in the last 168 hours.  RADIOLOGY:  Dg Chest 1 View  06/16/2015   CLINICAL DATA:  Acute respiratory failure. Congestive heart failure. Aspiration pneumonia.  EXAM: CHEST 1 VIEW  COMPARISON:  Chest x-rays dated 06/15/2015, 06/14/2015 and 06/13/2015  FINDINGS: Endotracheal tube, right PICC and NG tube appear in good position.  The infiltrate at the right lung base appears slightly improved. Infiltrate at the left base is essentially unchanged considering slight  differences in patient rotation. Calcified granulomas in the mid left lung zone.  IMPRESSION: Slight improvement in the infiltrate at the right base. No change on the left.   Electronically Signed   By: Lorriane Shire M.D.   On: 06/16/2015 09:01   Dg Chest 1 View  06/15/2015   CLINICAL DATA:  Dyspnea.  Subsequent encounter.  Intubated patient.  EXAM: CHEST 1 VIEW  COMPARISON:  06/14/2015  FINDINGS: Right greater than left lung base airspace opacity is without significant change from the previous day's study. No new lung opacities. No pneumothorax.  Endotracheal tube, right PICC and orogastric tube are stable.  IMPRESSION: 1. No significant change from the previous day's study. 2. Support apparatus is stable and well positioned. 3. Persistent right greater than left lung base opacity. This may reflect combination of atelectasis and pleural fluid. Infection is possible. No evidence of pulmonary edema.   Electronically Signed   By: Lajean Manes M.D.   On: 06/15/2015 07:32   Dg Chest Port 1 View  06/14/2015   CLINICAL DATA:  Repositioning of peripherally inserted central catheter  EXAM: PORTABLE CHEST 1 VIEW  COMPARISON:  June 14, 2015  FINDINGS: Central catheter tip is at the cavoatrial junction. Endotracheal tube tip is 3.5 cm above the carina. Nasogastric tube tip and side port are below the diaphragm. No pneumothorax. There is atelectatic change in the right base with small right effusion. There is a calcified granuloma in the left upper lobe. Lungs elsewhere are clear. Heart is mildly enlarged with pulmonary vascularity within normal limits.  IMPRESSION: Tube and catheter positions as described without pneumothorax. Note that the tip of the central catheter is now at the cavoatrial junction. No pneumothorax. Calcified granuloma left upper lobe, stable. Atelectasis right base with small right effusion. No new opacity. No change in cardiac silhouette.   Electronically Signed   By: Lowella Grip III M.D.    On: 06/14/2015 12:22   Dg Chest Port 1 View  06/14/2015   CLINICAL DATA:  69 year old female PICC line placement and intubated. Earlier shortness of Breath. Initial encounter.  EXAM: PORTABLE CHEST 1 VIEW  COMPARISON:  0603 hours today, and earlier.  FINDINGS: Portable AP upright view at 1207 hours. Endotracheal tube tip in good position between the level the clavicles and carina. Enteric tube placed and courses to the left upper quadrant, tip not included. Right upper extremity approach PICC line catheter tip terminates about 1 cm below the cavoatrial junction level. Stable cardiac size and mediastinal contours. Bilateral pleural effusions have not significantly changed. No acute pulmonary edema. No pneumothorax.  IMPRESSION: 1. Right PICC line catheter tip projects about 1 cm below the cavoatrial junction level. 2. Endotracheal tube in good position. Enteric tube courses to the abdomen. 3. Stable bilateral pleural effusions.   Electronically Signed   By: Genevie Ann M.D.   On: 06/14/2015 12:17   Dg Abd Portable 1v  06/14/2015   CLINICAL DATA:  69 year old female enteric tube placement. Initial encounter.  EXAM: PORTABLE ABDOMEN - 1 VIEW  COMPARISON:  CT Abdomen and Pelvis 05/21/2015.  FINDINGS: Moderate-sized gastric hiatal hernia demonstrated on the CT comparison.  Portable AP upright view at 1208 hrs. Enteric tube loops in the stomach. The tip is directed retrograde at the level of the diaphragm. However, the side hole is below the diaphragm at the level of the mid to distal gastric body when accounting for the hiatal hernia. Placement therefore considered satisfactory.  Paucity of bowel gas. Dextro convex spinal scoliosis with widespread degenerative osseous changes.  IMPRESSION: 1. Satisfactory enteric tube placement when considering presence of hiatal hernia as above. 2. Non obstructed bowel gas pattern.   Electronically Signed   By: Genevie Ann M.D.   On: 06/14/2015 12:19     I have personally obtained a  history, examined the patient, evaluated Pertinent laboratory and [employer]/imaging results, and  formulated the assessment and plan   The Patient requires high complexity decision making for assessment and support, frequent evaluation and titration of therapies, application of advanced monitoring technologies and extensive interpretation of multiple databases. Critical Care Time devoted to patient care services described in this note is 35 minutes.   Overall, patient is critically ill, prognosis is guarded.     Corrin Parker, M.D.  [facility]  Medical Director [place] Director [facility] Cardio-Pulmonary Department

## 2015-06-16 NOTE — Progress Notes (Signed)
ANTIBIOTIC CONSULT NOTE - INITIAL  Pharmacy Consult for Unasyn/Vancomycin Indication: pneumonia  Allergies  Allergen Reactions  . Lactose Intolerance (Gi) Other (See Comments)    Reaction:  GI upset   . Lithium Other (See Comments)    Reaction:  Unknown   . Penicillins Rash    Patient Measurements: Height: 5\' 4"  (162.6 cm) Weight: 132 lb 7.9 oz (60.1 kg) IBW/kg (Calculated) : 54.7   Vital Signs: Temp: 99.7 F (37.6 C) (10/08 0800) Temp Source: Oral (10/08 0800) BP: 124/73 mmHg (10/08 0800) Pulse Rate: 84 (10/08 0800) Intake/Output from previous day: 10/07 0701 - 10/08 0700 In: 2075.7 [I.V.:400.7; NG/GT:1125; IV Piggyback:550] Out: 2350 [Urine:1900; Emesis/NG output:450] Intake/Output from this shift:    Labs:  Recent Labs  06/14/15 0409 06/15/15 0400 06/16/15 0539  WBC 10.3 11.3* 14.7*  HGB 10.6* 9.2* 9.1*  PLT 271 216 208  CREATININE 1.78* 1.73* 1.75*   Estimated Creatinine Clearance: 25.5 mL/min (by C-G formula based on Cr of 1.75). No results for input(s): VANCOTROUGH, VANCOPEAK, VANCORANDOM, GENTTROUGH, GENTPEAK, GENTRANDOM, TOBRATROUGH, TOBRAPEAK, TOBRARND, AMIKACINPEAK, AMIKACINTROU, AMIKACIN in the last 72 hours.   Microbiology: Recent Results (from the past 720 hour(s))  Urine culture     Status: None   Collection Time: 05/20/15  9:23 PM  Result Value Ref Range Status   Specimen Description URINE, RANDOM  Final   Special Requests NONE  Final   Culture MULTIPLE SPECIES PRESENT, SUGGEST RECOLLECTION  Final   Report Status 05/22/2015 FINAL  Final  MRSA PCR Screening     Status: None   Collection Time: 05/21/15  3:53 AM  Result Value Ref Range Status   MRSA by PCR NEGATIVE NEGATIVE Final    Comment:        The GeneXpert MRSA Assay (FDA approved for NASAL specimens only), is one component of a comprehensive MRSA colonization surveillance program. It is not intended to diagnose MRSA infection nor to guide or monitor treatment for MRSA  infections.   MRSA PCR Screening     Status: None   Collection Time: 06/09/15  5:26 AM  Result Value Ref Range Status   MRSA by PCR NEGATIVE NEGATIVE Final    Comment:        The GeneXpert MRSA Assay (FDA approved for NASAL specimens only), is one component of a comprehensive MRSA colonization surveillance program. It is not intended to diagnose MRSA infection nor to guide or monitor treatment for MRSA infections.     Medical History: Past Medical History  Diagnosis Date  . Heart disease   . Atrial fibrillation ([place])     a. reported a-fib; b. unknown chronicity; c. dates back to 1970s; d. not on long term anticoagulation 2/2 no documented a-fib  . Hypertension   . High cholesterol   . Diabetes ([place])     borderline  . COPD (chronic obstructive pulmonary disease) ([place])   . Melanoma ([place])   . Kidney disorder   . Tremor   . Autoimmune disease ([place])   . Depressed   . Anxiety disorder   . Migraine   . Dementia   . Motion sickness   . Myasthenia gravis ([place])   . Bipolar disorder ([place])   . Dementia   . Acute renal failure ([place])   . Near syncope     Medications:  Scheduled:  . ampicillin-sulbactam (UNASYN) IV  1.5 g Intravenous Q12H  . antiseptic oral rinse  7 mL Mouth Rinse QID  . carvedilol  3.125 mg Per Tube BID WC  .  chlorhexidine gluconate  15 mL Mouth Rinse BID  . feeding supplement (VITAL HIGH PROTEIN)  1,000 mL Per Tube Q24H  . fentaNYL (SUBLIMAZE) injection  50 mcg Intravenous Once  . free water  100 mL Per Tube Q4H  . furosemide  40 mg Intravenous Q12H  . heparin  5,000 Units Subcutaneous Q12H  . hydrALAZINE  50 mg Per Tube TID  . insulin aspart  0-9 Units Subcutaneous 6 times per day  . lamoTRIgine  100 mg Per Tube BID  . latanoprost  1 drop Both Eyes QHS  . nystatin   Topical TID  . pantoprazole sodium  40 mg Per Tube BID  . potassium chloride  20 mEq Per Tube BID  . pyridostigmine  60 mg Per Tube TID  . sodium chloride  3 mL Intravenous Q12H  .  spironolactone  25 mg Per Tube Daily  . vancomycin  750 mg Intravenous Q24H   Infusions:  . sodium chloride 10 mL/hr at 06/15/15 0700  . propofol (DIPRIVAN) infusion 10 mcg/kg/min (06/16/15 0431)   PRN: albuterol, fentaNYL (SUBLIMAZE) injection, guaiFENesin, ipratropium-albuterol, ondansetron **OR** ondansetron (ZOFRAN) IV, senna  Assessment:    Pharmacy consulted to dose vancomycin and Unasyn for 69 yo female re-intubated previously ordered levofloxacin.   Goal of Therapy:  Resolution of infection  Plan:  1. Unasyn: Will continue Unasyn 1.5g IV Q12hr.  2. Vancomycin: Will continue patient on vancomycin 750mg  IV Q24hr for goal trough of 15-20. Will obtain trough prior to dose on 10/9.    Pharmacy will continue to monitor and adjust per consult.     Ulice Dash D 06/16/2015,8:56 AM

## 2015-06-16 NOTE — Progress Notes (Signed)
Notified Dr. Mortimer Fries about results of ultrasound- order for heparin drip and to continue to use PICC line.

## 2015-06-16 NOTE — Progress Notes (Signed)
[facility]  ROUNDING NOTE   Subjective:   Na 150 (151)  UOP 1900 (1475)  Objective:  Vital signs in last 24 hours:  Temp:  [97.8 F (36.6 C)-99.7 F (37.6 C)] 99.7 F (37.6 C) (10/08 0800) Pulse Rate:  [67-99] 84 (10/08 0800) Resp:  [20-35] 24 (10/08 0800) BP: (83-163)/(39-80) 124/73 mmHg (10/08 0800) SpO2:  [91 %-100 %] 96 % (10/08 0800) FiO2 (%):  [30 %-40 %] 30 % (10/08 0821) Weight:  [60.1 kg (132 lb 7.9 oz)] 60.1 kg (132 lb 7.9 oz) (10/08 0409)  Weight change: 0.8 kg (1 lb 12.2 oz) Filed Weights   06/14/15 0500 06/15/15 0428 06/16/15 0409  Weight: 65.7 kg (144 lb 13.5 oz) 59.3 kg (130 lb 11.7 oz) 60.1 kg (132 lb 7.9 oz)    Intake/Output: I/O last 3 completed shifts: In: 3105.7 [I.V.:520.7; Other:60; NG/GT:1775; IV Piggyback:750] Out: 3200 [Urine:2750; Emesis/NG output:450]   Intake/Output this shift:     Physical Exam: General: Critically ill, intubated, sedated  Head: Normocephalic, atraumatic, +ETT  Eyes: Anicteric, PERRL  Neck: Supple, trachea midline  Lungs:  Bilateral crackles, mechanical ventilation PRVC 40%  Heart: Regular rate and rhythm  Abdomen:  Soft, nontender  Extremities: 1+ peripheral edema.  Neurologic: sedated  Skin: No lesions  GU Foley with urine    Basic Metabolic Panel:  Recent Labs Lab 06/12/15 0522 06/13/15 0448 06/14/15 0409 06/15/15 0400 06/16/15 0539  NA 144 146* 149* 151* 150*  K 3.8 3.7 3.7 3.4* 4.0  CL 104 101 100* 105 105  CO2 31 31 35* 37* 37*  GLUCOSE 88 103* 153* 147* 200*  BUN 36* 42* 44* 52* 58*  CREATININE 1.69* 1.71* 1.78* 1.73* 1.75*  CALCIUM 8.6* 9.4 9.8 9.0 8.8*  PHOS 2.8  --   --   --   --     Liver Function Tests:  Recent Labs Lab 06/12/15 0522  ALBUMIN 2.2*   No results for input(s): LIPASE, AMYLASE in the last 168 hours. No results for input(s): AMMONIA in the last 168 hours.  CBC:  Recent Labs Lab 06/13/15 0448 06/14/15 0409 06/15/15 0400 06/16/15 0539  WBC 10.3  10.3 11.3* 14.7*  HGB 10.3* 10.6* 9.2* 9.1*  HCT 33.8* 34.1* 30.3* 29.5*  MCV 82.8 82.5 81.8 81.0  PLT 235 271 216 208    Cardiac Enzymes: No results for input(s): CKTOTAL, CKMB, CKMBINDEX, TROPONINI in the last 168 hours.  BNP: Invalid input(s): POCBNP  CBG:  Recent Labs Lab 06/15/15 1621 06/15/15 2123 06/15/15 2340 06/16/15 0334 06/16/15 0728  GLUCAP 150* 180* 136* 150* 192*    Microbiology: Results for orders placed or performed during the hospital encounter of 06/08/15  MRSA PCR Screening     Status: None   Collection Time: 06/09/15  5:26 AM  Result Value Ref Range Status   MRSA by PCR NEGATIVE NEGATIVE Final    Comment:        The GeneXpert MRSA Assay (FDA approved for NASAL specimens only), is one component of a comprehensive MRSA colonization surveillance program. It is not intended to diagnose MRSA infection nor to guide or monitor treatment for MRSA infections.     Coagulation Studies: No results for input(s): LABPROT, INR in the last 72 hours.  Urinalysis: No results for input(s): COLORURINE, LABSPEC, PHURINE, GLUCOSEU, HGBUR, BILIRUBINUR, KETONESUR, PROTEINUR, UROBILINOGEN, NITRITE, LEUKOCYTESUR in the last 72 hours.  Invalid input(s): APPERANCEUR    Imaging: Dg Chest 1 View  06/15/2015   CLINICAL DATA:  Dyspnea.  Subsequent encounter.  Intubated patient.  EXAM: CHEST 1 VIEW  COMPARISON:  06/14/2015  FINDINGS: Right greater than left lung base airspace opacity is without significant change from the previous day's study. No new lung opacities. No pneumothorax.  Endotracheal tube, right PICC and orogastric tube are stable.  IMPRESSION: 1. No significant change from the previous day's study. 2. Support apparatus is stable and well positioned. 3. Persistent right greater than left lung base opacity. This may reflect combination of atelectasis and pleural fluid. Infection is possible. No evidence of pulmonary edema.   Electronically Signed   By: Lajean Manes M.D.   On: 06/15/2015 07:32   Dg Chest Port 1 View  06/14/2015   CLINICAL DATA:  Repositioning of peripherally inserted central catheter  EXAM: PORTABLE CHEST 1 VIEW  COMPARISON:  June 14, 2015  FINDINGS: Central catheter tip is at the cavoatrial junction. Endotracheal tube tip is 3.5 cm above the carina. Nasogastric tube tip and side port are below the diaphragm. No pneumothorax. There is atelectatic change in the right base with small right effusion. There is a calcified granuloma in the left upper lobe. Lungs elsewhere are clear. Heart is mildly enlarged with pulmonary vascularity within normal limits.  IMPRESSION: Tube and catheter positions as described without pneumothorax. Note that the tip of the central catheter is now at the cavoatrial junction. No pneumothorax. Calcified granuloma left upper lobe, stable. Atelectasis right base with small right effusion. No new opacity. No change in cardiac silhouette.   Electronically Signed   By: Lowella Grip III M.D.   On: 06/14/2015 12:22   Dg Chest Port 1 View  06/14/2015   CLINICAL DATA:  69 year old female PICC line placement and intubated. Earlier shortness of Breath. Initial encounter.  EXAM: PORTABLE CHEST 1 VIEW  COMPARISON:  0603 hours today, and earlier.  FINDINGS: Portable AP upright view at 1207 hours. Endotracheal tube tip in good position between the level the clavicles and carina. Enteric tube placed and courses to the left upper quadrant, tip not included. Right upper extremity approach PICC line catheter tip terminates about 1 cm below the cavoatrial junction level. Stable cardiac size and mediastinal contours. Bilateral pleural effusions have not significantly changed. No acute pulmonary edema. No pneumothorax.  IMPRESSION: 1. Right PICC line catheter tip projects about 1 cm below the cavoatrial junction level. 2. Endotracheal tube in good position. Enteric tube courses to the abdomen. 3. Stable bilateral pleural effusions.    Electronically Signed   By: Genevie Ann M.D.   On: 06/14/2015 12:17   Dg Abd Portable 1v  06/14/2015   CLINICAL DATA:  69 year old female enteric tube placement. Initial encounter.  EXAM: PORTABLE ABDOMEN - 1 VIEW  COMPARISON:  CT Abdomen and Pelvis 05/21/2015.  FINDINGS: Moderate-sized gastric hiatal hernia demonstrated on the CT comparison.  Portable AP upright view at 1208 hrs. Enteric tube loops in the stomach. The tip is directed retrograde at the level of the diaphragm. However, the side hole is below the diaphragm at the level of the mid to distal gastric body when accounting for the hiatal hernia. Placement therefore considered satisfactory.  Paucity of bowel gas. Dextro convex spinal scoliosis with widespread degenerative osseous changes.  IMPRESSION: 1. Satisfactory enteric tube placement when considering presence of hiatal hernia as above. 2. Non obstructed bowel gas pattern.   Electronically Signed   By: Genevie Ann M.D.   On: 06/14/2015 12:19     Medications:   . sodium chloride 10 mL/hr at  06/15/15 0700  . propofol (DIPRIVAN) infusion 10 mcg/kg/min (06/16/15 0431)   . ampicillin-sulbactam (UNASYN) IV  1.5 g Intravenous Q12H  . antiseptic oral rinse  7 mL Mouth Rinse QID  . carvedilol  3.125 mg Per Tube BID WC  . chlorhexidine gluconate  15 mL Mouth Rinse BID  . feeding supplement (VITAL HIGH PROTEIN)  1,000 mL Per Tube Q24H  . fentaNYL (SUBLIMAZE) injection  50 mcg Intravenous Once  . free water  50 mL Per Tube Q4H  . furosemide  40 mg Intravenous Q12H  . heparin  5,000 Units Subcutaneous Q12H  . hydrALAZINE  50 mg Per Tube TID  . insulin aspart  0-9 Units Subcutaneous 6 times per day  . lamoTRIgine  100 mg Per Tube BID  . latanoprost  1 drop Both Eyes QHS  . nystatin   Topical TID  . pantoprazole sodium  40 mg Per Tube BID  . potassium chloride  20 mEq Per Tube BID  . pyridostigmine  60 mg Per Tube TID  . sodium chloride  3 mL Intravenous Q12H  . spironolactone  25 mg Per Tube  Daily  . vancomycin  750 mg Intravenous Q24H   albuterol, fentaNYL (SUBLIMAZE) injection, guaiFENesin, ipratropium-albuterol, ondansetron **OR** ondansetron (ZOFRAN) IV, senna  Assessment/ Plan:  Ms. Annasophia Crocker is a 69 y.o. white female with coronary artery disease, atrial fibrillation, hypertension, hyperlipidemia, diabetes mellitus type II, COPD, tremor, systolic congestive heart failure, depression, anxiety, dementia, bipolar disease who was admitted on 06/08/2015 for Hyperkalemia [E87.5] Acute renal insufficiency [N28.9] Acute congestive heart failure, unspecified congestive heart failure type ([place]) [I50.9]  1. 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 However now patient's creatinine has remained stable ~1.7.  Currently on furosemide 40mg  IV q12  Chronic Kidney Disease with proteinuria secondary to diabetic nephropathy and renal artery stenosis. Left kidney atrophy on CT 05/21/2015.  - Will need outpatient renal dopplers.  - Renally dose all medications - potassium now at goal.   2. Hypernatremia: free water deficit of 1.8 litres. however with pulmonary edema. Due to furosemide. -  furosemide to 40mg  IV q12 - free water flushes - increase to 158mL q4   3. Renal mass: right: seen on CT. Ultrasound found these to be simple cysts.   4. Acute exacerbation of systolic congestive heart failure: continue to monitor volume status - continue furosemide - as above.  - continue spironolactone    LOS: [place], [place] 10/8/20168:46 AM

## 2015-06-17 DIAGNOSIS — N179 Acute kidney failure, unspecified: Secondary | ICD-10-CM

## 2015-06-17 DIAGNOSIS — J439 Emphysema, unspecified: Secondary | ICD-10-CM

## 2015-06-17 DIAGNOSIS — I5021 Acute systolic (congestive) heart failure: Secondary | ICD-10-CM

## 2015-06-17 LAB — GLUCOSE, CAPILLARY
GLUCOSE-CAPILLARY: 262 mg/dL — AB (ref 65–99)
GLUCOSE-CAPILLARY: 297 mg/dL — AB (ref 65–99)
GLUCOSE-CAPILLARY: 291 mg/dL — AB (ref 65–99)
Glucose-Capillary: 295 mg/dL — ABNORMAL HIGH (ref 65–99)
Glucose-Capillary: 302 mg/dL — ABNORMAL HIGH (ref 65–99)

## 2015-06-17 LAB — HEPARIN LEVEL (UNFRACTIONATED)
Heparin Unfractionated: 0.27 IU/mL — ABNORMAL LOW (ref 0.30–0.70)
Heparin Unfractionated: 0.47 IU/mL (ref 0.30–0.70)
Heparin Unfractionated: 0.46 IU/mL (ref 0.30–0.70)

## 2015-06-17 LAB — BASIC METABOLIC PANEL
Anion gap: 8 (ref 5–15)
BUN: 68 mg/dL — AB (ref 6–20)
CHLORIDE: 104 mmol/L (ref 101–111)
CO2: 33 mmol/L — ABNORMAL HIGH (ref 22–32)
Calcium: 8.8 mg/dL — ABNORMAL LOW (ref 8.9–10.3)
Creatinine, Ser: 1.73 mg/dL — ABNORMAL HIGH (ref 0.44–1.00)
GFR calc Af Amer: 33 mL/min — ABNORMAL LOW (ref >60)
GFR calc non Af Amer: 29 mL/min — ABNORMAL LOW (ref >60)
GLUCOSE: 335 mg/dL — AB (ref 65–99)
POTASSIUM: 5 mmol/L (ref 3.5–5.1)
Sodium: 145 mmol/L (ref 135–145)

## 2015-06-17 LAB — C DIFFICILE QUICK SCREEN W PCR REFLEX
C DIFFICLE (CDIFF) ANTIGEN: NEGATIVE
C Diff interpretation: NEGATIVE
C Diff toxin: NEGATIVE

## 2015-06-17 LAB — CBC
HEMATOCRIT: 32 % — AB (ref 35.0–47.0)
HEMOGLOBIN: 9.7 g/dL — AB (ref 12.0–16.0)
MCH: 24.8 pg — AB (ref 26.0–34.0)
MCHC: 30.3 g/dL — ABNORMAL LOW (ref 32.0–36.0)
MCV: 82 fL (ref 80.0–100.0)
Platelets: 208 10*3/uL (ref 150–440)
RBC: 3.9 MIL/uL (ref 3.80–5.20)
RDW: 28.5 % — ABNORMAL HIGH (ref 11.5–14.5)
WBC: 13.8 10*3/uL — ABNORMAL HIGH (ref 3.6–11.0)

## 2015-06-17 LAB — TRIGLYCERIDES: Triglycerides: 288 mg/dL — ABNORMAL HIGH (ref <150)

## 2015-06-17 LAB — VANCOMYCIN, TROUGH: VANCOMYCIN TR: 27 ug/mL — AB (ref 10–20)

## 2015-06-17 MED ORDER — HEPARIN BOLUS VIA INFUSION
900.0000 [IU] | Freq: Once | INTRAVENOUS | Status: AC
  Administered 2015-06-17: 900 [IU] via INTRAVENOUS
  Filled 2015-06-17: qty 900

## 2015-06-17 MED ORDER — VANCOMYCIN HCL IN DEXTROSE 750-5 MG/150ML-% IV SOLN: 750.0000 mg | INTRAVENOUS | Status: DC

## 2015-06-17 NOTE — Progress Notes (Signed)
ANTIBIOTIC CONSULT NOTE - INITIAL  Pharmacy Consult for Unasyn/Vancomycin Indication: pneumonia  Allergies  Allergen Reactions  . Lactose Intolerance (Gi) Other (See Comments)    Reaction:  GI upset   . Lithium Other (See Comments)    Reaction:  Unknown   . Penicillins Rash    Patient Measurements: Height: 5\' 4"  (162.6 cm) Weight: 131 lb 2.8 oz (59.5 kg) IBW/kg (Calculated) : 54.7   Vital Signs: Temp: 99 F (37.2 C) (10/09 2000) Temp Source: Oral (10/09 2000) BP: 148/82 mmHg (10/09 2219) Pulse Rate: 86 (10/09 2100) Intake/Output from previous day: 10/08 0701 - 10/09 0700 In: 2213.9 [I.V.:513.9; NG/GT:1500; IV Piggyback:200] Out: 2710 [Urine:2700; Emesis/NG output:10] Intake/Output from this shift: Total I/O In: -  Out: 250 [Urine:250]  Labs:  Recent Labs  06/15/15 0400 06/16/15 0539 06/17/15 0041  WBC 11.3* 14.7* 13.8*  HGB 9.2* 9.1* 9.7*  PLT 216 208 208  CREATININE 1.73* 1.75* 1.73*   Estimated Creatinine Clearance: 25.8 mL/min (by C-G formula based on Cr of 1.73).  Recent Labs  06/17/15 2319  VANCOTROUGH 27*     Microbiology: Recent Results (from the past 720 hour(s))  Urine culture     Status: None   Collection Time: 05/20/15  9:23 PM  Result Value Ref Range Status   Specimen Description URINE, RANDOM  Final   Special Requests NONE  Final   Culture MULTIPLE SPECIES PRESENT, SUGGEST RECOLLECTION  Final   Report Status 05/22/2015 FINAL  Final  MRSA PCR Screening     Status: None   Collection Time: 05/21/15  3:53 AM  Result Value Ref Range Status   MRSA by PCR NEGATIVE NEGATIVE Final    Comment:        The GeneXpert MRSA Assay (FDA approved for NASAL specimens only), is one component of a comprehensive MRSA colonization surveillance program. It is not intended to diagnose MRSA infection nor to guide or monitor treatment for MRSA infections.   MRSA PCR Screening     Status: None   Collection Time: 06/09/15  5:26 AM  Result Value Ref  Range Status   MRSA by PCR NEGATIVE NEGATIVE Final    Comment:        The GeneXpert MRSA Assay (FDA approved for NASAL specimens only), is one component of a comprehensive MRSA colonization surveillance program. It is not intended to diagnose MRSA infection nor to guide or monitor treatment for MRSA infections.   C difficile quick scan w PCR reflex     Status: None   Collection Time: 06/17/15  8:36 AM  Result Value Ref Range Status   C Diff antigen NEGATIVE NEGATIVE Final   C Diff toxin NEGATIVE NEGATIVE Final   C Diff interpretation Negative for C. difficile  Final    Medical History: Past Medical History  Diagnosis Date  . Heart disease   . Atrial fibrillation ([place])     a. reported a-fib; b. unknown chronicity; c. dates back to 1970s; d. not on long term anticoagulation 2/2 no documented a-fib  . Hypertension   . High cholesterol   . Diabetes ([place])     borderline  . COPD (chronic obstructive pulmonary disease) ([place])   . Melanoma ([place])   . Kidney disorder   . Tremor   . Autoimmune disease ([place])   . Depressed   . Anxiety disorder   . Migraine   . Dementia   . Motion sickness   . Myasthenia gravis ([place])   . Bipolar disorder ([place])   .  Dementia   . Acute renal failure ([place])   . Near syncope     Medications:  Scheduled:  . ampicillin-sulbactam (UNASYN) IV  1.5 g Intravenous Q12H  . antiseptic oral rinse  7 mL Mouth Rinse QID  . carvedilol  3.125 mg Per Tube BID WC  . chlorhexidine gluconate  15 mL Mouth Rinse BID  . feeding supplement (VITAL HIGH PROTEIN)  1,000 mL Per Tube Q24H  . fentaNYL (SUBLIMAZE) injection  50 mcg Intravenous Once  . free water  100 mL Per Tube Q4H  . furosemide  40 mg Intravenous Q12H  . hydrALAZINE  50 mg Per Tube TID  . insulin aspart  0-9 Units Subcutaneous 6 times per day  . lamoTRIgine  100 mg Per Tube BID  . latanoprost  1 drop Both Eyes QHS  . methylPREDNISolone (SOLU-MEDROL) injection  40 mg Intravenous Q12H  . nystatin    Topical TID  . pantoprazole sodium  40 mg Per Tube BID  . pyridostigmine  60 mg Per Tube TID  . sodium chloride  3 mL Intravenous Q12H  . spironolactone  25 mg Per Tube Daily  . [START ON 06/18/2015] vancomycin  750 mg Intravenous Q36H   Infusions:  . sodium chloride 10 mL/hr at 06/15/15 0700  . heparin 1,100 Units/hr (06/17/15 1537)  . propofol (DIPRIVAN) infusion 5 mcg/kg/min (06/17/15 1817)   PRN: albuterol, fentaNYL (SUBLIMAZE) injection, guaiFENesin, ipratropium-albuterol, ondansetron **OR** ondansetron (ZOFRAN) IV, senna  Assessment:    Pharmacy consulted to dose vancomycin and Unasyn for 69 yo female re-intubated previously ordered levofloxacin.   Goal of Therapy:  Resolution of infection  Plan:  1. Unasyn: Will continue Unasyn 1.5g IV Q12hr. Patient has listed allergy to PCN of rash. Spoke with RN and asked that she monitor for signs and symptoms.   2. Vancomycin: Will continue patient on vancomycin 750mg  IV Q24hr for goal trough of 15-20. Will obtain trough prior to dose on 10/9.    Pharmacy will continue to monitor and adjust per consult.    10/9 vanc trough 27. Changed to q 36 hours . Level ordered before next dose is due to evaluate clearance.   Brittiny Levitz S 06/17/2015,11:51 PM

## 2015-06-17 NOTE — Progress Notes (Signed)
[facility] Consultation     ASSESSMENT/PLAN   Acute respiratory failure secondary to volume overload from congestive heart failure as well as aspiration pneumonia with hiatal hernia. Patient was extubated on 06/12/2015. Course complicated by myasthenia gravis, mental and psych issues. Reintubated 06/14/2015 due to aspiration pneumonia  with worsening respiratory status, poor secretion clearance, mental status impairment.Patient still now with lip and tongue swelling  Awaiting for family to arrive from out of town to discuss plan and goals of care on a patient with recurrent resp failure  PULMONARY  A: Acute hypoxic respiratory failure with pulmonary edema. reintubated due to aspiration pneumonia. -Aspiration pneumonia with acute respiratory failure. Now ventilator dependent. Chest x-ray report and images from 06/13/2015 reviewed as well. As CT image and report report of the abdomen from number 08/28/2015. Together these images show evidence of a large hiatal hernia as well as intrathoracic stomach and the most recent chest x-ray images consistent with aspiration pneumonia. -Chronic hypercapnic respiratory failure with hypoventilation. -COPD -Nicotine abuse -Poor airway clearance, likely due to myasthenia gravis. -patient with lip and tongue swelling P:   -Continue vent support, weaning may be difficult as patient has poor airway toileting and would be at high risk of reintubation. -Continue antibiotics for anaerobic coverage and vancomycin. -Currently on nicotine patch. -start Iv steroids today  CARDIOVASCULAR  A: Acute systolic congestive heart failure. Atrial fibrillation. P:  Continue diuresis per cardiology and nephrology recommendations.  RENAL A:  Acute kidney injury P:   Nephrology following  GASTROINTESTINAL A: History of GI bleed, on recent admission. P:   Continue the patient on Protonix. Monitor hemoglobins periodically. -. Recent GI  bleed.  subcutaneous heparin from every  12 hours.   HEMATOLOGIC A:  Chronic anemia P:    INFECTIOUS -Pneumonia -obtain Sputum culture  MICRO DATA: MRSA PCR negative Urine - Blood- Resp -  ENDOCRINE Blood sugars adequately controlled at this time.  NEUROLOGIC A:  Myasthenia Gravis. History of bipolar disorder P:   -Continue Mestinon.  MAJOR EVENTS/TEST RESULTS: -Intubated 06/11/2015>>. Extubated 06/12/2015 -Reintubated 06/15/2015  INDWELLING DEVICES:: Urinary catheter placed 06/11/2015 PICC line placed RT arm 10/6    DVT prophylaxis: Subcutaneous heparin. Gastric intestinal prophylaxis: Protonix, IV     ---------------------------------------  ---------------------------------------   Name: Arnola Crittendon MRN: [id number] DOB: [birth year]    ADMISSION DATE:  06/08/2015 CONSULTATION DATE: 06/12/2015  REFERRING MD :  Dr. Earleen Newport.   CHIEF COMPLAINT:  Dyspnea   HISTORY OF PRESENT ILLNESS:   No acute events overnight, remains intubated,sedated Lips and tongue swelling, unable to wean from vent today-continue steroids     REVIEW OF SYSTEMS:   Patient is currently intubated and on a ventilator and cannot provide a review of systems; she is critically ill and on the ventilator.    VITAL SIGNS: Temp:  [98.7 F (37.1 C)-99.2 F (37.3 C)] 98.8 F (37.1 C) (10/09 0800) Pulse Rate:  [66-88] 80 (10/09 1000) Resp:  [18-25] 20 (10/09 1000) BP: (96-151)/(51-85) 146/85 mmHg (10/09 1000) SpO2:  [91 %-100 %] 98 % (10/09 1000) FiO2 (%):  [30 %-40 %] 30 % (10/09 1000) Weight:  [131 lb 2.8 oz (59.5 kg)] 131 lb 2.8 oz (59.5 kg) (10/09 0500) HEMODYNAMICS:   VENTILATOR SETTINGS: Vent Mode:  [-] PRVC FiO2 (%):  [30 %-40 %] 30 % Set Rate:  [18 bmp] 18 bmp Vt Set:  [450 mL] 450 mL PEEP:  [5 cmH20] 5 cmH20 Plateau Pressure:  [15 cmH20] 15 cmH20 INTAKE / OUTPUT:  Intake/Output Summary (Last 24 hours) at 06/17/15 1050 Last data filed at 06/17/15 1000  Gross  per 24 hour  Intake 2495.78 ml  Output   3200 ml  Net -704.22 ml    Physical Examination:   VS: BP 146/85 mmHg  Pulse 80  Temp(Src) 98.8 F (37.1 C) (Oral)  Resp 20  Ht 5\' 4"  (1.626 m)  Wt 131 lb 2.8 oz (59.5 kg)  BMI 22.50 kg/m2  SpO2 98%  General Appearance: No distress  Neuro:without focal findings, mental status reduced , sensation grossly normal  HEENT: PERRLA, EOM intact, no ptosis, no other lesions noticed;  Pulmonary: normal breath sounds., diaphragmatic excursion normal.   CardiovascularNormal S1,S2.  No m/r/g.    Abdomen: Benign, Soft, non-tender,  Renal:  No costovertebral tenderness  GU:  Not performed at this time. Endoc: No evident thyromegaly, no signs of acromegaly. Skin:   warm, no rashes, no ecchymosis  Extremities: normal, no cyanosis, clubbing,  GCS 8T LABS: Reviewed   LABORATORY PANEL:   CBC  Recent Labs Lab 06/17/15 0041  WBC 13.8*  HGB 9.7*  HCT 32.0*  PLT 208    Chemistries   Recent Labs Lab 06/12/15 0522  06/17/15 0041  NA 144  < > 145  K 3.8  < > 5.0  CL 104  < > 104  CO2 31  < > 33*  GLUCOSE 88  < > 335*  BUN 36*  < > 68*  CREATININE 1.69*  < > 1.73*  CALCIUM 8.6*  < > 8.8*  PHOS 2.8  --   --   < > = values in this interval not displayed.   Recent Labs Lab 06/16/15 1607 06/16/15 1639 06/16/15 1929 06/16/15 2325 06/17/15 0321 06/17/15 0722  GLUCAP 274* 264* 293* 285* 262* 297*    Recent Labs Lab 06/14/15 1252 06/15/15 0433 06/16/15 0431  PHART 7.51* 7.59* 7.57*  PCO2ART 50* 42 43  PO2ART 70* 62* 55*    Recent Labs Lab 06/12/15 0522  ALBUMIN 2.2*    Cardiac Enzymes No results for input(s): TROPONINI in the last 168 hours.  RADIOLOGY:  Dg Chest 1 View  06/16/2015   CLINICAL DATA:  Acute respiratory failure. Congestive heart failure. Aspiration pneumonia.  EXAM: CHEST 1 VIEW  COMPARISON:  Chest x-rays dated 06/15/2015, 06/14/2015 and 06/13/2015  FINDINGS: Endotracheal tube, right PICC and NG tube  appear in good position.  The infiltrate at the right lung base appears slightly improved. Infiltrate at the left base is essentially unchanged considering slight differences in patient rotation. Calcified granulomas in the mid left lung zone.  IMPRESSION: Slight improvement in the infiltrate at the right base. No change on the left.   Electronically Signed   By: Lorriane Shire M.D.   On: 06/16/2015 09:01   US Venous Img Upper Uni Right  06/16/2015   CLINICAL DATA:  Right upper extremity swelling.  Diabetes.  EXAM: RIGHT UPPER EXTREMITY VENOUS DOPPLER ULTRASOUND  TECHNIQUE: Gray-scale sonography with graded compression, as well as color Doppler and duplex ultrasound were performed to evaluate the upper extremity deep venous system from the level of the subclavian vein and including the jugular, axillary, basilic and upper cephalic vein. Spectral Doppler was utilized to evaluate flow at rest and with distal augmentation maneuvers.  COMPARISON:  None.  FINDINGS: Incompletely compressible thrombus in the right axillary, proximal brachial, and proximal basilic veins. Peripherally inserted central venous catheter is noted in the axillary vein and basilic vein in regions associated with thrombus.  Cephalic, radial, and ulnar veins unremarkable. Right IJ vein remains patent. Normal transmitted right atrial pulsations in the right subclavian vein with normal color Doppler flow.  Unremarkable limited images of the contralateral subclavian vein.  IMPRESSION: 1. Right basilic vein thrombophlebitis, right axillary and brachial DVT, associated with PICC line.   Electronically Signed   By: Lucrezia Europe M.D.   On: 06/16/2015 15:08     I have personally obtained a history, examined the patient, evaluated Pertinent laboratory and [employer]/imaging results, and  formulated the assessment and plan   The Patient requires high complexity decision making for assessment and support, frequent evaluation and titration of  therapies, application of advanced monitoring technologies and extensive interpretation of multiple databases. Critical Care Time devoted to patient care services described in this note is 35 minutes.   Overall, patient is critically ill, prognosis is guarded.     Corrin Parker, M.D.  [facility]  Medical Director [place] Director [facility] Cardio-Pulmonary Department

## 2015-06-17 NOTE — Consult Note (Signed)
ANTICOAGULATION CONSULT NOTE - Initial Consult  Pharmacy Consult for heparin Indication: DVT  Allergies  Allergen Reactions  . Lactose Intolerance (Gi) Other (See Comments)    Reaction:  GI upset   . Lithium Other (See Comments)    Reaction:  Unknown   . Penicillins Rash    Patient Measurements: Height: 5\' 4"  (162.6 cm) Weight: 131 lb 2.8 oz (59.5 kg) IBW/kg (Calculated) : 54.7 Heparin Dosing Weight: 60.1kg  Vital Signs: Temp: 98.2 F (36.8 C) (10/09 1605) Temp Source: Oral (10/09 1605) BP: 153/89 mmHg (10/09 1800) Pulse Rate: 85 (10/09 1800)  Labs:  Recent Labs  06/15/15 0400 06/16/15 0539 06/16/15 1710 06/17/15 0041 06/17/15 1205 06/17/15 2019  HGB 9.2* 9.1*  --  9.7*  --   --   HCT 30.3* 29.5*  --  32.0*  --   --   PLT 216 208  --  208  --   --   APTT  --   --  32  --   --   --   LABPROT  --   --  14.2  --   --   --   INR  --   --  1.08  --   --   --   HEPARINUNFRC  --   --   --  0.27* 0.47 0.46  CREATININE 1.73* 1.75*  --  1.73*  --   --     Estimated Creatinine Clearance: 25.8 mL/min (by C-G formula based on Cr of 1.73).   Medical History: Past Medical History  Diagnosis Date  . Heart disease   . Atrial fibrillation ([place])     a. reported a-fib; b. unknown chronicity; c. dates back to 1970s; d. not on long term anticoagulation 2/2 no documented a-fib  . Hypertension   . High cholesterol   . Diabetes ([place])     borderline  . COPD (chronic obstructive pulmonary disease) ([place])   . Melanoma ([place])   . Kidney disorder   . Tremor   . Autoimmune disease ([place])   . Depressed   . Anxiety disorder   . Migraine   . Dementia   . Motion sickness   . Myasthenia gravis ([place])   . Bipolar disorder ([place])   . Dementia   . Acute renal failure ([place])   . Near syncope     Medications:  Scheduled:  . ampicillin-sulbactam (UNASYN) IV  1.5 g Intravenous Q12H  . antiseptic oral rinse  7 mL Mouth Rinse QID  . carvedilol  3.125 mg Per Tube BID WC  . chlorhexidine  gluconate  15 mL Mouth Rinse BID  . feeding supplement (VITAL HIGH PROTEIN)  1,000 mL Per Tube Q24H  . fentaNYL (SUBLIMAZE) injection  50 mcg Intravenous Once  . free water  100 mL Per Tube Q4H  . furosemide  40 mg Intravenous Q12H  . hydrALAZINE  50 mg Per Tube TID  . insulin aspart  0-9 Units Subcutaneous 6 times per day  . lamoTRIgine  100 mg Per Tube BID  . latanoprost  1 drop Both Eyes QHS  . methylPREDNISolone (SOLU-MEDROL) injection  40 mg Intravenous Q12H  . nystatin   Topical TID  . pantoprazole sodium  40 mg Per Tube BID  . pyridostigmine  60 mg Per Tube TID  . sodium chloride  3 mL Intravenous Q12H  . spironolactone  25 mg Per Tube Daily  . vancomycin  750 mg Intravenous Q24H    Assessment: Pt is a 69 year  old women in the ICU. Doppler today showed right axillary and brachial DVT, associated with PICC line. Heparin drip ordered by Dr. Mortimer Fries. Per Dr. Mortimer Fries, no bolus. Pharmacy consulted to dose Goal of Therapy:  Heparin level 0.3-0.7 units/ml Monitor platelets by anticoagulation protocol: Yes   Plan:  Heparin level = 0.46 Heparin level is at goal so will continue heparin infusion at 1100 units/hr and check daily levels  Khianna Blazina D Juanita Devincent 06/17/2015,8:37 PM

## 2015-06-17 NOTE — Consult Note (Signed)
ANTICOAGULATION CONSULT NOTE - Initial Consult  Pharmacy Consult for heparin Indication: DVT  Allergies  Allergen Reactions  . Lactose Intolerance (Gi) Other (See Comments)    Reaction:  GI upset   . Lithium Other (See Comments)    Reaction:  Unknown   . Penicillins Rash    Patient Measurements: Height: 5\' 4"  (162.6 cm) Weight: 132 lb 7.9 oz (60.1 kg) IBW/kg (Calculated) : 54.7 Heparin Dosing Weight: 60.1kg  Vital Signs: Temp: 98.8 F (37.1 C) (10/09 0035) Temp Source: Oral (10/09 0035) BP: 119/66 mmHg (10/09 0000) Pulse Rate: 80 (10/09 0000)  Labs:  Recent Labs  06/15/15 0400 06/16/15 0539 06/16/15 1710 06/17/15 0041  HGB 9.2* 9.1*  --  9.7*  HCT 30.3* 29.5*  --  32.0*  PLT 216 208  --  208  APTT  --   --  32  --   LABPROT  --   --  14.2  --   INR  --   --  1.08  --   HEPARINUNFRC  --   --   --  0.27*  CREATININE 1.73* 1.75*  --  1.73*    Estimated Creatinine Clearance: 25.8 mL/min (by C-G formula based on Cr of 1.73).   Medical History: Past Medical History  Diagnosis Date  . Heart disease   . Atrial fibrillation ([place])     a. reported a-fib; b. unknown chronicity; c. dates back to 1970s; d. not on long term anticoagulation 2/2 no documented a-fib  . Hypertension   . High cholesterol   . Diabetes ([place])     borderline  . COPD (chronic obstructive pulmonary disease) ([place])   . Melanoma ([place])   . Kidney disorder   . Tremor   . Autoimmune disease ([place])   . Depressed   . Anxiety disorder   . Migraine   . Dementia   . Motion sickness   . Myasthenia gravis ([place])   . Bipolar disorder ([place])   . Dementia   . Acute renal failure ([place])   . Near syncope     Medications:  Scheduled:  . ampicillin-sulbactam (UNASYN) IV  1.5 g Intravenous Q12H  . antiseptic oral rinse  7 mL Mouth Rinse QID  . carvedilol  3.125 mg Per Tube BID WC  . chlorhexidine gluconate  15 mL Mouth Rinse BID  . feeding supplement (VITAL HIGH PROTEIN)  1,000 mL Per Tube Q24H  .  fentaNYL (SUBLIMAZE) injection  50 mcg Intravenous Once  . free water  100 mL Per Tube Q4H  . furosemide  40 mg Intravenous Q12H  . heparin  900 Units Intravenous Once  . hydrALAZINE  50 mg Per Tube TID  . insulin aspart  0-9 Units Subcutaneous 6 times per day  . lamoTRIgine  100 mg Per Tube BID  . latanoprost  1 drop Both Eyes QHS  . methylPREDNISolone (SOLU-MEDROL) injection  40 mg Intravenous Q12H  . nystatin   Topical TID  . pantoprazole sodium  40 mg Per Tube BID  . potassium chloride  20 mEq Per Tube BID  . pyridostigmine  60 mg Per Tube TID  . sodium chloride  3 mL Intravenous Q12H  . spironolactone  25 mg Per Tube Daily  . vancomycin  750 mg Intravenous Q24H    Assessment: Pt is a 69 year old women in the ICU. Doppler today showed right axillary and brachial DVT, associated with PICC line. Heparin drip ordered by Dr. Mortimer Fries. Per Dr. Mortimer Fries, no bolus. Pharmacy consulted  to dose Goal of Therapy:  Heparin level 0.3-0.7 units/ml Monitor platelets by anticoagulation protocol: Yes   Plan:  Start heparin infusion at 1000 units/hr Check anti-Xa level in 8 hours and daily while on heparin Continue to monitor H&H and platelets   10/09 01:00 heparin level 0.27. 900 unit bolus and increase to 1100 units/hr. Recheck in 8 hours.  Vondell Sowell S 06/17/2015,2:27 AM

## 2015-06-17 NOTE — Progress Notes (Signed)
[facility] [place] NAME: Mackenzie Rose    MR#:  [id number]  DATE OF BIRTH:  [birth year]  SUBJECTIVE:   No acute events overnight. Noted to have a right upper extremity DVT. Remains intubated and sedated.    Review of Systems  Unable to perform ROS: intubated   Tolerating Diet: npo, on TF Tolerating PT: intubated  DRUG ALLERGIES:   Allergies  Allergen Reactions  . Lactose Intolerance (Gi) Other (See Comments)    Reaction:  GI upset   . Lithium Other (See Comments)    Reaction:  Unknown   . Penicillins Rash    VITALS:  Blood pressure 141/58, pulse 81, temperature 98.3 F (36.8 C), temperature source Oral, resp. rate 21, height 5\' 4"  (1.626 m), weight 59.5 kg (131 lb 2.8 oz), SpO2 99 %.  PHYSICAL EXAMINATION:   Physical Exam  GENERAL:  69 y.o.-year-old patient lying in the bed Critically ill appearing EYES: Pupils sluggish but reactive to light. No scleral icterus. HEENT: Head atraumatic, normocephalic. Oropharynx and nasopharynx clear. ET & OG tube in place NECK:  Supple, no jugular venous distention. No thyroid enlargement, no tenderness.  LUNGS: no rhonchi, rales, wheezes. No use of accessory muscles of respiration.  CARDIOVASCULAR: S1, S2 normal. No murmurs, rubs, or gallops.  ABDOMEN: Soft, nontender, nondistended. Bowel sounds present. No organomegaly or mass.  EXTREMITIES: No cyanosis, clubbing, +1 edema bilaterally.  Right upper extremity edema and warmth. NEUROLOGIC: Sedated and intubated  PSYCHIATRIC: Sedated and intubated SKIN: right groin/thigh fungal rash lesion-improved remarkably   LABORATORY PANEL:   CBC  Recent Labs Lab 06/17/15 0041  WBC 13.8*  HGB 9.7*  HCT 32.0*  PLT 208    Chemistries   Recent Labs Lab 06/17/15 0041  NA 145  K 5.0  CL 104  CO2 33*  GLUCOSE 335*  BUN 68*  CREATININE 1.73*  CALCIUM 8.8*    Cardiac Enzymes No results for input(s): TROPONINI in the last 168  hours.  RADIOLOGY:  Dg Chest 1 View  06/16/2015   CLINICAL DATA:  Acute respiratory failure. Congestive heart failure. Aspiration pneumonia.  EXAM: CHEST 1 VIEW  COMPARISON:  Chest x-rays dated 06/15/2015, 06/14/2015 and 06/13/2015  FINDINGS: Endotracheal tube, right PICC and NG tube appear in good position.  The infiltrate at the right lung base appears slightly improved. Infiltrate at the left base is essentially unchanged considering slight differences in patient rotation. Calcified granulomas in the mid left lung zone.  IMPRESSION: Slight improvement in the infiltrate at the right base. No change on the left.   Electronically Signed   By: Lorriane Shire M.D.   On: 06/16/2015 09:01   US Venous Img Upper Uni Right  06/16/2015   CLINICAL DATA:  Right upper extremity swelling.  Diabetes.  EXAM: RIGHT UPPER EXTREMITY VENOUS DOPPLER ULTRASOUND  TECHNIQUE: Gray-scale sonography with graded compression, as well as color Doppler and duplex ultrasound were performed to evaluate the upper extremity deep venous system from the level of the subclavian vein and including the jugular, axillary, basilic and upper cephalic vein. Spectral Doppler was utilized to evaluate flow at rest and with distal augmentation maneuvers.  COMPARISON:  None.  FINDINGS: Incompletely compressible thrombus in the right axillary, proximal brachial, and proximal basilic veins. Peripherally inserted central venous catheter is noted in the axillary vein and basilic vein in regions associated with thrombus. Cephalic, radial, and ulnar veins unremarkable. Right IJ vein remains patent. Normal transmitted right atrial pulsations in  the right subclavian vein with normal color Doppler flow.  Unremarkable limited images of the contralateral subclavian vein.  IMPRESSION: 1. Right basilic vein thrombophlebitis, right axillary and brachial DVT, associated with PICC line.   Electronically Signed   By: Lucrezia Europe M.D.   On: 06/16/2015 15:08      ASSESSMENT AND PLAN:  69 y.o. female who presents with shortness of breath, worsening over the last 3-4 days. Patient states that she's also had some intermittent chest pains throughout the same time. She states that she's had some episodes of acute CHF in the past, but denies being chronically followed for CHF  1. acute hypoxic respiratory failure secondary to Acute systolic CHF (congestive heart failure) and Aspiration PNA -Patient was transferred to ICU given impending respiratory failure got intubated and on the ventilator October 3-->extubated 10/4-->re intubated October 6.  - cont. IV Lasix 40 mg twice a day. About 5.5 L (-) since admission.  -echo shows EF 35-40% (was 55-60% on July 2016) -was on IV clindamycin--->changed to IV Unasyn and vanc (october 6th) for aspiration pneumonia -Continue IV steroids as per pulmonary and plan for possible weaning tomorrow  2. Acute on Chronic RF (acute renal failure) CKD-III -cont. Diuresis and Cr. 1.7 and stable.  Baseline creat 1.15. -Renal ultrasound bilateral kidney cysts.  3. Myasthenia gravis - no acute issue. - cont. Pyridostigmine  4.HTN (hypertension)-stable  -continue coreg, hydralazine, spironolactone  5.Type 2 diabetes mellitus-blood sugar stable and continue sliding scale insulin   6.COPD (chronic obstructive pulmonary disease) - continue duo nebs.    7.h/o chronic afib -Rate controlled and continue on coreg.   8.Bipolar affective disorder, mixed - continue Lamictal  9. Right groin skin rash appears candidial-much improved -Continue Nystatin powder   10. Right upper extremity DVT-continue heparin drip.  Await palliative care input to discuss goals of care with family next week   CODE STATUS:FULL  DVT Prophylaxis: lovenox  TOTAL CRITICAL CARE TIME Spent in CARE OF THIS PATIENT: 30 minutes.    [name] M.D on 06/17/2015 at 9:03 AM  Between 7am to 6pm - Pager - [phone]  After 6pm go to  [email or web address] - password [email or web address] [facility]  Office  [phone]  CC: Primary care physician; Donato Schultz, MD

## 2015-06-17 NOTE — Progress Notes (Signed)
[facility]  ROUNDING NOTE   Subjective:   Na 145 (150) K 5 (4)  Creatinine 1.73 (1.75)  UOP 2700   Objective:  Vital signs in last 24 hours:  Temp:  [98.7 F (37.1 C)-99.2 F (37.3 C)] 98.8 F (37.1 C) (10/09 0330) Pulse Rate:  [66-92] 77 (10/09 0600) Resp:  [18-25] 20 (10/09 0600) BP: (96-139)/(51-97) 136/65 mmHg (10/09 0600) SpO2:  [91 %-100 %] 98 % (10/09 0600) FiO2 (%):  [30 %-40 %] 30 % (10/09 0829) Weight:  [59.5 kg (131 lb 2.8 oz)] 59.5 kg (131 lb 2.8 oz) (10/09 0500)  Weight change: -0.6 kg (-1 lb 5.2 oz) Filed Weights   06/15/15 0428 06/16/15 0409 06/17/15 0500  Weight: 59.3 kg (130 lb 11.7 oz) 60.1 kg (132 lb 7.9 oz) 59.5 kg (131 lb 2.8 oz)    Intake/Output: I/O last 3 completed shifts: In: 3077.5 [I.V.:677.5; NG/GT:2000; IV Piggyback:400] Out: 5462 [Urine:4225; Emesis/NG output:10]   Intake/Output this shift:  Total I/O In: 8.8 [I.V.:8.8] Out: -   Physical Exam: General: Critically ill, intubated, sedated  Head: Normocephalic, atraumatic, +ETT  Eyes: Anicteric, PERRL  Neck: Supple, trachea midline  Lungs:  Bilateral crackles, mechanical ventilation PRVC 35%  Heart: Regular rate and rhythm  Abdomen:  Soft, nontender  Extremities: 1+ peripheral edema.  Neurologic: sedated  Skin: No lesions  GU Foley with urine    Basic Metabolic Panel:  Recent Labs Lab 06/12/15 0522 06/13/15 0448 06/14/15 0409 06/15/15 0400 06/16/15 0539 06/17/15 0041  NA 144 146* 149* 151* 150* 145  K 3.8 3.7 3.7 3.4* 4.0 5.0  CL 104 101 100* 105 105 104  CO2 31 31 35* 37* 37* 33*  GLUCOSE 88 103* 153* 147* 200* 335*  BUN 36* 42* 44* 52* 58* 68*  CREATININE 1.69* 1.71* 1.78* 1.73* 1.75* 1.73*  CALCIUM 8.6* 9.4 9.8 9.0 8.8* 8.8*  PHOS 2.8  --   --   --   --   --     Liver Function Tests:  Recent Labs Lab 06/12/15 0522  ALBUMIN 2.2*   No results for input(s): LIPASE, AMYLASE in the last 168 hours. No results for input(s): AMMONIA in the last  168 hours.  CBC:  Recent Labs Lab 06/13/15 0448 06/14/15 0409 06/15/15 0400 06/16/15 0539 06/17/15 0041  WBC 10.3 10.3 11.3* 14.7* 13.8*  HGB 10.3* 10.6* 9.2* 9.1* 9.7*  HCT 33.8* 34.1* 30.3* 29.5* 32.0*  MCV 82.8 82.5 81.8 81.0 82.0  PLT 235 271 216 208 208    Cardiac Enzymes: No results for input(s): CKTOTAL, CKMB, CKMBINDEX, TROPONINI in the last 168 hours.  BNP: Invalid input(s): POCBNP  CBG:  Recent Labs Lab 06/16/15 1639 06/16/15 1929 06/16/15 2325 06/17/15 0321 06/17/15 0722  GLUCAP 264* 293* 285* 262* 42*    Microbiology: Results for orders placed or performed during the hospital encounter of 06/08/15  MRSA PCR Screening     Status: None   Collection Time: 06/09/15  5:26 AM  Result Value Ref Range Status   MRSA by PCR NEGATIVE NEGATIVE Final    Comment:        The GeneXpert MRSA Assay (FDA approved for NASAL specimens only), is one component of a comprehensive MRSA colonization surveillance program. It is not intended to diagnose MRSA infection nor to guide or monitor treatment for MRSA infections.     Coagulation Studies:  Recent Labs  06/16/15 1710  LABPROT 14.2  INR 1.08    Urinalysis: No results for input(s): COLORURINE, LABSPEC,  PHURINE, GLUCOSEU, HGBUR, BILIRUBINUR, KETONESUR, PROTEINUR, UROBILINOGEN, NITRITE, LEUKOCYTESUR in the last 72 hours.  Invalid input(s): APPERANCEUR    Imaging: Dg Chest 1 View  06/16/2015   CLINICAL DATA:  Acute respiratory failure. Congestive heart failure. Aspiration pneumonia.  EXAM: CHEST 1 VIEW  COMPARISON:  Chest x-rays dated 06/15/2015, 06/14/2015 and 06/13/2015  FINDINGS: Endotracheal tube, right PICC and NG tube appear in good position.  The infiltrate at the right lung base appears slightly improved. Infiltrate at the left base is essentially unchanged considering slight differences in patient rotation. Calcified granulomas in the mid left lung zone.  IMPRESSION: Slight improvement in the  infiltrate at the right base. No change on the left.   Electronically Signed   By: Lorriane Shire M.D.   On: 06/16/2015 09:01   US Venous Img Upper Uni Right  06/16/2015   CLINICAL DATA:  Right upper extremity swelling.  Diabetes.  EXAM: RIGHT UPPER EXTREMITY VENOUS DOPPLER ULTRASOUND  TECHNIQUE: Gray-scale sonography with graded compression, as well as color Doppler and duplex ultrasound were performed to evaluate the upper extremity deep venous system from the level of the subclavian vein and including the jugular, axillary, basilic and upper cephalic vein. Spectral Doppler was utilized to evaluate flow at rest and with distal augmentation maneuvers.  COMPARISON:  None.  FINDINGS: Incompletely compressible thrombus in the right axillary, proximal brachial, and proximal basilic veins. Peripherally inserted central venous catheter is noted in the axillary vein and basilic vein in regions associated with thrombus. Cephalic, radial, and ulnar veins unremarkable. Right IJ vein remains patent. Normal transmitted right atrial pulsations in the right subclavian vein with normal color Doppler flow.  Unremarkable limited images of the contralateral subclavian vein.  IMPRESSION: 1. Right basilic vein thrombophlebitis, right axillary and brachial DVT, associated with PICC line.   Electronically Signed   By: Lucrezia Europe M.D.   On: 06/16/2015 15:08     Medications:   . sodium chloride 10 mL/hr at 06/15/15 0700  . heparin 1,100 Units/hr (06/17/15 0258)  . propofol (DIPRIVAN) infusion Stopped (06/17/15 0815)   . ampicillin-sulbactam (UNASYN) IV  1.5 g Intravenous Q12H  . antiseptic oral rinse  7 mL Mouth Rinse QID  . carvedilol  3.125 mg Per Tube BID WC  . chlorhexidine gluconate  15 mL Mouth Rinse BID  . feeding supplement (VITAL HIGH PROTEIN)  1,000 mL Per Tube Q24H  . fentaNYL (SUBLIMAZE) injection  50 mcg Intravenous Once  . free water  100 mL Per Tube Q4H  . furosemide  40 mg Intravenous Q12H  .  hydrALAZINE  50 mg Per Tube TID  . insulin aspart  0-9 Units Subcutaneous 6 times per day  . lamoTRIgine  100 mg Per Tube BID  . latanoprost  1 drop Both Eyes QHS  . methylPREDNISolone (SOLU-MEDROL) injection  40 mg Intravenous Q12H  . nystatin   Topical TID  . pantoprazole sodium  40 mg Per Tube BID  . potassium chloride  20 mEq Per Tube BID  . pyridostigmine  60 mg Per Tube TID  . sodium chloride  3 mL Intravenous Q12H  . spironolactone  25 mg Per Tube Daily  . vancomycin  750 mg Intravenous Q24H   albuterol, fentaNYL (SUBLIMAZE) injection, guaiFENesin, ipratropium-albuterol, ondansetron **OR** ondansetron (ZOFRAN) IV, senna  Assessment/ Plan:  [name] is a 69 y.o. white female with coronary artery disease, atrial fibrillation, hypertension, hyperlipidemia, diabetes mellitus type II, COPD, tremor, systolic congestive heart failure, depression, anxiety, dementia, bipolar disease  who was admitted on 06/08/2015 for Hyperkalemia [E87.5] Acute renal insufficiency [N28.9] Acute congestive heart failure, unspecified congestive heart failure type ([employer]) [I50.9]  1. 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 However now patient's creatinine has remained stable ~1.7.  Currently on furosemide 40mg  IV q12  Chronic Kidney Disease with proteinuria secondary to diabetic nephropathy and renal artery stenosis. Left kidney atrophy on CT 05/21/2015.  - Will need outpatient renal dopplers.  - Renally dose all medications - potassium up to 5. Discontinue potassium supplements.   2. Hypernatremia: free water flushes 152mL q4. Secondary to furosemide to 40mg  IV q12  3. Renal mass: right: seen on CT. Ultrasound found these to be simple cysts.   4. Acute exacerbation of systolic congestive heart failure: continue to monitor volume status - continue furosemide - as above.  -  continue spironolactone    LOS: Riddle, [place] 10/9/20169:34 AM

## 2015-06-17 NOTE — Consult Note (Signed)
ANTICOAGULATION CONSULT NOTE - Initial Consult  Pharmacy Consult for heparin Indication: DVT  Allergies  Allergen Reactions  . Lactose Intolerance (Gi) Other (See Comments)    Reaction:  GI upset   . Lithium Other (See Comments)    Reaction:  Unknown   . Penicillins Rash    Patient Measurements: Height: 5\' 4"  (162.6 cm) Weight: 131 lb 2.8 oz (59.5 kg) IBW/kg (Calculated) : 54.7 Heparin Dosing Weight: 60.1kg  Vital Signs: Temp: 98.3 F (36.8 C) (10/09 1214) Temp Source: Oral (10/09 1214) BP: 141/58 mmHg (10/09 1100) Pulse Rate: 81 (10/09 1100)  Labs:  Recent Labs  06/15/15 0400 06/16/15 0539 06/16/15 1710 06/17/15 0041 06/17/15 1205  HGB 9.2* 9.1*  --  9.7*  --   HCT 30.3* 29.5*  --  32.0*  --   PLT 216 208  --  208  --   APTT  --   --  32  --   --   LABPROT  --   --  14.2  --   --   INR  --   --  1.08  --   --   HEPARINUNFRC  --   --   --  0.27* 0.47  CREATININE 1.73* 1.75*  --  1.73*  --     Estimated Creatinine Clearance: 25.8 mL/min (by C-G formula based on Cr of 1.73).   Medical History: Past Medical History  Diagnosis Date  . Heart disease   . Atrial fibrillation ([place])     a. reported a-fib; b. unknown chronicity; c. dates back to 1970s; d. not on long term anticoagulation 2/2 no documented a-fib  . Hypertension   . High cholesterol   . Diabetes ([place])     borderline  . COPD (chronic obstructive pulmonary disease) ([place])   . Melanoma ([place])   . Kidney disorder   . Tremor   . Autoimmune disease ([place])   . Depressed   . Anxiety disorder   . Migraine   . Dementia   . Motion sickness   . Myasthenia gravis ([place])   . Bipolar disorder ([place])   . Dementia   . Acute renal failure ([place])   . Near syncope     Medications:  Scheduled:  . ampicillin-sulbactam (UNASYN) IV  1.5 g Intravenous Q12H  . antiseptic oral rinse  7 mL Mouth Rinse QID  . carvedilol  3.125 mg Per Tube BID WC  . chlorhexidine gluconate  15 mL Mouth Rinse BID  . feeding  supplement (VITAL HIGH PROTEIN)  1,000 mL Per Tube Q24H  . fentaNYL (SUBLIMAZE) injection  50 mcg Intravenous Once  . free water  100 mL Per Tube Q4H  . furosemide  40 mg Intravenous Q12H  . hydrALAZINE  50 mg Per Tube TID  . insulin aspart  0-9 Units Subcutaneous 6 times per day  . lamoTRIgine  100 mg Per Tube BID  . latanoprost  1 drop Both Eyes QHS  . methylPREDNISolone (SOLU-MEDROL) injection  40 mg Intravenous Q12H  . nystatin   Topical TID  . pantoprazole sodium  40 mg Per Tube BID  . pyridostigmine  60 mg Per Tube TID  . sodium chloride  3 mL Intravenous Q12H  . spironolactone  25 mg Per Tube Daily  . vancomycin  750 mg Intravenous Q24H    Assessment: Pt is a 69 year old women in the ICU. Doppler today showed right axillary and brachial DVT, associated with PICC line. Heparin drip ordered by Dr. Mortimer Fries. Per  Dr. Mortimer Fries, no bolus. Pharmacy consulted to dose Goal of Therapy:  Heparin level 0.3-0.7 units/ml Monitor platelets by anticoagulation protocol: Yes   Plan:  Heparin level is at goal so will continue heparin infusion at 1100 units/hr and check a confirmatory level in 8 hours.   Ulice Dash D 06/17/2015,12:43 PM

## 2015-06-18 ENCOUNTER — Inpatient Hospital Stay: Payer: Medicare Other

## 2015-06-18 ENCOUNTER — Ambulatory Visit: Payer: Medicare Other | Admitting: Psychiatry

## 2015-06-18 DIAGNOSIS — J9601 Acute respiratory failure with hypoxia: Secondary | ICD-10-CM

## 2015-06-18 LAB — RENAL FUNCTION PANEL
ANION GAP: 8 (ref 5–15)
Albumin: 2.2 g/dL — ABNORMAL LOW (ref 3.5–5.0)
BUN: 80 mg/dL — ABNORMAL HIGH (ref 6–20)
CALCIUM: 8.8 mg/dL — AB (ref 8.9–10.3)
CHLORIDE: 106 mmol/L (ref 101–111)
CO2: 33 mmol/L — AB (ref 22–32)
Creatinine, Ser: 1.77 mg/dL — ABNORMAL HIGH (ref 0.44–1.00)
GFR calc non Af Amer: 28 mL/min — ABNORMAL LOW (ref >60)
GFR, EST AFRICAN AMERICAN: 32 mL/min — AB (ref >60)
GLUCOSE: 275 mg/dL — AB (ref 65–99)
POTASSIUM: 4.3 mmol/L (ref 3.5–5.1)
Phosphorus: 3.4 mg/dL (ref 2.5–4.6)
SODIUM: 147 mmol/L — AB (ref 135–145)

## 2015-06-18 LAB — BASIC METABOLIC PANEL
Anion gap: 11 (ref 5–15)
BUN: 87 mg/dL — AB (ref 6–20)
CHLORIDE: 105 mmol/L (ref 101–111)
CO2: 33 mmol/L — AB (ref 22–32)
CREATININE: 1.73 mg/dL — AB (ref 0.44–1.00)
Calcium: 9 mg/dL (ref 8.9–10.3)
GFR calc Af Amer: 33 mL/min — ABNORMAL LOW (ref >60)
GFR calc non Af Amer: 29 mL/min — ABNORMAL LOW (ref >60)
Glucose, Bld: 275 mg/dL — ABNORMAL HIGH (ref 65–99)
POTASSIUM: 4.4 mmol/L (ref 3.5–5.1)
SODIUM: 149 mmol/L — AB (ref 135–145)

## 2015-06-18 LAB — BLOOD GAS, ARTERIAL
Acid-Base Excess: 9.5 mmol/L — ABNORMAL HIGH (ref 0.0–3.0)
Bicarbonate: 34.8 mEq/L — ABNORMAL HIGH (ref 21.0–28.0)
FIO2: 0.44
O2 Saturation: 87.5 %
PATIENT TEMPERATURE: 37
pCO2 arterial: 50 mmHg — ABNORMAL HIGH (ref 32.0–48.0)
pH, Arterial: 7.45 (ref 7.350–7.450)
pO2, Arterial: 51 mmHg — ABNORMAL LOW (ref 83.0–108.0)

## 2015-06-18 LAB — CBC
HEMATOCRIT: 30.8 % — AB (ref 35.0–47.0)
HEMOGLOBIN: 9.6 g/dL — AB (ref 12.0–16.0)
MCH: 25.3 pg — AB (ref 26.0–34.0)
MCHC: 31.1 g/dL — AB (ref 32.0–36.0)
MCV: 81.4 fL (ref 80.0–100.0)
Platelets: 249 10*3/uL (ref 150–440)
RBC: 3.78 MIL/uL — AB (ref 3.80–5.20)
RDW: 28 % — ABNORMAL HIGH (ref 11.5–14.5)
WBC: 14.4 10*3/uL — ABNORMAL HIGH (ref 3.6–11.0)

## 2015-06-18 LAB — GLUCOSE, CAPILLARY
GLUCOSE-CAPILLARY: 263 mg/dL — AB (ref 65–99)
GLUCOSE-CAPILLARY: 150 mg/dL — AB (ref 65–99)
GLUCOSE-CAPILLARY: 137 mg/dL — AB (ref 65–99)
Glucose-Capillary: 229 mg/dL — ABNORMAL HIGH (ref 65–99)
Glucose-Capillary: 261 mg/dL — ABNORMAL HIGH (ref 65–99)
Glucose-Capillary: 125 mg/dL — ABNORMAL HIGH (ref 65–99)

## 2015-06-18 LAB — HEPARIN LEVEL (UNFRACTIONATED): HEPARIN UNFRACTIONATED: 0.53 [IU]/mL (ref 0.30–0.70)

## 2015-06-18 MED ORDER — VANCOMYCIN HCL IN DEXTROSE 750-5 MG/150ML-% IV SOLN: 750.0000 mg | INTRAVENOUS | Status: DC

## 2015-06-18 MED ORDER — LAMOTRIGINE 25 MG PO TABS
100.0000 mg | ORAL_TABLET | Freq: Two times a day (BID) | ORAL | Status: DC
  Administered 2015-06-19 – 2015-06-27 (×15): 100 mg via ORAL
  Filled 2015-06-18 (×5): qty 4
  Filled 2015-06-18: qty 1
  Filled 2015-06-18 (×4): qty 4
  Filled 2015-06-18: qty 1
  Filled 2015-06-18 (×4): qty 4

## 2015-06-18 MED ORDER — PYRIDOSTIGMINE BROMIDE 60 MG PO TABS: 60.0000 mg | ORAL_TABLET | Freq: Three times a day (TID) | ORAL | Status: DC

## 2015-06-18 MED ORDER — SPIRONOLACTONE 25 MG PO TABS
25.0000 mg | ORAL_TABLET | Freq: Every day | ORAL | Status: DC
  Administered 2015-06-19 – 2015-06-27 (×8): 25 mg via ORAL
  Filled 2015-06-18 (×8): qty 1

## 2015-06-18 MED ORDER — DEXTROSE 5 % IV SOLN
INTRAVENOUS | Status: DC
Start: 2015-06-18 — End: 2015-06-27
  Administered 2015-06-18 – 2015-06-21 (×4): via INTRAVENOUS
  Administered 2015-06-21: 05:00:00 500 mL via INTRAVENOUS
  Administered 2015-06-23 – 2015-06-26 (×5): via INTRAVENOUS

## 2015-06-18 MED ORDER — METOPROLOL TARTRATE 1 MG/ML IV SOLN
2.5000 mg | INTRAVENOUS | Status: DC | PRN
Start: 2015-06-18 — End: 2015-06-27
  Filled 2015-06-18: qty 5

## 2015-06-18 MED ORDER — PANTOPRAZOLE SODIUM 40 MG IV SOLR
40.0000 mg | INTRAVENOUS | Status: DC
  Administered 2015-06-18 – 2015-06-27 (×9): 40 mg via INTRAVENOUS
  Filled 2015-06-18 (×9): qty 40

## 2015-06-18 MED ORDER — FUROSEMIDE 20 MG PO TABS: 20.0000 mg | ORAL_TABLET | Freq: Every day | ORAL | Status: DC

## 2015-06-18 MED ORDER — METOPROLOL TARTRATE 1 MG/ML IV SOLN
2.5000 mg | Freq: Three times a day (TID) | INTRAVENOUS | Status: DC
  Administered 2015-06-18 – 2015-06-20 (×6): 2.5 mg via INTRAVENOUS
  Filled 2015-06-18 (×9): qty 5

## 2015-06-18 MED ORDER — HYDRALAZINE HCL 20 MG/ML IJ SOLN
10.0000 mg | Freq: Four times a day (QID) | INTRAMUSCULAR | Status: DC
  Administered 2015-06-18 – 2015-06-19 (×7): 10 mg via INTRAVENOUS
  Administered 2015-06-20: 18:00:00 via INTRAVENOUS
  Administered 2015-06-20 – 2015-06-21 (×4): 10 mg via INTRAVENOUS
  Filled 2015-06-18 (×12): qty 1

## 2015-06-18 MED ORDER — PYRIDOSTIGMINE BROMIDE 10 MG/2ML IV SOLN
10.0000 mg | Freq: Three times a day (TID) | INTRAVENOUS | Status: DC
  Administered 2015-06-18 – 2015-06-19 (×2): 10 mg via INTRAVENOUS
  Administered 2015-06-19: 5 mg via INTRAVENOUS
  Administered 2015-06-19 – 2015-06-27 (×21): 10 mg via INTRAVENOUS
  Filled 2015-06-18 (×39): qty 2

## 2015-06-18 NOTE — Progress Notes (Signed)
[facility] NAME: Mackenzie Rose    MR#:  [id number]  DATE OF BIRTH:  08-08-1946  SUBJECTIVE:   No acute events overnight. Noted to have a right upper extremity DVT.  Extubated now and 55% venti mask. Wet cough. weak    Review of Systems  Unable to perform ROS: mental acuity   Tolerating Diet: speech eval Tolerating PT: intubated  DRUG ALLERGIES:   Allergies  Allergen Reactions  . Lactose Intolerance (Gi) Other (See Comments)    Reaction:  GI upset   . Lithium Other (See Comments)    Reaction:  Unknown   . Penicillins Rash    Patient tolerated Ampicillin-Sulbactam     VITALS:  Blood pressure 132/69, pulse 90, temperature 98.3 F (36.8 C), temperature source Axillary, resp. rate 23, height 5\' 4"  (1.626 m), weight 58.4 kg (128 lb 12 oz), SpO2 95 %.  PHYSICAL EXAMINATION:   Physical Exam  GENERAL:  69 y.o.-year-old patient lying in the bed Critically ill appearing EYES: Pupils sluggish but reactive to light. No scleral icterus. HEENT: Head atraumatic, normocephalic. Oropharynx and nasopharynx clear.   NECK:  Supple, no jugular venous distention. No thyroid enlargement, no tenderness.  LUNGS: coarse wet  Breath sound no rhonchi or wheezes. No use of accessory muscles of respiration.  CARDIOVASCULAR: S1, S2 normal. No murmurs, rubs, or gallops.  ABDOMEN: Soft, nontender, nondistended. Bowel sounds present. No organomegaly or mass.  EXTREMITIES: No cyanosis, clubbing, +1 edema bilaterally.  Right upper extremity edema and warmth. NEUROLOGIC: weak overall able to move all extremities well PSYCHIATRIC: alert, oriented x2 SKIN: right groin/thigh fungal rash lesion-improved remarkably   LABORATORY PANEL:   CBC  Recent Labs Lab 06/18/15 0339  WBC 14.4*  HGB 9.6*  HCT 30.8*  PLT 249    Chemistries   Recent Labs Lab 06/18/15 0339  NA 147*  149*  K 4.3  4.4  CL 106  105  CO2 33*  33*  GLUCOSE  275*  275*  BUN 80*  87*  CREATININE 1.77*  1.73*  CALCIUM 8.8*  9.0    Cardiac Enzymes No results for input(s): TROPONINI in the last 168 hours.  RADIOLOGY:  US Venous Img Upper Uni Right  06/16/2015   CLINICAL DATA:  Right upper extremity swelling.  Diabetes.  EXAM: RIGHT UPPER EXTREMITY VENOUS DOPPLER ULTRASOUND  TECHNIQUE: Gray-scale sonography with graded compression, as well as color Doppler and duplex ultrasound were performed to evaluate the upper extremity deep venous system from the level of the subclavian vein and including the jugular, axillary, basilic and upper cephalic vein. Spectral Doppler was utilized to evaluate flow at rest and with distal augmentation maneuvers.  COMPARISON:  None.  FINDINGS: Incompletely compressible thrombus in the right axillary, proximal brachial, and proximal basilic veins. Peripherally inserted central venous catheter is noted in the axillary vein and basilic vein in regions associated with thrombus. Cephalic, radial, and ulnar veins unremarkable. Right IJ vein remains patent. Normal transmitted right atrial pulsations in the right subclavian vein with normal color Doppler flow.  Unremarkable limited images of the contralateral subclavian vein.  IMPRESSION: 1. Right basilic vein thrombophlebitis, right axillary and brachial DVT, associated with PICC line.   Electronically Signed   By: Lucrezia Europe M.D.   On: 06/16/2015 15:08     ASSESSMENT AND PLAN:  69 y.o. female who presents with shortness of breath, worsening over the last 3-4 days. Patient states that she's also had some  intermittent chest pains throughout the same time. She states that she's had some episodes of acute CHF in the past, but denies being chronically followed for CHF  1. acute hypoxic respiratory failure secondary to Acute systolic CHF (congestive heart failure) and Aspiration PNA -Patient was transferred to ICU given impending respiratory failure got intubated and on the ventilator  October 3-->extubated 10/4-->re intubated October 6.--->extubated oct 10th -was on IV Lasix 40 mg twice a day. About 5.5 L (-) since admission.  -echo shows EF 35-40% (was 55-60% on July 2016) -was on IV clindamycin--->changed to IV Unasyn (oct 6th )and d/ced vanc (october 10 th) for aspiration pneumonia -iv steroids and lasix d/ced by pulmonary -pt still wet will benefit from lasix.  2. Acute on Chronic RF (acute renal failure) CKD-III -cont. Diuresis and Cr. 1.7 and stable.  Baseline creat 1.15. -Renal ultrasound bilateral kidney cysts.  3. Myasthenia gravis - no acute issue. - cont. Pyridostigmine  4.HTN (hypertension)-stable  -continue coreg, hydralazine, spironolactone  5.Type 2 diabetes mellitus-blood sugar stable and continue sliding scale insulin   6.COPD (chronic obstructive pulmonary disease) - continue duo nebs.    7.h/o chronic afib -Rate controlled and continue on coreg.   8.Bipolar affective disorder, mixed - continue Lamictal  9. Right groin skin rash appears candidial-much improved -Continue Nystatin powder   10. Right upper extremity DVT-continue heparin drip.  Await palliative care input to discuss goals of care with family next week   CODE STATUS:FULL  DVT Prophylaxis: lovenox  TOTAL CRITICAL CARE TIME Spent in CARE OF THIS PATIENT: 30 minutes.    Donja Tipping M.D on 06/18/2015 at 9:03 AM  Between 7am to 6pm - Pager - [phone]  After 6pm go to [email or web address] - password [email or web address] [facility]  Office  [phone]  CC: Primary care physician; Donato Schultz, MD

## 2015-06-18 NOTE — Progress Notes (Signed)
ANTIBIOTIC CONSULT NOTE - FOLLOW UP  Pharmacy Consult for Unasyn Indication: Aspiration PNA  Allergies  Allergen Reactions  . Lactose Intolerance (Gi) Other (See Comments)    Reaction:  GI upset   . Lithium Other (See Comments)    Reaction:  Unknown   . Penicillins Rash    Patient tolerated Ampicillin-Sulbactam     Patient Measurements: Height: 5\' 4"  (162.6 cm) Weight: 128 lb 12 oz (58.4 kg) IBW/kg (Calculated) : 54.7   Vital Signs: Temp: 98.3 F (36.8 C) (10/10 1230) Temp Source: Axillary (10/10 1230) BP: 159/92 mmHg (10/10 1400) Pulse Rate: 82 (10/10 1400) Intake/Output from previous day: 10/09 0701 - 10/10 0700 In: 2632.8 [I.V.:487.8; NG/GT:1995; IV Piggyback:150] Out: 2905 [Urine:2905] Intake/Output from this shift: Total I/O In: 467.6 [I.V.:202.6; NG/GT:215; IV Piggyback:50] Out: 585 [Urine:585]  Labs:  Recent Labs  06/16/15 0539 06/17/15 0041 06/18/15 0339  WBC 14.7* 13.8* 14.4*  HGB 9.1* 9.7* 9.6*  PLT 208 208 249  CREATININE 1.75* 1.73* 1.77*  1.73*   Estimated Creatinine Clearance: 25.2 mL/min (by C-G formula based on Cr of 1.77).  Recent Labs  06/17/15 2319  VANCOTROUGH 27*     Microbiology: Recent Results (from the past 720 hour(s))  Urine culture     Status: None   Collection Time: 05/20/15  9:23 PM  Result Value Ref Range Status   Specimen Description URINE, RANDOM  Final   Special Requests NONE  Final   Culture MULTIPLE SPECIES PRESENT, SUGGEST RECOLLECTION  Final   Report Status 05/22/2015 FINAL  Final  MRSA PCR Screening     Status: None   Collection Time: 05/21/15  3:53 AM  Result Value Ref Range Status   MRSA by PCR NEGATIVE NEGATIVE Final    Comment:        The GeneXpert MRSA Assay (FDA approved for NASAL specimens only), is one component of a comprehensive MRSA colonization surveillance program. It is not intended to diagnose MRSA infection nor to guide or monitor treatment for MRSA infections.   MRSA PCR Screening      Status: None   Collection Time: 06/09/15  5:26 AM  Result Value Ref Range Status   MRSA by PCR NEGATIVE NEGATIVE Final    Comment:        The GeneXpert MRSA Assay (FDA approved for NASAL specimens only), is one component of a comprehensive MRSA colonization surveillance program. It is not intended to diagnose MRSA infection nor to guide or monitor treatment for MRSA infections.   C difficile quick scan w PCR reflex     Status: None   Collection Time: 06/17/15  8:36 AM  Result Value Ref Range Status   C Diff antigen NEGATIVE NEGATIVE Final   C Diff toxin NEGATIVE NEGATIVE Final   C Diff interpretation Negative for C. difficile  Final    Anti-infectives    Start     Dose/Rate Route Frequency Ordered Stop   06/18/15 1100  vancomycin (VANCOCIN) IVPB 750 mg/150 ml premix  Status:  Discontinued     750 mg 150 mL/hr over 60 Minutes Intravenous Every 36 hours 06/18/15 1047 06/18/15 1128   06/18/15 0000  vancomycin (VANCOCIN) IVPB 750 mg/150 ml premix  Status:  Discontinued     750 mg 150 mL/hr over 60 Minutes Intravenous Every 36 hours 06/17/15 2349 06/18/15 1047   06/14/15 2330  vancomycin (VANCOCIN) IVPB 750 mg/150 ml premix  Status:  Discontinued     750 mg 150 mL/hr over 60 Minutes Intravenous Every 24  hours 06/14/15 1430 06/17/15 2349   06/14/15 1500  ampicillin-sulbactam (UNASYN) 1.5 g in sodium chloride 0.9 % 50 mL IVPB     1.5 g 100 mL/hr over 30 Minutes Intravenous Every 12 hours 06/14/15 1419     06/14/15 1415  vancomycin (VANCOCIN) IVPB 1000 mg/200 mL premix     1,000 mg 200 mL/hr over 60 Minutes Intravenous  Once 06/14/15 1408 06/14/15 1630   06/13/15 1200  levofloxacin (LEVAQUIN) IVPB 750 mg  Status:  Discontinued     750 mg 100 mL/hr over 90 Minutes Intravenous Every 48 hours 06/13/15 1146 06/14/15 1408   06/11/15 2200  clindamycin (CLEOCIN) IVPB 300 mg  Status:  Discontinued     300 mg 100 mL/hr over 30 Minutes Intravenous 3 times per day 06/11/15 2010 06/13/15  1145   06/11/15 1100  fluconazole (DIFLUCAN) tablet 150 mg  Status:  Discontinued     150 mg Oral Daily 06/11/15 1040 06/12/15 1153      Assessment: 69 yo female being treated for aspiration pneumonia and acute hypoxic respiratory failure. Patients has worsening SOB over 3-4 days. Pharmacy consulted for dosing and monitoring of Unasyn. CrCl and Scr unchanged over past two days.  Patient is afebrile with WBC of 14.4.   Plan:  Will continue patient on current dose of Unasyn 1.5g every 12 hours. Pharmacy will continue to monitor renal function and labs and make adjustments as needed.  Nancy Fetter, PharmD Pharmacy Resident

## 2015-06-18 NOTE — Progress Notes (Signed)
[facility]  ROUNDING NOTE   Subjective:  Pt still on vent.  UOP was 2.9 liters over the past 24 hours.  Remains on lasix.     Objective:  Vital signs in last 24 hours:  Temp:  [98.2 F (36.8 C)-99.4 F (37.4 C)] 99.4 F (37.4 C) (10/10 0737) Pulse Rate:  [78-97] 90 (10/10 1000) Resp:  [14-30] 23 (10/10 1000) BP: (126-162)/(58-92) 132/69 mmHg (10/10 1000) SpO2:  [90 %-99 %] 97 % (10/10 1000) FiO2 (%):  [30 %] 30 % (10/10 1000) Weight:  [58.4 kg (128 lb 12 oz)] 58.4 kg (128 lb 12 oz) (10/10 0557)  Weight change: -1.1 kg (-2 lb 6.8 oz) Filed Weights   06/16/15 0409 06/17/15 0500 06/18/15 0557  Weight: 60.1 kg (132 lb 7.9 oz) 59.5 kg (131 lb 2.8 oz) 58.4 kg (128 lb 12 oz)    Intake/Output: I/O last 3 completed shifts: In: 3775.7 [I.V.:780.7; NG/GT:2695; IV Piggyback:300] Out: 4166 [Urine:3955]   Intake/Output this shift:  Total I/O In: 281.5 [I.V.:66.5; NG/GT:215] Out: 125 [Urine:125]  Physical Exam: General: Critically ill, intubated  Head: Normocephalic, atraumatic, +ETT  Eyes: Anicteric  Neck: Supple, trachea midline  Lungs:  Bilateral crackles, mechanical ventilation PRVC 30%  Heart: Regular rate and rhythm  Abdomen:  Soft, nontender, BS present  Extremities: Trace peripheral edema  Neurologic: Off sedation and following commands  Skin: No lesions  GU Foley with urine    Basic Metabolic Panel:  Recent Labs Lab 06/12/15 0522  06/14/15 0409 06/15/15 0400 06/16/15 0539 06/17/15 0041 06/18/15 0339  NA 144  < > 149* 151* 150* 145 147*  149*  K 3.8  < > 3.7 3.4* 4.0 5.0 4.3  4.4  CL 104  < > 100* 105 105 104 106  105  CO2 31  < > 35* 37* 37* 33* 33*  33*  GLUCOSE 88  < > 153* 147* 200* 335* 275*  275*  BUN 36*  < > 44* 52* 58* 68* 80*  87*  CREATININE 1.69*  < > 1.78* 1.73* 1.75* 1.73* 1.77*  1.73*  CALCIUM 8.6*  < > 9.8 9.0 8.8* 8.8* 8.8*  9.0  PHOS 2.8  --   --   --   --   --  3.4  < > = values in this interval not  displayed.  Liver Function Tests:  Recent Labs Lab 06/12/15 0522 06/18/15 0339  ALBUMIN 2.2* 2.2*   No results for input(s): LIPASE, AMYLASE in the last 168 hours. No results for input(s): AMMONIA in the last 168 hours.  CBC:  Recent Labs Lab 06/14/15 0409 06/15/15 0400 06/16/15 0539 06/17/15 0041 06/18/15 0339  WBC 10.3 11.3* 14.7* 13.8* 14.4*  HGB 10.6* 9.2* 9.1* 9.7* 9.6*  HCT 34.1* 30.3* 29.5* 32.0* 30.8*  MCV 82.5 81.8 81.0 82.0 81.4  PLT 271 216 208 208 249    Cardiac Enzymes: No results for input(s): CKTOTAL, CKMB, CKMBINDEX, TROPONINI in the last 168 hours.  BNP: Invalid input(s): POCBNP  CBG:  Recent Labs Lab 06/17/15 1553 06/17/15 1929 06/18/15 06/18/15 0347 06/18/15 0737  GLUCAP 295* 302* 229* 75* 70*    Microbiology: Results for orders placed or performed during the hospital encounter of 06/08/15  MRSA PCR Screening     Status: None   Collection Time: 06/09/15  5:26 AM  Result Value Ref Range Status   MRSA by PCR NEGATIVE NEGATIVE Final    Comment:        The GeneXpert MRSA Assay (FDA  approved for NASAL specimens only), is one component of a comprehensive MRSA colonization surveillance program. It is not intended to diagnose MRSA infection nor to guide or monitor treatment for MRSA infections.   C difficile quick scan w PCR reflex     Status: None   Collection Time: 06/17/15  8:36 AM  Result Value Ref Range Status   C Diff antigen NEGATIVE NEGATIVE Final   C Diff toxin NEGATIVE NEGATIVE Final   C Diff interpretation Negative for C. difficile  Final    Coagulation Studies:  Recent Labs  06/16/15 1710  LABPROT 14.2  INR 1.08    Urinalysis: No results for input(s): COLORURINE, LABSPEC, PHURINE, GLUCOSEU, HGBUR, BILIRUBINUR, KETONESUR, PROTEINUR, UROBILINOGEN, NITRITE, LEUKOCYTESUR in the last 72 hours.  Invalid input(s): APPERANCEUR    Imaging: US Venous Img Upper Uni Right  06/16/2015   CLINICAL DATA:  Right upper  extremity swelling.  Diabetes.  EXAM: RIGHT UPPER EXTREMITY VENOUS DOPPLER ULTRASOUND  TECHNIQUE: Gray-scale sonography with graded compression, as well as color Doppler and duplex ultrasound were performed to evaluate the upper extremity deep venous system from the level of the subclavian vein and including the jugular, axillary, basilic and upper cephalic vein. Spectral Doppler was utilized to evaluate flow at rest and with distal augmentation maneuvers.  COMPARISON:  None.  FINDINGS: Incompletely compressible thrombus in the right axillary, proximal brachial, and proximal basilic veins. Peripherally inserted central venous catheter is noted in the axillary vein and basilic vein in regions associated with thrombus. Cephalic, radial, and ulnar veins unremarkable. Right IJ vein remains patent. Normal transmitted right atrial pulsations in the right subclavian vein with normal color Doppler flow.  Unremarkable limited images of the contralateral subclavian vein.  IMPRESSION: 1. Right basilic vein thrombophlebitis, right axillary and brachial DVT, associated with PICC line.   Electronically Signed   By: Lucrezia Europe M.D.   On: 06/16/2015 15:08     Medications:   . sodium chloride 10 mL/hr at 06/15/15 0700  . heparin 1,100 Units/hr (06/17/15 1900)  . propofol (DIPRIVAN) infusion Stopped (06/18/15 0737)   . ampicillin-sulbactam (UNASYN) IV  1.5 g Intravenous Q12H  . antiseptic oral rinse  7 mL Mouth Rinse QID  . carvedilol  3.125 mg Per Tube BID WC  . chlorhexidine gluconate  15 mL Mouth Rinse BID  . feeding supplement (VITAL HIGH PROTEIN)  1,000 mL Per Tube Q24H  . fentaNYL (SUBLIMAZE) injection  50 mcg Intravenous Once  . free water  100 mL Per Tube Q4H  . furosemide  40 mg Intravenous Q12H  . hydrALAZINE  50 mg Per Tube TID  . insulin aspart  0-9 Units Subcutaneous 6 times per day  . lamoTRIgine  100 mg Per Tube BID  . latanoprost  1 drop Both Eyes QHS  . methylPREDNISolone (SOLU-MEDROL) injection   40 mg Intravenous Q12H  . nystatin   Topical TID  . pantoprazole sodium  40 mg Per Tube BID  . pyridostigmine  60 mg Per Tube TID  . sodium chloride  3 mL Intravenous Q12H  . spironolactone  25 mg Per Tube Daily  . vancomycin  750 mg Intravenous Q36H   albuterol, fentaNYL (SUBLIMAZE) injection, guaiFENesin, ipratropium-albuterol, ondansetron **OR** ondansetron (ZOFRAN) IV, senna  Assessment/ Plan:  [name] is a 69 y.o. white female with coronary artery disease, atrial fibrillation, hypertension, hyperlipidemia, diabetes mellitus type II, COPD, tremor, systolic congestive heart failure, depression, anxiety, dementia, bipolar disease who was admitted on 06/08/2015   1. Acute  Renal Failure with hyperkalemia on chronic kidney disease stage III with proteinuria: Originally admitted with acute renal failure with acute cardiorenal syndrome. Chronic Kidney Disease with proteinuria secondary to diabetic nephropathy and renal artery stenosis. Left kidney atrophy on CT 05/21/2015.  - Azotemia worsening but Cr relatively stable over the past several days.    2. Hypernatremia: na currently 147, was 149 prior to this, should improve as she as free access to water once extubated.  3. Renal mass: right: seen on CT. Ultrasound found these to be simple cysts.   4. Acute respiratory failure:  Remains on the vent though weaning on going, low fio2 at the moment. Currently on lasix 40mg  IV q12 hours, will continue this for now.   LOS: 9 Mackenzie Rose 10/10/201610:35 AM

## 2015-06-18 NOTE — Progress Notes (Signed)
RASS 0. + F/C. Diffusely weak but passed SBT. Extubated and looks OK. Cough is marginal  Filed Vitals:   06/18/15 1230 06/18/15 1300 06/18/15 1337 06/18/15 1400  BP:  156/91  159/92  Pulse:  93 93 82  Temp: 98.3 F (36.8 C)     TempSrc: Axillary     Resp:  22 24 26   Height:      Weight:      SpO2:  100% 100% 100%   NAD HEENT WNL No JVD Few scattered rhonchi Reg, no M NABS, soft No edema Diffusely weak, no focal deficits  BMP Latest Ref Rng 06/18/2015 06/18/2015 06/17/2015  Glucose 65 - 99 mg/dL 275(H) 275(H) 335(H)  BUN 6 - 20 mg/dL 87(H) 80(H) 68(H)  Creatinine 0.44 - 1.00 mg/dL 1.73(H) 1.77(H) 1.73(H)  Sodium 135 - 145 mmol/L 149(H) 147(H) 145  Potassium 3.5 - 5.1 mmol/L 4.4 4.3 5.0  Chloride 101 - 111 mmol/L 105 106 104  CO2 22 - 32 mmol/L 33(H) 33(H) 33(H)  Calcium 8.9 - 10.3 mg/dL 9.0 8.8(L) 8.8(L)    CBC Latest Ref Rng 06/18/2015 06/17/2015 06/16/2015  WBC 3.6 - 11.0 K/uL 14.4(H) 13.8(H) 14.7(H)  Hemoglobin 12.0 - 16.0 g/dL 9.6(L) 9.7(L) 9.1(L)  Hematocrit 35.0 - 47.0 % 30.8(L) 32.0(L) 29.5(L)  Platelets 150 - 440 K/uL 249 208 208    CXR: NNF  IMPRESSION: VDRF, S/P failed extubation - extubated today under my surveillance Marginal cough mechanics due to myasthenia Concern for aspiration PNA Atelctasis Systolic CHF (LVEF 09-32%) CKD, stage III Myasthenia gravis Concern for angioedema 10/09 - clinically resolved Chronic PPI use  PLAN/REC: NPO until SLP re-eval Place NGT for meds Monitor in ICU post extubation Monitor BMET intermittently Monitor I/Os Correct electrolytes as indicated D5W ordered 10/10 Follow CXR Follow off abx DC Vanc Cont amp-sulbactam DC systemic steroids Cont IV PPI  CCM time: 40 mins The above time includes time spent in consultation with patient and/or family members and reviewing care plan on multidisciplinary rounds  Merton Border, MD PCCM service Mobile [phone] Pager [phone]

## 2015-06-18 NOTE — Progress Notes (Signed)
Nutrition Follow-up    INTERVENTION:   Meals/Snacks: await diet progression post extubation Medical Food Supplement Therapy: recommend addition of nutritional supplement once diet advanced   NUTRITION DIAGNOSIS:   Inadequate oral intake related to acute illness as evidenced by NPO status. Continues  GOAL:   Patient will meet greater than or equal to 90% of their needs  MONITOR:    (Energy intake, Pulmonary profile, Digestive system)  REASON FOR ASSESSMENT:   Consult Enteral/tube feeding initiation and management  ASSESSMENT:   Pt s/p extubation this AM, currently on venti mask  Diet Order:   NPO  EN: TF discontinued with extubation  Electrolyte and Renal Profile:  Recent Labs Lab 06/12/15 0522  06/16/15 0539 06/17/15 0041 06/18/15 0339  BUN 36*  < > 58* 68* 80*  87*  CREATININE 1.69*  < > 1.75* 1.73* 1.77*  1.73*  NA 144  < > 150* 145 147*  149*  K 3.8  < > 4.0 5.0 4.3  4.4  PHOS 2.8  --   --   --  3.4  < > = values in this interval not displayed. Glucose Profile:   Recent Labs  06/18/15 0347 06/18/15 0737 06/18/15 1234  GLUCAP 261* 263* 150*   Meds: reviewed  Height:   Ht Readings from Last 1 Encounters:  06/11/15 5\' 4"  (1.626 m)    Weight:   Wt Readings from Last 1 Encounters:  06/18/15 128 lb 12 oz (58.4 kg)    Filed Weights   06/16/15 0409 06/17/15 0500 06/18/15 0557  Weight: 132 lb 7.9 oz (60.1 kg) 131 lb 2.8 oz (59.5 kg) 128 lb 12 oz (58.4 kg)    BMI:  Body mass index is 22.09 kg/(m^2).  Estimated Nutritional Needs:   Kcal:  1550-1831 kcals (BEE 1084, 1.3 AF, 1.1-1.3 IF)   Protein:  (1.2-2.0 g/kg) 78-130 g/d  Fluid:  25-14ml/kg) 1625-1939ml/d  EDUCATION NEEDS:   No education needs identified at this time  [place], [place], LDN [phone] Pager

## 2015-06-18 NOTE — Progress Notes (Signed)
Patient extubated today, patient has several medications ordered for evening administration.  Patient unable to take PO medications and throat is swollen.  Order received to hold NG tube placement and change medications to IV.  Mestinon changed to IV, Lamictal and Coreg unavailable in IV dosage. Will hold and continue to assess for changes/need.

## 2015-06-18 NOTE — Progress Notes (Signed)
Inpatient Diabetes Program Recommendations  AACE/ADA: New Consensus Statement on Inpatient Glycemic Control (2015)  Target Ranges:  Prepandial:   less than 140 mg/dL      Peak postprandial:   less than 180 mg/dL (1-2 hours)      Critically ill patients:  140 - 180 mg/dL   Review of Glycemic Control  Diabetes history: DM2 Outpatient Diabetes medications: Amaryl 1 mg BID Current orders for Inpatient glycemic control: Novolog 0-9 units Q4H  Inpatient Diabetes Program Recommendations:  Inpatient Glycemic Order Set: Since patient is in ICU, on ventilator, sedated, and on steroids MD may want to consider discontinuing current Glycemic Control order set and use the ICU Glycemic Control order set which will allow patient to be transitioned from SQ to IV insulin if needed to improve inpatient glycemic control. Insulin - Basal: Noted since steroids have been ordered glucose is elevated. Glucose ranged from 229-302 mg/dl over the past 24 hours.  Patient received a total of Novolog 32 units on 06/17/15 for correction and has already received a total of Novolog 13 units today for correction. While patient is ordered steroids, please consider ordering basal insulin. Recommend starting with Levemir 10 units Q24H starting now. Insulin -Tube Feeding Coverage: While patient is on steroids and tube feeding, please consider ordering Novolog 3 units Q4H for tube feeding coverage (in addition to Novolog correction scale). Tube feeding coverage should be held if TF on hold or if discontinued.  Thanks, Barnie Alderman, RN, MSN, CCRN, CDE Diabetes Coordinator Inpatient Diabetes Program [phone] (Team Pager from [place] to [place]) [phone] ([facility] office) [phone] [facility] office) [phone] [facility] office)

## 2015-06-18 NOTE — Care Management Note (Signed)
Case Management Note  Patient Details  Name: Krishauna Schatzman MRN: [id number] Date of Birth: 1945/12/12  Subjective/Objective:  Acute respiratory failure, Vented on spontaneous mode. Plan is to attempt extubation today.                  Action/Plan:   Expected Discharge Date:                  Expected Discharge Plan:     In-House Referral:  Clinical Social Work  Discharge planning Services  CM Consult  Post Acute Care Choice:    Choice offered to:     DME Arranged:    DME Agency:     HH Arranged:    HH Agency:     Status of Service:  In process, will continue to follow  Medicare Important Message Given:  Yes-second notification given Date Medicare IM Given:    Medicare IM give by:    Date Additional Medicare IM Given:    Additional Medicare Important Message give by:     If discussed at [place] of Stay Meetings, dates discussed:    Additional Comments:  Jolly Mango, RN 06/18/2015, 10:55 AM

## 2015-06-18 NOTE — Consult Note (Signed)
ANTICOAGULATION CONSULT NOTE - Initial Consult  Pharmacy Consult for heparin Indication: DVT  Allergies  Allergen Reactions  . Lactose Intolerance (Gi) Other (See Comments)    Reaction:  GI upset   . Lithium Other (See Comments)    Reaction:  Unknown   . Penicillins Rash    Patient Measurements: Height: 5\' 4"  (162.6 cm) Weight: 131 lb 2.8 oz (59.5 kg) IBW/kg (Calculated) : 54.7 Heparin Dosing Weight: 60.1kg  Vital Signs: Temp: 98.6 F (37 C) (10/10 0000) Temp Source: Oral (10/10 0000) BP: 137/76 mmHg (10/10 0300) Pulse Rate: 88 (10/10 0300)  Labs:  Recent Labs  06/16/15 0539 06/16/15 1710  06/17/15 0041 06/17/15 1205 06/17/15 2019 06/18/15 0339  HGB 9.1*  --   --  9.7*  --   --  9.6*  HCT 29.5*  --   --  32.0*  --   --  30.8*  PLT 208  --   --  208  --   --  249  APTT  --  32  --   --   --   --   --   LABPROT  --  14.2  --   --   --   --   --   INR  --  1.08  --   --   --   --   --   HEPARINUNFRC  --   --   < > 0.27* 0.47 0.46 0.53  CREATININE 1.75*  --   --  1.73*  --   --  1.77*  1.73*  < > = values in this interval not displayed.  Estimated Creatinine Clearance: 25.2 mL/min (by C-G formula based on Cr of 1.77).   Medical History: Past Medical History  Diagnosis Date  . Heart disease   . Atrial fibrillation ([place])     a. reported a-fib; b. unknown chronicity; c. dates back to 1970s; d. not on long term anticoagulation 2/2 no documented a-fib  . Hypertension   . High cholesterol   . Diabetes ([place])     borderline  . COPD (chronic obstructive pulmonary disease) ([place])   . Melanoma ([place])   . Kidney disorder   . Tremor   . Autoimmune disease ([place])   . Depressed   . Anxiety disorder   . Migraine   . Dementia   . Motion sickness   . Myasthenia gravis ([place])   . Bipolar disorder ([place])   . Dementia   . Acute renal failure ([place])   . Near syncope     Medications:  Scheduled:  . ampicillin-sulbactam (UNASYN) IV  1.5 g Intravenous Q12H  . antiseptic  oral rinse  7 mL Mouth Rinse QID  . carvedilol  3.125 mg Per Tube BID WC  . chlorhexidine gluconate  15 mL Mouth Rinse BID  . feeding supplement (VITAL HIGH PROTEIN)  1,000 mL Per Tube Q24H  . fentaNYL (SUBLIMAZE) injection  50 mcg Intravenous Once  . free water  100 mL Per Tube Q4H  . furosemide  40 mg Intravenous Q12H  . hydrALAZINE  50 mg Per Tube TID  . insulin aspart  0-9 Units Subcutaneous 6 times per day  . lamoTRIgine  100 mg Per Tube BID  . latanoprost  1 drop Both Eyes QHS  . methylPREDNISolone (SOLU-MEDROL) injection  40 mg Intravenous Q12H  . nystatin   Topical TID  . pantoprazole sodium  40 mg Per Tube BID  . pyridostigmine  60 mg Per Tube TID  .  sodium chloride  3 mL Intravenous Q12H  . spironolactone  25 mg Per Tube Daily  . vancomycin  750 mg Intravenous Q36H    Assessment: Pt is a 69 year old women in the ICU. Doppler today showed right axillary and brachial DVT, associated with PICC line. Heparin drip ordered by Dr. Mortimer Fries. Per Dr. Mortimer Fries, no bolus. Pharmacy consulted to dose Goal of Therapy:  Heparin level 0.3-0.7 units/ml Monitor platelets by anticoagulation protocol: Yes   Plan:  Heparin level = 0.46 Heparin level is at goal so will continue heparin infusion at 1100 units/hr and check daily levels  10/11 AM heparin level 0.53. Recheck with CBC tomorrow AM.  Tucker Minter S 06/18/2015,5:09 AM

## 2015-06-18 NOTE — Progress Notes (Signed)
Patient extubated by RT. First to nasal cannula and then as patient began to breathe through her mouth, a venturi mask at 55% FiO2. Patient with productive cough, RT obtained order for NT suction.

## 2015-06-18 NOTE — Progress Notes (Signed)
,   Dr. Alva Garnet had instructed RN verbally to place smallest size NG tube available for patient for medication administration. RN asked if he wanted a dobhoff tube placed and he said yes, if able. RN attempted to place dobhoff tube, first two attempts unsuccessful with respiratory distress, lowered O2 sats. Third attempt appeared to succeed but x-ray showed tube not in position so that tube withdrawn.

## 2015-06-18 NOTE — Clinical Social Work Note (Signed)
CSW has noted that the plan is for an attempt at extubation today. Patient from [facility] and to return to [facility] once off vent and stable. Shela Leff MSW,LCSW [phone]

## 2015-06-19 ENCOUNTER — Inpatient Hospital Stay: Payer: Medicare Other

## 2015-06-19 LAB — BASIC METABOLIC PANEL
Anion gap: 9 (ref 5–15)
BUN: 82 mg/dL — AB (ref 6–20)
CALCIUM: 9.3 mg/dL (ref 8.9–10.3)
CO2: 32 mmol/L (ref 22–32)
CREATININE: 1.62 mg/dL — AB (ref 0.44–1.00)
Chloride: 111 mmol/L (ref 101–111)
GFR calc non Af Amer: 31 mL/min — ABNORMAL LOW (ref >60)
GFR, EST AFRICAN AMERICAN: 36 mL/min — AB (ref >60)
Glucose, Bld: 137 mg/dL — ABNORMAL HIGH (ref 65–99)
Potassium: 3.9 mmol/L (ref 3.5–5.1)
SODIUM: 152 mmol/L — AB (ref 135–145)

## 2015-06-19 LAB — CBC
HCT: 36.8 % (ref 35.0–47.0)
Hemoglobin: 11 g/dL — ABNORMAL LOW (ref 12.0–16.0)
MCH: 24.7 pg — AB (ref 26.0–34.0)
MCHC: 30 g/dL — AB (ref 32.0–36.0)
MCV: 82.6 fL (ref 80.0–100.0)
Platelets: 329 10*3/uL (ref 150–440)
RBC: 4.45 MIL/uL (ref 3.80–5.20)
RDW: 27.9 % — AB (ref 11.5–14.5)
WBC: 16 10*3/uL — ABNORMAL HIGH (ref 3.6–11.0)

## 2015-06-19 LAB — HEPARIN LEVEL (UNFRACTIONATED)
HEPARIN UNFRACTIONATED: 0.85 [IU]/mL — AB (ref 0.30–0.70)
Heparin Unfractionated: 0.61 IU/mL (ref 0.30–0.70)
Heparin Unfractionated: 0.65 IU/mL (ref 0.30–0.70)

## 2015-06-19 LAB — GLUCOSE, CAPILLARY
GLUCOSE-CAPILLARY: 106 mg/dL — AB (ref 65–99)
GLUCOSE-CAPILLARY: 141 mg/dL — AB (ref 65–99)
GLUCOSE-CAPILLARY: 151 mg/dL — AB (ref 65–99)
GLUCOSE-CAPILLARY: 162 mg/dL — AB (ref 65–99)
Glucose-Capillary: 150 mg/dL — ABNORMAL HIGH (ref 65–99)
Glucose-Capillary: 116 mg/dL — ABNORMAL HIGH (ref 65–99)
Glucose-Capillary: 173 mg/dL — ABNORMAL HIGH (ref 65–99)

## 2015-06-19 MED ORDER — NITROGLYCERIN 2 % TD OINT
1.0000 [in_us] | TOPICAL_OINTMENT | Freq: Three times a day (TID) | TRANSDERMAL | Status: DC
  Administered 2015-06-19 – 2015-06-27 (×24): 1 [in_us] via TOPICAL
  Filled 2015-06-19 (×24): qty 1

## 2015-06-19 MED ORDER — ALPRAZOLAM 0.25 MG PO TABS
0.2500 mg | ORAL_TABLET | Freq: Three times a day (TID) | ORAL | Status: DC | PRN
  Administered 2015-06-19 – 2015-06-20 (×2): 0.25 mg via ORAL
  Filled 2015-06-19 (×2): qty 1

## 2015-06-19 NOTE — Progress Notes (Signed)
[facility]  ROUNDING NOTE   Subjective:  Pt now extubated. States shes thirsty. Had lots of secretions per nursing.  Na high at 152.     Objective:  Vital signs in last 24 hours:  Temp:  [97.5 F (36.4 C)-99.4 F (37.4 C)] 97.5 F (36.4 C) (10/11 0400) Pulse Rate:  [47-109] 47 (10/11 0600) Resp:  [14-32] 14 (10/11 0600) BP: (132-189)/(61-114) 144/78 mmHg (10/11 0600) SpO2:  [90 %-100 %] 97 % (10/11 0600) FiO2 (%):  [24 %-55 %] 50 % (10/10 1400) Weight:  [59.1 kg (130 lb 4.7 oz)] 59.1 kg (130 lb 4.7 oz) (10/11 0400)  Weight change: 0.7 kg (1 lb 8.7 oz) Filed Weights   06/17/15 0500 06/18/15 0557 06/19/15 0400  Weight: 59.5 kg (131 lb 2.8 oz) 58.4 kg (128 lb 12 oz) 59.1 kg (130 lb 4.7 oz)    Intake/Output: I/O last 3 completed shifts: In: 3167.7 [I.V.:757.7; NG/GT:2210; IV Piggyback:200] Out: 3920 [Urine:3920]   Intake/Output this shift:  Total I/O In: 755.3 [I.V.:705.3; IV Piggyback:50] Out: 1050 [Urine:1050]  Physical Exam: General: No acute distress  Head: Normocephalic, atraumatic, OM dry  Eyes: Anicteric  Neck: Supple, trachea midline  Lungs:  Scattered rhonchi, normal effort  Heart: Regular rate and rhythm no rubs  Abdomen:  Soft, nontender, BS present  Extremities: Trace peripheral edema  Neurologic: Awake, alert, follows commands  Skin: No lesions  GU Foley with urine    Basic Metabolic Panel:  Recent Labs Lab 06/15/15 0400 06/16/15 0539 06/17/15 0041 06/18/15 0339 06/19/15 0119  NA 151* 150* 145 147*  149* 152*  K 3.4* 4.0 5.0 4.3  4.4 3.9  CL 105 105 104 106  105 111  CO2 37* 37* 33* 33*  33* 32  GLUCOSE 147* 200* 335* 275*  275* 137*  BUN 52* 58* 68* 80*  87* 82*  CREATININE 1.73* 1.75* 1.73* 1.77*  1.73* 1.62*  CALCIUM 9.0 8.8* 8.8* 8.8*  9.0 9.3  PHOS  --   --   --  3.4  --     Liver Function Tests:  Recent Labs Lab 06/18/15 0339  ALBUMIN 2.2*   No results for input(s): LIPASE, AMYLASE in the last 168  hours. No results for input(s): AMMONIA in the last 168 hours.  CBC:  Recent Labs Lab 06/15/15 0400 06/16/15 0539 06/17/15 0041 06/18/15 0339 06/19/15 0119  WBC 11.3* 14.7* 13.8* 14.4* 16.0*  HGB 9.2* 9.1* 9.7* 9.6* 11.0*  HCT 30.3* 29.5* 32.0* 30.8* 36.8  MCV 81.8 81.0 82.0 81.4 82.6  PLT 216 208 208 249 329    Cardiac Enzymes: No results for input(s): CKTOTAL, CKMB, CKMBINDEX, TROPONINI in the last 168 hours.  BNP: Invalid input(s): POCBNP  CBG:  Recent Labs Lab 06/18/15 1234 06/18/15 1558 06/18/15 2115 06/19/15 0052 06/19/15 0345  GLUCAP 150* 125* 2* 151* 150*    Microbiology: Results for orders placed or performed during the hospital encounter of 06/08/15  MRSA PCR Screening     Status: None   Collection Time: 06/09/15  5:26 AM  Result Value Ref Range Status   MRSA by PCR NEGATIVE NEGATIVE Final    Comment:        The GeneXpert MRSA Assay (FDA approved for NASAL specimens only), is one component of a comprehensive MRSA colonization surveillance program. It is not intended to diagnose MRSA infection nor to guide or monitor treatment for MRSA infections.   C difficile quick scan w PCR reflex     Status: None  Collection Time: 06/17/15  8:36 AM  Result Value Ref Range Status   C Diff antigen NEGATIVE NEGATIVE Final   C Diff toxin NEGATIVE NEGATIVE Final   C Diff interpretation Negative for C. difficile  Final    Coagulation Studies:  Recent Labs  06/16/15 1710  LABPROT 14.2  INR 1.08    Urinalysis: No results for input(s): COLORURINE, LABSPEC, PHURINE, GLUCOSEU, HGBUR, BILIRUBINUR, KETONESUR, PROTEINUR, UROBILINOGEN, NITRITE, LEUKOCYTESUR in the last 72 hours.  Invalid input(s): APPERANCEUR    Imaging: Dg Abd Portable 1v  06/18/2015   CLINICAL DATA:  Orogastric tube placement  EXAM: PORTABLE ABDOMEN - 1 VIEW  COMPARISON:  06/14/2015  FINDINGS: The orogastric tube curls in the distal esophagus. The tip is directed cephalad and is  above the included field of view.  IMPRESSION: 1. Orogastric tube does not into the stomach. It extends to the distal esophagus where it curls upon itself. The tip is above the included field of view.   Electronically Signed   By: Lajean Manes M.D.   On: 06/18/2015 18:32     Medications:   . sodium chloride Stopped (06/18/15 1832)  . dextrose 50 mL/hr at 06/18/15 1832  . heparin 1,100 Units/hr (06/17/15 1900)   . ampicillin-sulbactam (UNASYN) IV  1.5 g Intravenous Q12H  . antiseptic oral rinse  7 mL Mouth Rinse QID  . carvedilol  3.125 mg Per Tube BID WC  . chlorhexidine gluconate  15 mL Mouth Rinse BID  . fentaNYL (SUBLIMAZE) injection  50 mcg Intravenous Once  . furosemide  20 mg Oral Daily  . hydrALAZINE  10 mg Intravenous Q6H  . insulin aspart  0-9 Units Subcutaneous 6 times per day  . lamoTRIgine  100 mg Oral BID  . latanoprost  1 drop Both Eyes QHS  . metoprolol  2.5 mg Intravenous 3 times per day  . nitroGLYCERIN  1 inch Topical 3 times per day  . nystatin   Topical TID  . pantoprazole (PROTONIX) IV  40 mg Intravenous Q24H  . pyridostigmine  10 mg Intravenous TID  . spironolactone  25 mg Oral Daily   albuterol, fentaNYL (SUBLIMAZE) injection, ipratropium-albuterol, metoprolol, ondansetron **OR** ondansetron (ZOFRAN) IV, senna  Assessment/ Plan:  Mackenzie Rose is a 69 y.o. white female with coronary artery disease, atrial fibrillation, hypertension, hyperlipidemia, diabetes mellitus type II, COPD, tremor, systolic congestive heart failure, depression, anxiety, dementia, bipolar disease who was admitted on 06/08/2015   1. 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 Chronic Kidney Disease with proteinuria secondary to diabetic nephropathy and renal artery stenosis. Left kidney atrophy on CT 05/21/2015.  - Renal function improved, will continue  hydration for now, will stop lasix as well.  2. Hypernatremia: Na higher this AM at 152, will increase D5W to 100cc/hr, dubhoff couldn't be passed.   3. Renal mass: right: seen on CT. Ultrasound found these to be simple cysts.   4. Acute respiratory failure:  Extubated 06/18/15. Having lots of secretions per nursing, had lots of suctioning.  Continue to monitor status off vent closely.   LOS: 10 Ibraheem Voris 10/11/20166:40 AM

## 2015-06-19 NOTE — Evaluation (Signed)
ST Eval Note: pt given trials of ice chips, tsps of thin liquids(water and gingerale), 1/2 tsps of applesauce(puree). Pt exhibited wet respiration following effortful swallowing w/ thin liquids x3/6 trials; no overt s/s of aspiration w/ the ice chips and trials of puree. Pt presents w/ significant Cognitive deficits/confusion as well and required max. Cues for follow through and attention to task. Pt appears at increased risk for aspiration at this time. Rec. Pleasure trials of ice chips (single w/ strict aspiration precautions) and meds in small tsp of puree - crushed as able. Stop any po's if s/s of aspiration noted. Pt continued to c/o being unable to breath; NSG aware. Discussed w/ MD/NSG who agreed. ST will f/u w/ further po assessment tomorrow.

## 2015-06-19 NOTE — Progress Notes (Addendum)
ANTICOAGULATION CONSULT NOTE - Follow Up Consult  Pharmacy Consult for Heparin Indication: DVT  Allergies  Allergen Reactions  . Lactose Intolerance (Gi) Other (See Comments)    Reaction:  GI upset   . Lithium Other (See Comments)    Reaction:  Unknown   . Penicillins Rash    Patient tolerated Ampicillin-Sulbactam     Patient Measurements: Height: 5\' 4"  (162.6 cm) Weight: 130 lb 4.7 oz (59.1 kg) IBW/kg (Calculated) : 54.7 Heparin Dosing Weight:   Vital Signs: Temp: 98.8 F (37.1 C) (10/11 1900) Temp Source: Oral (10/11 1900) BP: 147/57 mmHg (10/11 2100) Pulse Rate: 78 (10/11 2100)  Labs:  Recent Labs  06/17/15 0041  06/18/15 0339 06/19/15 0119 06/19/15 0442 06/19/15 1316 06/19/15 2125  HGB 9.7*  --  9.6* 11.0*  --   --   --   HCT 32.0*  --  30.8* 36.8  --   --   --   PLT 208  --  249 329  --   --   --   HEPARINUNFRC 0.27*  < > 0.53  --  0.85* 0.61 0.65  CREATININE 1.73*  --  1.77*  1.73* 1.62*  --   --   --   < > = values in this interval not displayed.  Estimated Creatinine Clearance: 27.5 mL/min (by C-G formula based on Cr of 1.62).   Medications:  Prescriptions prior to admission  Medication Sig Dispense Refill Last Dose  . acetaminophen (TYLENOL) 650 MG CR tablet Take 650 mg by mouth daily. Pt takes daily at 2pm and every four hours as needed for pain.   Taking  . albuterol (PROVENTIL HFA;VENTOLIN HFA) 108 (90 BASE) MCG/ACT inhaler Inhale 2 puffs into the lungs every 4 (four) hours as needed for wheezing or shortness of breath. 6.7 g 1 Taking  . bismuth subsalicylate (PEPTO BISMOL) 262 MG/15ML suspension Take 5 mLs by mouth 4 (four) times daily as needed for diarrhea or loose stools (or nausea).   Taking  . busPIRone (BUSPAR) 10 MG tablet Take 10 mg by mouth 2 (two) times daily.    Taking  . Dextromethorphan-Benzocaine (SORE THROAT & COUGH LOZENGES MT) Use as directed 1 lozenge in the mouth or throat 4 (four) times daily as needed (for cough).   Taking  .  diclofenac sodium (VOLTAREN) 1 % GEL Apply 2 g topically 2 (two) times daily.    Taking  . diltiazem (CARDIZEM CD) 180 MG 24 hr capsule Take 180 mg by mouth daily.    Taking  . donepezil (ARICEPT) 10 MG tablet Take 1 tablet (10 mg total) by mouth daily. 30 tablet 3 Taking  . doxycycline (VIBRAMYCIN) 50 MG capsule Take 1 capsule by mouth 2 (two) times daily.     . ferrous sulfate 325 (65 FE) MG tablet Take 1 tablet (325 mg total) by mouth 2 (two) times daily with a meal. 60 tablet 3 Taking  . Fluticasone-Salmeterol (ADVAIR) 250-50 MCG/DOSE AEPB Inhale 1 puff into the lungs 2 (two) times daily.   Taking  . glimepiride (AMARYL) 1 MG tablet Take 1 tablet by mouth 2 (two) times daily.     Marland Kitchen guaifenesin (ROBITUSSIN) 100 MG/5ML syrup Take 100 mg by mouth 2 (two) times daily as needed for cough.   Taking  . hydrALAZINE (APRESOLINE) 50 MG tablet Take 1 tablet (50 mg total) by mouth 3 (three) times daily. 90 tablet 0 Taking  . lamoTRIgine (LAMICTAL) 100 MG tablet Take 100 mg by mouth 2 (two)  times daily.   Taking  . latanoprost (XALATAN) 0.005 % ophthalmic solution Place 1 drop into both eyes at bedtime.    Taking  . nicotine (NICODERM CQ - DOSED IN MG/24 HR) 7 mg/24hr patch Place 1 patch (7 mg total) onto the skin daily. 15 patch 0 Taking  . ondansetron (ZOFRAN) 4 MG tablet Take 1 tablet (4 mg total) by mouth every 8 (eight) hours as needed for nausea or vomiting. 20 tablet 0 Taking  . pantoprazole (PROTONIX) 40 MG tablet Take 1 tablet (40 mg total) by mouth 2 (two) times daily before a meal. 60 tablet 2 Taking  . phenylephrine-shark liver oil-mineral oil-petrolatum (PREPARATION H) 0.25-3-14-71.9 % rectal ointment Place 1 application rectally 2 (two) times daily as needed for hemorrhoids.   Taking  . polyethylene glycol (MIRALAX / GLYCOLAX) packet Take 17 g by mouth daily as needed for mild constipation.   Taking  . potassium chloride SA (K-DUR,KLOR-CON) 20 MEQ tablet Take 20 mEq by mouth daily.   Taking  .  predniSONE (DELTASONE) 10 MG tablet Take 30 mg by mouth every other day.   Taking  . pyridostigmine (MESTINON) 60 MG tablet Take 60 mg by mouth 3 (three) times daily.    Taking  . QUEtiapine (SEROQUEL) 200 MG tablet Take 1 tablet (200 mg total) by mouth at bedtime. 30 tablet 3 Taking  . sodium chloride (OCEAN) 0.65 % SOLN nasal spray Place 2 sprays into both nostrils daily as needed for congestion.    Taking  . traMADol (ULTRAM) 50 MG tablet Take 50 mg by mouth 2 (two) times daily as needed.   Taking  . traZODone (DESYREL) 50 MG tablet Take 1 tablet (50 mg total) by mouth at bedtime. 30 tablet 0 Taking    Assessment: 10/11 :    HL @ 13:00 = 0.61                HL @ 21:25 = 0.65  Goal of Therapy:  Heparin level 0.3-0.7 units/ml Monitor platelets by anticoagulation protocol: Yes   Plan:  Will continue this pt on current rate of 1050 units/hr. Will recheck HL on 10/12 with AM labs.   Krystalynn Ridgeway D 06/19/2015,10:03 PM

## 2015-06-19 NOTE — Progress Notes (Signed)
Tolerating extubation well. RASS 0. + F/C. Diffusely weak   Filed Vitals:   06/19/15 0700 06/19/15 0800 06/19/15 1100 06/19/15 1300  BP: 171/64 171/105 142/83 148/76  Pulse: 71 97 93 99  Temp: 97.9 F (36.6 C)   97.9 F (36.6 C)  TempSrc: Oral   Oral  Resp: 21 25 23 19   Height:      Weight:      SpO2: 96% 100% 95% 99%   NAD HEENT WNL No JVD Few scattered rhonchi Reg, no M NABS, soft No edema Diffusely weak, no focal deficits  BMP Latest Ref Rng 06/19/2015 06/18/2015 06/18/2015  Glucose 65 - 99 mg/dL 137(H) 275(H) 275(H)  BUN 6 - 20 mg/dL 82(H) 87(H) 80(H)  Creatinine 0.44 - 1.00 mg/dL 1.62(H) 1.73(H) 1.77(H)  Sodium 135 - 145 mmol/L 152(H) 149(H) 147(H)  Potassium 3.5 - 5.1 mmol/L 3.9 4.4 4.3  Chloride 101 - 111 mmol/L 111 105 106  CO2 22 - 32 mmol/L 32 33(H) 33(H)  Calcium 8.9 - 10.3 mg/dL 9.3 9.0 8.8(L)    CBC Latest Ref Rng 06/19/2015 06/18/2015 06/17/2015  WBC 3.6 - 11.0 K/uL 16.0(H) 14.4(H) 13.8(H)  Hemoglobin 12.0 - 16.0 g/dL 11.0(L) 9.6(L) 9.7(L)  Hematocrit 35.0 - 47.0 % 36.8 30.8(L) 32.0(L)  Platelets 150 - 440 K/uL 329 249 208    CXR: NNF  IMPRESSION: VDRF, extubated 10/10 Marginal cough mechanics due to myasthenia Concern for aspiration PNA - S/P 7 days abx Atelctasis Systolic CHF (LVEF 46-28%) CKD, stage III Severe hypernatremia Myasthenia gravis Concern for angioedema 10/09 - clinically resolved Chronic PPI use   PLAN/REC: SLP re-eval - advance diet per recs Monitor in SDU post extubation Monitor BMET intermittently Monitor I/Os Correct electrolytes as indicated D5W increased 10/11 Follow CXR intermittently DC amp-sulbactam and follow off abx Cont IV PPI - changed to PO if passes SLP eval  Merton Border, MD PCCM service Mobile [phone] Pager [phone]

## 2015-06-19 NOTE — Consult Note (Signed)
ANTICOAGULATION CONSULT NOTE - Initial Consult  Pharmacy Consult for heparin Indication: DVT  Allergies  Allergen Reactions  . Lactose Intolerance (Gi) Other (See Comments)    Reaction:  GI upset   . Lithium Other (See Comments)    Reaction:  Unknown   . Penicillins Rash    Patient tolerated Ampicillin-Sulbactam     Patient Measurements: Height: 5\' 4"  (162.6 cm) Weight: 130 lb 4.7 oz (59.1 kg) IBW/kg (Calculated) : 54.7 Heparin Dosing Weight: 60.1kg  Vital Signs: Temp: 97.5 F (36.4 C) (10/11 0400) Temp Source: Axillary (10/11 0400) BP: 144/78 mmHg (10/11 0600) Pulse Rate: 47 (10/11 0600)  Labs:  Recent Labs  06/16/15 1710  06/17/15 0041  06/17/15 2019 06/18/15 0339 06/19/15 0119 06/19/15 0442  HGB  --   < > 9.7*  --   --  9.6* 11.0*  --   HCT  --   --  32.0*  --   --  30.8* 36.8  --   PLT  --   --  208  --   --  249 329  --   APTT 32  --   --   --   --   --   --   --   LABPROT 14.2  --   --   --   --   --   --   --   INR 1.08  --   --   --   --   --   --   --   HEPARINUNFRC  --   --  0.27*  < > 0.46 0.53  --  0.85*  CREATININE  --   --  1.73*  --   --  1.77*  1.73* 1.62*  --   < > = values in this interval not displayed.  Estimated Creatinine Clearance: 27.5 mL/min (by C-G formula based on Cr of 1.62).   Medical History: Past Medical History  Diagnosis Date  . Heart disease   . Atrial fibrillation ([place])     a. reported a-fib; b. unknown chronicity; c. dates back to 1970s; d. not on long term anticoagulation 2/2 no documented a-fib  . Hypertension   . High cholesterol   . Diabetes ([place])     borderline  . COPD (chronic obstructive pulmonary disease) ([place])   . Melanoma ([place])   . Kidney disorder   . Tremor   . Autoimmune disease ([place])   . Depressed   . Anxiety disorder   . Migraine   . Dementia   . Motion sickness   . Myasthenia gravis ([place])   . Bipolar disorder ([place])   . Dementia   . Acute renal failure ([place])   . Near syncope      Medications:  Scheduled:  . ampicillin-sulbactam (UNASYN) IV  1.5 g Intravenous Q12H  . antiseptic oral rinse  7 mL Mouth Rinse QID  . carvedilol  3.125 mg Per Tube BID WC  . chlorhexidine gluconate  15 mL Mouth Rinse BID  . fentaNYL (SUBLIMAZE) injection  50 mcg Intravenous Once  . hydrALAZINE  10 mg Intravenous Q6H  . insulin aspart  0-9 Units Subcutaneous 6 times per day  . lamoTRIgine  100 mg Oral BID  . latanoprost  1 drop Both Eyes QHS  . metoprolol  2.5 mg Intravenous 3 times per day  . nitroGLYCERIN  1 inch Topical 3 times per day  . nystatin   Topical TID  . pantoprazole (PROTONIX) IV  40 mg Intravenous Q24H  .  pyridostigmine  10 mg Intravenous TID  . spironolactone  25 mg Oral Daily    Assessment: Pt is a 69 year old women in the ICU. Doppler today showed right axillary and brachial DVT, associated with PICC line. Heparin drip ordered by Dr. Mortimer Fries. Per Dr. Mortimer Fries, no bolus. Pharmacy consulted to dose Goal of Therapy:  Heparin level 0.3-0.7 units/ml Monitor platelets by anticoagulation protocol: Yes   Plan:  Heparin level = 0.46 Heparin level is at goal so will continue heparin infusion at 1100 units/hr and check daily levels  10/11 AM heparin level 0.53. Recheck with CBC tomorrow AM.  1011 0617 heparil level supratherapeutic - decreased to 1050 units/hr since initial rate of 1000 units/hr was insufficient. Will recheck level in 6 hours.  Laural Benes, Pharm.D. Clinical Pharmacist 06/19/2015,6:54 AM

## 2015-06-19 NOTE — Progress Notes (Signed)
Spoke with Dr. Marcille Blanco about pt's blood pressures remaining elevated.  Per MD give 1inch nitro paste.

## 2015-06-19 NOTE — Progress Notes (Signed)
PT Cancellation Note  Patient Details Name: Mackenzie Rose MRN: [id number] DOB: 30-May-1946   Cancelled Treatment:    Reason Eval/Treat Not Completed: Patient declined, no reason specified (reported refusal to nursing ).  Pt refused therapy today due to "not feeling well" and will try again tomorrow.     Ramond Dial 06/19/2015, 4:05 PM   Mee Hives, PT MS Acute Rehab Dept. Number: [facility] [id number] and [place] [phone]

## 2015-06-19 NOTE — Consult Note (Signed)
ANTICOAGULATION CONSULT NOTE -Follow UP  Pharmacy Consult for heparin Indication: DVT  Allergies  Allergen Reactions  . Lactose Intolerance (Gi) Other (See Comments)    Reaction:  GI upset   . Lithium Other (See Comments)    Reaction:  Unknown   . Penicillins Rash    Patient tolerated Ampicillin-Sulbactam     Patient Measurements: Height: 5\' 4"  (162.6 cm) Weight: 130 lb 4.7 oz (59.1 kg) IBW/kg (Calculated) : 54.7 Heparin Dosing Weight: 60.1kg  Vital Signs: Temp: 97.9 F (36.6 C) (10/11 1300) Temp Source: Oral (10/11 1300) BP: 148/76 mmHg (10/11 1300) Pulse Rate: 99 (10/11 1300)  Labs:  Recent Labs  06/16/15 1710  06/17/15 0041  06/18/15 0339 06/19/15 0119 06/19/15 0442 06/19/15 1316  HGB  --   < > 9.7*  --  9.6* 11.0*  --   --   HCT  --   --  32.0*  --  30.8* 36.8  --   --   PLT  --   --  208  --  249 329  --   --   APTT 32  --   --   --   --   --   --   --   LABPROT 14.2  --   --   --   --   --   --   --   INR 1.08  --   --   --   --   --   --   --   HEPARINUNFRC  --   --  0.27*  < > 0.53  --  0.85* 0.61  CREATININE  --   --  1.73*  --  1.77*  1.73* 1.62*  --   --   < > = values in this interval not displayed.  Estimated Creatinine Clearance: 27.5 mL/min (by C-G formula based on Cr of 1.62).   Medical History: Past Medical History  Diagnosis Date  . Heart disease   . Atrial fibrillation ([place])     a. reported a-fib; b. unknown chronicity; c. dates back to 1970s; d. not on long term anticoagulation 2/2 no documented a-fib  . Hypertension   . High cholesterol   . Diabetes ([place])     borderline  . COPD (chronic obstructive pulmonary disease) ([place])   . Melanoma ([place])   . Kidney disorder   . Tremor   . Autoimmune disease ([place])   . Depressed   . Anxiety disorder   . Migraine   . Dementia   . Motion sickness   . Myasthenia gravis ([place])   . Bipolar disorder ([place])   . Dementia   . Acute renal failure ([place])   . Near syncope     Medications:   Scheduled:  . antiseptic oral rinse  7 mL Mouth Rinse QID  . carvedilol  3.125 mg Per Tube BID WC  . chlorhexidine gluconate  15 mL Mouth Rinse BID  . fentaNYL (SUBLIMAZE) injection  50 mcg Intravenous Once  . hydrALAZINE  10 mg Intravenous Q6H  . insulin aspart  0-9 Units Subcutaneous 6 times per day  . lamoTRIgine  100 mg Oral BID  . latanoprost  1 drop Both Eyes QHS  . metoprolol  2.5 mg Intravenous 3 times per day  . nitroGLYCERIN  1 inch Topical 3 times per day  . nystatin   Topical TID  . pantoprazole (PROTONIX) IV  40 mg Intravenous Q24H  . pyridostigmine  10 mg Intravenous TID  . spironolactone  25 mg Oral Daily    Assessment: Pt is a 70 year old women in the ICU. Doppler today showed right axillary and brachial DVT, associated with PICC line. Heparin drip ordered by Dr. Mortimer Fries. Per Dr. Mortimer Fries, no bolus. Pharmacy consulted to dose Goal of Therapy:  Heparin level 0.3-0.7 units/ml Monitor platelets by anticoagulation protocol: Yes   Plan:  Heparin level = 0.46 Heparin level is at goal so will continue heparin infusion at 1100 units/hr and check daily levels  10/11 AM heparin level 0.53. Recheck with CBC tomorrow AM.  1011 0617 heparil level supratherapeutic = 0.85- decreased to 1050 units/hr since initial rate of 1000 units/hr was insufficient. Will recheck level in 6 hours.  1011  1316  Heparin level= 0.61.  Recheck heparin level at 2100 for verification of therapeutic level. CBC in am.  Orlen Leedy A, Pharm.D. Clinical Pharmacist 06/19/2015,4:12 PM

## 2015-06-19 NOTE — Progress Notes (Signed)
Brief Nutrition Note:   Noted dobhoff insertion attempted yesterday x 3; placement unsuccessful. Pt did take meds in applesauce this AM; SLP eval is pending for diet advancement. Will continue to follow  [place], RD, LDN [phone] Pager

## 2015-06-19 NOTE — Progress Notes (Signed)
[facility] [place] NAME: Mackenzie Rose    MR#:  [id number]  DATE OF BIRTH:  02-Jun-1946  SUBJECTIVE:   Pt. Extubated yesterday and clinically doing well.  No complaints.  Quite weak & Lethargic. Follows simple commands.    Review of Systems  Constitutional: Negative for fever and chills.  HENT: Negative for congestion and tinnitus.   Eyes: Negative for blurred vision and double vision.  Respiratory: Negative for cough, shortness of breath and wheezing.   Cardiovascular: Negative for chest pain, orthopnea and PND.  Gastrointestinal: Negative for nausea, vomiting, abdominal pain and diarrhea.  Genitourinary: Negative for dysuria and hematuria.  Neurological: Positive for weakness (generalized). Negative for dizziness, sensory change and focal weakness.  All other systems reviewed and are negative.  Tolerating Diet: seen by speech and started on sips and ice chips.  Tolerating PT: Await evaluation  DRUG ALLERGIES:   Allergies  Allergen Reactions  . Lactose Intolerance (Gi) Other (See Comments)    Reaction:  GI upset   . Lithium Other (See Comments)    Reaction:  Unknown   . Penicillins Rash    Patient tolerated Ampicillin-Sulbactam     VITALS:  Blood pressure 148/76, pulse 99, temperature 97.9 F (36.6 C), temperature source Oral, resp. rate 19, height 5\' 4"  (1.626 m), weight 59.1 kg (130 lb 4.7 oz), SpO2 99 %.  PHYSICAL EXAMINATION:   Physical Exam  GENERAL:  69 y.o.-year-old patient lying in the bed globally weak and lethargic  EYES: pupils are equally reactive to light and accommodation. No scleral icterus. HEENT: Head atraumatic, normocephalic. Oropharynx and nasopharynx clear.   NECK:  Supple, no jugular venous distention. No thyroid enlargement, no tenderness.  LUNGS: clear to auscultation bilaterally, no rales, no rhonchi no wheezes  No use of accessory muscles of respiration.  CARDIOVASCULAR: S1, S2 normal. No  murmurs, rubs, or gallops.  ABDOMEN: Soft, nontender, nondistended. Bowel sounds present. No organomegaly or mass.  EXTREMITIES: No cyanosis, clubbing, +1 edema bilaterally.  Right upper extremity edema and warmth. NEUROLOGIC: Globally weak but no other focal motor or sensory gestation bilaterally  PSYCHIATRIC: alert, oriented x 3.   SKIN: right groin/thigh fungal rash lesion-improved remarkably   LABORATORY PANEL:   CBC  Recent Labs Lab 06/19/15 0119  WBC 16.0*  HGB 11.0*  HCT 36.8  PLT 329    Chemistries   Recent Labs Lab 06/19/15 0119  NA 152*  K 3.9  CL 111  CO2 32  GLUCOSE 137*  BUN 82*  CREATININE 1.62*  CALCIUM 9.3    Cardiac Enzymes No results for input(s): TROPONINI in the last 168 hours.  RADIOLOGY:  Dg Chest Port 1 View  06/19/2015   CLINICAL DATA:  Respiratory failure  EXAM: PORTABLE CHEST 1 VIEW  COMPARISON:  June 16, 2015  FINDINGS: Endotracheal tube and nasogastric tube have been removed. Central catheter tip is at the cavoatrial junction. No pneumothorax. There is bibasilar airspace consolidation with small bilateral effusions. There is a calcified granuloma in the left upper lobe. There is no new opacity. Heart is prominent with pulmonary vascularity within normal limits. No change in cardiac silhouette. No adenopathy.  IMPRESSION: Stable airspace consolidation with small effusions in the bases. Stable left upper lobe granuloma. No new opacity. No change in cardiac silhouette. No pneumothorax.   Electronically Signed   By: Mackenzie Rose M.D.   On: 06/19/2015 07:38   Dg Abd Portable 1v  06/18/2015   CLINICAL  DATA:  Orogastric tube placement  EXAM: PORTABLE ABDOMEN - 1 VIEW  COMPARISON:  06/14/2015  FINDINGS: The orogastric tube curls in the distal esophagus. The tip is directed cephalad and is above the included field of view.  IMPRESSION: 1. Orogastric tube does not into the stomach. It extends to the distal esophagus where it curls upon  itself. The tip is above the included field of view.   Electronically Signed   By: Lajean Manes M.D.   On: 06/18/2015 18:32     ASSESSMENT AND PLAN:  69 y.o. female who presents with shortness of breath, worsening over the last 3-4 days. Patient states that she's also had some intermittent chest pains throughout the same time. She states that she's had some episodes of acute CHF in the past, but denies being chronically followed for CHF  1. acute hypoxic respiratory failure secondary to Acute systolic CHF (congestive heart failure) and Aspiration PNA -Patient was transferred to ICU given impending respiratory failure got intubated and on the ventilator October 3-->extubated 10/4-->re intubated October 6.--->extubated oct 10th -was on IV Lasix 40 mg twice a day. About 5.5 L (-) since admission.  -echo shows EF 35-40% (was 55-60% on July 2016) -Off IV antibiotics now, off IV Lasix. Clinically improved.  2. Acute on Chronic RF (acute renal failure) CKD-Rose -Creatinine still 1.6 and not at baseline. Continue to monitor -Renal ultrasound bilateral kidney cysts.  3. Myasthenia gravis - no acute issue. - cont. Pyridostigmine  4.HTN (hypertension)-stable  -continue coreg, hydralazine, spironolactone  5.Type 2 diabetes mellitus-blood sugar stable and continue sliding scale insulin   6.COPD (chronic obstructive pulmonary disease) - continue duo nebs.    7.h/o chronic afib -Rate controlled and continue on coreg.   8.Bipolar affective disorder, mixed - continue Lamictal  9. Right groin skin rash appears candidial-much improved -Continue Nystatin powder   10. Right upper extremity DVT-continue heparin drip.  Await physical therapy evaluation. Patient can likely be transferred to the floor tomorrow.   appreciate speech eval and he will reevaluate patient tomorrow and possibly advance diet   CODE STATUS:FULL  DVT Prophylaxis: lovenox  TOTAL TIME Spent in CARE OF THIS PATIENT: 30  minutes.    [name] M.D on 06/19/2015 at 9:03 AM  Between 7am to 6pm - Pager - [phone]  After 6pm go to [email or web address] - password [email or web address]  Office  [phone]  CC: Primary care physician; Donato Schultz, MD

## 2015-06-19 NOTE — Progress Notes (Signed)
ANTIBIOTIC CONSULT NOTE - FOLLOW UP  Pharmacy Consult for Unasyn Indication: Aspiration PNA  Allergies  Allergen Reactions  . Lactose Intolerance (Gi) Other (See Comments)    Reaction:  GI upset   . Lithium Other (See Comments)    Reaction:  Unknown   . Penicillins Rash    Patient tolerated Ampicillin-Sulbactam     Patient Measurements: Height: 5\' 4"  (162.6 cm) Weight: 130 lb 4.7 oz (59.1 kg) IBW/kg (Calculated) : 54.7   Vital Signs: Temp: 97.9 F (36.6 C) (10/11 0700) Temp Source: Axillary (10/11 0400) BP: 171/105 mmHg (10/11 0800) Pulse Rate: 97 (10/11 0800) Intake/Output from previous day: 10/10 0701 - 10/11 0700 In: 1290.2 [I.V.:975.2; NG/GT:215; IV Piggyback:100] Out: 2065 [Urine:2065] Intake/Output from this shift:    Labs:  Recent Labs  06/17/15 0041 06/18/15 0339 06/19/15 0119  WBC 13.8* 14.4* 16.0*  HGB 9.7* 9.6* 11.0*  PLT 208 249 329  CREATININE 1.73* 1.77*  1.73* 1.62*   Estimated Creatinine Clearance: 27.5 mL/min (by C-G formula based on Cr of 1.62).  Recent Labs  06/17/15 2319  VANCOTROUGH 27*     Microbiology: Recent Results (from the past 720 hour(s))  Urine culture     Status: None   Collection Time: 05/20/15  9:23 PM  Result Value Ref Range Status   Specimen Description URINE, RANDOM  Final   Special Requests NONE  Final   Culture MULTIPLE SPECIES PRESENT, SUGGEST RECOLLECTION  Final   Report Status 05/22/2015 FINAL  Final  MRSA PCR Screening     Status: None   Collection Time: 05/21/15  3:53 AM  Result Value Ref Range Status   MRSA by PCR NEGATIVE NEGATIVE Final    Comment:        The GeneXpert MRSA Assay (FDA approved for NASAL specimens only), is one component of a comprehensive MRSA colonization surveillance program. It is not intended to diagnose MRSA infection nor to guide or monitor treatment for MRSA infections.   MRSA PCR Screening     Status: None   Collection Time: 06/09/15  5:26 AM  Result Value Ref Range  Status   MRSA by PCR NEGATIVE NEGATIVE Final    Comment:        The GeneXpert MRSA Assay (FDA approved for NASAL specimens only), is one component of a comprehensive MRSA colonization surveillance program. It is not intended to diagnose MRSA infection nor to guide or monitor treatment for MRSA infections.   C difficile quick scan w PCR reflex     Status: None   Collection Time: 06/17/15  8:36 AM  Result Value Ref Range Status   C Diff antigen NEGATIVE NEGATIVE Final   C Diff toxin NEGATIVE NEGATIVE Final   C Diff interpretation Negative for C. difficile  Final    Anti-infectives    Start     Dose/Rate Route Frequency Ordered Stop   06/18/15 1100  vancomycin (VANCOCIN) IVPB 750 mg/150 ml premix  Status:  Discontinued     750 mg 150 mL/hr over 60 Minutes Intravenous Every 36 hours 06/18/15 1047 06/18/15 1128   06/18/15 0000  vancomycin (VANCOCIN) IVPB 750 mg/150 ml premix  Status:  Discontinued     750 mg 150 mL/hr over 60 Minutes Intravenous Every 36 hours 06/17/15 2349 06/18/15 1047   06/14/15 2330  vancomycin (VANCOCIN) IVPB 750 mg/150 ml premix  Status:  Discontinued     750 mg 150 mL/hr over 60 Minutes Intravenous Every 24 hours 06/14/15 1430 06/17/15 2349   06/14/15 1500  ampicillin-sulbactam (UNASYN) 1.5 g in sodium chloride 0.9 % 50 mL IVPB     1.5 g 100 mL/hr over 30 Minutes Intravenous Every 12 hours 06/14/15 1419     06/14/15 1415  vancomycin (VANCOCIN) IVPB 1000 mg/200 mL premix     1,000 mg 200 mL/hr over 60 Minutes Intravenous  Once 06/14/15 1408 06/14/15 1630   06/13/15 1200  levofloxacin (LEVAQUIN) IVPB 750 mg  Status:  Discontinued     750 mg 100 mL/hr over 90 Minutes Intravenous Every 48 hours 06/13/15 1146 06/14/15 1408   06/11/15 2200  clindamycin (CLEOCIN) IVPB 300 mg  Status:  Discontinued     300 mg 100 mL/hr over 30 Minutes Intravenous 3 times per day 06/11/15 2010 06/13/15 1145   06/11/15 1100  fluconazole (DIFLUCAN) tablet 150 mg  Status:   Discontinued     150 mg Oral Daily 06/11/15 1040 06/12/15 1153      Assessment: 69 yo female  being treated for aspiration pneumonia and acute hypoxic respiratory failure. Patients has worsening SOB over 3-4 days. Pharmacy consulted for dosing and monitoring of Unasyn since 10/6 (day 6 of therapy). Patient also received 5 days treatment with vancomycin, which was Dcd 10/10.  Patient remains afebrile, WBC increased from 14.4 to 16 today. CrCl 27.5 and with slight decrease in Scr from 1.73 to 1.62 today.     Plan:  Will continue patient on current dose of Unasyn 1.5g every 12 hours. Pharmacy will continue to monitor renal function and labs and make adjustments as needed.  Nancy Fetter, PharmD Pharmacy Resident

## 2015-06-20 ENCOUNTER — Ambulatory Visit: Payer: Medicare Other | Admitting: Neurology

## 2015-06-20 DIAGNOSIS — I509 Heart failure, unspecified: Secondary | ICD-10-CM

## 2015-06-20 DIAGNOSIS — J449 Chronic obstructive pulmonary disease, unspecified: Secondary | ICD-10-CM

## 2015-06-20 DIAGNOSIS — E119 Type 2 diabetes mellitus without complications: Secondary | ICD-10-CM

## 2015-06-20 DIAGNOSIS — F0390 Unspecified dementia without behavioral disturbance: Secondary | ICD-10-CM

## 2015-06-20 DIAGNOSIS — R1310 Dysphagia, unspecified: Secondary | ICD-10-CM

## 2015-06-20 DIAGNOSIS — J9620 Acute and chronic respiratory failure, unspecified whether with hypoxia or hypercapnia: Secondary | ICD-10-CM

## 2015-06-20 DIAGNOSIS — F319 Bipolar disorder, unspecified: Secondary | ICD-10-CM

## 2015-06-20 DIAGNOSIS — Z515 Encounter for palliative care: Secondary | ICD-10-CM

## 2015-06-20 DIAGNOSIS — F17210 Nicotine dependence, cigarettes, uncomplicated: Secondary | ICD-10-CM

## 2015-06-20 DIAGNOSIS — I10 Essential (primary) hypertension: Secondary | ICD-10-CM

## 2015-06-20 LAB — BASIC METABOLIC PANEL
ANION GAP: 7 (ref 5–15)
BUN: 60 mg/dL — ABNORMAL HIGH (ref 6–20)
CALCIUM: 8.8 mg/dL — AB (ref 8.9–10.3)
CHLORIDE: 112 mmol/L — AB (ref 101–111)
CO2: 30 mmol/L (ref 22–32)
Creatinine, Ser: 1.51 mg/dL — ABNORMAL HIGH (ref 0.44–1.00)
GFR calc non Af Amer: 34 mL/min — ABNORMAL LOW (ref >60)
GFR, EST AFRICAN AMERICAN: 39 mL/min — AB (ref >60)
Glucose, Bld: 113 mg/dL — ABNORMAL HIGH (ref 65–99)
Potassium: 3.4 mmol/L — ABNORMAL LOW (ref 3.5–5.1)
Sodium: 149 mmol/L — ABNORMAL HIGH (ref 135–145)

## 2015-06-20 LAB — HEPARIN LEVEL (UNFRACTIONATED): Heparin Unfractionated: 0.58 IU/mL (ref 0.30–0.70)

## 2015-06-20 LAB — CBC
HEMATOCRIT: 32.5 % — AB (ref 35.0–47.0)
HEMOGLOBIN: 9.9 g/dL — AB (ref 12.0–16.0)
MCH: 25.1 pg — ABNORMAL LOW (ref 26.0–34.0)
MCHC: 30.4 g/dL — ABNORMAL LOW (ref 32.0–36.0)
MCV: 82.6 fL (ref 80.0–100.0)
Platelets: 261 10*3/uL (ref 150–440)
RBC: 3.93 MIL/uL (ref 3.80–5.20)
RDW: 27.2 % — ABNORMAL HIGH (ref 11.5–14.5)
WBC: 10.8 10*3/uL (ref 3.6–11.0)

## 2015-06-20 LAB — PROCALCITONIN: Procalcitonin: 0.27 ng/mL

## 2015-06-20 LAB — GLUCOSE, CAPILLARY
GLUCOSE-CAPILLARY: 122 mg/dL — AB (ref 65–99)
GLUCOSE-CAPILLARY: 105 mg/dL — AB (ref 65–99)
GLUCOSE-CAPILLARY: 105 mg/dL — AB (ref 65–99)
Glucose-Capillary: 111 mg/dL — ABNORMAL HIGH (ref 65–99)
Glucose-Capillary: 122 mg/dL — ABNORMAL HIGH (ref 65–99)

## 2015-06-20 MED ORDER — METOPROLOL TARTRATE 25 MG PO TABS
25.0000 mg | ORAL_TABLET | Freq: Two times a day (BID) | ORAL | Status: DC
  Administered 2015-06-20 – 2015-06-21 (×3): 25 mg via ORAL
  Filled 2015-06-20 (×3): qty 1

## 2015-06-20 NOTE — Progress Notes (Signed)
[facility]  ROUNDING NOTE   Subjective:  Pt feeling better today. Cr currently 1.5. Na down to 149. Continues to state shes thirsty.  Objective:  Vital signs in last 24 hours:  Temp:  [97.9 F (36.6 C)-98.8 F (37.1 C)] 98.4 F (36.9 C) (10/12 0500) Pulse Rate:  [78-99] 78 (10/12 0645) Resp:  [15-26] 24 (10/12 0645) BP: (125-156)/(57-112) 130/82 mmHg (10/12 0645) SpO2:  [95 %-100 %] 100 % (10/12 0645) Weight:  [60.4 kg (133 lb 2.5 oz)] 60.4 kg (133 lb 2.5 oz) (10/12 0559)  Weight change: 1.3 kg (2 lb 13.9 oz) Filed Weights   06/18/15 0557 06/19/15 0400 06/20/15 0559  Weight: 58.4 kg (128 lb 12 oz) 59.1 kg (130 lb 4.7 oz) 60.4 kg (133 lb 2.5 oz)    Intake/Output: I/O last 3 completed shifts: In: 2651.5 [I.V.:2551.5; IV Piggyback:100] Out: 2500 [Urine:2500]   Intake/Output this shift:     Physical Exam: General: No acute distress  Head: Normocephalic, atraumatic, OM dry  Eyes: Anicteric  Neck: Supple, trachea midline  Lungs:  Scattered rhonchi, normal effort  Heart: Regular rate and rhythm no rubs  Abdomen:  Soft, nontender, BS present  Extremities: Trace peripheral edema  Neurologic: Awake, alert, follows commands  Skin: No lesions  GU Foley with urine    Basic Metabolic Panel:  Recent Labs Lab 06/16/15 0539 06/17/15 0041 06/18/15 0339 06/19/15 0119 06/20/15 0443  NA 150* 145 147*  149* 152* 149*  K 4.0 5.0 4.3  4.4 3.9 3.4*  CL 105 104 106  105 111 112*  CO2 37* 33* 33*  33* 32 30  GLUCOSE 200* 335* 275*  275* 137* 113*  BUN 58* 68* 80*  87* 82* 60*  CREATININE 1.75* 1.73* 1.77*  1.73* 1.62* 1.51*  CALCIUM 8.8* 8.8* 8.8*  9.0 9.3 8.8*  PHOS  --   --  3.4  --   --     Liver Function Tests:  Recent Labs Lab 06/18/15 0339  ALBUMIN 2.2*   No results for input(s): LIPASE, AMYLASE in the last 168 hours. No results for input(s): AMMONIA in the last 168 hours.  CBC:  Recent Labs Lab 06/16/15 0539 06/17/15 0041  06/18/15 0339 06/19/15 0119 06/20/15 0443  WBC 14.7* 13.8* 14.4* 16.0* 10.8  HGB 9.1* 9.7* 9.6* 11.0* 9.9*  HCT 29.5* 32.0* 30.8* 36.8 32.5*  MCV 81.0 82.0 81.4 82.6 82.6  PLT 208 208 249 329 261    Cardiac Enzymes: No results for input(s): CKTOTAL, CKMB, CKMBINDEX, TROPONINI in the last 168 hours.  BNP: Invalid input(s): POCBNP  CBG:  Recent Labs Lab 06/19/15 1609 06/19/15 2051 06/19/15 2354 06/20/15 0352 06/20/15 0729  GLUCAP 173* 106* 141* 122* 105*    Microbiology: Results for orders placed or performed during the hospital encounter of 06/08/15  MRSA PCR Screening     Status: None   Collection Time: 06/09/15  5:26 AM  Result Value Ref Range Status   MRSA by PCR NEGATIVE NEGATIVE Final    Comment:        The GeneXpert MRSA Assay (FDA approved for NASAL specimens only), is one component of a comprehensive MRSA colonization surveillance program. It is not intended to diagnose MRSA infection nor to guide or monitor treatment for MRSA infections.   C difficile quick scan w PCR reflex     Status: None   Collection Time: 06/17/15  8:36 AM  Result Value Ref Range Status   C Diff antigen NEGATIVE NEGATIVE Final  C Diff toxin NEGATIVE NEGATIVE Final   C Diff interpretation Negative for C. difficile  Final    Coagulation Studies: No results for input(s): LABPROT, INR in the last 72 hours.  Urinalysis: No results for input(s): COLORURINE, LABSPEC, PHURINE, GLUCOSEU, HGBUR, BILIRUBINUR, KETONESUR, PROTEINUR, UROBILINOGEN, NITRITE, LEUKOCYTESUR in the last 72 hours.  Invalid input(s): APPERANCEUR    Imaging: Dg Chest Port 1 View  06/19/2015  CLINICAL DATA:  Respiratory failure EXAM: PORTABLE CHEST 1 VIEW COMPARISON:  June 16, 2015 FINDINGS: Endotracheal tube and nasogastric tube have been removed. Central catheter tip is at the cavoatrial junction. No pneumothorax. There is bibasilar airspace consolidation with small bilateral effusions. There is a calcified  granuloma in the left upper lobe. There is no new opacity. Heart is prominent with pulmonary vascularity within normal limits. No change in cardiac silhouette. No adenopathy. IMPRESSION: Stable airspace consolidation with small effusions in the bases. Stable left upper lobe granuloma. No new opacity. No change in cardiac silhouette. No pneumothorax. Electronically Signed   By: Lowella Grip III M.D.   On: 06/19/2015 07:38   Dg Abd Portable 1v  06/18/2015  CLINICAL DATA:  Orogastric tube placement EXAM: PORTABLE ABDOMEN - 1 VIEW COMPARISON:  06/14/2015 FINDINGS: The orogastric tube curls in the distal esophagus. The tip is directed cephalad and is above the included field of view. IMPRESSION: 1. Orogastric tube does not into the stomach. It extends to the distal esophagus where it curls upon itself. The tip is above the included field of view. Electronically Signed   By: Lajean Manes M.D.   On: 06/18/2015 18:32     Medications:   . sodium chloride Stopped (06/18/15 1832)  . dextrose 50 mL/hr at 06/19/15 1702  . heparin 1,050 Units/hr (06/19/15 1945)   . antiseptic oral rinse  7 mL Mouth Rinse QID  . carvedilol  3.125 mg Per Tube BID WC  . chlorhexidine gluconate  15 mL Mouth Rinse BID  . fentaNYL (SUBLIMAZE) injection  50 mcg Intravenous Once  . hydrALAZINE  10 mg Intravenous Q6H  . insulin aspart  0-9 Units Subcutaneous 6 times per day  . lamoTRIgine  100 mg Oral BID  . latanoprost  1 drop Both Eyes QHS  . metoprolol  2.5 mg Intravenous 3 times per day  . nitroGLYCERIN  1 inch Topical 3 times per day  . nystatin   Topical TID  . pantoprazole (PROTONIX) IV  40 mg Intravenous Q24H  . pyridostigmine  10 mg Intravenous TID  . spironolactone  25 mg Oral Daily   albuterol, ALPRAZolam, fentaNYL (SUBLIMAZE) injection, ipratropium-albuterol, metoprolol, ondansetron **OR** ondansetron (ZOFRAN) IV, senna  Assessment/ Plan:  [name] is a 69 y.o. white female with coronary artery  disease, atrial fibrillation, hypertension, hyperlipidemia, diabetes mellitus type II, COPD, tremor, systolic congestive heart failure, depression, anxiety, dementia, bipolar disease who was admitted on 06/08/2015   1. 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 Chronic Kidney Disease with proteinuria secondary to diabetic nephropathy and renal artery stenosis. Left kidney atrophy on CT 05/21/2015.  - Kidney function continues to improve, would continue to monitor Cr trend.  2. Hypernatremia: Na down to 149 this AM, continue D5W for now.   3. Renal mass: right: seen on CT. Ultrasound found these to be simple cysts.   4. Acute respiratory failure:  Extubated 06/18/15. Seems to be progressing well off of the ventilator, will  continue to monitor her respiratory status.   LOS: 11 Mackenzie Rose 10/12/20168:14 AM

## 2015-06-20 NOTE — Consult Note (Signed)
Palliative Medicine Inpatient Consult Note   Name: Mackenzie Rose Date: 06/20/2015 MRN: [id number]  DOB: [birth year]  Referring Physician: [name], MD  Palliative Care consult requested for this 69 y.o. female for goals of medical therapy in patient with a history of acute respiratory failure.     History: She is a resident of an [facility] and has not had much family support or presence.  She has one son in [place] and one sister who is local but who has her own health problems. This sister came here earlier today per nurse report.    She has had three recent hospitalizations and they are reviewed by me today.  She was here from Sept 9-11, 2016 after she fell at the facility where she resides. She hit the back of her head and had a laceration there.  She had mild troponin elevated thought to be demand ischemia. She was on Cipro for a UTI and had low potassium. She had some medications adjusted.  She was continued on her medications for Alzheimers, COPD, and Myasthenia Gravis (pyridostigmine and prednisone).     She was readmitted just after being discharged on the 11th because her Hgb was found to be low at 5.8. She had upper endoscopy revealing [facility] Ulcers (ulcers where a Hiatal hernia is constricted by the diaphragm).  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  She was prescribed Protonix bid. Home health Nursing and Pt were ordered. She was constipated but when this was addressed, she was discharged back to the family care home. She had protested her discharge due to inability to walk, but had been seen ambulating with a cane without difficulty and PT found she was mostly 'self-limiting' her activity level. She had an outpt appt with [facility] for follow up of her iron deficiency anemia and she was given a dose of IV iron.    This time,on Oct 1, she came  in short of breath. BNP was high at 2500. She was in AFib and her h/o AFib was noted to be part of her record since the 1970's but she was not on an anticoagulant and had not been placed on one after her fall due to fall risk (though she was in Afib).   She was treated with lasix for CHF and with respiratory meds and BIPAP but had to be intubated   Sept 9th -11th from the nursing facility after a fall patient related to weakness.  She denied loss of consciousness but it is unclear if this is the case or not.  She suffered a laceration to her occiput.  She was hypokalemic and was being treated with Cipro for a UTI.  Her Afib was rate controlled at admission. She was on a lot of medications for COPD and coughing at admission. She is known to have a degree of Alzheimer's Dementia and is on [facility]. And sleep meds.    Sept 12 - 14 she was admitted with a  Hgb of 5.8 felt to be at least partly dliutional.  Had N/V  ? Melanotic stool.  Wanted a rehab facility.  Dementia hampered history. Gi did upper endo showing large HH and [facility] ulcers and Protonix bid was Rxd and she got transfused 2 units of PRBCs.  DCd back to ALF with Home Health [facility])  Oct 1 Admitted Short of breath.  Had intermittent chest pain. Has  had history in past of CHF but no CAD. Was on bipap. BNP was high at 2500 and CXR showed CHF.  Troponin was elevated to 0.12.  She has COPD and CHF. An echo showed EF 35-40% (EF was 55-60% inj July 2016).  She improved over a few days and had a cardiology consult for her mildly elevated troponins.  She was weaned off of BIPAP. She complained of some blurred vision which is one of her Myasthenia symptoms (along with vertigo).  Pt was back on BIPAP by Oct 2nd but wanted to wear the nasal cannula and BIPAP at the same time.  She was intubated in the evening of 10/ 3 and extubated on 10/4 to 6 LPM [facility].  She then required re-intubation on 10/6 due to aspiration pneumonia. A Palliative Consult request was  placed on this date, but after talking with attending, we had agreed that given that son wants pt to remain full code for now, I would hold off on consult till she was extubated and then reassess.  She was then extubated a second time on 10/10.     IMPRESSION: Acute hypoxic respiratory failure --recurrent and due to CHF and COPD and poor airway clearance --likely due to myasthenia gravis COPD Current tobacco smoker (2ppd for 50 yrs) Acute Systolic CHF  ---EF 62-70% with mod MR and Grade 1 Diastolic Dysfxn and WMA's seen.  Prediabetic Dyslipidemia History of melanoma Myasthenia Gravis Alzheimer's Dementia Migraine Headaches Bipolar Disorder Acute on Chronic Renal Failure with CKD stage III with proteinuria  --due to diabetic nephropathy and renal artery stenosis Falls AFib with diagnosis made in 1970's but not on long term anticoagulation due to no recent clear documentation of Afib.  Essential HTN PUD diagnosed with upper endo Sept13, 2016 ([facility]) ---treated with protonix bid Dysphagia ---due to poor mental status on 10/5 --given Dysphagia 1 with Honey thick liquids ---SLP to evaluate again on 10/13. DM2  Dependent edema] Candidal skin rach of right thigh and groin area. 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 Also, pts sister had shown up  and I had wanted to meet her.    Pt is telling staff she wants to remain a full code 'for research purposes'   I  feel her dementia may mean that she has a poor understanding of what a code status means and how it would not be related in any way to 'research'.   I have talked at some length with SLP today about pts swallowing.  It seems that based on SLP's observations, some of pt's symptoms could possibly be related to her MG.  It would be a good idea to refer her to her neurologist as soon as she can get in for an appt (if she is going to be DCd to SNF soon) --OR obtain a Neuro consult here.  Without improvement in swallowing, she is at very high risk of relapse and readmission. Pts routine meds for MG may not be adequate.    Her 100 pk yr h/o tobacco smoking  with COPD means she is NOT an elective surgical candidate.   Her severe hiatal hernia and intrathoracic stomach is interfering with her swallowing.  This could actually be surgically repostioned and repaired -- if she was a surgical candidate. But she isn't. Which means aspiration is going to be frequent.  And frequent aspiration brings up the consideration of a PEG.  But she isn't really  a candidate for a PEG with an intrathoracic stomach.  So she is in a 'catch 22 situation'   Dementia mixed with bipolar disorder and poor insight means that she is going to continue to make poor decisions.  I will talk with her when she is more awake --having missed the opportunity earlier today when called to another pt's room.    REVIEW OF SYSTEMS:  Patient is not able to provide ROS due to starting to be short of breath again. Unable to talk at [place]: Yes.  SOCIAL HISTORY:  reports that she has been smoking Cigarettes.  She started smoking about 50 Rose ago. She has a 100 pack-year smoking history. She has never used smokeless tobacco. She reports that she does not drink alcohol or use illicit drugs.  LEGAL DOCUMENTS:  None  CODE STATUS: Full code  Per pt and son's expresed wishes so far.   PAST MEDICAL HISTORY: Past Medical History   Diagnosis Date  . Heart disease   . Atrial fibrillation ([place])     a. reported a-fib; b. unknown chronicity; c. dates back to 1970s; d. not on long term anticoagulation 2/2 no documented a-fib  . Hypertension   . High cholesterol   . Diabetes ([place])     borderline  . COPD (chronic obstructive pulmonary disease) ([place])   . Melanoma ([place])   . Kidney disorder   . Tremor   . Autoimmune disease ([place])   . Depressed   . Anxiety disorder   . Migraine   . Dementia   . Motion sickness   . Myasthenia gravis ([place])   . Bipolar disorder ([place])   . Dementia   . Acute renal failure ([place])   . Near syncope     PAST SURGICAL HISTORY:  Past Surgical History  Procedure Laterality Date  . Tubal ligation    . Retena repair    . Cataract extraction    . Cholecystectomy    . Esophagogastroduodenoscopy (egd) with propofol N/A 05/22/2015    Procedure: ESOPHAGOGASTRODUODENOSCOPY (EGD) WITH PROPOFOL;  Surgeon: Lucilla Lame, MD;  Location: [facility] ENDOSCOPY;  Service: Endoscopy;  Laterality: N/A;    ALLERGIES:  is allergic to lactose intolerance (gi); lithium; and penicillins.  MEDICATIONS:  Current Facility-Administered Medications  Medication Dose Route Frequency Provider Last Rate Last Dose  . albuterol (PROVENTIL) (2.5 MG/3ML) 0.083% nebulizer solution 3 mL  3 mL Inhalation Q4H PRN Lance Coon, MD   3 mL at 06/13/15 1604  . ALPRAZolam Duanne Moron) tablet 0.25 mg  0.25 mg Oral TID PRN Mackenzie Leber, MD   0.25 mg at 06/20/15 0003  . antiseptic oral rinse solution (CORINZ)  7 mL Mouth Rinse QID Fritzi Mandes, MD   7 mL at 06/20/15 0400  . carvedilol (COREG) tablet 3.125 mg  3.125 mg Per Tube BID WC Charlett Nose, RPH   3.125 mg at 06/20/15 1813  . chlorhexidine gluconate (PERIDEX) 0.12 % solution 15 mL  15 mL Mouth Rinse BID Fritzi Mandes, MD   15 mL at 06/19/15 2000  . dextrose 5 % solution  Intravenous Continuous Mackenzie Leber, MD 50 mL/hr at 06/20/15 1812    . heparin ADULT infusion 100 units/mL (25000  units/250 mL)  1,050 Units/hr Intravenous Continuous Mackenzie Leber, MD 10.5 mL/hr at 06/19/15 1945 1,050 Units/hr at 06/19/15 1945  . hydrALAZINE (APRESOLINE) injection 10 mg  10 mg Intravenous Q6H Wilhelmina Mcardle, MD      . insulin aspart (novoLOG) injection 0-9 Units  0-9 Units Subcutaneous 6 times per day Fritzi Mandes, MD   1 Units at 06/20/15 0359  . ipratropium-albuterol (DUONEB) 0.5-2.5 (3) MG/3ML nebulizer solution 3 mL  3 mL Nebulization Q4H PRN Lance Coon, MD   3 mL at 06/20/15 1832  . lamoTRIgine (LAMICTAL) tablet 100 mg  100 mg Oral BID Wilhelmina Mcardle, MD   100 mg at 06/20/15 1216  . latanoprost (XALATAN) 0.005 % ophthalmic solution 1 drop  1 drop Both Eyes QHS Lance Coon, MD   1 drop at 06/19/15 2117  . metoprolol (LOPRESSOR) injection 2.5-5 mg  2.5-5 mg Intravenous Q4H PRN Wilhelmina Mcardle, MD      . metoprolol tartrate (LOPRESSOR) tablet 25 mg  25 mg Oral BID Mackenzie Leber, MD   25 mg at 06/20/15 1434  . nitroGLYCERIN (NITROGLYN) 2 % ointment 1 inch  1 inch Topical 3 times per day Harrie Foreman, MD   1 inch at 06/20/15 1216  . nystatin (MYCOSTATIN/NYSTOP) topical powder   Topical TID Fritzi Mandes, MD      . ondansetron [facility]) tablet 4 mg  4 mg Oral Q6H PRN Lance Coon, MD       Or  . ondansetron [facility]) injection 4 mg  4 mg Intravenous Q6H PRN Lance Coon, MD   4 mg at 06/09/15 1149  . pantoprazole (PROTONIX) injection 40 mg  40 mg Intravenous Q24H Wilhelmina Mcardle, MD   40 mg at 06/20/15 1216  . pyridostigmine (MESTINON) injection 10 mg  10 mg Intravenous TID Vaughan Basta, MD   10 mg at 06/20/15 1434  . senna (SENOKOT) tablet 8.6 mg  1 tablet Per Tube BID PRN Laverle Hobby, MD      . spironolactone (ALDACTONE) tablet 25 mg  25 mg Oral Daily Wilhelmina Mcardle, MD   25 mg at 06/20/15 1216    Vital Signs: BP 156/68 mmHg  Pulse 70  Temp(Src) 98.3 F (36.8 C) (Oral)  Resp 20  Ht 5\' 4"  (1.626 m)  Wt 60.4 kg (133 lb 2.5 oz)  BMI 22.85 kg/m2  SpO2  94% Filed Weights   06/18/15 0557 06/19/15 0400 06/20/15 0559  Weight: 58.4 kg (128 lb 12 oz) 59.1 kg (130 lb 4.7 oz) 60.4 kg (133 lb 2.5 oz)    Estimated body mass index is 22.85 kg/(m^2) as calculated from the following:   Height as of this encounter: 5\' 4"  (1.626 m).   Weight as of this encounter: 60.4 kg (133 lb 2.5 oz).  PERFORMANCE STATUS (ECOG) : 3 - Symptomatic, >50% confined to bed  PHYSICAL EXAM: Resting/sleepig NAD Hrt rrr no mgr Lungs decreased BS bases no wheezing Abd soft and NT Ext no cyanosis or mottling.       LABS: CBC:    Component Value Date/Time   WBC 10.8 06/20/2015 0443   WBC 10.9 12/17/2014 0422   HGB 9.9* 06/20/2015 0443   HGB 9.1* 12/17/2014 0422   HCT 32.5* 06/20/2015 0443   HCT 30.0* 12/17/2014 0422   PLT 261 06/20/2015 0443   PLT 192 12/17/2014 0422  MCV 82.6 06/20/2015 0443   MCV 78* 12/17/2014 0422   NEUTROABS 10.7* 04/25/2015 2104   NEUTROABS 9.9* 12/17/2014 0422   LYMPHSABS 0.8* 04/25/2015 2104   LYMPHSABS 0.4* 12/17/2014 0422   MONOABS 0.6 04/25/2015 2104   MONOABS 0.6 12/17/2014 0422   EOSABS 0.0 04/25/2015 2104   EOSABS 0.0 12/17/2014 0422   BASOSABS 0.0 04/25/2015 2104   BASOSABS 0.0 12/17/2014 0422   Comprehensive Metabolic Panel:    Component Value Date/Time   NA 149* 06/20/2015 0443   NA 138 12/17/2014 0422   K 3.4* 06/20/2015 0443   K 4.6 12/17/2014 0422   CL 112* 06/20/2015 0443   CL 106 12/17/2014 0422   CO2 30 06/20/2015 0443   CO2 28 12/17/2014 0422   BUN 60* 06/20/2015 0443   BUN 46* 12/17/2014 0422   CREATININE 1.51* 06/20/2015 0443   CREATININE 1.29* 12/17/2014 0422   GLUCOSE 113* 06/20/2015 0443   GLUCOSE 88 12/17/2014 0422   CALCIUM 8.8* 06/20/2015 0443   CALCIUM 8.6* 12/17/2014 0422   AST 26 06/08/2015 2310   AST 21 12/12/2014 2044   ALT 18 06/08/2015 2310   ALT 13* 12/12/2014 2044   ALKPHOS 114 06/08/2015 2310   ALKPHOS 76 12/12/2014 2044   BILITOT 0.7 06/08/2015 2310   BILITOT 0.1*  12/12/2014 2044   PROT 6.1* 06/08/2015 2310   PROT 5.9* 12/12/2014 2044   ALBUMIN 2.2* 06/18/2015 0339   ALBUMIN 3.0* 12/12/2014 2044     More than 50% of the visit was spent in counseling/coordination of care: Yes  Time Spent: 80 minutes

## 2015-06-20 NOTE — Progress Notes (Signed)
Report called to [facility] on 1C. Patient to move to room 119.

## 2015-06-20 NOTE — Progress Notes (Signed)
Speech Therapy Note: reviewed chart notes and consulted MD. Pt continues to present w/ wet vocal quality intermittently, cough, and concern for dysphagia. Pt exhibited decreased toleration of po's during recent ST assessment. Due to concern for dysphagia sec to Myasthenia Gravis, as well as extended illness, MD agreed w/ an objective swallow study for tomorrow. ST placed order and will f/u then. NSG updated. Continue to rec. meds in puree; single ice chips w/ strict aspiration precautions for pleasure and w/ supervision.

## 2015-06-20 NOTE — Progress Notes (Signed)
No distress. Wet quality to voice. Stronger cough today  Filed Vitals:   06/20/15 0838 06/20/15 0900 06/20/15 1041 06/20/15 1223  BP:  135/86 153/90 171/85  Pulse:  79 84   Temp:   97.8 F (36.6 C)   TempSrc:   Oral   Resp:  22 20   Height:      Weight:      SpO2: 100% 100% 99%    NAD HEENT WNL No JVD Few scattered rhonchi Reg, no M NABS, soft No edema Diffusely weak, no focal deficits  BMP Latest Ref Rng 06/20/2015 06/19/2015 06/18/2015  Glucose 65 - 99 mg/dL 113(H) 137(H) 275(H)  BUN 6 - 20 mg/dL 60(H) 82(H) 87(H)  Creatinine 0.44 - 1.00 mg/dL 1.51(H) 1.62(H) 1.73(H)  Sodium 135 - 145 mmol/L 149(H) 152(H) 149(H)  Potassium 3.5 - 5.1 mmol/L 3.4(L) 3.9 4.4  Chloride 101 - 111 mmol/L 112(H) 111 105  CO2 22 - 32 mmol/L 30 32 33(H)  Calcium 8.9 - 10.3 mg/dL 8.8(L) 9.3 9.0    CBC Latest Ref Rng 06/20/2015 06/19/2015 06/18/2015  WBC 3.6 - 11.0 K/uL 10.8 16.0(H) 14.4(H)  Hemoglobin 12.0 - 16.0 g/dL 9.9(L) 11.0(L) 9.6(L)  Hematocrit 35.0 - 47.0 % 32.5(L) 36.8 30.8(L)  Platelets 150 - 440 K/uL 261 329 249    CXR: NNF  IMPRESSION: VDRF, extubated 10/10 Marginal cough mechanics due to myasthenia - improving Suspect dysphagia Concern for aspiration PNA - S/P 7 days abx. Unasyn DC'd 10/11 Atelectasis Systolic CHF (LVEF 72-62%) CKD, stage III - Cr improved today Severe hypernatremia - improving Myasthenia gravis Chronic PPI use   PLAN/REC: SLP eval appreciated. Advance diet per their recs Transfer to med-surg today Monitor BMET intermittently Monitor I/Os Correct electrolytes as indicated Cont D5W Follow CXR intermittently Cont IV PPI - changed to PO when able  PCCM will sign off. Please call if we can be of further assistance  Merton Border, MD PCCM service Mobile [phone] Pager [phone]

## 2015-06-20 NOTE — Care Management Important Message (Signed)
Important Message  Patient Details  Name: Mikell Camp MRN: [id number] Date of Birth: 1946-02-12   Medicare Important Message Given:  Yes-second notification given    Shelbie Ammons, RN 06/20/2015, 2:14 PM

## 2015-06-20 NOTE — Progress Notes (Signed)
[place] [facility] [place] NAME: Mackenzie Rose    MR#:  [id number]  DATE OF BIRTH:  10-08-1945  SUBJECTIVE:   Pt. Extubated yesterday and clinically improving.  Remains nothing by mouth at high-risk for feeding presently. Still has a cough with productive sputum. Family at bedside.       Review of Systems  Constitutional: Negative for fever and chills.  HENT: Negative for congestion and tinnitus.   Eyes: Negative for blurred vision and double vision.  Respiratory: Positive for cough and sputum production. Negative for shortness of breath and wheezing.   Cardiovascular: Negative for chest pain, orthopnea and PND.  Gastrointestinal: Negative for nausea, vomiting, abdominal pain and diarrhea.  Genitourinary: Negative for dysuria and hematuria.  Neurological: Positive for weakness (generalized). Negative for dizziness, sensory change and focal weakness.  All other systems reviewed and are negative.  Tolerating Diet: Evaluated by speech and currently nothing by mouth.  Tolerating PT: Evaluation noted.   DRUG ALLERGIES:   Allergies  Allergen Reactions  . Lactose Intolerance (Gi) Other (See Comments)    Reaction:  GI upset   . Lithium Other (See Comments)    Reaction:  Unknown   . Penicillins Rash    Patient tolerated Ampicillin-Sulbactam     VITALS:  Blood pressure 163/95, pulse 88, temperature 97.8 F (36.6 C), temperature source Oral, resp. rate 20, height 5\' 4"  (1.626 m), weight 60.4 kg (133 lb 2.5 oz), SpO2 99 %.  PHYSICAL EXAMINATION:   Physical Exam  GENERAL:  69 y.o.-year-old patient lying in the bed globally weak and lethargic  EYES: pupils are equally reactive to light and accommodation. No scleral icterus. HEENT: Head atraumatic, normocephalic. Oropharynx and nasopharynx clear.   NECK:  Supple, no jugular venous distention. No thyroid enlargement, no tenderness.  LUNGS: Poor respiratory effort. no rales, no rhonchi no  wheezes  No use of accessory muscles of respiration.  CARDIOVASCULAR: S1, S2 normal. No murmurs, rubs, or gallops.  ABDOMEN: Soft, nontender, nondistended. Bowel sounds present. No organomegaly or mass.  EXTREMITIES: No cyanosis, clubbing, +1 edema bilaterally.  Right upper extremity edema and warmth. NEUROLOGIC: Globally weak but no other focal motor or sensory gestation bilaterally  PSYCHIATRIC: alert, oriented x 3.   SKIN: No ulcer, rash, lesions  LABORATORY PANEL:   CBC  Recent Labs Lab 06/20/15 0443  WBC 10.8  HGB 9.9*  HCT 32.5*  PLT 261    Chemistries   Recent Labs Lab 06/20/15 0443  NA 149*  K 3.4*  CL 112*  CO2 30  GLUCOSE 113*  BUN 60*  CREATININE 1.51*  CALCIUM 8.8*    Cardiac Enzymes No results for input(s): TROPONINI in the last 168 hours.  RADIOLOGY:  Dg Chest Port 1 View  06/19/2015  CLINICAL DATA:  Respiratory failure EXAM: PORTABLE CHEST 1 VIEW COMPARISON:  June 16, 2015 FINDINGS: Endotracheal tube and nasogastric tube have been removed. Central catheter tip is at the cavoatrial junction. No pneumothorax. There is bibasilar airspace consolidation with small bilateral effusions. There is a calcified granuloma in the left upper lobe. There is no new opacity. Heart is prominent with pulmonary vascularity within normal limits. No change in cardiac silhouette. No adenopathy. IMPRESSION: Stable airspace consolidation with small effusions in the bases. Stable left upper lobe granuloma. No new opacity. No change in cardiac silhouette. No pneumothorax. Electronically Signed   By: Lowella Grip III M.D.   On: 06/19/2015 07:38   Dg Abd Portable 1v  06/18/2015  CLINICAL DATA:  Orogastric tube placement EXAM: PORTABLE ABDOMEN - 1 VIEW COMPARISON:  06/14/2015 FINDINGS: The orogastric tube curls in the distal esophagus. The tip is directed cephalad and is above the included field of view. IMPRESSION: 1. Orogastric tube does not into the stomach. It extends to the  distal esophagus where it curls upon itself. The tip is above the included field of view. Electronically Signed   By: Lajean Manes M.D.   On: 06/18/2015 18:32     ASSESSMENT AND PLAN:  69 y.o. female who presents with shortness of breath, worsening over the last 3-4 days. Patient states that she's also had some intermittent chest pains throughout the same time. She states that she's had some episodes of acute CHF in the past, but denies being chronically followed for CHF  1. acute hypoxic respiratory failure secondary to Acute systolic CHF (congestive heart failure) and Aspiration PNA -Patient was transferred to ICU given impending respiratory failure got intubated and on the ventilator October 3-->extubated 10/4-->re intubated October 6.--->extubated oct 10th -improved w/ diuresis and will cont. Low dose lasix.    -echo shows EF 35-40% (was 55-60% on July 2016) -Continue aggressive pulmonary toileting.  2. Acute on Chronic RF (acute renal failure) CKD-III -Creatinine still 1.5 and close to baseline. Continue to monitor -Renal ultrasound bilateral kidney cysts.  3. Myasthenia gravis - no acute issue. - cont. Pyridostigmine  4.HTN (hypertension)-stable  -continue coreg, hydralazine, spironolactone  5.Type 2 diabetes mellitus-blood sugar stable and continue sliding scale insulin   6.COPD (chronic obstructive pulmonary disease) - continue duo nebs.    7.h/o chronic afib -Rate controlled and continue on coreg.  -High risk for falls and therefore not on anticoagulation.  8.Bipolar affective disorder, mixed - continue Lamictal  9. Right groin skin rash appears candidial-much improved -Continue Nystatin powder   10. Right upper extremity DVT-continue heparin drip. - will switch to Oral meds once taking PO.    Seen by PT and they recommend SNF.  Speech to reeval pt. Today to start diet possibly today and she may need MBS tomorrow.       CODE STATUS:FULL  DVT Prophylaxis:  lovenox  TOTAL TIME Spent in CARE OF THIS PATIENT: 30 minutes.    [name] M.D on 06/20/2015 at 9:03 AM  Between 7am to 6pm - Pager - [phone]  After 6pm go to [email or web address] - password [email or web address] [facility]  Office  [phone]  CC: Primary care physician; Donato Schultz, MD

## 2015-06-20 NOTE — Evaluation (Signed)
Physical Therapy Evaluation Patient Details Name: Mackenzie Rose MRN: [id number] DOB: 10-02-1945 Today's Date: 06/20/2015   History of Present Illness  69 yo female with onset of SOB and chest pain was admitted from SNF, planning to return.  PMHx:  a-fib, COPD, HTN, acute respiratory failure, DM, bippolar, myasthenia gravis  Clinical Impression  Pt was seen for evaluation of her general weakness and limited activity tolerance that results in being very debilitated now.  Pt can only get to bedside with nearly total assist, was walking per previous notes on chart.  Her plan is to return to SNF and continue to strengthen, where hopefully more detail is known about PLOF.    Follow Up Recommendations SNF    Equipment Recommendations  None recommended by PT    Recommendations for Other Services       Precautions / Restrictions Precautions Precautions: Fall Restrictions Weight Bearing Restrictions: No      Mobility  Bed Mobility Overal bed mobility: Needs Assistance Bed Mobility: Supine to Sit;Sit to Supine     Supine to sit: Total assist Sit to supine: Total assist   General bed mobility comments: Pt pulled toward staying in bed and then again once sitting  Transfers Overall transfer level: Needs assistance Equipment used: None (Pt too weak to attempt)                Ambulation/Gait             General Gait Details: unable, O2 sats declined sitting  Stairs            Wheelchair Mobility    Modified Rankin (Stroke Patients Only)       Balance Overall balance assessment: Needs assistance Sitting-balance support: Bilateral upper extremity supported (Mod assist to sit by PT) Sitting balance-Leahy Scale: Poor                                       Pertinent Vitals/Pain Pain Assessment: No/denies pain    Home Living Family/patient expects to be discharged to:: [place]:   (declined to answer questions about home, repeating SNF)      Prior Function Level of Independence:  (information unavailable)   Gait / Transfers Assistance Needed:  (no information)  ADL's / Homemaking Assistance Needed: Assist with bathing        Hand Dominance        Extremity/Trunk Assessment   Upper Extremity Assessment: Generalized weakness           Lower Extremity Assessment: Generalized weakness      Cervical / Trunk Assessment: Kyphotic  Communication   Communication: No difficulties  Cognition Arousal/Alertness: Lethargic Behavior During Therapy: Flat affect Overall Cognitive Status: History of cognitive impairments - at baseline       Memory: Decreased recall of precautions;Decreased short-term memory              General Comments General comments (skin integrity, edema, etc.): Pt is perseverating on the plan for SNF and does not provide any details about home when asked specific questions.    Exercises        Assessment/Plan    PT Assessment Patient needs continued PT services  PT Diagnosis Generalized weakness;Altered mental status   PT Problem List Decreased strength;Decreased range of motion;Decreased activity tolerance;Decreased balance;Decreased mobility;Decreased coordination;Decreased cognition;Decreased  knowledge of use of DME;Decreased safety awareness;Decreased knowledge of precautions;Cardiopulmonary status limiting activity;Decreased skin integrity  PT Treatment Interventions DME instruction;Gait training;Functional mobility training;Therapeutic activities;Therapeutic exercise;Balance training;Neuromuscular re-education;Patient/family education   PT Goals (Current goals can be found in the Care Plan section) Acute Rehab PT Goals Patient Stated Goal: none stated PT Goal Formulation: Patient unable to participate in goal setting Time For Goal Achievement: 07/04/15 Potential to Achieve Goals: Fair    Frequency Min 2X/week    Barriers to discharge Other (comment) (dense weakness will require assist of 2)      Co-evaluation               End of Session Equipment Utilized During Treatment: Oxygen Activity Tolerance: Patient limited by fatigue;Patient limited by lethargy Patient left: in bed;with call bell/phone within reach;with nursing/sitter in room Nurse Communication: Mobility status         Time: 0802-0826 PT Time Calculation (min) (ACUTE ONLY): 24 min   Charges:   PT Evaluation $Initial PT Evaluation Tier I: 1 Procedure PT Treatments $Therapeutic Activity: 8-22 mins   PT G Codes:        Ramond Dial Jul 02, 2015, 9:05 AM   Mee Hives, PT MS Acute Rehab Dept. Number: [facility] [phone] and [place] [phone]

## 2015-06-20 NOTE — Progress Notes (Signed)
Clinical Education officer, museum (CSW) contacted [employer] at [facility] and made her aware that patient was extubated and transferred to 1C. Plan is for patient to D/C to [facility]. CSW will continue to follow and assist as needed.   Blima Rich, [place] [phone]

## 2015-06-20 NOTE — Plan of Care (Addendum)
Problem: Discharge Progression Outcomes Goal: Other Discharge Outcomes/Goals Outcome: Progressing Patient moved to room 119 from ccu today, going for repeat speech evaluation tomorrow,  VSS since transfer, getting continuous heparin and dextrose, + DVT in left arm, non productive cough, swallowing pills one at a time in applesauce, started feeling a little more short of breath around 6pm respiratory visited and gave PRN breathing treatment Patient reports improvement saturations remain in mid 90's

## 2015-06-20 NOTE — Progress Notes (Signed)
ANTICOAGULATION CONSULT NOTE - Follow Up Consult  Pharmacy Consult for Heparin Indication: DVT  Allergies  Allergen Reactions  . Lactose Intolerance (Gi) Other (See Comments)    Reaction:  GI upset   . Lithium Other (See Comments)    Reaction:  Unknown   . Penicillins Rash    Patient tolerated Ampicillin-Sulbactam     Patient Measurements: Height: 5\' 4"  (162.6 cm) Weight: 130 lb 4.7 oz (59.1 kg) IBW/kg (Calculated) : 54.7 Heparin Dosing Weight:   Vital Signs: Temp: 98.4 F (36.9 C) (10/12 0000) Temp Source: Oral (10/12 0000) BP: 125/70 mmHg (10/12 0300) Pulse Rate: 78 (10/12 0300)  Labs:  Recent Labs  06/18/15 0339 06/19/15 0119  06/19/15 1316 06/19/15 2125 06/20/15 0443  HGB 9.6* 11.0*  --   --   --  9.9*  HCT 30.8* 36.8  --   --   --  32.5*  PLT 249 329  --   --   --  261  HEPARINUNFRC 0.53  --   < > 0.61 0.65 0.58  CREATININE 1.77*  1.73* 1.62*  --   --   --  1.51*  < > = values in this interval not displayed.  Estimated Creatinine Clearance: 29.5 mL/min (by C-G formula based on Cr of 1.51).   Medications:  Prescriptions prior to admission  Medication Sig Dispense Refill Last Dose  . acetaminophen (TYLENOL) 650 MG CR tablet Take 650 mg by mouth daily. Pt takes daily at 2pm and every four hours as needed for pain.   Taking  . albuterol (PROVENTIL HFA;VENTOLIN HFA) 108 (90 BASE) MCG/ACT inhaler Inhale 2 puffs into the lungs every 4 (four) hours as needed for wheezing or shortness of breath. 6.7 g 1 Taking  . bismuth subsalicylate (PEPTO BISMOL) 262 MG/15ML suspension Take 5 mLs by mouth 4 (four) times daily as needed for diarrhea or loose stools (or nausea).   Taking  . busPIRone (BUSPAR) 10 MG tablet Take 10 mg by mouth 2 (two) times daily.    Taking  . Dextromethorphan-Benzocaine (SORE THROAT & COUGH LOZENGES MT) Use as directed 1 lozenge in the mouth or throat 4 (four) times daily as needed (for cough).   Taking  . diclofenac sodium (VOLTAREN) 1 % GEL Apply  2 g topically 2 (two) times daily.    Taking  . diltiazem (CARDIZEM CD) 180 MG 24 hr capsule Take 180 mg by mouth daily.    Taking  . donepezil (ARICEPT) 10 MG tablet Take 1 tablet (10 mg total) by mouth daily. 30 tablet 3 Taking  . doxycycline (VIBRAMYCIN) 50 MG capsule Take 1 capsule by mouth 2 (two) times daily.     . ferrous sulfate 325 (65 FE) MG tablet Take 1 tablet (325 mg total) by mouth 2 (two) times daily with a meal. 60 tablet 3 Taking  . Fluticasone-Salmeterol (ADVAIR) 250-50 MCG/DOSE AEPB Inhale 1 puff into the lungs 2 (two) times daily.   Taking  . glimepiride (AMARYL) 1 MG tablet Take 1 tablet by mouth 2 (two) times daily.     Marland Kitchen guaifenesin (ROBITUSSIN) 100 MG/5ML syrup Take 100 mg by mouth 2 (two) times daily as needed for cough.   Taking  . hydrALAZINE (APRESOLINE) 50 MG tablet Take 1 tablet (50 mg total) by mouth 3 (three) times daily. 90 tablet 0 Taking  . lamoTRIgine (LAMICTAL) 100 MG tablet Take 100 mg by mouth 2 (two) times daily.   Taking  . latanoprost (XALATAN) 0.005 % ophthalmic solution Place 1  drop into both eyes at bedtime.    Taking  . nicotine (NICODERM CQ - DOSED IN MG/24 HR) 7 mg/24hr patch Place 1 patch (7 mg total) onto the skin daily. 15 patch 0 Taking  . ondansetron (ZOFRAN) 4 MG tablet Take 1 tablet (4 mg total) by mouth every 8 (eight) hours as needed for nausea or vomiting. 20 tablet 0 Taking  . pantoprazole (PROTONIX) 40 MG tablet Take 1 tablet (40 mg total) by mouth 2 (two) times daily before a meal. 60 tablet 2 Taking  . phenylephrine-shark liver oil-mineral oil-petrolatum (PREPARATION H) 0.25-3-14-71.9 % rectal ointment Place 1 application rectally 2 (two) times daily as needed for hemorrhoids.   Taking  . polyethylene glycol (MIRALAX / GLYCOLAX) packet Take 17 g by mouth daily as needed for mild constipation.   Taking  . potassium chloride SA (K-DUR,KLOR-CON) 20 MEQ tablet Take 20 mEq by mouth daily.   Taking  . predniSONE (DELTASONE) 10 MG tablet Take 30  mg by mouth every other day.   Taking  . pyridostigmine (MESTINON) 60 MG tablet Take 60 mg by mouth 3 (three) times daily.    Taking  . QUEtiapine (SEROQUEL) 200 MG tablet Take 1 tablet (200 mg total) by mouth at bedtime. 30 tablet 3 Taking  . sodium chloride (OCEAN) 0.65 % SOLN nasal spray Place 2 sprays into both nostrils daily as needed for congestion.    Taking  . traMADol (ULTRAM) 50 MG tablet Take 50 mg by mouth 2 (two) times daily as needed.   Taking  . traZODone (DESYREL) 50 MG tablet Take 1 tablet (50 mg total) by mouth at bedtime. 30 tablet 0 Taking    Assessment: 10/11 :    HL @ 13:00 = 0.61                HL @ 21:25 = 0.65  Goal of Therapy:  Heparin level 0.3-0.7 units/ml Monitor platelets by anticoagulation protocol: Yes   Plan:  Will continue this pt on current rate of 1050 units/hr. Will recheck HL on 10/12 with AM labs.   1012 0443 heparin level therapeutic, continue current rate and will order levels for tomorrow AM.   Laural Benes, Pharm.D.  Clinical Pharmacist 06/20/2015,5:39 AM

## 2015-06-21 ENCOUNTER — Inpatient Hospital Stay: Payer: Medicare Other

## 2015-06-21 LAB — CBC
HEMATOCRIT: 33.2 % — AB (ref 35.0–47.0)
HEMOGLOBIN: 10.1 g/dL — AB (ref 12.0–16.0)
MCH: 25.3 pg — AB (ref 26.0–34.0)
MCHC: 30.5 g/dL — AB (ref 32.0–36.0)
MCV: 83.1 fL (ref 80.0–100.0)
Platelets: 267 10*3/uL (ref 150–440)
RBC: 3.99 MIL/uL (ref 3.80–5.20)
RDW: 27 % — ABNORMAL HIGH (ref 11.5–14.5)
WBC: 12.7 10*3/uL — ABNORMAL HIGH (ref 3.6–11.0)

## 2015-06-21 LAB — GLUCOSE, CAPILLARY
GLUCOSE-CAPILLARY: 101 mg/dL — AB (ref 65–99)
GLUCOSE-CAPILLARY: 94 mg/dL (ref 65–99)
GLUCOSE-CAPILLARY: 101 mg/dL — AB (ref 65–99)
Glucose-Capillary: 105 mg/dL — ABNORMAL HIGH (ref 65–99)
Glucose-Capillary: 90 mg/dL (ref 65–99)
Glucose-Capillary: 95 mg/dL (ref 65–99)

## 2015-06-21 LAB — HEPARIN LEVEL (UNFRACTIONATED): Heparin Unfractionated: 0.42 IU/mL (ref 0.30–0.70)

## 2015-06-21 MED ORDER — BISACODYL 10 MG RE SUPP: 10.0000 mg | Freq: Every day | RECTAL | Status: DC | PRN

## 2015-06-21 MED ORDER — LORAZEPAM 2 MG/ML IJ SOLN
0.5000 mg | Freq: Four times a day (QID) | INTRAMUSCULAR | Status: DC | PRN
  Administered 2015-06-27: 0.5 mg via INTRAVENOUS
  Filled 2015-06-21: qty 1

## 2015-06-21 NOTE — Progress Notes (Addendum)
Palliative Medicine Inpatient Consult Follow Up Note   Name: Mackenzie Rose Date: 06/21/2015 MRN: [id number]  DOB: June 07, 1946  Referring Physician: [name], MD  Palliative Care consult requested for this 69 y.o. female for goals of medical therapy in patient with acute on chronic respiratory failure with a 100 pack /yr h/o tobacco smoking, aspiration pneumonia, severe dysphagia at very high risk of aspiration, large hiatal hernia with intrathoracic stomach, and dementia.    TODAY'S DISCUSSIONS AND DECISIONS: I spoke with pt's son, Terressa Koyanagi at # [phone]. The following has been decided after I provided many details about pts condition, prognosis, options, lack of options, etc:  1. We all agree that she cannot make her own healthcare decisions. She is not able to grasp or understand what is involved. We can talk with her about issues, but should call son always henceforth for decisions. Pt does NOT HAVE THE CAPACITY at this time to make her own healthcare decisions. She is likely not going to be able to make her own decisions going forward.  2.  He agrees that she should be DNR.  3.  He would like her to have a PEG placed IF that is an option.  4. He is aware that she might not be a PEG candidate given the intrathoracic stomach etc.  She is not a great surgical candidate at baseline due to her advanced COPD. But we will see what GI thinks.  5. He is aware there will be a neuro consult --and he was told this dysphagia is not likely related in any way to p'ts Myasthenia Gravis (though there will be a formal consult to address this as it has been a concern worth addressing).  IMPRESSION: Acute hypoxic respiratory failure --recurrent and due to CHF and COPD and poor airway clearance --likely due to myasthenia gravis COPD Current tobacco smoker (2ppd for 50 yrs) Acute Systolic CHF  ---EF 28-31% with mod MR and Grade 1 Diastolic Dysfxn and WMA's seen.   Prediabetic Dyslipidemia History of melanoma Myasthenia Gravis Alzheimer's Dementia Migraine Headaches Bipolar Disorder Acute on Chronic Renal Failure with CKD stage III with proteinuria  --due to diabetic nephropathy and renal artery stenosis Falls AFib with diagnosis made in 1970's but not on long term anticoagulation due to no recent clear documentation of Afib.  Essential HTN PUD diagnosed with upper endo Sept13, 2016 (Dr Allen Norris) ---treated with protonix bid DYSPHAGIA ---VERY HIGH ASPIRATION RISK. --NEEDS PEG PER slp IF SON DEISIRES (AND HE DOES WANT THIS FOR HER --IF IT IS AN OPTION) DM2  Dependent edema] Candidal skin rach of right thigh and groin area. 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 Heart disease   . Atrial fibrillation ([place])     a. reported a-fib; b. unknown chronicity; c. dates back to 1970s; d. not on long term anticoagulation 2/2 no documented a-fib  . Hypertension   . High cholesterol   . Diabetes ([place])     borderline  . COPD (chronic obstructive pulmonary disease) (  [place])   . Melanoma ([place])   . Kidney disorder   . Tremor   . Autoimmune disease ([place])   . Depressed   . Anxiety disorder   . Migraine   . Dementia   . Motion sickness   . Myasthenia gravis ([place])   . Bipolar disorder ([place])   . Dementia   . Acute renal failure ([place])   . Near syncope     PAST SURGICAL HISTORY:   Past Surgical History  Procedure Laterality Date  . Tubal ligation    . Retena repair    . Cataract extraction    . Cholecystectomy    . Esophagogastroduodenoscopy (egd) with propofol N/A 05/22/2015    Procedure: ESOPHAGOGASTRODUODENOSCOPY (EGD) WITH PROPOFOL;  Surgeon: Lucilla Lame, MD;  Location: [facility] ENDOSCOPY;  Service: Endoscopy;  Laterality: N/A;    Vital Signs: BP 120/56 mmHg  Pulse 77  Temp(Src) 98.5 F (36.9 C) (Oral)  Resp 18  Ht 5\' 4"  (1.626 m)  Wt 58.877 kg (129 lb 12.8 oz)  BMI 22.27 kg/m2  SpO2 90% Filed Weights   06/19/15 0400 06/20/15 0559 06/21/15 0504  Weight: 59.1 kg (130 lb 4.7 oz) 60.4 kg (133 lb 2.5 oz) 58.877 kg (129 lb 12.8 oz)    Estimated body mass index is 22.27 kg/(m^2) as calculated from the following:   Height as of this encounter: 5\' 4"  (1.626 m).   Weight as of this encounter: 58.877 kg (129 lb 12.8 oz).  PHYSICAL EXAM: Sedated now [employer] but is too lethargic to talk with Opens eyes and they are equal but she has some heavy eyelids Lips are moist (she continues on IV fluids) No JVD or TM Heart rrr no mgr Lungs with occasional ronchi Abd soft and nontender Ext no mottling or cyanosis  LABS: CBC:    Component Value Date/Time   WBC 12.7* 06/21/2015 0510   WBC 10.9 12/17/2014 0422   HGB 10.1* 06/21/2015 0510   HGB 9.1* 12/17/2014 0422   HCT 33.2* 06/21/2015 0510   HCT 30.0* 12/17/2014 0422   PLT 267 06/21/2015 0510   PLT 192 12/17/2014 0422   MCV 83.1 06/21/2015 0510   MCV 78* 12/17/2014 0422   NEUTROABS 10.7* 04/25/2015 2104   NEUTROABS 9.9* 12/17/2014 0422   LYMPHSABS 0.8* 04/25/2015 2104   LYMPHSABS 0.4* 12/17/2014 0422   MONOABS 0.6 04/25/2015 2104   MONOABS 0.6 12/17/2014 0422   EOSABS 0.0 04/25/2015 2104   EOSABS 0.0 12/17/2014 0422   BASOSABS 0.0 04/25/2015 2104   BASOSABS 0.0 12/17/2014 0422   Comprehensive Metabolic Panel:    Component Value Date/Time   NA 149* 06/20/2015 0443   NA 138 12/17/2014 0422   K 3.4*  06/20/2015 0443   K 4.6 12/17/2014 0422   CL 112* 06/20/2015 0443   CL 106 12/17/2014 0422   CO2 30 06/20/2015 0443   CO2 28 12/17/2014 0422   BUN 60* 06/20/2015 0443   BUN 46* 12/17/2014 0422   CREATININE 1.51* 06/20/2015 0443   CREATININE 1.29* 12/17/2014 0422   GLUCOSE 113* 06/20/2015 0443   GLUCOSE 88 12/17/2014 0422   CALCIUM 8.8* 06/20/2015 0443   CALCIUM 8.6* 12/17/2014 0422   AST 26 06/08/2015 2310   AST 21 12/12/2014 2044   ALT 18 06/08/2015 2310   ALT 13* 12/12/2014 2044   ALKPHOS 114 06/08/2015 2310   ALKPHOS 76 12/12/2014 2044   BILITOT 0.7 06/08/2015 2310   BILITOT 0.1* 12/12/2014 2044   PROT 6.1* 06/08/2015 2310   PROT  5.9* 12/12/2014 2044   ALBUMIN 2.2* 06/18/2015 0339   ALBUMIN 3.0* 12/12/2014 2044    More than 50% of the visit was spent in counseling/coordination of care: YES  Time Spent: 70 min

## 2015-06-21 NOTE — Progress Notes (Signed)
[facility] [facility] [place] NAME: Mackenzie Rose    MR#:  [id number]  DATE OF BIRTH:  22-Mar-1946  SUBJECTIVE:   Still quite lethargic and weak. Had modified barium swallow this morning which showed high risk for aspiration. Noted to be in mild respiratory distress this afternoon.       Review of Systems  Constitutional: Negative for fever and chills.  HENT: Negative for congestion and tinnitus.   Eyes: Negative for blurred vision and double vision.  Respiratory: Positive for cough and sputum production. Negative for shortness of breath and wheezing.   Cardiovascular: Negative for chest pain, orthopnea and PND.  Gastrointestinal: Negative for nausea, vomiting, abdominal pain and diarrhea.  Genitourinary: Negative for dysuria and hematuria.  Neurological: Positive for weakness (generalized). Negative for dizziness, sensory change and focal weakness.  All other systems reviewed and are negative.  Tolerating Diet: Evaluated by speech and high risk for aspiration and will keep nothing by mouth Tolerating PT: Evaluation noted.   DRUG ALLERGIES:   Allergies  Allergen Reactions  . Lactose Intolerance (Gi) Other (See Comments)    Reaction:  GI upset   . Lithium Other (See Comments)    Reaction:  Unknown   . Penicillins Rash    Patient tolerated Ampicillin-Sulbactam     VITALS:  Blood pressure 120/56, pulse 77, temperature 98.5 F (36.9 C), temperature source Oral, resp. rate 18, height 5\' 4"  (1.626 m), weight 58.877 kg (129 lb 12.8 oz), SpO2 90 %.  PHYSICAL EXAMINATION:   Physical Exam  GENERAL:  69 y.o.-year-old patient lying in the bed globally weak and lethargic  EYES: pupils are equally reactive to light and accommodation. No scleral icterus. HEENT: Head atraumatic, normocephalic. Oropharynx and nasopharynx clear.   NECK:  Supple, no jugular venous distention. No thyroid enlargement, no tenderness.  LUNGS: Poor respiratory  effort. Positive upper airway rhonchi, no rales, wheezes. No use of accessory muscles of respiration.  CARDIOVASCULAR: S1, S2 normal. No murmurs, rubs, or gallops.  ABDOMEN: Soft, nontender, nondistended. Bowel sounds present. No organomegaly or mass.  EXTREMITIES: No cyanosis, clubbing, +1 edema bilaterally.  Right upper extremity edema and warmth. NEUROLOGIC: Globally weak but no other focal motor or sensory deficits bilaterally.   PSYCHIATRIC: alert, oriented x 3. Good affect  SKIN: No ulcer, rash, lesions  LABORATORY PANEL:   CBC  Recent Labs Lab 06/21/15 0510  WBC 12.7*  HGB 10.1*  HCT 33.2*  PLT 267    Chemistries   Recent Labs Lab 06/20/15 0443  NA 149*  K 3.4*  CL 112*  CO2 30  GLUCOSE 113*  BUN 60*  CREATININE 1.51*  CALCIUM 8.8*    Cardiac Enzymes No results for input(s): TROPONINI in the last 168 hours.  RADIOLOGY:  No results found.   ASSESSMENT AND PLAN:  69 y.o. female who presents with shortness of breath, worsening over the last 3-4 days. Patient states that she's also had some intermittent chest pains throughout the same time. She states that she's had some episodes of acute CHF in the past, but denies being chronically followed for CHF  1. acute hypoxic respiratory failure secondary to Acute systolic CHF (congestive heart failure) and Aspiration PNA -Patient has been intubated and then extubated twice during the hospital course. Her respiratory status is tenuous given her high risk for aspiration. Clinically she does not appear to be in congestive heart failure presently. -She was diuresed with IV Lasix and will continue low-dose oral Lasix  for now. -Patient had a modified barium swallow today which was high risk for aspiration. We'll keep patient nothing by mouth for now and patient may need possible PEG tube placement.  2. Acute on Chronic RF (acute renal failure) CKD-III -Creatinine still 1.5 and close to baseline. Continue to monitor -Renal  ultrasound bilateral kidney cysts.  3. Myasthenia gravis - no acute issue. - cont. Pyridostigmine  4.HTN (hypertension)-stable  -continue coreg, hydralazine, spironolactone  5.Type 2 diabetes mellitus-blood sugar stable and continue sliding scale insulin   6.COPD (chronic obstructive pulmonary disease) - continue duo nebs.    7.h/o chronic afib -Rate controlled and continue on coreg.  -High risk for falls and therefore not on anticoagulation.  8.Bipolar affective disorder, mixed - continue Lamictal  9. Right groin skin rash appears candidial-much improved -Continue Nystatin powder   10. Right upper extremity DVT-continue heparin drip. - will switch to Oral meds once taking PO.    Seen by PT and they recommend SNF.  Seen by speech and very high risk for aspiration and likely needs a PEG tube.  Will get GI consult.   Seen by Palliative care and wants to be a Full Code still.   CODE STATUS:FULL  DVT Prophylaxis: lovenox  TOTAL TIME Spent in CARE OF THIS PATIENT: 35 minutes.   Greater than 50% of time spent in coordination of care with discussion with patient's family, care manager, speech therapist, Palliative Care.    [name] M.D on 06/21/2015 at 9:03 AM  Between 7am to 6pm - Pager - [phone]  After 6pm go to [email or web address] - password [email or web address] [facility]  Office  [phone]  CC: Primary care physician; Donato Schultz, MD

## 2015-06-21 NOTE — Progress Notes (Signed)
ANTICOAGULATION CONSULT NOTE - Follow Up Consult  Pharmacy Consult for Heparin Indication: DVT  Allergies  Allergen Reactions  . Lactose Intolerance (Gi) Other (See Comments)    Reaction:  GI upset   . Lithium Other (See Comments)    Reaction:  Unknown   . Penicillins Rash    Patient tolerated Ampicillin-Sulbactam     Patient Measurements: Height: 5\' 4"  (162.6 cm) Weight: 129 lb 12.8 oz (58.877 kg) IBW/kg (Calculated) : 54.7 Heparin Dosing Weight:   Vital Signs: Temp: 98.7 F (37.1 C) (10/13 0504) Temp Source: Oral (10/13 0504) BP: 103/67 mmHg (10/13 0504) Pulse Rate: 77 (10/13 0504)  Labs:  Recent Labs  06/19/15 0119  06/19/15 2125 06/20/15 0443 06/21/15 0510  HGB 11.0*  --   --  9.9* 10.1*  HCT 36.8  --   --  32.5* 33.2*  PLT 329  --   --  261 267  HEPARINUNFRC  --   < > 0.65 0.58 0.42  CREATININE 1.62*  --   --  1.51*  --   < > = values in this interval not displayed.  Estimated Creatinine Clearance: 29.5 mL/min (by C-G formula based on Cr of 1.51).   Medications:  Prescriptions prior to admission  Medication Sig Dispense Refill Last Dose  . acetaminophen (TYLENOL) 650 MG CR tablet Take 650 mg by mouth daily. Pt takes daily at 2pm and every four hours as needed for pain.   Taking  . albuterol (PROVENTIL HFA;VENTOLIN HFA) 108 (90 BASE) MCG/ACT inhaler Inhale 2 puffs into the lungs every 4 (four) hours as needed for wheezing or shortness of breath. 6.7 g 1 Taking  . bismuth subsalicylate (PEPTO BISMOL) 262 MG/15ML suspension Take 5 mLs by mouth 4 (four) times daily as needed for diarrhea or loose stools (or nausea).   Taking  . busPIRone (BUSPAR) 10 MG tablet Take 10 mg by mouth 2 (two) times daily.    Taking  . Dextromethorphan-Benzocaine (SORE THROAT & COUGH LOZENGES MT) Use as directed 1 lozenge in the mouth or throat 4 (four) times daily as needed (for cough).   Taking  . diclofenac sodium (VOLTAREN) 1 % GEL Apply 2 g topically 2 (two) times daily.    Taking   . diltiazem (CARDIZEM CD) 180 MG 24 hr capsule Take 180 mg by mouth daily.    Taking  . donepezil (ARICEPT) 10 MG tablet Take 1 tablet (10 mg total) by mouth daily. 30 tablet 3 Taking  . doxycycline (VIBRAMYCIN) 50 MG capsule Take 1 capsule by mouth 2 (two) times daily.     . ferrous sulfate 325 (65 FE) MG tablet Take 1 tablet (325 mg total) by mouth 2 (two) times daily with a meal. 60 tablet 3 Taking  . Fluticasone-Salmeterol (ADVAIR) 250-50 MCG/DOSE AEPB Inhale 1 puff into the lungs 2 (two) times daily.   Taking  . glimepiride (AMARYL) 1 MG tablet Take 1 tablet by mouth 2 (two) times daily.     Marland Kitchen guaifenesin (ROBITUSSIN) 100 MG/5ML syrup Take 100 mg by mouth 2 (two) times daily as needed for cough.   Taking  . hydrALAZINE (APRESOLINE) 50 MG tablet Take 1 tablet (50 mg total) by mouth 3 (three) times daily. 90 tablet 0 Taking  . lamoTRIgine (LAMICTAL) 100 MG tablet Take 100 mg by mouth 2 (two) times daily.   Taking  . latanoprost (XALATAN) 0.005 % ophthalmic solution Place 1 drop into both eyes at bedtime.    Taking  . nicotine (NICODERM CQ -  DOSED IN MG/24 HR) 7 mg/24hr patch Place 1 patch (7 mg total) onto the skin daily. 15 patch 0 Taking  . ondansetron (ZOFRAN) 4 MG tablet Take 1 tablet (4 mg total) by mouth every 8 (eight) hours as needed for nausea or vomiting. 20 tablet 0 Taking  . pantoprazole (PROTONIX) 40 MG tablet Take 1 tablet (40 mg total) by mouth 2 (two) times daily before a meal. 60 tablet 2 Taking  . phenylephrine-shark liver oil-mineral oil-petrolatum (PREPARATION H) 0.25-3-14-71.9 % rectal ointment Place 1 application rectally 2 (two) times daily as needed for hemorrhoids.   Taking  . polyethylene glycol (MIRALAX / GLYCOLAX) packet Take 17 g by mouth daily as needed for mild constipation.   Taking  . potassium chloride SA (K-DUR,KLOR-CON) 20 MEQ tablet Take 20 mEq by mouth daily.   Taking  . predniSONE (DELTASONE) 10 MG tablet Take 30 mg by mouth every other day.   Taking  .  pyridostigmine (MESTINON) 60 MG tablet Take 60 mg by mouth 3 (three) times daily.    Taking  . QUEtiapine (SEROQUEL) 200 MG tablet Take 1 tablet (200 mg total) by mouth at bedtime. 30 tablet 3 Taking  . sodium chloride (OCEAN) 0.65 % SOLN nasal spray Place 2 sprays into both nostrils daily as needed for congestion.    Taking  . traMADol (ULTRAM) 50 MG tablet Take 50 mg by mouth 2 (two) times daily as needed.   Taking  . traZODone (DESYREL) 50 MG tablet Take 1 tablet (50 mg total) by mouth at bedtime. 30 tablet 0 Taking    Assessment: Patient is a 69 yo female receiving Heparin drip for DVT in right upper extremity DVT.   HL at 0500 on 10/13: 0.42  Goal of Therapy:  Heparin level 0.3-0.7 units/ml Monitor platelets by anticoagulation protocol: Yes   Plan:  Will continue this pt on current rate of 1050 units/hr. Will recheck HL and CBC on 10/14 with AM labs.   Pharmacy will continue to follow.  Evelette Hollern G, Pharm.D.  Clinical Pharmacist 06/21/2015,8:50 AM

## 2015-06-21 NOTE — Progress Notes (Signed)
Correction:  Will plan for PEG tube placement on Monday to allow more time for treatment of DVT before stopping heparin and allow for further improvement in cardiac and pulmonary status before anesthesia.  Can have radiology place dubhoff if needed for nutrition until then.

## 2015-06-21 NOTE — Plan of Care (Signed)
Problem: Discharge Progression Outcomes Goal: Other Discharge Outcomes/Goals Plan of care progress to goal:  No complaints of pain. Modified barium swallow today - HIGH risk for aspiration. GI consulted - possible PEG placement tomorrow per GI note. D5 infusing at 50 ml/hr. Heparin gtt infusing as ordered for DVT in right arm. Patient taking pills with applesauce one at a time. Patient code status changed to DNR this shift.

## 2015-06-21 NOTE — Evaluation (Signed)
Objective Swallowing Evaluation:  (MBSS)  Patient Details  Name: Mackenzie Rose MRN: [id number] Date of Birth: [birth year]  Today's Date: 06/21/2015 Time: SLP Start Time (ACUTE ONLY): 1130-SLP Stop Time (ACUTE ONLY): 1230 SLP Time Calculation (min) (ACUTE ONLY): 60 min  Past Medical History:  Past Medical History  Diagnosis Date  . Heart disease   . Atrial fibrillation ([place])     a. reported a-fib; b. unknown chronicity; c. dates back to 1970s; d. not on long term anticoagulation 2/2 no documented a-fib  . Hypertension   . High cholesterol   . Diabetes ([place])     borderline  . COPD (chronic obstructive pulmonary disease) ([place])   . Melanoma ([place])   . Kidney disorder   . Tremor   . Autoimmune disease ([place])   . Depressed   . Anxiety disorder   . Migraine   . Dementia   . Motion sickness   . Myasthenia gravis ([place])   . Bipolar disorder ([place])   . Dementia   . Acute renal failure ([place])   . Near syncope    Past Surgical History:  Past Surgical History  Procedure Laterality Date  . Tubal ligation    . Retena repair    . Cataract extraction    . Cholecystectomy    . Esophagogastroduodenoscopy (egd) with propofol N/A 05/22/2015    Procedure: ESOPHAGOGASTRODUODENOSCOPY (EGD) WITH PROPOFOL;  Surgeon: Lucilla Lame, MD;  Location: [facility] ENDOSCOPY;  Service: Endoscopy;  Laterality: N/A;   HPI:  Other Pertinent Information: pt has now been extubated a 2nd time and is tolerating thus far. Often she states she "cannot breath" but has not required further O2 intervention. She is on [place] O2 support currently. MD has indicated pt has Myasthenia Gravis; this may be impacting her swallowing along w/ her extended illness and multiple oral intubations. Pt does have a dx of moderate hiatal hernia.   No Data Recorded  Assessment / Plan / Recommendation CHL IP CLINICAL IMPRESSIONS 06/21/2015  Therapy Diagnosis Severe pharyngeal phase dysphagia  Clinical Impression Pt presented w/ severe pharyngeal  phase dysphagia c/b a severely delayed pharyngeal swallow initiation w/ all consistencies assessed(Nectar and Honey consistency liquids, purees). This resulted in laryngeal penetration of Nectar and Honey consistency liquids despite strict aspiration precautions. Pt responded to the aspiration w/ a delayed, congested cough - somewhat efficient but did not appear fully protective; noted SILENT larygneal penetration however. Of note, pt returned to her room post eval w/ a continued wet, congested cough noted. Pt was fed using TSP boluses to monitor and limit bolus size w/ all consistencies. Thickened liquid consistencies spilled from the oral cavity to the pyriform sinuses w/ immediate bolus material moving into the laryngeal vestibule BEFORE the pharyngeal swallow initiated. As pt swallowed, she was able to clear some of the material out of the vestibule while some was aspirated. Also noted the oral and BOT residue remaining after the initial swallow spilled into the pharynx and was penetrated as well. Of note, the epiglottis appeared to rest against the lower BOT of tongue and the valleculae was not patent to collect the bolus residue. A pureed consistency was more dense and was able to open the epiglottis which aided in proection of the airway during the swallow(no penetration or aspriation noted of the applesauce trials - 3). Oral phase c/b decreased lingual strength/coordination for bolus cohesion and clearing; residue remained w/ all trials. Pt is at high risk for aspiration during and post the swallowing of po boluses and at  increased risk for being unable to meet her nutrition/hydration needs safely and adequately.       CHL IP TREATMENT RECOMMENDATION 06/21/2015  Treatment Recommendations (No Data)     CHL IP DIET RECOMMENDATION 06/21/2015  SLP Diet Recommendations NPO  Liquid Administration via (None)  Medication Administration (None)  Compensations (None)  Postural Changes and/or Swallow  Maneuvers (None)     CHL IP OTHER RECOMMENDATIONS 06/21/2015  Recommended Consults Consider GI evaluation  Oral Care Recommendations Oral care QID;Staff/trained caregiver to provide oral care  Other Recommendations (None)     No flowsheet data found.   CHL IP FREQUENCY AND DURATION 06/21/2015  Speech Therapy Frequency (ACUTE ONLY) (No Data)  Treatment Duration (No Data)     Pertinent Vitals/Pain Grimacing; repositioned.    SLP Swallow Goals TBD      CHL IP REASON FOR REFERRAL 06/21/2015  Reason for Referral Objectively evaluate swallowing function                       Mackenzie Kenner, MS, CCC-SLP  Mackenzie Rose 06/21/2015, 3:32 PM

## 2015-06-21 NOTE — Consult Note (Signed)
GI Inpatient Consult Note  Reason for Consult: dysphagia, high aspiration risk   Attending Requesting Consult: Dr Verdell Carmine  History of Present Illness: Mackenzie Rose is a 69 y.o. female  With a complicated past medical history including COPD, dementia, m gravis,  Who has had a complicated hospitalization including acute respiratory failure thought secondary to CHF and aspiration pneumonia new GI is consulted on for possible placement of a PEG tube.   The patient had a speech and swallow study today.  She had some aspiration during that study and was deemed very high aspiration risk with p.o. Intake.     Currently is done is quite sleepy and does not have any specific complaints.  She indicates that she would be willing to undergo feeding tube placement but would like the decision to be run by her son.   I have not yet spoken to her son upper the patient Care notes he would be in agreement with attempted a PEG tube.    Of note during this hospitalization, she was diagnosed with an upper extremity DVT and is currently on a heparin infusion.    She does have evidence of a hiatal hernia on both her previous CT and her upper endoscopy from September.   However it appears mostly gastric body is below the diaphragm and therefore  Transillumination may still be possible  In order to place a feeding tube through the abdominal wall into the gastric body.  Past Medical History:  Past Medical History  Diagnosis Date  . Heart disease   . Atrial fibrillation ([place])     a. reported a-fib; b. unknown chronicity; c. dates back to 1970s; d. not on long term anticoagulation 2/2 no documented a-fib  . Hypertension   . High cholesterol   . Diabetes ([place])     borderline  . COPD (chronic obstructive pulmonary disease) ([place])   . Melanoma ([place])   . Kidney disorder   . Tremor   . Autoimmune disease ([place])   . Depressed   . Anxiety disorder   . Migraine   . Dementia   . Motion sickness   . Myasthenia gravis  ([place])   . Bipolar disorder ([place])   . Dementia   . Acute renal failure ([place])   . Near syncope     Problem List: Patient Active Problem List   Diagnosis Date Noted  . Pressure ulcer 06/16/2015  . Encounter for intubation   . SOB (shortness of breath)   . Dyspnea   . Acute systolic CHF (congestive heart failure) 06/09/2015  . Blood in stool   . Anemia 05/21/2015  . Acute GI bleeding   . Symptomatic anemia   . Syncope 05/17/2015  . Near syncope 03/25/2015  . ARF (acute renal failure) ([place]) 03/25/2015  . COPD (chronic obstructive pulmonary disease) ([place]) 03/25/2015  . Anxiety 12/12/2014  . Bipolar affective disorder, mixed ([place]) 12/12/2014  . Renal insufficiency syndrome 12/12/2014  . HTN (hypertension) 12/12/2014  . Type 2 diabetes mellitus ([place]) 12/12/2014  . 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 Bipolar disorder ([place])   . Dementia   . Ulnar neuropathy at elbow of right upper extremity 09/22/2014  . Myasthenia gravis ([place]) 11/22/2013    Past Surgical History: Past Surgical History  Procedure Laterality Date  . Tubal ligation    . Retena repair    . Cataract extraction    . Cholecystectomy    . Esophagogastroduodenoscopy (egd) with propofol N/A 05/22/2015    Procedure: ESOPHAGOGASTRODUODENOSCOPY (  EGD) WITH PROPOFOL;  Surgeon: Lucilla Lame, MD;  Location[facility] ENDOSCOPY;  Service: Endoscopy;  Laterality: N/A;    Allergies: Allergies  Allergen Reactions  . Lactose Intolerance (Gi) Other (See Comments)    Reaction:  GI upset   . Lithium Other (See Comments)    Reaction:  Unknown   . Penicillins Rash    Patient tolerated Ampicillin-Sulbactam     Home Medications: Prescriptions prior to admission  Medication Sig Dispense Refill Last Dose  . acetaminophen (TYLENOL) 650 MG CR tablet Take 650 mg by mouth daily. Pt takes daily at 2pm and every four hours as needed for pain.   Taking  . albuterol (PROVENTIL HFA;VENTOLIN HFA) 108 (90 BASE) MCG/ACT inhaler  Inhale 2 puffs into the lungs every 4 (four) hours as needed for wheezing or shortness of breath. 6.7 g 1 Taking  . bismuth subsalicylate (PEPTO BISMOL) 262 MG/15ML suspension Take 5 mLs by mouth 4 (four) times daily as needed for diarrhea or loose stools (or nausea).   Taking  . busPIRone (BUSPAR) 10 MG tablet Take 10 mg by mouth 2 (two) times daily.    Taking  . Dextromethorphan-Benzocaine (SORE THROAT & COUGH LOZENGES MT) Use as directed 1 lozenge in the mouth or throat 4 (four) times daily as needed (for cough).   Taking  . diclofenac sodium (VOLTAREN) 1 % GEL Apply 2 g topically 2 (two) times daily.    Taking  . diltiazem (CARDIZEM CD) 180 MG 24 hr capsule Take 180 mg by mouth daily.    Taking  . donepezil (ARICEPT) 10 MG tablet Take 1 tablet (10 mg total) by mouth daily. 30 tablet 3 Taking  . doxycycline (VIBRAMYCIN) 50 MG capsule Take 1 capsule by mouth 2 (two) times daily.     . ferrous sulfate 325 (65 FE) MG tablet Take 1 tablet (325 mg total) by mouth 2 (two) times daily with a meal. 60 tablet 3 Taking  . Fluticasone-Salmeterol (ADVAIR) 250-50 MCG/DOSE AEPB Inhale 1 puff into the lungs 2 (two) times daily.   Taking  . glimepiride (AMARYL) 1 MG tablet Take 1 tablet by mouth 2 (two) times daily.     Marland Kitchen guaifenesin (ROBITUSSIN) 100 MG/5ML syrup Take 100 mg by mouth 2 (two) times daily as needed for cough.   Taking  . hydrALAZINE (APRESOLINE) 50 MG tablet Take 1 tablet (50 mg total) by mouth 3 (three) times daily. 90 tablet 0 Taking  . lamoTRIgine (LAMICTAL) 100 MG tablet Take 100 mg by mouth 2 (two) times daily.   Taking  . latanoprost (XALATAN) 0.005 % ophthalmic solution Place 1 drop into both eyes at bedtime.    Taking  . nicotine (NICODERM CQ - DOSED IN MG/24 HR) 7 mg/24hr patch Place 1 patch (7 mg total) onto the skin daily. 15 patch 0 Taking  . ondansetron (ZOFRAN) 4 MG tablet Take 1 tablet (4 mg total) by mouth every 8 (eight) hours as needed for nausea or vomiting. 20 tablet 0 Taking   . pantoprazole (PROTONIX) 40 MG tablet Take 1 tablet (40 mg total) by mouth 2 (two) times daily before a meal. 60 tablet 2 Taking  . phenylephrine-shark liver oil-mineral oil-petrolatum (PREPARATION H) 0.25-3-14-71.9 % rectal ointment Place 1 application rectally 2 (two) times daily as needed for hemorrhoids.   Taking  . polyethylene glycol (MIRALAX / GLYCOLAX) packet Take 17 g by mouth daily as needed for mild constipation.   Taking  . potassium chloride SA (K-DUR,KLOR-CON) 20 MEQ tablet Take 20  mEq by mouth daily.   Taking  . predniSONE (DELTASONE) 10 MG tablet Take 30 mg by mouth every other day.   Taking  . pyridostigmine (MESTINON) 60 MG tablet Take 60 mg by mouth 3 (three) times daily.    Taking  . QUEtiapine (SEROQUEL) 200 MG tablet Take 1 tablet (200 mg total) by mouth at bedtime. 30 tablet 3 Taking  . sodium chloride (OCEAN) 0.65 % SOLN nasal spray Place 2 sprays into both nostrils daily as needed for congestion.    Taking  . traMADol (ULTRAM) 50 MG tablet Take 50 mg by mouth 2 (two) times daily as needed.   Taking  . traZODone (DESYREL) 50 MG tablet Take 1 tablet (50 mg total) by mouth at bedtime. 30 tablet 0 Taking   Home medication reconciliation was not completed with the patient.   Scheduled Inpatient Medications:   . antiseptic oral rinse  7 mL Mouth Rinse QID  . carvedilol  3.125 mg Per Tube BID WC  . chlorhexidine gluconate  15 mL Mouth Rinse BID  . insulin aspart  0-9 Units Subcutaneous 6 times per day  . lamoTRIgine  100 mg Oral BID  . latanoprost  1 drop Both Eyes QHS  . nitroGLYCERIN  1 inch Topical 3 times per day  . nystatin   Topical TID  . pantoprazole (PROTONIX) IV  40 mg Intravenous Q24H  . pyridostigmine  10 mg Intravenous TID  . spironolactone  25 mg Oral Daily    Continuous Inpatient Infusions:   . dextrose 50 mL/hr at 06/21/15 0705  . heparin 1,050 Units/hr (06/20/15 2228)    PRN Inpatient Medications:  albuterol, bisacodyl, ipratropium-albuterol,  LORazepam, metoprolol, [DISCONTINUED] ondansetron **OR** ondansetron (ZOFRAN) IV  Family History: family history includes Breast cancer in her sister; Diabetes Mellitus II in her sister; Heart disease in her father and sister; Other in her mother.    Social History:   reports that she has been smoking Cigarettes.  She started smoking about 50 years ago. She has a 100 pack-year smoking history. She has never used smokeless tobacco. She reports that she does not drink alcohol or use illicit drugs.   Review of Systems: Unable to obtain.    Physical Examination: BP 120/56 mmHg  Pulse 77  Temp(Src) 98.5 F (36.9 C) (Oral)  Resp 18  Ht 5\' 4"  (1.626 m)  Wt 58.877 kg (129 lb 12.8 oz)  BMI 22.27 kg/m2  SpO2 90% Gen: NAD, alert and oriented x 2, somnolent HEENT: PEERLA, EOMI, Neck: supple, no JVD or thyromegaly Chest: CTA bilaterally, no wheezes, crackles, or other adventitious sounds CV: RRR, no m/g/c/r Abd: +mild diffuse TTP ND, +BS in all four quadrants; no HSM, guarding, ridigity, or rebound tenderness Ext: +mild edema bilat, well perfused with 2+ pulses, Skin: no rash or lesions noted Lymph: no LAD  Data: Lab Results  Component Value Date   WBC 12.7* 06/21/2015   HGB 10.1* 06/21/2015   HCT 33.2* 06/21/2015   MCV 83.1 06/21/2015   PLT 267 06/21/2015    Recent Labs Lab 06/19/15 0119 06/20/15 0443 06/21/15 0510  HGB 11.0* 9.9* 10.1*   Lab Results  Component Value Date   NA 149* 06/20/2015   K 3.4* 06/20/2015   CL 112* 06/20/2015   CO2 30 06/20/2015   BUN 60* 06/20/2015   CREATININE 1.51* 06/20/2015   Lab Results  Component Value Date   ALT 18 06/08/2015   AST 26 06/08/2015   ALKPHOS 114 06/08/2015   BILITOT  0.7 06/08/2015    Recent Labs Lab 06/16/15 1710  APTT 32  INR 1.08   Assessment/Plan: Ms. Tarrant is a 69 y.o. female with complicated past medical history and complicated hospitalization who is high risk for aspiration and would not be able to safely  take in p.o. Intake by mouth.   She is high risk for anesthesia given her many comorbidities including COPD, CHF, myasthenia gravis  But placement of the feeding tube is necessary in this situation and outweighs the risks.   She also has a hiatal hernia the based on the CT and a previous endoscopy there may be enough gastric body below the diaphragm to obtain good transillumination.  Recommendations:  - possible PEG placement tomorrow.  - hold heparin drip starting at 6 am.  - npo mn - will speak to son regarding consent   Thank you for the consult. Please call with questions or concerns.  REIN, Grace Blight, MD

## 2015-06-22 ENCOUNTER — Inpatient Hospital Stay: Payer: Medicare Other

## 2015-06-22 LAB — BASIC METABOLIC PANEL
Anion gap: 5 (ref 5–15)
BUN: 32 mg/dL — AB (ref 6–20)
CALCIUM: 8.6 mg/dL — AB (ref 8.9–10.3)
CO2: 29 mmol/L (ref 22–32)
Chloride: 111 mmol/L (ref 101–111)
Creatinine, Ser: 1.28 mg/dL — ABNORMAL HIGH (ref 0.44–1.00)
GFR calc Af Amer: 48 mL/min — ABNORMAL LOW (ref >60)
GFR, EST NON AFRICAN AMERICAN: 41 mL/min — AB (ref >60)
GLUCOSE: 93 mg/dL (ref 65–99)
POTASSIUM: 3.9 mmol/L (ref 3.5–5.1)
Sodium: 145 mmol/L (ref 135–145)

## 2015-06-22 LAB — GLUCOSE, CAPILLARY
GLUCOSE-CAPILLARY: 105 mg/dL — AB (ref 65–99)
GLUCOSE-CAPILLARY: 94 mg/dL (ref 65–99)
Glucose-Capillary: 76 mg/dL (ref 65–99)
Glucose-Capillary: 91 mg/dL (ref 65–99)
Glucose-Capillary: 93 mg/dL (ref 65–99)
Glucose-Capillary: 95 mg/dL (ref 65–99)
Glucose-Capillary: 98 mg/dL (ref 65–99)

## 2015-06-22 LAB — CBC
HCT: 31 % — ABNORMAL LOW (ref 35.0–47.0)
Hemoglobin: 9.6 g/dL — ABNORMAL LOW (ref 12.0–16.0)
MCH: 25.9 pg — AB (ref 26.0–34.0)
MCHC: 31.1 g/dL — ABNORMAL LOW (ref 32.0–36.0)
MCV: 83.4 fL (ref 80.0–100.0)
PLATELETS: 225 10*3/uL (ref 150–440)
RBC: 3.72 MIL/uL — ABNORMAL LOW (ref 3.80–5.20)
RDW: 25.9 % — AB (ref 11.5–14.5)
WBC: 10.5 10*3/uL (ref 3.6–11.0)

## 2015-06-22 LAB — HEPARIN LEVEL (UNFRACTIONATED): Heparin Unfractionated: 0.52 IU/mL (ref 0.30–0.70)

## 2015-06-22 MED ORDER — IMMUNE GLOBULIN (HUMAN) 5 GM/50ML IV SOLN
400.0000 mg/kg | INTRAVENOUS | Status: AC
  Administered 2015-06-22 – 2015-06-24 (×3): 25 g via INTRAVENOUS
  Filled 2015-06-22 (×3): qty 50

## 2015-06-22 NOTE — Care Management Important Message (Signed)
Important Message  Patient Details  Name: Mackenzie Rose MRN: [id number] Date of Birth: 28-Nov-1945   Medicare Important Message Given:  Yes-third notification given    Darius Bump Allmond 06/22/2015, 2:23 PM

## 2015-06-22 NOTE — Progress Notes (Signed)
Nutrition Follow-up   INTERVENTION:   Coordination of Care: Spoke with MD, Verdell Carmine this afternoon regarding nutrition poc as pt has been NPO since extubation s/p SLP evaluation. Per GI MD note, PEG scheduled for Monday as pt has been on heparin drip. MD agreeable to placing dobhoff tube under fluoro as Nsg has attempted in ICU to place at bedside but has been unsuccessful. MD Verdell Carmine since has made RD aware that pt adamantly refused placement of dobhoff in radiology but is still willing to have PEG placed Monday. Pt currently remains NPO. Will continue to follow and make recommendations accordingly. Once PEG able to be placed, will make recommendations for TF.   NUTRITION DIAGNOSIS:   Inadequate oral intake related to acute illness as evidenced by NPO status  GOAL:   Patient will meet greater than or equal to 90% of their needs; ongoing  MONITOR:    (Energy intake, Pulmonary profile, Digestive system)  REASON FOR ASSESSMENT:   Consult Enteral/tube feeding initiation and management  ASSESSMENT:   Pt s/p extubation 10/10. Neurology following. RD noted NIFF refused this am to assess pulmonary profile. PEG scheduled per GI note on Monday.  Diet Order:  Diet NPO time specified Except for: Ice Chips, Other (See Comments)    Current Nutrition: Pt remains NPO day 5   Gastrointestinal Profile: Last BM: 06/21/2015   Medications: SS novolog, Protonix, D5 at 30mL/hr, heparin  Electrolyte/Renal Profile and Glucose Profile:   Recent Labs Lab 06/18/15 0339 06/19/15 0119 06/20/15 0443 06/22/15 0450  NA 147*  149* 152* 149* 145  K 4.3  4.4 3.9 3.4* 3.9  CL 106  105 111 112* 111  CO2 33*  33* 32 30 29  BUN 80*  87* 82* 60* 32*  CREATININE 1.77*  1.73* 1.62* 1.51* 1.28*  CALCIUM 8.8*  9.0 9.3 8.8* 8.6*  PHOS 3.4  --   --   --   GLUCOSE 275*  275* 137* 113* 93   Protein Profile:  Recent Labs Lab 06/18/15 0339  ALBUMIN 2.2*     Weight Trend since  Admission: Filed Weights   06/20/15 0559 06/21/15 0504 06/22/15 0500  Weight: 133 lb 2.5 oz (60.4 kg) 129 lb 12.8 oz (58.877 kg) 137 lb 8 oz (62.37 kg)     Skin:   (stage II sacrum)    BMI:  Body mass index is 23.59 kg/(m^2).  Estimated Nutritional Needs:   Kcal:  1550-1831 kcals (BEE 1084, 1.3 AF, 1.1-1.3 IF)   Protein:  (1.2-2.0 g/kg) 78-130 g/d  Fluid:  25-46ml/kg) 1625-1929ml/d  EDUCATION NEEDS:   No education needs identified at this time   [place], RD, LDN Pager [phone]

## 2015-06-22 NOTE — Care Management (Addendum)
Chart screen: Presented to the emergency room 05/16/15 after a fall at [facility]. Was followed by [name].  Discharged from [facility] to [facility] 05/20/15. Returned to the emergency room the same day.  Upper GI 05/22/15. Discharged back to [facility] 05/23/15.  Readmitted with respiratory distress and placed on BIPap. Placed on ventilator 06/11/15, LTAC was discussed on rounds 06/12/15 and then again on 06/14/15. Placed on hold at that time due to the fact that Ms. Greenbaum was re-intubated. Extubated 10/16.  Transferred to 1C on 06/20/15.  Telephone call to Dr. Tor Netters. Agrees that Ms. Ruesch can be assessed for LTAC at this time. Corren from [facility] updated. Shelbie Ammons RN MSN Care Management [phone]

## 2015-06-22 NOTE — Progress Notes (Deleted)
Late entry 06/22/15 for 06/21/15  CSW followed up to assist with dc. Per RN, lethargic and not active this morning. CSW contacted Tiffany, Therapist, sports at [facility] and she is coming to assess. CSW advised that Pt was likely not going to dc today after all.  CSW followed back up with RN who reported that family has been visiting but CSW has not gotten a return call from [place].    CSW will continue to follow until dc. Fl2 on the chart.   Toma Copier, [place]

## 2015-06-22 NOTE — Progress Notes (Signed)
Due to health status, have cancelled her initial appointment at the [facility]. Will follow along with you and should her health improve, can reschedule it at that time. Thank you.

## 2015-06-22 NOTE — Progress Notes (Signed)
Palliative Medicine Inpatient Consult Follow Up Note   Name: Mackenzie Rose Date: 06/22/2015 MRN: [id number]  DOB: 12-Apr-1946  Referring Physician: [name], MD  Palliative Care consult requested for this 69 y.o. female for goals of medical therapy in patient with acute on chronic respiratory failure, with a 100 pack /yr h/o tobacco smoking, aspiration pneumonia, severe dysphagia at very high risk of aspiration, large hiatal hernia with intrathoracic stomach, and dementia.   _________________________________________________________________________________  TODAY'S DISCUSSIONS AND DECISIONS:  1.  SHE REMAINS DNR (since my last discussion with son) ---portable DNR form in paper chart  2.  She cannot make her own healthcare decisions. ---always ask son (call Terressa Koyanagi at [phone])  3.  She is unable to swallow safely and needs a PEG ---she has a partly intrathoracic stomach but GI feels there is enough stomach below the diaphragm that a PEG could be placed. ---she will need nutrition between now and Monday when PEG placement is scheduled (delayed till Monday due to DVT in arm and anticoagulation issues)  4.  She has advanced COPD and this is the primary cause of her two intubations while here.  5.  Neuro has seen pt and did not feel she was having a Myasthenia Gravis flare causing the intubations.  Attending does not feel her swallowing problem is due to MG.  SLP has noted decreased tone in tongue and epiglottis.  Pt on IVIG times 3 days now (see neuro note).   6.  She is being evaluated for possible LTAC transfer.     I will sign off as I think my role is limited going forward.  Please call again if she is not transferred and if Palliative discussions or decisions need to be pursued again.                                                                     Kirby Funk, MD                                                                   Pager [phone]                                                                  Molli Knock -Fri 8 am until 6pm ____________________________________________________________________________________   IMPRESSION: Acute hypoxic respiratory failure --recurrent and due to CHF and COPD and poor airway clearance --likely due to myasthenia gravis COPD Current tobacco smoker (2ppd for 50 yrs) Acute Systolic CHF  ---EF 50-93% with mod MR and Grade 1 Diastolic Dysfxn and WMA's seen.  Prediabetic Dyslipidemia History of melanoma Myasthenia Gravis Alzheimer's Dementia Migraine Headaches Bipolar Disorder Acute on Chronic Renal Failure with CKD stage III with proteinuria  --due to diabetic nephropathy and renal artery stenosis Falls AFib with diagnosis made in  1970's but not on long term anticoagulation due to no recent clear documentation of Afib.  Essential HTN PUD diagnosed with upper endo Sept13, 2016 (Dr Allen Norris) ---treated with protonix bid Dysphagia ---due to poor mental status on 10/5 --given Dysphagia 1 with Honey thick liquids ---SLP to evaluate again on 10/13. DM2  Dependent edema] Candidal skin rach of right thigh and groin area. 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    CODE STATUS: DNR   PAST MEDICAL HISTORY: Past Medical History  Diagnosis Date  . Heart disease   . Atrial fibrillation ([place])     a. reported a-fib; b. unknown chronicity; c. dates back to 1970s; d. not on long term anticoagulation  2/2 no documented a-fib  . Hypertension   . High cholesterol   . Diabetes ([place])     borderline  . COPD (chronic obstructive pulmonary disease) ([place])   . Melanoma ([place])   . Kidney disorder   . Tremor   . Autoimmune disease ([place])   . Depressed   . Anxiety disorder   . Migraine   . Dementia   . Motion sickness   . Myasthenia gravis ([place])   . Bipolar disorder ([place])   . Dementia   . Acute renal failure ([place])   . Near syncope     PAST SURGICAL HISTORY:  Past Surgical History  Procedure Laterality Date  . Tubal ligation    . Retena repair    . Cataract extraction    . Cholecystectomy    . Esophagogastroduodenoscopy (egd) with propofol N/A 05/22/2015    Procedure: ESOPHAGOGASTRODUODENOSCOPY (EGD) WITH PROPOFOL;  Surgeon: Lucilla Lame, MD;  Location: [facility] ENDOSCOPY;  Service: Endoscopy;  Laterality: N/A;    Vital Signs: BP 140/72 mmHg  Pulse 60  Temp(Src) 97.9 F (36.6 C) (Oral)  Resp 20  Ht 5\' 4"  (1.626 m)  Wt 62.37 kg (137 lb 8 oz)  BMI 23.59 kg/m2  SpO2 95% Filed Weights   06/20/15 0559 06/21/15 0504 06/22/15 0500  Weight: 60.4 kg (133 lb 2.5 oz) 58.877 kg (129 lb 12.8 oz) 62.37 kg (137 lb 8 oz)    Estimated body mass index is 23.59 kg/(m^2) as calculated from the following:   Height as of this encounter: 5\' 4"  (1.626 m).   Weight as of this encounter: 62.37 kg (137 lb 8 oz).  PHYSICAL EXAM: Awake but conversation is illogical EOMI with heavy eyelids (mild ptosis) bilaterally OP clear No JVD or Tm Hrt rrr no mgr Lungs decreased BS in bases Abd soft and NT Ext no cyanosis or mottling   LABS: CBC:    Component Value Date/Time   WBC 10.5 06/22/2015 0450   WBC 10.9 12/17/2014 0422   HGB 9.6* 06/22/2015 0450   HGB 9.1* 12/17/2014 0422   HCT 31.0* 06/22/2015 0450   HCT 30.0* 12/17/2014 0422   PLT 225 06/22/2015 0450   PLT 192 12/17/2014 0422   MCV 83.4 06/22/2015 0450   MCV 78* 12/17/2014 0422   NEUTROABS 10.7* 04/25/2015 2104   NEUTROABS 9.9* 12/17/2014  0422   LYMPHSABS 0.8* 04/25/2015 2104   LYMPHSABS 0.4* 12/17/2014 0422   MONOABS 0.6 04/25/2015  2104   MONOABS 0.6 12/17/2014 0422   EOSABS 0.0 04/25/2015 2104   EOSABS 0.0 12/17/2014 0422   BASOSABS 0.0 04/25/2015 2104   BASOSABS 0.0 12/17/2014 0422   Comprehensive Metabolic Panel:    Component Value Date/Time   NA 145 06/22/2015 0450   NA 138 12/17/2014 0422   K 3.9 06/22/2015 0450   K 4.6 12/17/2014 0422   CL 111 06/22/2015 0450   CL 106 12/17/2014 0422   CO2 29 06/22/2015 0450   CO2 28 12/17/2014 0422   BUN 32* 06/22/2015 0450   BUN 46* 12/17/2014 0422   CREATININE 1.28* 06/22/2015 0450   CREATININE 1.29* 12/17/2014 0422   GLUCOSE 93 06/22/2015 0450   GLUCOSE 88 12/17/2014 0422   CALCIUM 8.6* 06/22/2015 0450   CALCIUM 8.6* 12/17/2014 0422   AST 26 06/08/2015 2310   AST 21 12/12/2014 2044   ALT 18 06/08/2015 2310   ALT 13* 12/12/2014 2044   ALKPHOS 114 06/08/2015 2310   ALKPHOS 76 12/12/2014 2044   BILITOT 0.7 06/08/2015 2310   BILITOT 0.1* 12/12/2014 2044   PROT 6.1* 06/08/2015 2310   PROT 5.9* 12/12/2014 2044   ALBUMIN 2.2* 06/18/2015 0339   ALBUMIN 3.0* 12/12/2014 2044     More than 50% of the visit was spent in counseling/coordination of care: YES  Time Spent:  35 min

## 2015-06-22 NOTE — Progress Notes (Signed)
[place] at [facility] [place] NAME: Mackenzie Rose    MR#:  [id number]  DATE OF BIRTH:  08/08/1946  SUBJECTIVE:   Remains lethargic and weak.  Seen by GI and plan for doing PEG tube on Monday.  Also seen by Neuro and started on IVIG for Myasthenia.  No shortness of breath.  Pt. Says she is "hungry"     Review of Systems  Constitutional: Negative for fever and chills.  HENT: Negative for congestion and tinnitus.   Eyes: Negative for blurred vision and double vision.  Respiratory: Positive for cough and sputum production. Negative for shortness of breath and wheezing.   Cardiovascular: Negative for chest pain, orthopnea and PND.  Gastrointestinal: Negative for nausea, vomiting, abdominal pain and diarrhea.  Genitourinary: Negative for dysuria and hematuria.  Neurological: Positive for weakness (generalized). Negative for dizziness, sensory change and focal weakness.  All other systems reviewed and are negative.  Tolerating Diet: Evaluated by speech and high risk for aspiration and will keep nothing by mouth Tolerating PT: Evaluation noted.   DRUG ALLERGIES:   Allergies  Allergen Reactions  . Lactose Intolerance (Gi) Other (See Comments)    Reaction:  GI upset   . Lithium Other (See Comments)    Reaction:  Unknown   . Penicillins Rash    Patient tolerated Ampicillin-Sulbactam     VITALS:  Blood pressure 140/72, pulse 60, temperature 97.9 F (36.6 C), temperature source Oral, resp. rate 20, height 5\' 4"  (1.626 m), weight 62.37 kg (137 lb 8 oz), SpO2 95 %.  PHYSICAL EXAMINATION:   Physical Exam  GENERAL:  69 y.o.-year-old patient lying in the bed globally weak and lethargic  EYES: pupils are equally reactive to light and accommodation. No scleral icterus. HEENT: Head atraumatic, normocephalic. Oropharynx and nasopharynx clear.   NECK:  Supple, no jugular venous distention. No thyroid enlargement, no tenderness.  LUNGS: Poor  respiratory effort. Positive upper airway rhonchi, no rales, wheezes. No use of accessory muscles of respiration.  CARDIOVASCULAR: S1, S2 normal. No murmurs, rubs, or gallops.  ABDOMEN: Soft, nontender, nondistended. Bowel sounds present. No organomegaly or mass.  EXTREMITIES: No cyanosis, clubbing, +1 edema bilaterally.  Right upper ext. Edema and wamth much improved.  NEUROLOGIC: Globally weak but no other focal motor or sensory deficits bilaterally.   PSYCHIATRIC: alert, oriented x 3. Good affect  SKIN: No ulcer, rash, lesions  LABORATORY PANEL:   CBC  Recent Labs Lab 06/22/15 0450  WBC 10.5  HGB 9.6*  HCT 31.0*  PLT 225    Chemistries   Recent Labs Lab 06/22/15 0450  NA 145  K 3.9  CL 111  CO2 29  GLUCOSE 93  BUN 32*  CREATININE 1.28*  CALCIUM 8.6*    Cardiac Enzymes No results for input(s): TROPONINI in the last 168 hours.  RADIOLOGY:  No results found.   ASSESSMENT AND PLAN:  69 y.o. female who presents with shortness of breath, worsening over the last 3-4 days. Patient states that she's also had some intermittent chest pain throughout the same time. She states that she's had some episodes of acute CHF in the past, but denies being chronically followed for CHF  1. acute hypoxic respiratory failure secondary to Acute systolic CHF (congestive heart failure) and Aspiration PNA -Patient has been intubated and then extubated twice during the hospital course. Her respiratory status is tenuous given her high risk for aspiration. Clinically she does not appear to be in congestive heart failure  presently. -She was diuresed with IV Lasix and responded well -Patient had a modified barium swallow  which was high risk for aspiration. Seen by GI and plan to place PEG tube on Monday.    2. Acute on Chronic RF (acute renal failure) CKD-III -Creatinine still 1.2 and close to baseline. Continue to monitor -Renal ultrasound bilateral kidney cysts.  3. Myasthenia gravis -  appreciate Neuro input and they do not think her weakness is related to Myasthenia crisis but will empirically start on IVIG X 3 days and monitor.  - cont. Pyridostigmine  4.HTN (hypertension)-stable  -continue coreg, hydralazine, spironolactone  5.Type 2 diabetes mellitus-blood sugar stable and continue sliding scale insulin   6.COPD (chronic obstructive pulmonary disease) - continue duo nebs.    7.h/o chronic afib -Rate controlled and continue on coreg.  -High risk for falls and therefore not on anticoagulation.  8.Bipolar affective disorder, mixed - continue Lamictal  9. Right groin skin rash appears candidial-much improved -Continue Nystatin powder   10. Right upper extremity DVT-continue heparin drip. - will switch to Oral meds once PEG tube placed.      Appreciate Palliative care help and pt. Is a DNR now.  Discussed w/ Case Manager and pt. Could be a candidate for LTAC placement.    CODE STATUS:FULL  DVT Prophylaxis: Heparin gtt.   TOTAL TIME Spent in CARE OF THIS PATIENT: 30 minutes.    [name] M.D on 06/22/2015 at 9:03 AM  Between 7am to 6pm - Pager - [phone]  After 6pm go to [email or web address] - password [email or web address]  Office  [phone]  CC: Primary care physician; Donato Schultz, MD

## 2015-06-22 NOTE — Plan of Care (Signed)
Problem: Discharge Progression Outcomes Goal: Other Discharge Outcomes/Goals Outcome: Progressing Plan of care progress to goal: Heparin gtt infusing. IV Dextrose infusing. Pt NPO with ice chips. VSS.  Updated sister, Perrin Smack later in shift. Sister to visit later on tonight. Turning q2h.

## 2015-06-22 NOTE — Progress Notes (Addendum)
Pt very upset about not being able to eat. Refused dobhoff this afternoon. MD aware.  Frequently educated patient on reasons she cannot tolerate PO intake. Pt is saying she can tolerate foods.  Discussed with speech therapist, reviewed swallow study from 10/13. Per speech therapist, pt cannot tolerate anything other than small bites of applesauce. Pt refusing applesauce and "wants a bagel." Pt wants an attorney because she wants to eat. MD aware. RN called patients sister, Perrin Smack, to update her, no answer. Unable to leave a message.

## 2015-06-22 NOTE — Progress Notes (Signed)
ANTICOAGULATION CONSULT NOTE - Follow Up Consult  Pharmacy Consult for Heparin Indication: DVT  Allergies  Allergen Reactions  . Lactose Intolerance (Gi) Other (See Comments)    Reaction:  GI upset   . Lithium Other (See Comments)    Reaction:  Unknown   . Penicillins Rash    Patient tolerated Ampicillin-Sulbactam     Patient Measurements: Height: 5\' 4"  (162.6 cm) Weight: 137 lb 8 oz (62.37 kg) IBW/kg (Calculated) : 54.7 Heparin Dosing Weight:   Vital Signs: Temp: 97.9 F (36.6 C) (10/14 0502) Temp Source: Oral (10/14 0502) BP: 137/70 mmHg (10/14 0502) Pulse Rate: 58 (10/14 0502)  Labs:  Recent Labs  06/20/15 0443 06/21/15 0510 06/22/15 0450  HGB 9.9* 10.1* 9.6*  HCT 32.5* 33.2* 31.0*  PLT 261 267 225  HEPARINUNFRC 0.58 0.42 0.52  CREATININE 1.51*  --  1.28*    Estimated Creatinine Clearance: 34.8 mL/min (by C-G formula based on Cr of 1.28).   Medications:  Prescriptions prior to admission  Medication Sig Dispense Refill Last Dose  . acetaminophen (TYLENOL) 650 MG CR tablet Take 650 mg by mouth daily. Pt takes daily at 2pm and every four hours as needed for pain.   Taking  . albuterol (PROVENTIL HFA;VENTOLIN HFA) 108 (90 BASE) MCG/ACT inhaler Inhale 2 puffs into the lungs every 4 (four) hours as needed for wheezing or shortness of breath. 6.7 g 1 Taking  . bismuth subsalicylate (PEPTO BISMOL) 262 MG/15ML suspension Take 5 mLs by mouth 4 (four) times daily as needed for diarrhea or loose stools (or nausea).   Taking  . busPIRone (BUSPAR) 10 MG tablet Take 10 mg by mouth 2 (two) times daily.    Taking  . Dextromethorphan-Benzocaine (SORE THROAT & COUGH LOZENGES MT) Use as directed 1 lozenge in the mouth or throat 4 (four) times daily as needed (for cough).   Taking  . diclofenac sodium (VOLTAREN) 1 % GEL Apply 2 g topically 2 (two) times daily.    Taking  . diltiazem (CARDIZEM CD) 180 MG 24 hr capsule Take 180 mg by mouth daily.    Taking  . donepezil (ARICEPT) 10  MG tablet Take 1 tablet (10 mg total) by mouth daily. 30 tablet 3 Taking  . doxycycline (VIBRAMYCIN) 50 MG capsule Take 1 capsule by mouth 2 (two) times daily.     . ferrous sulfate 325 (65 FE) MG tablet Take 1 tablet (325 mg total) by mouth 2 (two) times daily with a meal. 60 tablet 3 Taking  . Fluticasone-Salmeterol (ADVAIR) 250-50 MCG/DOSE AEPB Inhale 1 puff into the lungs 2 (two) times daily.   Taking  . glimepiride (AMARYL) 1 MG tablet Take 1 tablet by mouth 2 (two) times daily.     Marland Kitchen guaifenesin (ROBITUSSIN) 100 MG/5ML syrup Take 100 mg by mouth 2 (two) times daily as needed for cough.   Taking  . hydrALAZINE (APRESOLINE) 50 MG tablet Take 1 tablet (50 mg total) by mouth 3 (three) times daily. 90 tablet 0 Taking  . lamoTRIgine (LAMICTAL) 100 MG tablet Take 100 mg by mouth 2 (two) times daily.   Taking  . latanoprost (XALATAN) 0.005 % ophthalmic solution Place 1 drop into both eyes at bedtime.    Taking  . nicotine (NICODERM CQ - DOSED IN MG/24 HR) 7 mg/24hr patch Place 1 patch (7 mg total) onto the skin daily. 15 patch 0 Taking  . ondansetron (ZOFRAN) 4 MG tablet Take 1 tablet (4 mg total) by mouth every 8 (eight) hours  as needed for nausea or vomiting. 20 tablet 0 Taking  . pantoprazole (PROTONIX) 40 MG tablet Take 1 tablet (40 mg total) by mouth 2 (two) times daily before a meal. 60 tablet 2 Taking  . phenylephrine-shark liver oil-mineral oil-petrolatum (PREPARATION H) 0.25-3-14-71.9 % rectal ointment Place 1 application rectally 2 (two) times daily as needed for hemorrhoids.   Taking  . polyethylene glycol (MIRALAX / GLYCOLAX) packet Take 17 g by mouth daily as needed for mild constipation.   Taking  . potassium chloride SA (K-DUR,KLOR-CON) 20 MEQ tablet Take 20 mEq by mouth daily.   Taking  . predniSONE (DELTASONE) 10 MG tablet Take 30 mg by mouth every other day.   Taking  . pyridostigmine (MESTINON) 60 MG tablet Take 60 mg by mouth 3 (three) times daily.    Taking  . QUEtiapine  (SEROQUEL) 200 MG tablet Take 1 tablet (200 mg total) by mouth at bedtime. 30 tablet 3 Taking  . sodium chloride (OCEAN) 0.65 % SOLN nasal spray Place 2 sprays into both nostrils daily as needed for congestion.    Taking  . traMADol (ULTRAM) 50 MG tablet Take 50 mg by mouth 2 (two) times daily as needed.   Taking  . traZODone (DESYREL) 50 MG tablet Take 1 tablet (50 mg total) by mouth at bedtime. 30 tablet 0 Taking    Assessment: Patient is a 69 yo female receiving Heparin drip for DVT in right upper extremity DVT.   HL at 0500 on 10/13: 0.42  Goal of Therapy:  Heparin level 0.3-0.7 units/ml Monitor platelets by anticoagulation protocol: Yes   Plan:  Will continue this pt on current rate of 1050 units/hr. Will recheck HL and CBC on 10/14 with AM labs.   1014 0450 heparin level therapeutic, continue current rate, will recheck with AM labs.   Pharmacy will continue to follow.  Laural Benes, Pharm.D.  Clinical Pharmacist 06/22/2015,6:06 AM

## 2015-06-22 NOTE — Care Management (Signed)
Per Dr. Beryl Meager progress notes Mackenzie Rose is having myasthemia exacerbation. Recommending IVIG x 3 days, but Mackenzie Rose didn't want to participate with NIFF . Dr. Rayann Heman is planning to place a PEG on Monday. Would like Mackenzie Rose to continue with the heparin drip as long as possible (DVT of arm). Corianne Batten, Primary school teacher here. Will re-assess Mackenzie Rose for admission to [facility] after procedure on Monday. Shelbie Ammons RN MSN Care Management [phone]

## 2015-06-22 NOTE — Consult Note (Signed)
CC: Myasthenia gravis.   HPI: Mackenzie Rose is an 69 y.o. female history COPD, dementia, myasthenia gravis on pyrostigmine, Admitted with acute respiratory failure thought secondary to CHF and aspiration pneumonia. Pt does have long history of myasthenia but does not look like she has had any exacerbations needing intubation for myasthenic crisis.    Past Medical History  Diagnosis Date  . Heart disease   . Atrial fibrillation ([place])     a. reported a-fib; b. unknown chronicity; c. dates back to 1970s; d. not on long term anticoagulation 2/2 no documented a-fib  . Hypertension   . High cholesterol   . Diabetes ([place])     borderline  . COPD (chronic obstructive pulmonary disease) ([place])   . Melanoma ([place])   . Kidney disorder   . Tremor   . Autoimmune disease ([place])   . Depressed   . Anxiety disorder   . Migraine   . Dementia   . Motion sickness   . Myasthenia gravis ([place])   . Bipolar disorder ([place])   . Dementia   . Acute renal failure ([place])   . Near syncope     Past Surgical History  Procedure Laterality Date  . Tubal ligation    . Retena repair    . Cataract extraction    . Cholecystectomy    . Esophagogastroduodenoscopy (egd) with propofol N/A 05/22/2015    Procedure: ESOPHAGOGASTRODUODENOSCOPY (EGD) WITH PROPOFOL;  Surgeon: Lucilla Lame, MD;  Location: [facility] ENDOSCOPY;  Service: Endoscopy;  Laterality: N/A;    Family History  Problem Relation Age of Onset  . Heart disease Father   . Other Mother     ruptured hernia  . Breast cancer Sister   . Heart disease Sister   . Diabetes Mellitus II Sister     x2    Social History:  reports that she has been smoking Cigarettes.  She started smoking about 50 years ago. She has a 100 pack-year smoking history. She has never used smokeless tobacco. She reports that she does not drink alcohol or use illicit drugs.  Allergies  Allergen Reactions  . Lactose Intolerance (Gi) Other (See Comments)    Reaction:  GI upset   . Lithium  Other (See Comments)    Reaction:  Unknown   . Penicillins Rash    Patient tolerated Ampicillin-Sulbactam      Physical Examination: Blood pressure 140/72, pulse 60, temperature 97.9 F (36.6 C), temperature source Oral, resp. rate 20, height 5\' 4"  (1.626 m), weight 62.37 kg (137 lb 8 oz), SpO2 95 %.  Generalized weakness, able to tell name but not date or time 4/5 all extremities Sometimes does not want to participate CN's intact   Laboratory Studies:   Basic Metabolic Panel:  Recent Labs Lab 06/17/15 0041 06/18/15 0339 06/19/15 0119 06/20/15 0443 06/22/15 0450  NA 145 147*  149* 152* 149* 145  K 5.0 4.3  4.4 3.9 3.4* 3.9  CL 104 106  105 111 112* 111  CO2 33* 33*  33* 32 30 29  GLUCOSE 335* 275*  275* 137* 113* 93  BUN 68* 80*  87* 82* 60* 32*  CREATININE 1.73* 1.77*  1.73* 1.62* 1.51* 1.28*  CALCIUM 8.8* 8.8*  9.0 9.3 8.8* 8.6*  PHOS  --  3.4  --   --   --     Liver Function Tests:  Recent Labs Lab 06/18/15 0339  ALBUMIN 2.2*   No results for input(s): LIPASE, AMYLASE in the last 168 hours. No  results for input(s): AMMONIA in the last 168 hours.  CBC:  Recent Labs Lab 06/18/15 0339 06/19/15 0119 06/20/15 0443 06/21/15 0510 06/22/15 0450  WBC 14.4* 16.0* 10.8 12.7* 10.5  HGB 9.6* 11.0* 9.9* 10.1* 9.6*  HCT 30.8* 36.8 32.5* 33.2* 31.0*  MCV 81.4 82.6 82.6 83.1 83.4  PLT 249 329 261 267 225    Cardiac Enzymes: No results for input(s): CKTOTAL, CKMB, CKMBINDEX, TROPONINI in the last 168 hours.  BNP: Invalid input(s): POCBNP  CBG:  Recent Labs Lab 06/21/15 1622 06/21/15 2101 06/22/15 0004 06/22/15 0351 06/22/15 0713  GLUCAP 90 105* 98 95 91    Microbiology: Results for orders placed or performed during the hospital encounter of 06/08/15  MRSA PCR Screening     Status: None   Collection Time: 06/09/15  5:26 AM  Result Value Ref Range Status   MRSA by PCR NEGATIVE NEGATIVE Final    Comment:        The GeneXpert MRSA Assay  (FDA approved for NASAL specimens only), is one component of a comprehensive MRSA colonization surveillance program. It is not intended to diagnose MRSA infection nor to guide or monitor treatment for MRSA infections.   C difficile quick scan w PCR reflex     Status: None   Collection Time: 06/17/15  8:36 AM  Result Value Ref Range Status   C Diff antigen NEGATIVE NEGATIVE Final   C Diff toxin NEGATIVE NEGATIVE Final   C Diff interpretation Negative for C. difficile  Final    Coagulation Studies: No results for input(s): LABPROT, INR in the last 72 hours.  Urinalysis: No results for input(s): COLORURINE, LABSPEC, PHURINE, GLUCOSEU, HGBUR, BILIRUBINUR, KETONESUR, PROTEINUR, UROBILINOGEN, NITRITE, LEUKOCYTESUR in the last 168 hours.  Invalid input(s): APPERANCEUR  Lipid Panel:     Component Value Date/Time   CHOL 183 06/11/2015 0413   CHOL 201* 08/21/2013 0603   TRIG 288* 06/17/2015 1205   TRIG 199 08/21/2013 0603   HDL 46 06/11/2015 0413   HDL 32* 08/21/2013 0603   CHOLHDL 4.0 06/11/2015 0413   VLDL 32 06/11/2015 0413   VLDL 40 08/21/2013 0603   LDLCALC 105* 06/11/2015 0413   LDLCALC 129* 08/21/2013 0603    HgbA1C:  Lab Results  Component Value Date   HGBA1C 6.5* 03/25/2015    Urine Drug Screen:     Component Value Date/Time   LABOPIA NEGATIVE 08/13/2013 1416   LABBENZ NEGATIVE 08/13/2013 1416   AMPHETMU NEGATIVE 08/13/2013 1416   THCU NEGATIVE 08/13/2013 1416   LABBARB NEGATIVE 08/13/2013 1416    Alcohol Level: No results for input(s): ETH in the last 168 hours.  Imaging: No results found.   Assessment/Plan:  69 y.o. female history COPD, dementia, myasthenia gravis on pyrostigmine, Admitted with acute respiratory failure thought secondary to CHF and aspiration pneumonia. Pt does have long history of myasthenia but does not look like she has had any exacerbations needing intubation for myasthenic crisis.    I am not convinced this is true myasthenia  exacerbation, but am ok with trial of IVIG x 3 days at 0.4mg /kg.  She did not want to participate in obtaining NIFF today.  Her weakness is generalized and not just bulbar. Myasthenic crisis progression is much more rapid usually.   06/22/2015, 10:19 AM

## 2015-06-22 NOTE — Progress Notes (Signed)
Tried on two separate occasions this am to perform NIF maneuver with patient.  I was unable to obtain any results. Patient is unable to follow instructions in order to correctly perform the maneuver.

## 2015-06-22 NOTE — Plan of Care (Signed)
Problem: Discharge Progression Outcomes Goal: Other Discharge Outcomes/Goals Outcome: Progressing Plan of Care Progress to Goals: Hemo: VSS. O2 @ 3L continues. O2 sats in 90's. Heparin gtt continues. IV fluids continue Pain: Pt with no complaints of pain or discomfort Activity: Pt is total care. Incontinent of bowel and bladder Discharge: Pt to go to [facility] @ discharge.

## 2015-06-23 LAB — GLUCOSE, CAPILLARY
GLUCOSE-CAPILLARY: 88 mg/dL (ref 65–99)
GLUCOSE-CAPILLARY: 109 mg/dL — AB (ref 65–99)
Glucose-Capillary: 95 mg/dL (ref 65–99)
Glucose-Capillary: 93 mg/dL (ref 65–99)
Glucose-Capillary: 94 mg/dL (ref 65–99)

## 2015-06-23 LAB — HEPARIN LEVEL (UNFRACTIONATED): Heparin Unfractionated: 0.61 IU/mL (ref 0.30–0.70)

## 2015-06-23 MED ORDER — GUAIFENESIN-DM 100-10 MG/5ML PO SYRP
5.0000 mL | ORAL_SOLUTION | ORAL | Status: DC | PRN
  Administered 2015-06-23 – 2015-06-24 (×2): 5 mL via ORAL
  Filled 2015-06-23 (×2): qty 5

## 2015-06-23 NOTE — Progress Notes (Addendum)
69 year old female admitted for Hyperkalemia, Acute Renal Insufficiency and Acute CHF. See past medical hx. Alert oriented x4 during shift. No reports of pain. Had multiple soft stools today. Charge RN is aware. See documentation. Pt tolerated IV heparin infusion, IV Dextrose and IV immunoglobulin well. Did reported persistent cough in which cough syrup was ordered and administered. Pt reported significant improvement. Double PICC dressing completed this shift. Educated patient w/ handouts on Pyridostigmine injection and HF. Stressed importance of daily weights, monitor pulse, heart healthy diet and when to call physician. Understanding voiced but will need reinforcement at time of discharge.   Problem: Discharge Progression Outcomes Goal: Other Discharge Outcomes/Goals Plan of care progress to goal: Pain; denies Hemodynamically:VSS Diet: npo for Failed barium swallow, waiitng PEG placement Activity: refuses to turn in bed

## 2015-06-23 NOTE — Plan of Care (Signed)
Problem: Discharge Progression Outcomes Goal: Other Discharge Outcomes/Goals Plan of care progress to goal: Pain; denies Hemodynamically:VSS Diet: npo for  Failed barium swallow, waiitng PEG placement Activity: refuses to turn in bed

## 2015-06-23 NOTE — Progress Notes (Signed)
Patient performed several tries this morning with NIF maneuver. Patient greatest NIF was -20 cmH2O.  Patient able to follow instructions a little better today.

## 2015-06-23 NOTE — Consult Note (Signed)
CC: Myasthenia gravis.   HPI: Mackenzie Rose is an 69 y.o. female history COPD, dementia, myasthenia gravis on pyrostigmine, Admitted with acute respiratory failure thought secondary to CHF and aspiration pneumonia. Pt does have long history of myasthenia but does not look like she has had any exacerbations needing intubation for myasthenic crisis.   Appears slightly more alert today  Past Medical History  Diagnosis Date  . Heart disease   . Atrial fibrillation ([place])     a. reported a-fib; b. unknown chronicity; c. dates back to 1970s; d. not on long term anticoagulation 2/2 no documented a-fib  . Hypertension   . High cholesterol   . Diabetes ([place])     borderline  . COPD (chronic obstructive pulmonary disease) ([place])   . Melanoma ([place])   . Kidney disorder   . Tremor   . Autoimmune disease ([place])   . Depressed   . Anxiety disorder   . Migraine   . Dementia   . Motion sickness   . Myasthenia gravis ([place])   . Bipolar disorder ([place])   . Dementia   . Acute renal failure ([place])   . Near syncope     Past Surgical History  Procedure Laterality Date  . Tubal ligation    . Retena repair    . Cataract extraction    . Cholecystectomy    . Esophagogastroduodenoscopy (egd) with propofol N/A 05/22/2015    Procedure: ESOPHAGOGASTRODUODENOSCOPY (EGD) WITH PROPOFOL;  Surgeon: Lucilla Lame, MD;  Location: [facility] ENDOSCOPY;  Service: Endoscopy;  Laterality: N/A;    Family History  Problem Relation Age of Onset  . Heart disease Father   . Other Mother     ruptured hernia  . Breast cancer Sister   . Heart disease Sister   . Diabetes Mellitus II Sister     x2    Social History:  reports that she has been smoking Cigarettes.  She started smoking about 50 years ago. She has a 100 pack-year smoking history. She has never used smokeless tobacco. She reports that she does not drink alcohol or use illicit drugs.  Allergies  Allergen Reactions  . Lactose Intolerance (Gi) Other (See Comments)     Reaction:  GI upset   . Lithium Other (See Comments)    Reaction:  Unknown   . Penicillins Rash    Patient tolerated Ampicillin-Sulbactam      Physical Examination: Blood pressure 146/84, pulse 85, temperature 98.9 F (37.2 C), temperature source Oral, resp. rate 20, height 5\' 4"  (1.626 m), weight 60.737 kg (133 lb 14.4 oz), SpO2 99 %.  Generalized weakness, able to tell name but not date or time 4/5 all extremities Sometimes does not want to participate CN's intact   Laboratory Studies:   Basic Metabolic Panel:  Recent Labs Lab 06/17/15 0041 06/18/15 0339 06/19/15 0119 06/20/15 0443 06/22/15 0450  NA 145 147*  149* 152* 149* 145  K 5.0 4.3  4.4 3.9 3.4* 3.9  CL 104 106  105 111 112* 111  CO2 33* 33*  33* 32 30 29  GLUCOSE 335* 275*  275* 137* 113* 93  BUN 68* 80*  87* 82* 60* 32*  CREATININE 1.73* 1.77*  1.73* 1.62* 1.51* 1.28*  CALCIUM 8.8* 8.8*  9.0 9.3 8.8* 8.6*  PHOS  --  3.4  --   --   --     Liver Function Tests:  Recent Labs Lab 06/18/15 0339  ALBUMIN 2.2*   No results for input(s): LIPASE, AMYLASE in  the last 168 hours. No results for input(s): AMMONIA in the last 168 hours.  CBC:  Recent Labs Lab 06/18/15 0339 06/19/15 0119 06/20/15 0443 06/21/15 0510 06/22/15 0450  WBC 14.4* 16.0* 10.8 12.7* 10.5  HGB 9.6* 11.0* 9.9* 10.1* 9.6*  HCT 30.8* 36.8 32.5* 33.2* 31.0*  MCV 81.4 82.6 82.6 83.1 83.4  PLT 249 329 261 267 225    Cardiac Enzymes: No results for input(s): CKTOTAL, CKMB, CKMBINDEX, TROPONINI in the last 168 hours.  BNP: Invalid input(s): POCBNP  CBG:  Recent Labs Lab 06/22/15 1956 06/22/15 2359 06/23/15 0352 06/23/15 0740 06/23/15 1138  GLUCAP 105* 94 95 93 88    Microbiology: Results for orders placed or performed during the hospital encounter of 06/08/15  MRSA PCR Screening     Status: None   Collection Time: 06/09/15  5:26 AM  Result Value Ref Range Status   MRSA by PCR NEGATIVE NEGATIVE Final     Comment:        The GeneXpert MRSA Assay (FDA approved for NASAL specimens only), is one component of a comprehensive MRSA colonization surveillance program. It is not intended to diagnose MRSA infection nor to guide or monitor treatment for MRSA infections.   C difficile quick scan w PCR reflex     Status: None   Collection Time: 06/17/15  8:36 AM  Result Value Ref Range Status   C Diff antigen NEGATIVE NEGATIVE Final   C Diff toxin NEGATIVE NEGATIVE Final   C Diff interpretation Negative for C. difficile  Final    Coagulation Studies: No results for input(s): LABPROT, INR in the last 72 hours.  Urinalysis: No results for input(s): COLORURINE, LABSPEC, PHURINE, GLUCOSEU, HGBUR, BILIRUBINUR, KETONESUR, PROTEINUR, UROBILINOGEN, NITRITE, LEUKOCYTESUR in the last 168 hours.  Invalid input(s): APPERANCEUR  Lipid Panel:     Component Value Date/Time   CHOL 183 06/11/2015 0413   CHOL 201* 08/21/2013 0603   TRIG 288* 06/17/2015 1205   TRIG 199 08/21/2013 0603   HDL 46 06/11/2015 0413   HDL 32* 08/21/2013 0603   CHOLHDL 4.0 06/11/2015 0413   VLDL 32 06/11/2015 0413   VLDL 40 08/21/2013 0603   LDLCALC 105* 06/11/2015 0413   LDLCALC 129* 08/21/2013 0603    HgbA1C:  Lab Results  Component Value Date   HGBA1C 6.5* 03/25/2015    Urine Drug Screen:      Component Value Date/Time   LABOPIA NEGATIVE 08/13/2013 1416   LABBENZ NEGATIVE 08/13/2013 1416   AMPHETMU NEGATIVE 08/13/2013 1416   THCU NEGATIVE 08/13/2013 1416   LABBARB NEGATIVE 08/13/2013 1416    Alcohol Level: No results for input(s): ETH in the last 168 hours.  Imaging: Dg Loyce Dys Tube Plc W/fl W/rad  06/22/2015  CLINICAL DATA:  Feeding tube placement request. EXAM: NASO G TUBE PLACEMENT WITH FL AND WITH RAD CONTRAST:  None FLUOROSCOPY TIME:  Radiation Exposure Index (as provided by the fluoroscopic device): 1.4 mGy COMPARISON:  None. FINDINGS: Following passage of the Dobhoff tube into the nasal cavity the  patient adamantly refused to continue with the of procedure. The importance of this procedure was discussed with the patient, nevertheless the patient refused the procedure. The procedure was terminated and the patient return to our room without complication. The patient's nurse and physician were informed IMPRESSION: Patient refused placement of Dobhoff tube. Electronically Signed   By: Stover   On: 06/22/2015 15:44     Assessment/Plan:  69 y.o. female history COPD, dementia, myasthenia gravis on pyrostigmine,  Admitted with acute respiratory failure thought secondary to CHF and aspiration pneumonia. Pt does have long history of myasthenia but does not look like she has had any exacerbations needing intubation for myasthenic crisis.   Appears slightly more alert today and asking to be fed.   IVIG for 2 more days, likely not myasthenic crisis but not 353% certain that the generalized weakness is not contributed by her history of MG.     06/23/2015, 1:16 PM

## 2015-06-23 NOTE — Progress Notes (Signed)
ANTICOAGULATION CONSULT NOTE - Follow Up Consult  Pharmacy Consult for Heparin Indication: DVT  Allergies  Allergen Reactions  . Lactose Intolerance (Gi) Other (See Comments)    Reaction:  GI upset   . Lithium Other (See Comments)    Reaction:  Unknown   . Penicillins Rash    Patient tolerated Ampicillin-Sulbactam     Patient Measurements: Height: 5\' 4"  (162.6 cm) Weight: 133 lb 14.4 oz (60.737 kg) IBW/kg (Calculated) : 54.7 Heparin Dosing Weight:   Vital Signs: Temp: 98.9 F (37.2 C) (10/15 0500) Temp Source: Oral (10/15 0500) BP: 146/84 mmHg (10/15 0500) Pulse Rate: 81 (10/15 0500)  Labs:  Recent Labs  06/21/15 0510 06/22/15 0450 06/23/15 0551  HGB 10.1* 9.6*  --   HCT 33.2* 31.0*  --   PLT 267 225  --   HEPARINUNFRC 0.42 0.52 0.61  CREATININE  --  1.28*  --     Estimated Creatinine Clearance: 34.8 mL/min (by C-G formula based on Cr of 1.28).   Medications:  Prescriptions prior to admission  Medication Sig Dispense Refill Last Dose  . acetaminophen (TYLENOL) 650 MG CR tablet Take 650 mg by mouth daily. Pt takes daily at 2pm and every four hours as needed for pain.   Taking  . albuterol (PROVENTIL HFA;VENTOLIN HFA) 108 (90 BASE) MCG/ACT inhaler Inhale 2 puffs into the lungs every 4 (four) hours as needed for wheezing or shortness of breath. 6.7 g 1 Taking  . bismuth subsalicylate (PEPTO BISMOL) 262 MG/15ML suspension Take 5 mLs by mouth 4 (four) times daily as needed for diarrhea or loose stools (or nausea).   Taking  . busPIRone (BUSPAR) 10 MG tablet Take 10 mg by mouth 2 (two) times daily.    Taking  . Dextromethorphan-Benzocaine (SORE THROAT & COUGH LOZENGES MT) Use as directed 1 lozenge in the mouth or throat 4 (four) times daily as needed (for cough).   Taking  . diclofenac sodium (VOLTAREN) 1 % GEL Apply 2 g topically 2 (two) times daily.    Taking  . diltiazem (CARDIZEM CD) 180 MG 24 hr capsule Take 180 mg by mouth daily.    Taking  . donepezil (ARICEPT)  10 MG tablet Take 1 tablet (10 mg total) by mouth daily. 30 tablet 3 Taking  . doxycycline (VIBRAMYCIN) 50 MG capsule Take 1 capsule by mouth 2 (two) times daily.     . ferrous sulfate 325 (65 FE) MG tablet Take 1 tablet (325 mg total) by mouth 2 (two) times daily with a meal. 60 tablet 3 Taking  . Fluticasone-Salmeterol (ADVAIR) 250-50 MCG/DOSE AEPB Inhale 1 puff into the lungs 2 (two) times daily.   Taking  . glimepiride (AMARYL) 1 MG tablet Take 1 tablet by mouth 2 (two) times daily.     Marland Kitchen guaifenesin (ROBITUSSIN) 100 MG/5ML syrup Take 100 mg by mouth 2 (two) times daily as needed for cough.   Taking  . hydrALAZINE (APRESOLINE) 50 MG tablet Take 1 tablet (50 mg total) by mouth 3 (three) times daily. 90 tablet 0 Taking  . lamoTRIgine (LAMICTAL) 100 MG tablet Take 100 mg by mouth 2 (two) times daily.   Taking  . latanoprost (XALATAN) 0.005 % ophthalmic solution Place 1 drop into both eyes at bedtime.    Taking  . nicotine (NICODERM CQ - DOSED IN MG/24 HR) 7 mg/24hr patch Place 1 patch (7 mg total) onto the skin daily. 15 patch 0 Taking  . ondansetron (ZOFRAN) 4 MG tablet Take 1 tablet (4  mg total) by mouth every 8 (eight) hours as needed for nausea or vomiting. 20 tablet 0 Taking  . pantoprazole (PROTONIX) 40 MG tablet Take 1 tablet (40 mg total) by mouth 2 (two) times daily before a meal. 60 tablet 2 Taking  . phenylephrine-shark liver oil-mineral oil-petrolatum (PREPARATION H) 0.25-3-14-71.9 % rectal ointment Place 1 application rectally 2 (two) times daily as needed for hemorrhoids.   Taking  . polyethylene glycol (MIRALAX / GLYCOLAX) packet Take 17 g by mouth daily as needed for mild constipation.   Taking  . potassium chloride SA (K-DUR,KLOR-CON) 20 MEQ tablet Take 20 mEq by mouth daily.   Taking  . predniSONE (DELTASONE) 10 MG tablet Take 30 mg by mouth every other day.   Taking  . pyridostigmine (MESTINON) 60 MG tablet Take 60 mg by mouth 3 (three) times daily.    Taking  . QUEtiapine  (SEROQUEL) 200 MG tablet Take 1 tablet (200 mg total) by mouth at bedtime. 30 tablet 3 Taking  . sodium chloride (OCEAN) 0.65 % SOLN nasal spray Place 2 sprays into both nostrils daily as needed for congestion.    Taking  . traMADol (ULTRAM) 50 MG tablet Take 50 mg by mouth 2 (two) times daily as needed.   Taking  . traZODone (DESYREL) 50 MG tablet Take 1 tablet (50 mg total) by mouth at bedtime. 30 tablet 0 Taking    Assessment: Patient is a 69 yo female receiving Heparin drip for DVT in right upper extremity DVT.   HL at 0500 on 10/13: 0.42  Goal of Therapy:  Heparin level 0.3-0.7 units/ml Monitor platelets by anticoagulation protocol: Yes   Plan:  Will continue this pt on current rate of 1050 units/hr. Will recheck HL and CBC on 10/14 with AM labs.   1014 0450 heparin level therapeutic, continue current rate, will recheck with AM labs.   1015 AM heparin level therapeutic, continue current rate, will recheck with AM labs.   Pharmacy will continue to follow.  Laural Benes, Pharm.D.  Clinical Pharmacist 06/23/2015,6:33 AM

## 2015-06-23 NOTE — Progress Notes (Signed)
[place] at [place] NAME: Mackenzie Rose    MR#:  [id number]  DATE OF BIRTH:  08/25/1946  SUBJECTIVE:   Remains lethargic and weak, also confused.  Seen by GI and plan for doing PEG tube on Monday.  Also seen by Neuro and started on IVIG for Myasthenia.  Feels comfortable. Denies shortness of breath.  Pt. Says she is "hungry", tells me that she's been eating, although she is nothing by mouth due to significant dysphagia. Admits of some cough     Review of Systems  Constitutional: Negative for fever and chills.  HENT: Negative for congestion and tinnitus.   Eyes: Negative for blurred vision and double vision.  Respiratory: Positive for cough and sputum production. Negative for shortness of breath and wheezing.   Cardiovascular: Negative for chest pain, orthopnea and PND.  Gastrointestinal: Negative for nausea, vomiting, abdominal pain and diarrhea.  Genitourinary: Negative for dysuria and hematuria.  Neurological: Positive for weakness (generalized). Negative for dizziness, sensory change and focal weakness.  All other systems reviewed and are negative.  Tolerating Diet: Evaluated by speech and high risk for aspiration and will keep nothing by mouth Tolerating PT: Evaluation noted.   DRUG ALLERGIES:   Allergies  Allergen Reactions  . Lactose Intolerance (Gi) Other (See Comments)    Reaction:  GI upset   . Lithium Other (See Comments)    Reaction:  Unknown   . Penicillins Rash    Patient tolerated Ampicillin-Sulbactam     VITALS:  Blood pressure 146/84, pulse 85, temperature 98.9 F (37.2 C), temperature source Oral, resp. rate 20, height 5\' 4"  (1.626 m), weight 60.737 kg (133 lb 14.4 oz), SpO2 99 %.  PHYSICAL EXAMINATION:   Physical Exam  GENERAL:  69 y.o.-year-old patient lying in the bed globally weak and lethargic  EYES: pupils are equally reactive to light and accommodation. No scleral icterus. HEENT: Head  atraumatic, normocephalic. Oropharynx and nasopharynx clear.   NECK:  Supple, no jugular venous distention. No thyroid enlargement, no tenderness.  LUNGS: Poor respiratory effort. Positive upper airway rhonchi, no rales, wheezes. No use of accessory muscles of respiration.  CARDIOVASCULAR: S1, S2 normal. No murmurs, rubs, or gallops.  ABDOMEN: Soft, nontender, nondistended. Bowel sounds present. No organomegaly or mass.  EXTREMITIES: No cyanosis, clubbing, +1 edema bilaterally.  Right upper ext. Edema and wamth much improved.  NEUROLOGIC: Globally weak but no other focal motor or sensory deficits bilaterally.   PSYCHIATRIC: alert, oriented x 3. Good affect  SKIN: No ulcer, rash, lesions  LABORATORY PANEL:   CBC  Recent Labs Lab 06/22/15 0450  WBC 10.5  HGB 9.6*  HCT 31.0*  PLT 225    Chemistries   Recent Labs Lab 06/22/15 0450  NA 145  K 3.9  CL 111  CO2 29  GLUCOSE 93  BUN 32*  CREATININE 1.28*  CALCIUM 8.6*    Cardiac Enzymes No results for input(s): TROPONINI in the last 168 hours.  RADIOLOGY:  Dg Loyce Dys Tube Plc W/fl W/rad  06/22/2015  CLINICAL DATA:  Feeding tube placement request. EXAM: NASO G TUBE PLACEMENT WITH FL AND WITH RAD CONTRAST:  None FLUOROSCOPY TIME:  Radiation Exposure Index (as provided by the fluoroscopic device): 1.4 mGy COMPARISON:  None. FINDINGS: Following passage of the Dobhoff tube into the nasal cavity the patient adamantly refused to continue with the of procedure. The importance of this procedure was discussed with the patient, nevertheless the patient refused the procedure. The  procedure was terminated and the patient return to our room without complication. The patient's nurse and physician were informed IMPRESSION: Patient refused placement of Dobhoff tube. Electronically Signed   By: Marcello Moores  Register   On: 06/22/2015 15:44     ASSESSMENT AND PLAN:  69 y.o. female who presents with shortness of breath, worsening over the last 3-4 days.  Patient states that she's also had some intermittent chest pain throughout the same time. She states that she's had some episodes of acute CHF in the past, but denies being chronically followed for CHF  1. acute hypoxic respiratory failure secondary to Acute systolic CHF (congestive heart failure) and Aspiration PNA -Patient has been intubated and then extubated twice during the hospital course. Her respiratory status is tenuous given her high risk for aspiration. Clinically she does not appear to be in congestive heart failure presently. -She was diuresed with IV Lasix and responded well -Patient had a modified barium swallow  which was high risk for aspiration. Seen by GI and plan to place PEG tube on Monday.  Continue to wean off oxygen as tolerated, now on 2 L  2. Acute on Chronic RF (acute renal failure) with known history of chronic renal failure stage III  -Creatinine 1.2 , October 13 and close to baseline. Continue to monitor, recheck level tomorrow morning -Renal ultrasound bilateral kidney cysts.  3. Myasthenia gravis - appreciate Neuro input and they do not think her weakness is related to Myasthenia crisis but will empirically start on IVIG X 3 days and monitor.  - cont. Pyridostigmine  4.HTN (hypertension)-stable  -continue coreg, hydralazine, spironolactone  5.Type 2 diabetes mellitus-blood sugar stable and continue sliding scale insulin   6.COPD (chronic obstructive pulmonary disease) - continue duo nebs.    7.h/o chronic afib -Rate controlled and continue on coreg.  -High risk for falls and therefore not on anticoagulation.  8.Bipolar affective disorder, mixed - continue Lamictal  9. Right groin skin rash appears candidial-much improved -Continue Nystatin powder   10. Right upper extremity DVT-continue heparin drip. - will switch to Oral meds once PEG tube placed.      Appreciate Palliative care help and pt. Is a DNR now.  Discussed w/ Case Manager and pt. Could be a  candidate for LTAC placement once PEG tube is placed on Monday.    CODE STATUS:FULL  DVT Prophylaxis: Heparin gtt.   TOTAL TIME Spent in CARE OF THIS PATIENT: 40 minutes.  Coordination of care. 27 minutes  Shafin Pollio M.D on 06/23/2015 at 9:03 AM  Between 7am to 6pm - Pager - [phone]  After 6pm go to [email or web address] - password [email or web address] [facility]  Office  [phone]  CC: Primary care physician; Donato Schultz, MD

## 2015-06-23 NOTE — Progress Notes (Signed)
Physical Therapy Treatment Patient Details Name: Mackenzie Rose MRN: [id number] DOB: Dec 20, 1945 Today's Date: 06/23/2015    History of Present Illness 69 yo female with onset of SOB and chest pain was admitted from SNF, planning to return.  PMHx:  a-fib, COPD, HTN, acute respiratory failure, DM, bippolar, myasthenia gravis    PT Comments    Pt continues to be self limiting.  Pt agreeable to bed therex and attempting to sit at EOB.  Pt required Mod A +2 to get into sitting and could only tolerate upright position briefly before becoming very dizzy and lying back down on bed.  Pt also with c/o back and R leg pain.  Cont with POC.  Follow Up Recommendations  SNF     Equipment Recommendations  None recommended by PT    Recommendations for Other Services       Precautions / Restrictions Precautions Precautions: Fall    Mobility  Bed Mobility Overal bed mobility: +2 for physical assistance Bed Mobility: Supine to Sit;Sit to Supine     Supine to sit: Mod assist;+2 for physical assistance Sit to supine: Max assist   General bed mobility comments: Limited use of UE to elevate trunk; Mod A +2 to rotate hips to edge of bed; tolerated only about 5 seconds of sitting before needing to lie back down due to severe dizziness.  Transfers                    Ambulation/Gait                 Stairs            Wheelchair Mobility    Modified Rankin (Stroke Patients Only)       Balance                                    Cognition Arousal/Alertness: Lethargic Behavior During Therapy: Flat affect Overall Cognitive Status: History of cognitive impairments - at baseline                      Exercises General Exercises - Lower Extremity Ankle Circles/Pumps: AAROM;Strengthening;Both;10 reps Quad Sets: AAROM;Strengthening;Both;10 reps Short Arc Quad: AAROM;Strengthening;Both;10 reps Heel Slides: AAROM;Both;10 reps;Strengthening Hip  ABduction/ADduction: AAROM;Strengthening;Both;10 reps    General Comments        Pertinent Vitals/Pain Pain Location: L thigh Pain Descriptors / Indicators: Aching Pain Intervention(s): Repositioned    Home Living                      Prior Function            PT Goals (current goals can now be found in the care plan section) Acute Rehab PT Goals PT Goal Formulation: Patient unable to participate in goal setting Time For Goal Achievement: 07/04/15 Potential to Achieve Goals: Fair    Frequency  Min 2X/week    PT Plan Current plan remains appropriate;Other (comment)    Co-evaluation             End of Session Equipment Utilized During Treatment: Oxygen Activity Tolerance: Patient limited by fatigue;Patient limited by pain;Patient limited by lethargy Patient left: in bed;with bed alarm set     Time: 4431-5400 PT Time Calculation (min) (ACUTE ONLY): 28 min  Charges:  $Therapeutic Exercise: 8-22 mins $Therapeutic Activity: 8-22 mins  G Codes:      Mackenzie Rose 06/23/2015, 5:30 PM

## 2015-06-23 NOTE — Progress Notes (Signed)
NIF -25

## 2015-06-23 NOTE — Progress Notes (Signed)
Pt not able to perform NIF.

## 2015-06-24 LAB — CBC
HCT: 28.9 % — ABNORMAL LOW (ref 35.0–47.0)
HEMOGLOBIN: 9.1 g/dL — AB (ref 12.0–16.0)
MCH: 26.4 pg (ref 26.0–34.0)
MCHC: 31.5 g/dL — AB (ref 32.0–36.0)
MCV: 83.7 fL (ref 80.0–100.0)
Platelets: 205 10*3/uL (ref 150–440)
RBC: 3.46 MIL/uL — ABNORMAL LOW (ref 3.80–5.20)
RDW: 25.8 % — ABNORMAL HIGH (ref 11.5–14.5)
WBC: 6.3 10*3/uL (ref 3.6–11.0)

## 2015-06-24 LAB — GLUCOSE, CAPILLARY
GLUCOSE-CAPILLARY: 105 mg/dL — AB (ref 65–99)
GLUCOSE-CAPILLARY: 111 mg/dL — AB (ref 65–99)
GLUCOSE-CAPILLARY: 96 mg/dL (ref 65–99)
Glucose-Capillary: 111 mg/dL — ABNORMAL HIGH (ref 65–99)
Glucose-Capillary: 137 mg/dL — ABNORMAL HIGH (ref 65–99)
Glucose-Capillary: 160 mg/dL — ABNORMAL HIGH (ref 65–99)
Glucose-Capillary: 94 mg/dL (ref 65–99)

## 2015-06-24 LAB — BASIC METABOLIC PANEL
Anion gap: 5 (ref 5–15)
BUN: 13 mg/dL (ref 6–20)
CALCIUM: 8.5 mg/dL — AB (ref 8.9–10.3)
CHLORIDE: 111 mmol/L (ref 101–111)
CO2: 28 mmol/L (ref 22–32)
CREATININE: 1.08 mg/dL — AB (ref 0.44–1.00)
GFR calc non Af Amer: 50 mL/min — ABNORMAL LOW (ref >60)
GFR, EST AFRICAN AMERICAN: 58 mL/min — AB (ref >60)
Glucose, Bld: 105 mg/dL — ABNORMAL HIGH (ref 65–99)
Potassium: 3.4 mmol/L — ABNORMAL LOW (ref 3.5–5.1)
SODIUM: 144 mmol/L (ref 135–145)

## 2015-06-24 LAB — HEPARIN LEVEL (UNFRACTIONATED): HEPARIN UNFRACTIONATED: 0.68 [IU]/mL (ref 0.30–0.70)

## 2015-06-24 NOTE — Progress Notes (Signed)
NIF -18 cm. 

## 2015-06-24 NOTE — Progress Notes (Signed)
Pt states she is very tired this morning.  Patient performed -14 cmH20 NIF maneuver.

## 2015-06-24 NOTE — Progress Notes (Signed)
ANTICOAGULATION CONSULT NOTE - Follow Up Consult  Pharmacy Consult for Heparin Indication: DVT  Allergies  Allergen Reactions  . Lactose Intolerance (Gi) Other (See Comments)    Reaction:  GI upset   . Lithium Other (See Comments)    Reaction:  Unknown   . Penicillins Rash    Patient tolerated Ampicillin-Sulbactam     Patient Measurements: Height: 5\' 4"  (162.6 cm) Weight: 130 lb (58.968 kg) IBW/kg (Calculated) : 54.7 Heparin Dosing Weight:   Vital Signs: Temp: 98.9 F (37.2 C) (10/16 0445) Temp Source: Oral (10/16 0445) BP: 144/117 mmHg (10/16 0445) Pulse Rate: 85 (10/16 0445)  Labs:  Recent Labs  06/22/15 0450 06/23/15 0551 06/24/15 0500  HGB 9.6*  --  9.1*  HCT 31.0*  --  28.9*  PLT 225  --  205  HEPARINUNFRC 0.52 0.61 0.68  CREATININE 1.28*  --  1.08*    Estimated Creatinine Clearance: 41.3 mL/min (by C-G formula based on Cr of 1.08).   Medications:  Prescriptions prior to admission  Medication Sig Dispense Refill Last Dose  . acetaminophen (TYLENOL) 650 MG CR tablet Take 650 mg by mouth daily. Pt takes daily at 2pm and every four hours as needed for pain.   Taking  . albuterol (PROVENTIL HFA;VENTOLIN HFA) 108 (90 BASE) MCG/ACT inhaler Inhale 2 puffs into the lungs every 4 (four) hours as needed for wheezing or shortness of breath. 6.7 g 1 Taking  . bismuth subsalicylate (PEPTO BISMOL) 262 MG/15ML suspension Take 5 mLs by mouth 4 (four) times daily as needed for diarrhea or loose stools (or nausea).   Taking  . busPIRone (BUSPAR) 10 MG tablet Take 10 mg by mouth 2 (two) times daily.    Taking  . Dextromethorphan-Benzocaine (SORE THROAT & COUGH LOZENGES MT) Use as directed 1 lozenge in the mouth or throat 4 (four) times daily as needed (for cough).   Taking  . diclofenac sodium (VOLTAREN) 1 % GEL Apply 2 g topically 2 (two) times daily.    Taking  . diltiazem (CARDIZEM CD) 180 MG 24 hr capsule Take 180 mg by mouth daily.    Taking  . donepezil (ARICEPT) 10 MG  tablet Take 1 tablet (10 mg total) by mouth daily. 30 tablet 3 Taking  . doxycycline (VIBRAMYCIN) 50 MG capsule Take 1 capsule by mouth 2 (two) times daily.     . ferrous sulfate 325 (65 FE) MG tablet Take 1 tablet (325 mg total) by mouth 2 (two) times daily with a meal. 60 tablet 3 Taking  . Fluticasone-Salmeterol (ADVAIR) 250-50 MCG/DOSE AEPB Inhale 1 puff into the lungs 2 (two) times daily.   Taking  . glimepiride (AMARYL) 1 MG tablet Take 1 tablet by mouth 2 (two) times daily.     Marland Kitchen guaifenesin (ROBITUSSIN) 100 MG/5ML syrup Take 100 mg by mouth 2 (two) times daily as needed for cough.   Taking  . hydrALAZINE (APRESOLINE) 50 MG tablet Take 1 tablet (50 mg total) by mouth 3 (three) times daily. 90 tablet 0 Taking  . lamoTRIgine (LAMICTAL) 100 MG tablet Take 100 mg by mouth 2 (two) times daily.   Taking  . latanoprost (XALATAN) 0.005 % ophthalmic solution Place 1 drop into both eyes at bedtime.    Taking  . nicotine (NICODERM CQ - DOSED IN MG/24 HR) 7 mg/24hr patch Place 1 patch (7 mg total) onto the skin daily. 15 patch 0 Taking  . ondansetron (ZOFRAN) 4 MG tablet Take 1 tablet (4 mg total) by mouth  every 8 (eight) hours as needed for nausea or vomiting. 20 tablet 0 Taking  . pantoprazole (PROTONIX) 40 MG tablet Take 1 tablet (40 mg total) by mouth 2 (two) times daily before a meal. 60 tablet 2 Taking  . phenylephrine-shark liver oil-mineral oil-petrolatum (PREPARATION H) 0.25-3-14-71.9 % rectal ointment Place 1 application rectally 2 (two) times daily as needed for hemorrhoids.   Taking  . polyethylene glycol (MIRALAX / GLYCOLAX) packet Take 17 g by mouth daily as needed for mild constipation.   Taking  . potassium chloride SA (K-DUR,KLOR-CON) 20 MEQ tablet Take 20 mEq by mouth daily.   Taking  . predniSONE (DELTASONE) 10 MG tablet Take 30 mg by mouth every other day.   Taking  . pyridostigmine (MESTINON) 60 MG tablet Take 60 mg by mouth 3 (three) times daily.    Taking  . QUEtiapine (SEROQUEL)  200 MG tablet Take 1 tablet (200 mg total) by mouth at bedtime. 30 tablet 3 Taking  . sodium chloride (OCEAN) 0.65 % SOLN nasal spray Place 2 sprays into both nostrils daily as needed for congestion.    Taking  . traMADol (ULTRAM) 50 MG tablet Take 50 mg by mouth 2 (two) times daily as needed.   Taking  . traZODone (DESYREL) 50 MG tablet Take 1 tablet (50 mg total) by mouth at bedtime. 30 tablet 0 Taking    Assessment: Patient is a 69 yo female receiving Heparin drip for DVT in right upper extremity DVT.   HL at 0500 on 10/13: 0.42  Goal of Therapy:  Heparin level 0.3-0.7 units/ml Monitor platelets by anticoagulation protocol: Yes   Plan:  Will continue this pt on current rate of 1050 units/hr. Will recheck HL and CBC on 10/14 with AM labs.   1014 0450 heparin level therapeutic, continue current rate, will recheck with AM labs.   1015 AM heparin level therapeutic, continue current rate, will recheck with AM labs.   1016 AM heparin level therapeutic, continue current rate, will recheck with AM labs  Pharmacy will continue to follow.  Laural Benes, Pharm.D.  Clinical Pharmacist 06/24/2015,5:44 AM

## 2015-06-24 NOTE — Progress Notes (Signed)
[place] at [place] NAME: Mackenzie Rose    MR#:  [id number]  DATE OF BIRTH:  [birth year]  SUBJECTIVE:   Remains lethargic and weak, also confused.  Seen by GI and plan for doing PEG tube on Monday.  Also seen by Neuro and started on IVIG for Myasthenia.  Feels comfortable. Denies shortness of breath.  Requests medications to be given, in sherbettt as opposed to applesauce   Review of Systems  Constitutional: Negative for fever and chills.  HENT: Negative for congestion and tinnitus.   Eyes: Negative for blurred vision and double vision.  Respiratory: Positive for cough and sputum production. Negative for shortness of breath and wheezing.   Cardiovascular: Negative for chest pain, orthopnea and PND.  Gastrointestinal: Negative for nausea, vomiting, abdominal pain and diarrhea.  Genitourinary: Negative for dysuria and hematuria.  Neurological: Positive for weakness (generalized). Negative for dizziness, sensory change and focal weakness.  All other systems reviewed and are negative.  Tolerating Diet: Evaluated by speech and high risk for aspiration and will keep nothing by mouth Tolerating PT: Evaluation noted.   DRUG ALLERGIES:   Allergies  Allergen Reactions  . Lactose Intolerance (Gi) Other (See Comments)    Reaction:  GI upset   . Lithium Other (See Comments)    Reaction:  Unknown   . Penicillins Rash    Patient tolerated Ampicillin-Sulbactam     VITALS:  Blood pressure 166/77, pulse 83, temperature 98.7 F (37.1 C), temperature source Oral, resp. rate 22, height 5\' 4"  (1.626 m), weight 58.968 kg (130 lb), SpO2 97 %.  PHYSICAL EXAMINATION:   Physical Exam  GENERAL:  69 y.o.-year-old patient lying in the bed globally weak and lethargic  EYES: pupils are equally reactive to light and accommodation. No scleral icterus. HEENT: Head atraumatic, normocephalic. Oropharynx and nasopharynx clear.   NECK:  Supple, no jugular  venous distention. No thyroid enlargement, no tenderness.  LUNGS: Poor respiratory effort. Positive upper airway rhonchi, no rales, wheezes. No use of accessory muscles of respiration.  CARDIOVASCULAR: S1, S2 normal. No murmurs, rubs, or gallops.  ABDOMEN: Soft, nontender, nondistended. Bowel sounds present. No organomegaly or mass.  EXTREMITIES: No cyanosis, clubbing, +1 edema bilaterally.  Right upper ext. Edema and wamth much improved.  NEUROLOGIC: Globally weak but no other focal motor or sensory deficits bilaterally.   PSYCHIATRIC: alert, oriented x 3. Good affect  SKIN: No ulcer, rash, lesions  LABORATORY PANEL:   CBC  Recent Labs Lab 06/24/15 0500  WBC 6.3  HGB 9.1*  HCT 28.9*  PLT 205    Chemistries   Recent Labs Lab 06/24/15 0500  NA 144  K 3.4*  CL 111  CO2 28  GLUCOSE 105*  BUN 13  CREATININE 1.08*  CALCIUM 8.5*    Cardiac Enzymes No results for input(s): TROPONINI in the last 168 hours.  RADIOLOGY:  Dg Loyce Dys Tube Plc W/fl W/rad  06/22/2015  CLINICAL DATA:  Feeding tube placement request. EXAM: NASO G TUBE PLACEMENT WITH FL AND WITH RAD CONTRAST:  None FLUOROSCOPY TIME:  Radiation Exposure Index (as provided by the fluoroscopic device): 1.4 mGy COMPARISON:  None. FINDINGS: Following passage of the Dobhoff tube into the nasal cavity the patient adamantly refused to continue with the of procedure. The importance of this procedure was discussed with the patient, nevertheless the patient refused the procedure. The procedure was terminated and the patient return to our room without complication. The patient's nurse and physician were  informed IMPRESSION: Patient refused placement of Dobhoff tube. Electronically Signed   By: Marcello Moores  Register   On: 06/22/2015 15:44     ASSESSMENT AND PLAN:  69 y.o. female who presents with shortness of breath, worsening over the last 3-4 days. Patient states that she's also had some intermittent chest pain throughout the same  time. She states that she's had some episodes of acute CHF in the past, but denies being chronically followed for CHF  1. acute hypoxic respiratory failure secondary to Acute systolic CHF (congestive heart failure) and Aspiration PNA -Patient has been intubated and then extubated twice during the hospital course. Her respiratory status is tenuous given her high risk for aspiration. Clinically she does not appear to be in congestive heart failure presently, although difficult to evaluate clinically since patient's effort to breathe is poor. Repeat chest x-ray to rule out recurrent aspirations.  -She was diuresed with IV Lasix and responded well -Patient had a modified barium swallow  which was high risk for aspiration. Seen by GI and plan to place PEG tube on Monday.  Continue to wean off oxygen as tolerated, now on 2 L  2. Acute on Chronic RF (acute renal failure) with known history of chronic renal failure stage III  -Creatinine 1.2 , October 13, 1.08 today and close to baseline. Continue to monitor -Renal ultrasound bilateral kidney cysts.  3. Myasthenia gravis - appreciate Neuro input , now being continued on IVIG X 3 days and monitor.  - cont. Pyridostigmine  4.HTN (hypertension)-stable  -continue coreg, hydralazine, spironolactone  5.Type 2 diabetes mellitus-blood sugar stable and continue sliding scale insulin   6.COPD (chronic obstructive pulmonary disease) - continue duo nebs.    7.h/o chronic afib -Rate controlled and continue on coreg.  -High risk for falls and therefore not on anticoagulation.  8.Bipolar affective disorder, mixed - continue Lamictal  9. Right groin skin rash appears candidial-much improved -Continue Nystatin powder   10. Right upper extremity DVT-continue heparin drip. - will switch to Oral meds once PEG tube placed.      Appreciate Palliative care help and pt. Is a DNR now.  Discussed w/ Case Manager and pt. Could be a candidate for LTAC placement once  PEG tube is placed on Monday.    CODE STATUS:FULL  DVT Prophylaxis: Heparin gtt.   TOTAL TIME Spent in CARE OF THIS PATIENT: 35 minutes.  Coordination of care. 11 minutes  Mackenzie Rose M.D on 06/24/2015 at 9:03 AM  Between 7am to 6pm - Pager - [phone]  After 6pm go to [email or web address] - password [email or web address] [facility]  Office  [phone]  CC: Primary care physician; Donato Schultz, MD

## 2015-06-24 NOTE — Progress Notes (Signed)
Per Dr Michell Heinrich ok to have med with sherbert

## 2015-06-24 NOTE — Plan of Care (Signed)
Problem: Discharge Progression Outcomes Goal: Other Discharge Outcomes/Goals Outcome: Progressing Patient had no c/o pain this shift VSS Sister at bedside  Patient is tolerating pills well, take with sherbert per MD, otherwise patient continues as NPO Possible PEG placement tomorrow

## 2015-06-24 NOTE — Progress Notes (Signed)
Please have Md call sister Cyndra Numbers  [phone])  Prior to possible PEG placement tomorrow

## 2015-06-24 NOTE — Plan of Care (Signed)
Problem: Discharge Progression Outcomes Goal: Other Discharge Outcomes/Goals Outcome: Progressing Plan of care progress to goal: Discharge plan- the patient will discharge to [facility] at time of discharge.  Pain- no c/o pain noted this shift.  Hemodynamically: afebrile this shift, VSS. Heparin gtt continues at 10.5 per order, D5 infusing at 7ml/hr per order.  Diet: npo except for ice chips and medications whole with applesause, waiitng PEG placement. No c/o nausea or vomiting.  Activity appropriate for discharge: pt is able to follow commands, very weak, incontinence of bowel and bladder.

## 2015-06-25 ENCOUNTER — Inpatient Hospital Stay: Payer: Medicare Other

## 2015-06-25 ENCOUNTER — Inpatient Hospital Stay: Payer: Medicare Other | Admitting: Anesthesiology

## 2015-06-25 ENCOUNTER — Encounter: Payer: Self-pay | Admitting: Anesthesiology

## 2015-06-25 ENCOUNTER — Encounter
Admission: EM | Disposition: A | Discharge status: Skilled Nursing Facility | Payer: Self-pay | Source: Home / Self Care | Attending: Internal Medicine

## 2015-06-25 HISTORY — PX: PEG PLACEMENT: SHX5437

## 2015-06-25 HISTORY — PX: ESOPHAGOGASTRODUODENOSCOPY (EGD) WITH PROPOFOL: SHX5813

## 2015-06-25 LAB — GLUCOSE, CAPILLARY
GLUCOSE-CAPILLARY: 93 mg/dL (ref 65–99)
GLUCOSE-CAPILLARY: 104 mg/dL — AB (ref 65–99)
Glucose-Capillary: 114 mg/dL — ABNORMAL HIGH (ref 65–99)
Glucose-Capillary: 107 mg/dL — ABNORMAL HIGH (ref 65–99)
Glucose-Capillary: 112 mg/dL — ABNORMAL HIGH (ref 65–99)

## 2015-06-25 LAB — CBC
HEMATOCRIT: 30.1 % — AB (ref 35.0–47.0)
HEMOGLOBIN: 9.4 g/dL — AB (ref 12.0–16.0)
MCH: 26 pg (ref 26.0–34.0)
MCHC: 31.2 g/dL — ABNORMAL LOW (ref 32.0–36.0)
MCV: 83.4 fL (ref 80.0–100.0)
Platelets: 207 10*3/uL (ref 150–440)
RBC: 3.61 MIL/uL — ABNORMAL LOW (ref 3.80–5.20)
RDW: 26.7 % — ABNORMAL HIGH (ref 11.5–14.5)
WBC: 6 10*3/uL (ref 3.6–11.0)

## 2015-06-25 LAB — HEPARIN LEVEL (UNFRACTIONATED): HEPARIN UNFRACTIONATED: 0.73 [IU]/mL — AB (ref 0.30–0.70)

## 2015-06-25 SURGERY — ESOPHAGOGASTRODUODENOSCOPY (EGD) WITH PROPOFOL
Anesthesia: General

## 2015-06-25 MED ORDER — FENTANYL CITRATE (PF) 100 MCG/2ML IJ SOLN: 25.0000 ug | INTRAMUSCULAR | Status: DC | PRN

## 2015-06-25 MED ORDER — VANCOMYCIN HCL IN DEXTROSE 1-5 GM/200ML-% IV SOLN
1000.0000 mg | Freq: Once | INTRAVENOUS | Status: AC
  Administered 2015-06-25: 12:00:00 1000 mg via INTRAVENOUS
  Filled 2015-06-25: qty 200

## 2015-06-25 MED ORDER — ONDANSETRON HCL 4 MG/2ML IJ SOLN
INTRAMUSCULAR | Status: AC
  Filled 2015-06-25: qty 2

## 2015-06-25 MED ORDER — SODIUM CHLORIDE 0.9 % IV SOLN: INTRAVENOUS | Status: DC

## 2015-06-25 MED ORDER — IPRATROPIUM-ALBUTEROL 0.5-2.5 (3) MG/3ML IN SOLN
3.0000 mL | Freq: Four times a day (QID) | RESPIRATORY_TRACT | Status: DC
  Administered 2015-06-25 – 2015-06-27 (×8): 3 mL via RESPIRATORY_TRACT
  Filled 2015-06-25 (×8): qty 3

## 2015-06-25 MED ORDER — SODIUM CHLORIDE 0.9 % IV SOLN
INTRAVENOUS | Status: DC
  Administered 2015-06-25: 16:00:00 via INTRAVENOUS

## 2015-06-25 MED ORDER — ONDANSETRON HCL 4 MG/2ML IJ SOLN
4.0000 mg | Freq: Once | INTRAMUSCULAR | Status: AC | PRN
  Administered 2015-06-25: 17:00:00 via INTRAVENOUS

## 2015-06-25 MED ORDER — PROPOFOL 500 MG/50ML IV EMUL
INTRAVENOUS | Status: DC | PRN
  Administered 2015-06-25: 100 ug/kg/min via INTRAVENOUS

## 2015-06-25 MED ORDER — PROPOFOL 10 MG/ML IV BOLUS
INTRAVENOUS | Status: DC | PRN
Start: 2015-06-25 — End: 2015-06-25
  Administered 2015-06-25: 30 mg via INTRAVENOUS

## 2015-06-25 MED ORDER — SODIUM CHLORIDE 0.9 % IJ SOLN
10.0000 mL | Freq: Every day | INTRAMUSCULAR | Status: DC
  Administered 2015-06-26 – 2015-06-27 (×2): 10 mL via INTRAVENOUS

## 2015-06-25 MED ORDER — SODIUM CHLORIDE 0.9 % IJ SOLN
10.0000 mL | INTRAMUSCULAR | Status: DC | PRN
  Administered 2015-06-25: 10 mL via INTRAVENOUS
  Filled 2015-06-25: qty 10

## 2015-06-25 MED ORDER — HEPARIN (PORCINE) IN NACL 100-0.45 UNIT/ML-% IJ SOLN
1000.0000 [IU]/h | INTRAMUSCULAR | Status: DC
  Administered 2015-06-26 – 2015-06-27 (×2): 1000 [IU]/h via INTRAVENOUS
  Filled 2015-06-25 (×3): qty 250

## 2015-06-25 MED ORDER — ONDANSETRON HCL 4 MG/2ML IJ SOLN
4.0000 mg | Freq: Once | INTRAMUSCULAR | Status: AC
  Administered 2015-06-25: 4 mg via INTRAVENOUS

## 2015-06-25 MED ORDER — LIDOCAINE HCL (CARDIAC) 20 MG/ML IV SOLN
INTRAVENOUS | Status: DC | PRN
  Administered 2015-06-25: 100 mg via INTRAVENOUS

## 2015-06-25 NOTE — Progress Notes (Signed)
NIF -23cmH20

## 2015-06-25 NOTE — Interval H&P Note (Signed)
History and Physical Interval Note:  06/25/2015 3:42 PM  Mackenzie Rose  has presented today for surgery, with the diagnosis of dysphagia  The various methods of treatment have been discussed with the patient and family. After consideration of risks, benefits and other options for treatment, the patient has consented to  Procedure(s): ESOPHAGOGASTRODUODENOSCOPY (EGD) WITH PROPOFOL (N/A) PERCUTANEOUS ENDOSCOPIC GASTROSTOMY (PEG) PLACEMENT (N/A) as a surgical intervention .  The patient's history has been reviewed, patient examined, no change in status, stable for surgery.  I have reviewed the patient's chart and labs.  Questions were answered to the patient's satisfaction.    I have consented both the son Laurey Arrow and sister Perrin Smack.    Jewelene Mairena GORDON

## 2015-06-25 NOTE — Brief Op Note (Signed)
PEG clamped.  Gauze Dressing Dry & Intact.

## 2015-06-25 NOTE — Plan of Care (Signed)
Problem: Discharge Progression Outcomes Goal: Other Discharge Outcomes/Goals Outcome: Progressing Patient had no c/o pain this shift  NPO except beta blocker for PEG tube placement  Daughter at beside  Patient tolerated PEG placement well  Tube feeds to start on 06/26/15 Heparin drip to restart on 10/18  Possible discharge to [facility]

## 2015-06-25 NOTE — Plan of Care (Signed)
Problem: Discharge Progression Outcomes Goal: Other Discharge Outcomes/Goals Outcome: Progressing Plan of care progress to goal: Discharge plan- the patient will discharge to [facility] at time of discharge.  Pain- no c/o pain noted this shift.   Hemodynamically: afebrile this shift, VSS. Heparin gtt continues at 10.5 per order, D5 infusing at 57ml/hr per order, q4hours FSBS stable.Dressing to sacral  And right gluteal fold dry and intact.   Diet: npo except for ice chips and medications whole with orange sherbert. Pt strictly NPO since midnight for scheduled PEG placement. No c/o nausea or vomiting this shift.  Activity appropriate for discharge: pt is able to follow commands, very weak, incontinent of bowel and bladder.  Consent for PEG placement has not been signed. Per chart-pt is not able to make decisions for herself and her son Debarah Crape needs to be called concerning decisions to be made in reference to the patient.

## 2015-06-25 NOTE — Op Note (Addendum)
[facility] Gastroenterology Patient Name: Mackenzie Rose Procedure Date: 06/25/2015 3:44 PM MRN: [id number] Account #: [id number] Date of Birth: Sep 12, 1945 Admit Type: Outpatient Age: 69 Room: [facility] ENDO ROOM 1 Gender: Female Note Status: Finalized Procedure:         Upper GI endoscopy Indications:       Place PEG due to impaired swallowing, Place PEG due to                     aspiration risk Patient Profile:   This is a 69 year old female. Providers:         Gerrit Heck. Rayann Heman, MD Referring MD:      Cordelia Pen. Faheem (Referring MD) Medicines:         Propofol per Anesthesia Complications:     No immediate complications. Procedure:         Pre-Anesthesia Assessment:                    - Prior to the procedure, a History and Physical was                     performed, and patient medications, allergies and                     sensitivities were reviewed. The patient's tolerance of                     previous anesthesia was reviewed.                    After obtaining informed consent, the endoscope was passed                     under direct vision. Throughout the procedure, the                     patient's blood pressure, pulse, and oxygen saturations                     were monitored continuously. The Endoscope was introduced                     through the mouth, and advanced to the second part of                     duodenum. The upper GI endoscopy was accomplished without                     difficulty. The patient tolerated the procedure well. Findings:      A large hiatus hernia was present.      The esophagus was otherwise normal.      Mild inflammation characterized by erythema was found in the gastric       body.      Localized mildly erythematous mucosa was found in the duodenal bulb.      The patient was placed in the supine position for PEG placement. The       stomach was insufflated to appose gastric and abdominal walls. A site       was located  in the body of the stomach with excellent transillumination       and manual external pressure for placement. The abdominal wall was       marked and prepped in a sterile manner. The area was anesthetized with  4       mL of 1% lidocaine. The trocar needle was introduced through the       abdominal wall and into the stomach under direct endoscopic view. A       snare was introduced through the endoscope and opened in the gastric       lumen. The guide wire was passed through the trocar and into the open       snare. The snare was closed around the guide wire. The endoscope and       snare were removed, pulling the wire out through the mouth. A skin       incision was made at the site of needle insertion. The externally       removable 20 Fr EndoVive low-profile gastrostomy tube was lubricated.       The G-tube was tied to the guide wire and pulled through the mouth and       into the stomach. The trocar needle was removed, and the gastrostomy       tube was pulled out from the stomach through the skin. The external       bumper was attached to the gastrostomy tube, and the tube was cut to       remove the guide wire. The final position of the gastrostomy tube was       confirmed by skin marking noted to be 3 cm at the external bumper. The       final tension and compression of the abdominal wall by the PEG tube and       external bumper were checked and revealed that the bumper was loose and       lightly touching the skin. The feeding tube was capped, and the tube       site cleaned and dressed. Impression:        - Large hiatus hernia.                    - Normal esophagus.                    - Gastritis.                    - Erythematous duodenopathy.                    - An externally removable PEG placement was successfully                     completed.                    - No specimens collected. Recommendation:    - Observe patient in GI recovery unit.                    -  Continue present medications.                    - Hold heparin drip until tomorrow am if able medically,                     otherwise hold for at least 4 hours if medically possible.                    - The findings and recommendations were discussed with the  patient.                    - The findings and recommendations were discussed with the                     patient's family.                    - Please follow the post-PEG recommendations including:                     Nutrition consult for formula and volume, antibiotic                     ointment to site, change dressing once per day, check site                     for bleeding q 4 hrs, clean site with soap and water daily                     and dry thoroughly, NPO x4 hrs then water today, may use                     PEG today for meds and water and may use PEG tomorrow for                     feedings. Procedure Code(s): --- Professional ---                    [phone], Esophagogastroduodenoscopy, flexible, transoral;                     with directed placement of percutaneous gastrostomy tube CPT copyright 2014 [employer]. All rights reserved. The codes documented in this report are preliminary and upon coder review may  be revised to meet current compliance requirements. Mellody Life, MD 06/25/2015 4:26:42 PM This report has been signed electronically. Number of Addenda: 0 Note Initiated On: 06/25/2015 3:44 PM      [facility]

## 2015-06-25 NOTE — Anesthesia Preprocedure Evaluation (Addendum)
Anesthesia Evaluation  Patient identified by MRN, date of birth, ID band Patient awake    Reviewed: Allergy & Precautions, NPO status , Patient's Chart, lab work & pertinent test results, reviewed documented beta blocker date and time   Airway Mallampati: II  TM Distance: >3 FB     Dental  (+) Upper Dentures, Lower Dentures   Pulmonary shortness of breath and with exertion, COPD,  COPD inhaler, Current Smoker,   Significant rhonchi with some clearance with coughing  + rhonchi        Cardiovascular hypertension, Pt. on medications and Pt. on home beta blockers Normal cardiovascular exam+ dysrhythmias Atrial Fibrillation      Neuro/Psych  Headaches, PSYCHIATRIC DISORDERS Anxiety Depression Bipolar Disorder Myasthenia gravis  Neuromuscular disease    GI/Hepatic Neg liver ROS, Needs peg    Endo/Other  diabetes, Well Controlled, Type 2, Oral Hypoglycemic Agents  Renal/GU Renal InsufficiencyRenal diseaseHx of acute renal failure  negative genitourinary   Musculoskeletal negative musculoskeletal ROS (+) Myasthenia gravis   Abdominal Normal abdominal exam  (+)   Peds negative pediatric ROS (+)  Hematology  (+) anemia ,   Anesthesia Other Findings Myasthenia gravis  Reproductive/Obstetrics                          Anesthesia Physical Anesthesia Plan  ASA: IV  Anesthesia Plan: General   Post-op Pain Management:    Induction: Intravenous  Airway Management Planned: Nasal Cannula  Additional Equipment:   Intra-op Plan:   Post-operative Plan:   Informed Consent:   Dental advisory given  Plan Discussed with: CRNA and Surgeon  Anesthesia Plan Comments:         Anesthesia Quick Evaluation

## 2015-06-25 NOTE — H&P (View-Only) (Signed)
GI Inpatient Consult Note  Reason for Consult: dysphagia, high aspiration risk   Attending Requesting Consult: Dr Verdell Carmine  History of Present Illness: Mackenzie Rose is a 69 y.o. female  With a complicated past medical history including COPD, dementia, m gravis,  Who has had a complicated hospitalization including acute respiratory failure thought secondary to CHF and aspiration pneumonia new GI is consulted on for possible placement of a PEG tube.   The patient had a speech and swallow study today.  She had some aspiration during that study and was deemed very high aspiration risk with p.o. Intake.     Currently is done is quite sleepy and does not have any specific complaints.  She indicates that she would be willing to undergo feeding tube placement but would like the decision to be run by her son.   I have not yet spoken to her son upper the patient Care notes he would be in agreement with attempted a PEG tube.    Of note during this hospitalization, she was diagnosed with an upper extremity DVT and is currently on a heparin infusion.    She does have evidence of a hiatal hernia on both her previous CT and her upper endoscopy from September.   However it appears mostly gastric body is below the diaphragm and therefore  Transillumination may still be possible  In order to place a feeding tube through the abdominal wall into the gastric body.  Past Medical History:  Past Medical History  Diagnosis Date  . Heart disease   . Atrial fibrillation ([place])     a. reported a-fib; b. unknown chronicity; c. dates back to 1970s; d. not on long term anticoagulation 2/2 no documented a-fib  . Hypertension   . High cholesterol   . Diabetes ([place])     borderline  . COPD (chronic obstructive pulmonary disease) ([place])   . Melanoma ([place])   . Kidney disorder   . Tremor   . Autoimmune disease ([place])   . Depressed   . Anxiety disorder   . Migraine   . Dementia   . Motion sickness   . Myasthenia gravis  ([place])   . Bipolar disorder ([place])   . Dementia   . Acute renal failure ([place])   . Near syncope     Problem List: Patient Active Problem List   Diagnosis Date Noted  . Pressure ulcer 06/16/2015  . Encounter for intubation   . SOB (shortness of breath)   . Dyspnea   . Acute systolic CHF (congestive heart failure) 06/09/2015  . Blood in stool   . Anemia 05/21/2015  . Acute GI bleeding   . Symptomatic anemia   . Syncope 05/17/2015  . Near syncope 03/25/2015  . ARF (acute renal failure) ([place]) 03/25/2015  . COPD (chronic obstructive pulmonary disease) ([place]) 03/25/2015  . Anxiety 12/12/2014  . Bipolar affective disorder, mixed ([place]) 12/12/2014  . Renal insufficiency syndrome 12/12/2014  . HTN (hypertension) 12/12/2014  . Type 2 diabetes mellitus ([place]) 12/12/2014  . CAFL (chronic airflow limitation) ([place]) 12/12/2014  . Bipolar disorder ([place])   . Dementia   . Ulnar neuropathy at elbow of right upper extremity 09/22/2014  . Myasthenia gravis ([place]) 11/22/2013    Past Surgical History: Past Surgical History  Procedure Laterality Date  . Tubal ligation    . Retena repair    . Cataract extraction    . Cholecystectomy    . Esophagogastroduodenoscopy (egd) with propofol N/A 05/22/2015    Procedure: ESOPHAGOGASTRODUODENOSCOPY (  EGD) WITH PROPOFOL;  Surgeon: Lucilla Lame, MD;  Location[facility] ENDOSCOPY;  Service: Endoscopy;  Laterality: N/A;    Allergies: Allergies  Allergen Reactions  . Lactose Intolerance (Gi) Other (See Comments)    Reaction:  GI upset   . Lithium Other (See Comments)    Reaction:  Unknown   . Penicillins Rash    Patient tolerated Ampicillin-Sulbactam     Home Medications: Prescriptions prior to admission  Medication Sig Dispense Refill Last Dose  . acetaminophen (TYLENOL) 650 MG CR tablet Take 650 mg by mouth daily. Pt takes daily at 2pm and every four hours as needed for pain.   Taking  . albuterol (PROVENTIL HFA;VENTOLIN HFA) 108 (90 BASE) MCG/ACT inhaler  Inhale 2 puffs into the lungs every 4 (four) hours as needed for wheezing or shortness of breath. 6.7 g 1 Taking  . bismuth subsalicylate (PEPTO BISMOL) 262 MG/15ML suspension Take 5 mLs by mouth 4 (four) times daily as needed for diarrhea or loose stools (or nausea).   Taking  . busPIRone (BUSPAR) 10 MG tablet Take 10 mg by mouth 2 (two) times daily.    Taking  . Dextromethorphan-Benzocaine (SORE THROAT & COUGH LOZENGES MT) Use as directed 1 lozenge in the mouth or throat 4 (four) times daily as needed (for cough).   Taking  . diclofenac sodium (VOLTAREN) 1 % GEL Apply 2 g topically 2 (two) times daily.    Taking  . diltiazem (CARDIZEM CD) 180 MG 24 hr capsule Take 180 mg by mouth daily.    Taking  . donepezil (ARICEPT) 10 MG tablet Take 1 tablet (10 mg total) by mouth daily. 30 tablet 3 Taking  . doxycycline (VIBRAMYCIN) 50 MG capsule Take 1 capsule by mouth 2 (two) times daily.     . ferrous sulfate 325 (65 FE) MG tablet Take 1 tablet (325 mg total) by mouth 2 (two) times daily with a meal. 60 tablet 3 Taking  . Fluticasone-Salmeterol (ADVAIR) 250-50 MCG/DOSE AEPB Inhale 1 puff into the lungs 2 (two) times daily.   Taking  . glimepiride (AMARYL) 1 MG tablet Take 1 tablet by mouth 2 (two) times daily.     Marland Kitchen guaifenesin (ROBITUSSIN) 100 MG/5ML syrup Take 100 mg by mouth 2 (two) times daily as needed for cough.   Taking  . hydrALAZINE (APRESOLINE) 50 MG tablet Take 1 tablet (50 mg total) by mouth 3 (three) times daily. 90 tablet 0 Taking  . lamoTRIgine (LAMICTAL) 100 MG tablet Take 100 mg by mouth 2 (two) times daily.   Taking  . latanoprost (XALATAN) 0.005 % ophthalmic solution Place 1 drop into both eyes at bedtime.    Taking  . nicotine (NICODERM CQ - DOSED IN MG/24 HR) 7 mg/24hr patch Place 1 patch (7 mg total) onto the skin daily. 15 patch 0 Taking  . ondansetron (ZOFRAN) 4 MG tablet Take 1 tablet (4 mg total) by mouth every 8 (eight) hours as needed for nausea or vomiting. 20 tablet 0 Taking   . pantoprazole (PROTONIX) 40 MG tablet Take 1 tablet (40 mg total) by mouth 2 (two) times daily before a meal. 60 tablet 2 Taking  . phenylephrine-shark liver oil-mineral oil-petrolatum (PREPARATION H) 0.25-3-14-71.9 % rectal ointment Place 1 application rectally 2 (two) times daily as needed for hemorrhoids.   Taking  . polyethylene glycol (MIRALAX / GLYCOLAX) packet Take 17 g by mouth daily as needed for mild constipation.   Taking  . potassium chloride SA (K-DUR,KLOR-CON) 20 MEQ tablet Take 20  mEq by mouth daily.   Taking  . predniSONE (DELTASONE) 10 MG tablet Take 30 mg by mouth every other day.   Taking  . pyridostigmine (MESTINON) 60 MG tablet Take 60 mg by mouth 3 (three) times daily.    Taking  . QUEtiapine (SEROQUEL) 200 MG tablet Take 1 tablet (200 mg total) by mouth at bedtime. 30 tablet 3 Taking  . sodium chloride (OCEAN) 0.65 % SOLN nasal spray Place 2 sprays into both nostrils daily as needed for congestion.    Taking  . traMADol (ULTRAM) 50 MG tablet Take 50 mg by mouth 2 (two) times daily as needed.   Taking  . traZODone (DESYREL) 50 MG tablet Take 1 tablet (50 mg total) by mouth at bedtime. 30 tablet 0 Taking   Home medication reconciliation was not completed with the patient.   Scheduled Inpatient Medications:   . antiseptic oral rinse  7 mL Mouth Rinse QID  . carvedilol  3.125 mg Per Tube BID WC  . chlorhexidine gluconate  15 mL Mouth Rinse BID  . insulin aspart  0-9 Units Subcutaneous 6 times per day  . lamoTRIgine  100 mg Oral BID  . latanoprost  1 drop Both Eyes QHS  . nitroGLYCERIN  1 inch Topical 3 times per day  . nystatin   Topical TID  . pantoprazole (PROTONIX) IV  40 mg Intravenous Q24H  . pyridostigmine  10 mg Intravenous TID  . spironolactone  25 mg Oral Daily    Continuous Inpatient Infusions:   . dextrose 50 mL/hr at 06/21/15 0705  . heparin 1,050 Units/hr (06/20/15 2228)    PRN Inpatient Medications:  albuterol, bisacodyl, ipratropium-albuterol,  LORazepam, metoprolol, [DISCONTINUED] ondansetron **OR** ondansetron (ZOFRAN) IV  Family History: family history includes Breast cancer in her sister; Diabetes Mellitus II in her sister; Heart disease in her father and sister; Other in her mother.    Social History:   reports that she has been smoking Cigarettes.  She started smoking about 50 years ago. She has a 100 pack-year smoking history. She has never used smokeless tobacco. She reports that she does not drink alcohol or use illicit drugs.   Review of Systems: Unable to obtain.    Physical Examination: BP 120/56 mmHg  Pulse 77  Temp(Src) 98.5 F (36.9 C) (Oral)  Resp 18  Ht 5\' 4"  (1.626 m)  Wt 58.877 kg (129 lb 12.8 oz)  BMI 22.27 kg/m2  SpO2 90% Gen: NAD, alert and oriented x 2, somnolent HEENT: PEERLA, EOMI, Neck: supple, no JVD or thyromegaly Chest: CTA bilaterally, no wheezes, crackles, or other adventitious sounds CV: RRR, no m/g/c/r Abd: +mild diffuse TTP ND, +BS in all four quadrants; no HSM, guarding, ridigity, or rebound tenderness Ext: +mild edema bilat, well perfused with 2+ pulses, Skin: no rash or lesions noted Lymph: no LAD  Data: Lab Results  Component Value Date   WBC 12.7* 06/21/2015   HGB 10.1* 06/21/2015   HCT 33.2* 06/21/2015   MCV 83.1 06/21/2015   PLT 267 06/21/2015    Recent Labs Lab 06/19/15 0119 06/20/15 0443 06/21/15 0510  HGB 11.0* 9.9* 10.1*   Lab Results  Component Value Date   NA 149* 06/20/2015   K 3.4* 06/20/2015   CL 112* 06/20/2015   CO2 30 06/20/2015   BUN 60* 06/20/2015   CREATININE 1.51* 06/20/2015   Lab Results  Component Value Date   ALT 18 06/08/2015   AST 26 06/08/2015   ALKPHOS 114 06/08/2015   BILITOT  0.7 06/08/2015    Recent Labs Lab 06/16/15 1710  APTT 32  INR 1.08   Assessment/Plan: Ms. Schirm is a 69 y.o. female with complicated past medical history and complicated hospitalization who is high risk for aspiration and would not be able to safely  take in p.o. Intake by mouth.   She is high risk for anesthesia given her many comorbidities including COPD, CHF, myasthenia gravis  But placement of the feeding tube is necessary in this situation and outweighs the risks.   She also has a hiatal hernia the based on the CT and a previous endoscopy there may be enough gastric body below the diaphragm to obtain good transillumination.  Recommendations:  - possible PEG placement tomorrow.  - hold heparin drip starting at 6 am.  - npo mn - will speak to son regarding consent   Thank you for the consult. Please call with questions or concerns.  Bravery Ketcham, Grace Blight, MD

## 2015-06-25 NOTE — Progress Notes (Addendum)
PEG tube placed today.   Recs:  - hold heparin drip until am if able medically, otherwise hold heparin drip at least 4 hours post PEG placement.  - start water and meds through tube in 4 hours, start tube feeds in am - nutrition consult.    Addendum:  Discussed with Dr Ether Griffins. Will restart heparin drip in 8 hours with no bolus.  Weighing risk of bleeding post PEG vs worsening of DVT or resulting PE off heparin.

## 2015-06-25 NOTE — Care Management Important Message (Signed)
Important Message  Patient Details  Name: Mackenzie Rose MRN: [id number] Date of Birth: 05/11/1946   Medicare Important Message Given:  Yes-third notification given    Shelbie Ammons, RN 06/25/2015, 12:01 PM

## 2015-06-25 NOTE — Progress Notes (Signed)
ANTICOAGULATION CONSULT NOTE - Follow Up Consult  Pharmacy Consult for Heparin Indication: DVT  Allergies  Allergen Reactions  . Lactose Intolerance (Gi) Other (See Comments)    Reaction:  GI upset   . Lithium Other (See Comments)    Reaction:  Unknown   . Penicillins Rash    Patient tolerated Ampicillin-Sulbactam     Patient Measurements: Height: 5\' 4"  (162.6 cm) Weight: 137 lb 14.4 oz (62.551 kg) IBW/kg (Calculated) : 54.7 Heparin Dosing Weight:   Vital Signs: Temp: 98.3 F (36.8 C) (10/17 1737) Temp Source: Oral (10/17 1737) BP: 153/81 mmHg (10/17 1737) Pulse Rate: 84 (10/17 1737)  Labs:  Recent Labs  06/23/15 0551 06/24/15 0500 06/25/15 0453  HGB  --  9.1* 9.4*  HCT  --  28.9* 30.1*  PLT  --  205 207  HEPARINUNFRC 0.61 0.68 0.73*  CREATININE  --  1.08*  --     Estimated Creatinine Clearance: 41.3 mL/min (by C-G formula based on Cr of 1.08).   Medications:  Prescriptions prior to admission  Medication Sig Dispense Refill Last Dose  . acetaminophen (TYLENOL) 650 MG CR tablet Take 650 mg by mouth daily. Pt takes daily at 2pm and every four hours as needed for pain.   Taking  . albuterol (PROVENTIL HFA;VENTOLIN HFA) 108 (90 BASE) MCG/ACT inhaler Inhale 2 puffs into the lungs every 4 (four) hours as needed for wheezing or shortness of breath. 6.7 g 1 Taking  . bismuth subsalicylate (PEPTO BISMOL) 262 MG/15ML suspension Take 5 mLs by mouth 4 (four) times daily as needed for diarrhea or loose stools (or nausea).   Taking  . busPIRone (BUSPAR) 10 MG tablet Take 10 mg by mouth 2 (two) times daily.    Taking  . Dextromethorphan-Benzocaine (SORE THROAT & COUGH LOZENGES MT) Use as directed 1 lozenge in the mouth or throat 4 (four) times daily as needed (for cough).   Taking  . diclofenac sodium (VOLTAREN) 1 % GEL Apply 2 g topically 2 (two) times daily.    Taking  . diltiazem (CARDIZEM CD) 180 MG 24 hr capsule Take 180 mg by mouth daily.    Taking  . donepezil (ARICEPT)  10 MG tablet Take 1 tablet (10 mg total) by mouth daily. 30 tablet 3 Taking  . doxycycline (VIBRAMYCIN) 50 MG capsule Take 1 capsule by mouth 2 (two) times daily.     . ferrous sulfate 325 (65 FE) MG tablet Take 1 tablet (325 mg total) by mouth 2 (two) times daily with a meal. 60 tablet 3 Taking  . Fluticasone-Salmeterol (ADVAIR) 250-50 MCG/DOSE AEPB Inhale 1 puff into the lungs 2 (two) times daily.   Taking  . glimepiride (AMARYL) 1 MG tablet Take 1 tablet by mouth 2 (two) times daily.     Marland Kitchen guaifenesin (ROBITUSSIN) 100 MG/5ML syrup Take 100 mg by mouth 2 (two) times daily as needed for cough.   Taking  . hydrALAZINE (APRESOLINE) 50 MG tablet Take 1 tablet (50 mg total) by mouth 3 (three) times daily. 90 tablet 0 Taking  . lamoTRIgine (LAMICTAL) 100 MG tablet Take 100 mg by mouth 2 (two) times daily.   Taking  . latanoprost (XALATAN) 0.005 % ophthalmic solution Place 1 drop into both eyes at bedtime.    Taking  . nicotine (NICODERM CQ - DOSED IN MG/24 HR) 7 mg/24hr patch Place 1 patch (7 mg total) onto the skin daily. 15 patch 0 Taking  . ondansetron (ZOFRAN) 4 MG tablet Take 1 tablet (4  mg total) by mouth every 8 (eight) hours as needed for nausea or vomiting. 20 tablet 0 Taking  . pantoprazole (PROTONIX) 40 MG tablet Take 1 tablet (40 mg total) by mouth 2 (two) times daily before a meal. 60 tablet 2 Taking  . phenylephrine-shark liver oil-mineral oil-petrolatum (PREPARATION H) 0.25-3-14-71.9 % rectal ointment Place 1 application rectally 2 (two) times daily as needed for hemorrhoids.   Taking  . polyethylene glycol (MIRALAX / GLYCOLAX) packet Take 17 g by mouth daily as needed for mild constipation.   Taking  . potassium chloride SA (K-DUR,KLOR-CON) 20 MEQ tablet Take 20 mEq by mouth daily.   Taking  . predniSONE (DELTASONE) 10 MG tablet Take 30 mg by mouth every other day.   Taking  . pyridostigmine (MESTINON) 60 MG tablet Take 60 mg by mouth 3 (three) times daily.    Taking  . QUEtiapine  (SEROQUEL) 200 MG tablet Take 1 tablet (200 mg total) by mouth at bedtime. 30 tablet 3 Taking  . sodium chloride (OCEAN) 0.65 % SOLN nasal spray Place 2 sprays into both nostrils daily as needed for congestion.    Taking  . traMADol (ULTRAM) 50 MG tablet Take 50 mg by mouth 2 (two) times daily as needed.   Taking  . traZODone (DESYREL) 50 MG tablet Take 1 tablet (50 mg total) by mouth at bedtime. 30 tablet 0 Taking    Assessment: Patient is a 69 yo female receiving Heparin drip for DVT in right upper extremity DVT.   HL at 0500 on 10/13: 0.42  Goal of Therapy:  Heparin level 0.3-0.7 units/ml Monitor platelets by anticoagulation protocol: Yes   Plan:  Will continue this pt on current rate of 1050 units/hr. Will recheck HL and CBC on 10/14 with AM labs.   1014 0450 heparin level therapeutic, continue current rate, will recheck with AM labs.   1015 AM heparin level therapeutic, continue current rate, will recheck with AM labs.   1016 AM heparin level therapeutic, continue current rate, will recheck with AM labs  1017 AM heparin level supratherapeutic. Reduce rate to 1000 units/hr and recheck in 8 hours.  1017:  Heparin gtt held for procedure.   Per Dr Clayton Bibles,  Will restart heparin on 10/18 @ 1:00, with no bolus.  Will need to f/u with RN to ensure restart and then order HL 8 hrs after restart.    Pharmacy will continue to follow.  Leydi Winstead D, Pharm.D.  Clinical Pharmacist 06/25/2015,5:41 PM

## 2015-06-25 NOTE — Progress Notes (Signed)
[facility] NAME: Mackenzie Rose    MR#:  [id number]  DATE OF BIRTH:  1945-12-16  SUBJECTIVE:   Remains somewhat lethargic and weak, but not confused.  Seen by GI and plan for doing PEG tube today.  Also seen by Neuro and he continued on IVIG for Myasthenia.  Feels comfortable. Denies shortness of breath.  Wants to eat     Review of Systems  Constitutional: Negative for fever and chills.  HENT: Negative for congestion and tinnitus.   Eyes: Negative for blurred vision and double vision.  Respiratory: Positive for cough and sputum production. Negative for shortness of breath and wheezing.   Cardiovascular: Negative for chest pain, orthopnea and PND.  Gastrointestinal: Negative for nausea, vomiting, abdominal pain and diarrhea.  Genitourinary: Negative for dysuria and hematuria.  Neurological: Positive for weakness (generalized). Negative for dizziness, sensory change and focal weakness.  All other systems reviewed and are negative.  Tolerating Diet: Evaluated by speech and high risk for aspiration and will keep nothing by mouth Tolerating PT: Evaluation noted.   DRUG ALLERGIES:   Allergies  Allergen Reactions  . Lactose Intolerance (Gi) Other (See Comments)    Reaction:  GI upset   . Lithium Other (See Comments)    Reaction:  Unknown   . Penicillins Rash    Patient tolerated Ampicillin-Sulbactam     VITALS:  Blood pressure 148/83, pulse 87, temperature 99.4 F (37.4 C), temperature source Oral, resp. rate 18, height 5\' 4"  (1.626 m), weight 62.551 kg (137 lb 14.4 oz), SpO2 93 %.  PHYSICAL EXAMINATION:   Physical Exam  GENERAL:  69 y.o.-year-old patient lying in the bed globally weak and lethargic  EYES: pupils are equally reactive to light and accommodation. No scleral icterus. HEENT: Head atraumatic, normocephalic. Oropharynx and nasopharynx clear.   NECK:  Supple, no jugular venous distention. No thyroid  enlargement, no tenderness.  LUNGS: Poor respiratory effort. Positive upper airway rhonchi, no rales, wheezes. No use of accessory muscles of respiration.  CARDIOVASCULAR: S1, S2 normal. No murmurs, rubs, or gallops.  ABDOMEN: Soft, nontender, nondistended. Bowel sounds present. No organomegaly or mass.  EXTREMITIES: No cyanosis, clubbing, +1 edema bilaterally.  Right upper ext. Edema and wamth much improved.  NEUROLOGIC: Globally weak but no other focal motor or sensory deficits bilaterally.   PSYCHIATRIC: alert, oriented x 3. Good affect  SKIN: No ulcer, rash, lesions  LABORATORY PANEL:   CBC  Recent Labs Lab 06/25/15 0453  WBC 6.0  HGB 9.4*  HCT 30.1*  PLT 207    Chemistries   Recent Labs Lab 06/24/15 0500  NA 144  K 3.4*  CL 111  CO2 28  GLUCOSE 105*  BUN 13  CREATININE 1.08*  CALCIUM 8.5*    Cardiac Enzymes No results for input(s): TROPONINI in the last 168 hours.  RADIOLOGY:  Dg Chest Port 1 View  06/25/2015  CLINICAL DATA:  Subsequent encounter for pneumonia EXAM: PORTABLE CHEST 1 VIEW COMPARISON:  06/19/2015. FINDINGS: Interval improvement in bibasilar aeration although basilar airspace disease does persist bilaterally. Right PICC line tip overlies the SVC/ RA junction. Left lung calcified granulomas stable. Cardiopericardial silhouette is at upper limits of normal for size. Bones are diffusely demineralized. Prominent skin fold overlies the lateral right hemi thorax. IMPRESSION: Interval improvement in basilar aeration.  Otherwise stable exam. Electronically Signed   By: Misty Stanley M.D.   On: 06/25/2015 07:30     ASSESSMENT AND PLAN:  69 y.o. female who presents with shortness of breath, worsening over the last 3-4 days. Patient states that she's also had some intermittent chest pain throughout the same time. She states that she's had some episodes of acute CHF in the past, but denies being chronically followed for CHF  1. acute hypoxic respiratory  failure secondary to Acute systolic CHF (congestive heart failure) and Aspiration PNA -Patient has been intubated and then extubated twice during the hospital course. Her respiratory status is tenuous given her high risk for aspiration. Clinically she does not appear to be in congestive heart failure presently, although clinically difficult to evaluate since patient's effort to breathe is poor. Chest x-ray revealed improvement in basilar aeration.  -She was diuresed with IV Lasix and responded well -Patient had a modified barium swallow  which was high risk for aspiration. Seen by GI and plan to place PEG tube on Monday.  Continue to wean off oxygen as tolerated, now on 2 L  2. Acute on Chronic RF (acute renal failure) with known history of chronic renal failure stage III  -Creatinine 1.2 , October 13, 1.08 - 16th of October and at baseline. Continue to monitor -Renal ultrasound bilateral kidney cysts.  3. Myasthenia gravis - appreciate Neuro input , now being continued on IVIG X 3 days and monitor.  - cont. Pyridostigmine  4.HTN (hypertension)-stable  -continue coreg, hydralazine, spironolactone. The pressure is relatively well controlled  5.Type 2 diabetes mellitus-blood sugar stable and continue sliding scale insulin   6.COPD (chronic obstructive pulmonary disease) - continue duo nebs.  Stable clinically  7.h/o chronic afib -Rate controlled and continue on coreg.  -High risk for falls and therefore not on anticoagulation.  8.Bipolar affective disorder, mixed - continue Lamictal  9. Right groin skin rash appears candidial-much improved -Continue Nystatin powder   10. Right upper extremity DVT-continue heparin drip. - will switch to Oral meds once PEG tube placed.      Appreciate Palliative care help and pt. Is a DNR now.  Discussed w/ Case Manager and pt. Could be a candidate for LTAC placement once PEG tube is placed on Monday.    CODE STATUS:FULL  DVT Prophylaxis: Heparin gtt.    TOTAL TIME Spent in CARE OF THIS PATIENT: 30 minutes.    Theodoro Grist M.D on 06/25/2015 at 9:03 AM  Between 7am to 6pm - Pager - [phone]  After 6pm go to [email or web address] - password [email or web address] [facility]  Office  [phone]  CC: Primary care physician; Donato Schultz, MD

## 2015-06-25 NOTE — Progress Notes (Signed)
ANTICOAGULATION CONSULT NOTE - Follow Up Consult  Pharmacy Consult for Heparin Indication: DVT  Allergies  Allergen Reactions  . Lactose Intolerance (Gi) Other (See Comments)    Reaction:  GI upset   . Lithium Other (See Comments)    Reaction:  Unknown   . Penicillins Rash    Patient tolerated Ampicillin-Sulbactam     Patient Measurements: Height: 5\' 4"  (162.6 cm) Weight: 137 lb 14.4 oz (62.551 kg) IBW/kg (Calculated) : 54.7 Heparin Dosing Weight:   Vital Signs: Temp: 98.1 F (36.7 C) (10/17 0513) Temp Source: Oral (10/17 0513) BP: 152/76 mmHg (10/17 0513) Pulse Rate: 80 (10/17 0513)  Labs:  Recent Labs  06/23/15 0551 06/24/15 0500 06/25/15 0453  HGB  --  9.1* 9.4*  HCT  --  28.9* 30.1*  PLT  --  205 207  HEPARINUNFRC 0.61 0.68 0.73*  CREATININE  --  1.08*  --     Estimated Creatinine Clearance: 41.3 mL/min (by C-G formula based on Cr of 1.08).   Medications:  Prescriptions prior to admission  Medication Sig Dispense Refill Last Dose  . acetaminophen (TYLENOL) 650 MG CR tablet Take 650 mg by mouth daily. Pt takes daily at 2pm and every four hours as needed for pain.   Taking  . albuterol (PROVENTIL HFA;VENTOLIN HFA) 108 (90 BASE) MCG/ACT inhaler Inhale 2 puffs into the lungs every 4 (four) hours as needed for wheezing or shortness of breath. 6.7 g 1 Taking  . bismuth subsalicylate (PEPTO BISMOL) 262 MG/15ML suspension Take 5 mLs by mouth 4 (four) times daily as needed for diarrhea or loose stools (or nausea).   Taking  . busPIRone (BUSPAR) 10 MG tablet Take 10 mg by mouth 2 (two) times daily.    Taking  . Dextromethorphan-Benzocaine (SORE THROAT & COUGH LOZENGES MT) Use as directed 1 lozenge in the mouth or throat 4 (four) times daily as needed (for cough).   Taking  . diclofenac sodium (VOLTAREN) 1 % GEL Apply 2 g topically 2 (two) times daily.    Taking  . diltiazem (CARDIZEM CD) 180 MG 24 hr capsule Take 180 mg by mouth daily.    Taking  . donepezil (ARICEPT)  10 MG tablet Take 1 tablet (10 mg total) by mouth daily. 30 tablet 3 Taking  . doxycycline (VIBRAMYCIN) 50 MG capsule Take 1 capsule by mouth 2 (two) times daily.     . ferrous sulfate 325 (65 FE) MG tablet Take 1 tablet (325 mg total) by mouth 2 (two) times daily with a meal. 60 tablet 3 Taking  . Fluticasone-Salmeterol (ADVAIR) 250-50 MCG/DOSE AEPB Inhale 1 puff into the lungs 2 (two) times daily.   Taking  . glimepiride (AMARYL) 1 MG tablet Take 1 tablet by mouth 2 (two) times daily.     Marland Kitchen guaifenesin (ROBITUSSIN) 100 MG/5ML syrup Take 100 mg by mouth 2 (two) times daily as needed for cough.   Taking  . hydrALAZINE (APRESOLINE) 50 MG tablet Take 1 tablet (50 mg total) by mouth 3 (three) times daily. 90 tablet 0 Taking  . lamoTRIgine (LAMICTAL) 100 MG tablet Take 100 mg by mouth 2 (two) times daily.   Taking  . latanoprost (XALATAN) 0.005 % ophthalmic solution Place 1 drop into both eyes at bedtime.    Taking  . nicotine (NICODERM CQ - DOSED IN MG/24 HR) 7 mg/24hr patch Place 1 patch (7 mg total) onto the skin daily. 15 patch 0 Taking  . ondansetron (ZOFRAN) 4 MG tablet Take 1 tablet (4  mg total) by mouth every 8 (eight) hours as needed for nausea or vomiting. 20 tablet 0 Taking  . pantoprazole (PROTONIX) 40 MG tablet Take 1 tablet (40 mg total) by mouth 2 (two) times daily before a meal. 60 tablet 2 Taking  . phenylephrine-shark liver oil-mineral oil-petrolatum (PREPARATION H) 0.25-3-14-71.9 % rectal ointment Place 1 application rectally 2 (two) times daily as needed for hemorrhoids.   Taking  . polyethylene glycol (MIRALAX / GLYCOLAX) packet Take 17 g by mouth daily as needed for mild constipation.   Taking  . potassium chloride SA (K-DUR,KLOR-CON) 20 MEQ tablet Take 20 mEq by mouth daily.   Taking  . predniSONE (DELTASONE) 10 MG tablet Take 30 mg by mouth every other day.   Taking  . pyridostigmine (MESTINON) 60 MG tablet Take 60 mg by mouth 3 (three) times daily.    Taking  . QUEtiapine  (SEROQUEL) 200 MG tablet Take 1 tablet (200 mg total) by mouth at bedtime. 30 tablet 3 Taking  . sodium chloride (OCEAN) 0.65 % SOLN nasal spray Place 2 sprays into both nostrils daily as needed for congestion.    Taking  . traMADol (ULTRAM) 50 MG tablet Take 50 mg by mouth 2 (two) times daily as needed.   Taking  . traZODone (DESYREL) 50 MG tablet Take 1 tablet (50 mg total) by mouth at bedtime. 30 tablet 0 Taking    Assessment: Patient is a 69 yo female receiving Heparin drip for DVT in right upper extremity DVT.   HL at 0500 on 10/13: 0.42  Goal of Therapy:  Heparin level 0.3-0.7 units/ml Monitor platelets by anticoagulation protocol: Yes   Plan:  Will continue this pt on current rate of 1050 units/hr. Will recheck HL and CBC on 10/14 with AM labs.   1014 0450 heparin level therapeutic, continue current rate, will recheck with AM labs.   1015 AM heparin level therapeutic, continue current rate, will recheck with AM labs.   1016 AM heparin level therapeutic, continue current rate, will recheck with AM labs  1017 AM heparin level supratherapeutic. Reduce rate to 1000 units/hr and recheck in 8 hours.  Pharmacy will continue to follow.  Laural Benes, Pharm.D.  Clinical Pharmacist 06/25/2015,6:35 AM

## 2015-06-25 NOTE — Transfer of Care (Signed)
Immediate Anesthesia Transfer of Care Note  Patient: Venetia Constable  Procedure(s) Performed: Procedure(s): ESOPHAGOGASTRODUODENOSCOPY (EGD) WITH PROPOFOL (N/A) PERCUTANEOUS ENDOSCOPIC GASTROSTOMY (PEG) PLACEMENT (N/A)  Patient Location: PACU  Anesthesia Type:General  Level of Consciousness: awake  Airway & Oxygen Therapy: Patient Spontanous Breathing and Patient connected to nasal cannula oxygen  Post-op Assessment: Report given to RN and Post -op Vital signs reviewed and stable  Post vital signs: stable  Last Vitals:  Filed Vitals:   06/25/15 1629  BP: 145/102  Pulse: 92  Temp: 36.1 C  Resp: 12    Complications: No apparent anesthesia complications

## 2015-06-25 NOTE — Progress Notes (Signed)
NIFM -16cm

## 2015-06-25 NOTE — Progress Notes (Signed)
Nutrition Follow-up   INTERVENTION:   Coordination of Care: RN Gerald Stabs reports pt scheduled for PEG placement today around 3:30pm. Once placed and MD approval to use for feedings, will recommend Jevity 1.2 continuous feeding with a goal rate of 23mL/hr to provide a total of 1872kcals and 87g protein, 1246mL of free water with 150-278mL TID of free water additional to meet hydration needs once off IVF. Will follow poc and make recommendations accordingly.    NUTRITION DIAGNOSIS:   Inadequate oral intake related to acute illness as evidenced by NPO status.  GOAL:   Patient will meet greater than or equal to 90% of their needs; ongoing  MONITOR:    (Energy intake, Pulmonary profile, Digestive system)  REASON FOR ASSESSMENT:   Consult Enteral/tube feeding initiation and management  ASSESSMENT:   Per MD note, pt remains somewhat lethargic and weak, but not confused. Seen by GI and plan for doing PEG tube today 06/25/2015. Also seen by Neuro and he continued on IVIG for Myasthenia.RD notes could be candidate for transfer to LTAC s/p PEG placement.  Diet Order:  Diet NPO time specified    Current Nutrition: NPO day 8   Gastrointestinal Profile: Last BM: 06/24/2015   Scheduled Medications:  . carvedilol  3.125 mg Per Tube BID WC  . insulin aspart  0-9 Units Subcutaneous 6 times per day  . lamoTRIgine  100 mg Oral BID  . latanoprost  1 drop Both Eyes QHS  . nitroGLYCERIN  1 inch Topical 3 times per day  . nystatin   Topical TID  . pantoprazole (PROTONIX) IV  40 mg Intravenous Q24H  . pyridostigmine  10 mg Intravenous TID  . spironolactone  25 mg Oral Daily    Continuous Medications:  . dextrose 50 mL/hr at 06/25/15 0130  . heparin Stopped (06/25/15 0820)     Electrolyte/Renal Profile and Glucose Profile:   Recent Labs Lab 06/20/15 0443 06/22/15 0450 06/24/15 0500  NA 149* 145 144  K 3.4* 3.9 3.4*  CL 112* 111 111  CO2 30 29 28   BUN 60* 32* 13  CREATININE  1.51* 1.28* 1.08*  CALCIUM 8.8* 8.6* 8.5*  GLUCOSE 113* 93 105*   Protein Profile: No results for input(s): ALBUMIN in the last 168 hours.   Nutritional Anemia Profile:  CBC Latest Ref Rng 06/25/2015 06/24/2015 06/22/2015  WBC 3.6 - 11.0 K/uL 6.0 6.3 10.5  Hemoglobin 12.0 - 16.0 g/dL 9.4(L) 9.1(L) 9.6(L)  Hematocrit 35.0 - 47.0 % 30.1(L) 28.9(L) 31.0(L)  Platelets 150 - 440 K/uL 207 205 225     Weight Trend since Admission: Filed Weights   06/23/15 0410 06/24/15 0300 06/24/15 2052  Weight: 133 lb 14.4 oz (60.737 kg) 130 lb (58.968 kg) 137 lb 14.4 oz (62.551 kg)     Skin:   (stage II sacrum)    BMI:  Body mass index is 23.66 kg/(m^2).  Estimated Nutritional Needs:   Kcal:  1550-1831 kcals (BEE 1084, 1.3 AF, 1.1-1.3 IF)   Protein:  (1.2-2.0 g/kg) 78-130 g/d  Fluid:  25-14ml/kg) 1625-1960ml/d  EDUCATION NEEDS:   No education needs identified at this time   David[place], RD, LDN Pager [phone]

## 2015-06-26 ENCOUNTER — Ambulatory Visit: Payer: Medicare Other | Admitting: Gastroenterology

## 2015-06-26 LAB — BASIC METABOLIC PANEL
Anion gap: 3 — ABNORMAL LOW (ref 5–15)
BUN: 8 mg/dL (ref 6–20)
CALCIUM: 8.5 mg/dL — AB (ref 8.9–10.3)
CHLORIDE: 112 mmol/L — AB (ref 101–111)
CO2: 26 mmol/L (ref 22–32)
CREATININE: 1 mg/dL (ref 0.44–1.00)
GFR calc non Af Amer: 55 mL/min — ABNORMAL LOW (ref >60)
GLUCOSE: 105 mg/dL — AB (ref 65–99)
Potassium: 3.5 mmol/L (ref 3.5–5.1)
Sodium: 141 mmol/L (ref 135–145)

## 2015-06-26 LAB — CBC
HCT: 30.1 % — ABNORMAL LOW (ref 35.0–47.0)
HEMOGLOBIN: 9.7 g/dL — AB (ref 12.0–16.0)
MCH: 26.7 pg (ref 26.0–34.0)
MCHC: 32.2 g/dL (ref 32.0–36.0)
MCV: 82.9 fL (ref 80.0–100.0)
PLATELETS: 209 10*3/uL (ref 150–440)
RBC: 3.63 MIL/uL — AB (ref 3.80–5.20)
RDW: 26.3 % — ABNORMAL HIGH (ref 11.5–14.5)
WBC: 6.3 10*3/uL (ref 3.6–11.0)

## 2015-06-26 LAB — GLUCOSE, CAPILLARY
GLUCOSE-CAPILLARY: 105 mg/dL — AB (ref 65–99)
GLUCOSE-CAPILLARY: 107 mg/dL — AB (ref 65–99)
GLUCOSE-CAPILLARY: 99 mg/dL (ref 65–99)
GLUCOSE-CAPILLARY: 99 mg/dL (ref 65–99)
Glucose-Capillary: 90 mg/dL (ref 65–99)
Glucose-Capillary: 111 mg/dL — ABNORMAL HIGH (ref 65–99)

## 2015-06-26 LAB — HEPARIN LEVEL (UNFRACTIONATED): Heparin Unfractionated: 0.48 IU/mL (ref 0.30–0.70)

## 2015-06-26 MED ORDER — MORPHINE SULFATE (PF) 2 MG/ML IV SOLN
2.0000 mg | Freq: Once | INTRAVENOUS | Status: AC
  Administered 2015-06-26: 2 mg via INTRAVENOUS
  Filled 2015-06-26: qty 1

## 2015-06-26 MED ORDER — FREE WATER
25.0000 mL | Status: DC
  Administered 2015-06-26 – 2015-06-27 (×9): 25 mL

## 2015-06-26 MED ORDER — GLUCERNA 1.2 CAL PO LIQD
1000.0000 mL | ORAL | Status: DC
  Administered 2015-06-26: 10:00:00 1000 mL

## 2015-06-26 MED ORDER — MORPHINE SULFATE (PF) 2 MG/ML IV SOLN
2.0000 mg | INTRAVENOUS | Status: DC | PRN
  Administered 2015-06-26 – 2015-06-27 (×7): 2 mg via INTRAVENOUS
  Filled 2015-06-26 (×7): qty 1

## 2015-06-26 NOTE — Progress Notes (Signed)
ANTICOAGULATION CONSULT NOTE - Follow Up Consult  Pharmacy Consult for Heparin Indication: DVT  Allergies  Allergen Reactions  . Lactose Intolerance (Gi) Other (See Comments)    Reaction:  GI upset   . Lithium Other (See Comments)    Reaction:  Unknown   . Penicillins Rash    Patient tolerated Ampicillin-Sulbactam     Patient Measurements: Height: 5\' 4"  (162.6 cm) Weight: 137 lb 1.6 oz (62.188 kg) IBW/kg (Calculated) : 54.7 Heparin Dosing Weight: 62.2 kg  Vital Signs: Temp: 98.3 F (36.8 C) (10/18 1402) Temp Source: Oral (10/18 1402) BP: 148/64 mmHg (10/18 1402) Pulse Rate: 76 (10/18 1402)  Labs:  Recent Labs  06/24/15 0500 06/25/15 0453 06/26/15 0434 06/26/15 1054  HGB 9.1* 9.4* 9.7*  --   HCT 28.9* 30.1* 30.1*  --   PLT 205 207 209  --   HEPARINUNFRC 0.68 0.73*  --  0.48  CREATININE 1.08*  --  1.00  --     Estimated Creatinine Clearance: 44.6 mL/min (by C-G formula based on Cr of 1).   Medications:  Scheduled:  . carvedilol  3.125 mg Per Tube BID WC  . free water  25 mL Per Tube Q4H  . insulin aspart  0-9 Units Subcutaneous 6 times per day  . ipratropium-albuterol  3 mL Nebulization Q6H  . lamoTRIgine  100 mg Oral BID  . latanoprost  1 drop Both Eyes QHS  . nitroGLYCERIN  1 inch Topical 3 times per day  . nystatin   Topical TID  . pantoprazole (PROTONIX) IV  40 mg Intravenous Q24H  . pyridostigmine  10 mg Intravenous TID  . sodium chloride  10 mL Intravenous Daily  . spironolactone  25 mg Oral Daily   Infusions:  . dextrose 50 mL/hr at 06/26/15 0001  . feeding supplement (GLUCERNA 1.2 CAL) 1,000 mL (06/26/15 1028)  . heparin 1,000 Units/hr (06/26/15 0104)    Assessment: Patient restarted on Heparin drip post PEG placement. Will transition to oral therapy soon. Heparin level resulted at 0.43.  Goal of Therapy:  Heparin level 0.3-0.7 units/ml Monitor platelets by anticoagulation protocol: Yes   Plan:  Will continue with current regimen of  Heparin 1000 units/hr. Will recheck HL and CBC with am labs.  Paulina Fusi, PharmD, BCPS 06/26/2015 3:33 PM

## 2015-06-26 NOTE — Progress Notes (Addendum)
ANTICOAGULATION CONSULT NOTE - Follow Up Consult  Pharmacy Consult for Heparin Indication: DVT  Allergies  Allergen Reactions  . Lactose Intolerance (Gi) Other (See Comments)    Reaction:  GI upset   . Lithium Other (See Comments)    Reaction:  Unknown   . Penicillins Rash    Patient tolerated Ampicillin-Sulbactam     Patient Measurements: Height: 5\' 4"  (162.6 cm) Weight: 137 lb 1.6 oz (62.188 kg) IBW/kg (Calculated) : 54.7 Heparin Dosing Weight:   Vital Signs: Temp: 98.9 F (37.2 C) (10/18 0446) Temp Source: Oral (10/18 0446) BP: 144/85 mmHg (10/18 0446) Pulse Rate: 87 (10/18 0446)  Labs:  Recent Labs  06/24/15 0500 06/25/15 0453 06/26/15 0434  HGB 9.1* 9.4* 9.7*  HCT 28.9* 30.1* 30.1*  PLT 205 207 209  HEPARINUNFRC 0.68 0.73*  --   CREATININE 1.08*  --  1.00    Estimated Creatinine Clearance: 44.6 mL/min (by C-G formula based on Cr of 1).   Medications:  Prescriptions prior to admission  Medication Sig Dispense Refill Last Dose  . acetaminophen (TYLENOL) 650 MG CR tablet Take 650 mg by mouth daily. Pt takes daily at 2pm and every four hours as needed for pain.   Taking  . albuterol (PROVENTIL HFA;VENTOLIN HFA) 108 (90 BASE) MCG/ACT inhaler Inhale 2 puffs into the lungs every 4 (four) hours as needed for wheezing or shortness of breath. 6.7 g 1 Taking  . bismuth subsalicylate (PEPTO BISMOL) 262 MG/15ML suspension Take 5 mLs by mouth 4 (four) times daily as needed for diarrhea or loose stools (or nausea).   Taking  . busPIRone (BUSPAR) 10 MG tablet Take 10 mg by mouth 2 (two) times daily.    Taking  . Dextromethorphan-Benzocaine (SORE THROAT & COUGH LOZENGES MT) Use as directed 1 lozenge in the mouth or throat 4 (four) times daily as needed (for cough).   Taking  . diclofenac sodium (VOLTAREN) 1 % GEL Apply 2 g topically 2 (two) times daily.    Taking  . diltiazem (CARDIZEM CD) 180 MG 24 hr capsule Take 180 mg by mouth daily.    Taking  . donepezil (ARICEPT) 10  MG tablet Take 1 tablet (10 mg total) by mouth daily. 30 tablet 3 Taking  . doxycycline (VIBRAMYCIN) 50 MG capsule Take 1 capsule by mouth 2 (two) times daily.     . ferrous sulfate 325 (65 FE) MG tablet Take 1 tablet (325 mg total) by mouth 2 (two) times daily with a meal. 60 tablet 3 Taking  . Fluticasone-Salmeterol (ADVAIR) 250-50 MCG/DOSE AEPB Inhale 1 puff into the lungs 2 (two) times daily.   Taking  . glimepiride (AMARYL) 1 MG tablet Take 1 tablet by mouth 2 (two) times daily.     Marland Kitchen guaifenesin (ROBITUSSIN) 100 MG/5ML syrup Take 100 mg by mouth 2 (two) times daily as needed for cough.   Taking  . hydrALAZINE (APRESOLINE) 50 MG tablet Take 1 tablet (50 mg total) by mouth 3 (three) times daily. 90 tablet 0 Taking  . lamoTRIgine (LAMICTAL) 100 MG tablet Take 100 mg by mouth 2 (two) times daily.   Taking  . latanoprost (XALATAN) 0.005 % ophthalmic solution Place 1 drop into both eyes at bedtime.    Taking  . nicotine (NICODERM CQ - DOSED IN MG/24 HR) 7 mg/24hr patch Place 1 patch (7 mg total) onto the skin daily. 15 patch 0 Taking  . ondansetron (ZOFRAN) 4 MG tablet Take 1 tablet (4 mg total) by mouth every 8 (  eight) hours as needed for nausea or vomiting. 20 tablet 0 Taking  . pantoprazole (PROTONIX) 40 MG tablet Take 1 tablet (40 mg total) by mouth 2 (two) times daily before a meal. 60 tablet 2 Taking  . phenylephrine-shark liver oil-mineral oil-petrolatum (PREPARATION H) 0.25-3-14-71.9 % rectal ointment Place 1 application rectally 2 (two) times daily as needed for hemorrhoids.   Taking  . polyethylene glycol (MIRALAX / GLYCOLAX) packet Take 17 g by mouth daily as needed for mild constipation.   Taking  . potassium chloride SA (K-DUR,KLOR-CON) 20 MEQ tablet Take 20 mEq by mouth daily.   Taking  . predniSONE (DELTASONE) 10 MG tablet Take 30 mg by mouth every other day.   Taking  . pyridostigmine (MESTINON) 60 MG tablet Take 60 mg by mouth 3 (three) times daily.    Taking  . QUEtiapine  (SEROQUEL) 200 MG tablet Take 1 tablet (200 mg total) by mouth at bedtime. 30 tablet 3 Taking  . sodium chloride (OCEAN) 0.65 % SOLN nasal spray Place 2 sprays into both nostrils daily as needed for congestion.    Taking  . traMADol (ULTRAM) 50 MG tablet Take 50 mg by mouth 2 (two) times daily as needed.   Taking  . traZODone (DESYREL) 50 MG tablet Take 1 tablet (50 mg total) by mouth at bedtime. 30 tablet 0 Taking    Assessment: Patient is a 69 yo female receiving Heparin drip for DVT in right upper extremity DVT.   1017:  Heparin gtt held for procedure.   Per Dr Clayton Bibles,  Will restart heparin on 10/18 @ 1:00, with no bolus.  Will need to f/u with RN to ensure restart and then order HL 8 hrs after restart.    HL at 0500 on 10/18: 0.73  Goal of Therapy:  Heparin level 0.3-0.7 units/ml Monitor platelets by anticoagulation protocol: Yes   Plan:   Heparin level at 0500 just above goal range of 0.3-0.7.  Heparin drip restarted at 0100 and level drawn ~4 hours later.  Will continue current rate and recheck level in 6 hours at 1100.  Patient previously on this rate with therapeutic heparin levels.  Pharmacy will continue to follow.  Trust Leh G, Pharm.D.  Clinical Pharmacist 06/26/2015,8:31 AM

## 2015-06-26 NOTE — Care Management (Signed)
[facility] unable to accept [name] into their facility. Will update Dr. Ether Griffins. Possible discharge back to [facility] tomorrow? Shelbie Ammons RN MSN Care Management [phone]

## 2015-06-26 NOTE — Progress Notes (Signed)
GI Inpatient Follow-up Note  Patient Identification: Mackenzie Rose is a 69 y.o. female s/p PEG for OP dysphagia, aspirartion  Subjective:  Pain around g-tube site.  Like tightness.  No f/c. No n/v. Wants to know when she can eat.    Scheduled Inpatient Medications:  . carvedilol  3.125 mg Per Tube BID WC  . free water  25 mL Per Tube Q4H  . insulin aspart  0-9 Units Subcutaneous 6 times per day  . ipratropium-albuterol  3 mL Nebulization Q6H  . lamoTRIgine  100 mg Oral BID  . latanoprost  1 drop Both Eyes QHS  . nitroGLYCERIN  1 inch Topical 3 times per day  . nystatin   Topical TID  . pantoprazole (PROTONIX) IV  40 mg Intravenous Q24H  . pyridostigmine  10 mg Intravenous TID  . sodium chloride  10 mL Intravenous Daily  . spironolactone  25 mg Oral Daily    Continuous Inpatient Infusions:   . dextrose 50 mL/hr at 06/26/15 2026  . feeding supplement (GLUCERNA 1.2 CAL) 1,000 mL (06/26/15 1028)  . heparin 1,000 Units/hr (06/26/15 0104)    PRN Inpatient Medications:  albuterol, bisacodyl, guaiFENesin-dextromethorphan, ipratropium-albuterol, LORazepam, metoprolol, morphine injection, [DISCONTINUED] ondansetron **OR** ondansetron (ZOFRAN) IV, sodium chloride     Physical Examination: BP 148/64 mmHg  Pulse 76  Temp(Src) 98.3 F (36.8 C) (Oral)  Resp 18  Ht 5\' 4"  (1.626 m)  Wt 62.188 kg (137 lb 1.6 oz)  BMI 23.52 kg/m2  SpO2 98% Gen: NAD, alert and oriented x 2, somnolent Neck: supple, no JVD Chest:+ decreased bilat, + some accessory use CV: RRR, no m/g/c/r Abd: peg site c/d/i, nabs, soft Ext: no edema, well perfused with 2+ pulses, Skin: no rash or lesions noted Lymph: no LAD  Data: Lab Results  Component Value Date   WBC 6.3 06/26/2015   HGB 9.7* 06/26/2015   HCT 30.1* 06/26/2015   MCV 82.9 06/26/2015   PLT 209 06/26/2015    Recent Labs Lab 06/24/15 0500 06/25/15 0453 06/26/15 0434  HGB 9.1* 9.4* 9.7*   Lab Results  Component Value Date   NA 141  06/26/2015   K 3.5 06/26/2015   CL 112* 06/26/2015   CO2 26 06/26/2015   BUN 8 06/26/2015   CREATININE 1.00 06/26/2015   Lab Results  Component Value Date   ALT 18 06/08/2015   AST 26 06/08/2015   ALKPHOS 114 06/08/2015   BILITOT 0.7 06/08/2015   No results for input(s): APTT, INR, PTT in the last 168 hours.   Assessment/Plan: Mackenzie Rose is a 69 y.o. female s/p PEG for OP dysphagia, aspiration.  G-tube functioning well.  I loosened the ext bumber to 4 cm today to prevent erosion, buried bumper.   - I will sign off.    Please call with questions or concerns.  Mackenzie Rose, Mackenzie Blight, MD

## 2015-06-26 NOTE — Care Management (Signed)
Spoke with Ms. Bier at the bedside. States that she is in agreement with having her chart screened for admission to [facility] in [place]. Cherly Hensen, Engineer, water for [facility] updated. Shelbie Ammons RN MSN Care Management [phone]

## 2015-06-26 NOTE — Progress Notes (Signed)
Patient's NIFM -22cm

## 2015-06-26 NOTE — Progress Notes (Signed)
Dr Vaickute-2mg  morphine IV every 4 hours as needed for pain

## 2015-06-26 NOTE — Progress Notes (Addendum)
Nutrition Follow-up   INTERVENTION:  EN: Upon chart review of previous blood sugars since admission and h/o DM, will recommend Glucerna 1.2 instead of Jevity 1.2 to aid with blood sugar control. Will continue to recommend a goal rate of 48mL/hr to provide 1872kcals, 94g protein and 1244mL of free water in TF. Will recommend starting TF at 65mL/hr and titrate by 28mL q12 hours as pt tolerates, and starting free water flushes of 73mL q4 hours with plan to increase once pt off IVF. Will also recommend checking Mg, P as pt has been NPO for >7 days. Will continue to follow and provide assistance as needed.    NUTRITION DIAGNOSIS:   Inadequate oral intake related to acute illness as evidenced by NPO status, being address with EN s/p PEG placement  GOAL:   Patient will meet greater than or equal to 90% of their needs; ongoing  MONITOR:    (Energy intake, Pulmonary profile, Digestive system)  REASON FOR ASSESSMENT:   Consult Enteral/tube feeding initiation and management  ASSESSMENT:   Pt s/p PEG placement yesterday, per GI ok to start nutrition 10/18 via PEG.   Diet Order:  Diet NPO time specified    Gastrointestinal Profile: noted distended, soft abdomen with hypoactive BS documented yesterdady Last BM: 06/24/2015   Scheduled Medications:  . carvedilol  3.125 mg Per Tube BID WC  . free water  25 mL Per Tube Q4H  . insulin aspart  0-9 Units Subcutaneous 6 times per day  . ipratropium-albuterol  3 mL Nebulization Q6H  . lamoTRIgine  100 mg Oral BID  . latanoprost  1 drop Both Eyes QHS  . nitroGLYCERIN  1 inch Topical 3 times per day  . nystatin   Topical TID  . pantoprazole (PROTONIX) IV  40 mg Intravenous Q24H  . pyridostigmine  10 mg Intravenous TID  . sodium chloride  10 mL Intravenous Daily  . spironolactone  25 mg Oral Daily    Continuous Medications:  . dextrose 50 mL/hr at 06/26/15 0001  . feeding supplement (GLUCERNA 1.2 CAL)    . heparin 1,000 Units/hr (06/26/15  0104)     Electrolyte/Renal Profile and Glucose Profile:   Recent Labs Lab 06/22/15 0450 06/24/15 0500 06/26/15 0434  NA 145 144 141  K 3.9 3.4* 3.5  CL 111 111 112*  CO2 29 28 26   BUN 32* 13 8  CREATININE 1.28* 1.08* 1.00  CALCIUM 8.6* 8.5* 8.5*  GLUCOSE 93 105* 105*   Protein Profile: No results for input(s): ALBUMIN in the last 168 hours.   Weight Trend since Admission: Filed Weights   06/24/15 0300 06/24/15 2052 06/25/15 2213  Weight: 130 lb (58.968 kg) 137 lb 14.4 oz (62.551 kg) 137 lb 1.6 oz (62.188 kg)    Nutrition-Focused Physical Exam: Nutrition-Focused physical exam completed. Findings are WDL for fat depletion, muscle depletion, and edema, however somewhat difficult to assess legs as pt positioned in bed on visit.   Skin:   (stage II sacrum)   BMI:  Body mass index is 23.52 kg/(m^2).  Estimated Nutritional Needs:   Kcal:  1550-1831 kcals (BEE 1084, 1.3 AF, 1.1-1.3 IF)   Protein:  (1.2-2.0 g/kg) 78-130 g/d  Fluid:  25-38ml/kg) 1625-1982ml/d  EDUCATION NEEDS:   No education needs identified at this time   [place], RD, LDN Pager [phone]

## 2015-06-26 NOTE — Progress Notes (Signed)
NIF - 21cmH2O 

## 2015-06-26 NOTE — Plan of Care (Signed)
Problem: Discharge Progression Outcomes Goal: Other Discharge Outcomes/Goals Outcome: Progressing Pt is alert, disoriented to time. C/o pain in back and stomach, morphine given x1 for pain with no improvement. Patient reassessed and was asleep. C/o not being able to breath, reassured patient, rechecked vitals with oxygen stable. PEG tube in place, Dr. Rayann Heman paged x3 for orders regarding PEG tube use, states okay to use for meds and water, await nutrition to select feeding for patient.

## 2015-06-26 NOTE — Progress Notes (Signed)
[facility] [place] NAME: Clarise Chacko    MR#:  [id number]  DATE OF BIRTH:  19-Jan-1946  SUBJECTIVE:   Complains of abdominal pain. Where PEG tube was placed yesterday, wants to eat, breathing is good. Patient is started on heparin to be continued for the next 10-4 hours until a liquids could be initiated per PEG tube provided. No bleeding insures. Care management is working in regards to [facility] placement.      Review of Systems  Constitutional: Negative for fever and chills.  HENT: Negative for congestion and tinnitus.   Eyes: Negative for blurred vision and double vision.  Respiratory: Positive for cough and sputum production. Negative for shortness of breath and wheezing.   Cardiovascular: Negative for chest pain, orthopnea and PND.  Gastrointestinal: Negative for nausea, vomiting, abdominal pain and diarrhea.  Genitourinary: Negative for dysuria and hematuria.  Neurological: Positive for weakness (generalized). Negative for dizziness, sensory change and focal weakness.  All other systems reviewed and are negative.  Tolerating Diet: Evaluated by speech and high risk for aspiration and will keep nothing by mouth Tolerating PT: Evaluation noted.   DRUG ALLERGIES:   Allergies  Allergen Reactions  . Lactose Intolerance (Gi) Other (See Comments)    Reaction:  GI upset   . Lithium Other (See Comments)    Reaction:  Unknown   . Penicillins Rash    Patient tolerated Ampicillin-Sulbactam     VITALS:  Blood pressure 144/85, pulse 87, temperature 98.9 F (37.2 C), temperature source Oral, resp. rate 12, height 5\' 4"  (1.626 m), weight 62.188 kg (137 lb 1.6 oz), SpO2 97 %.  PHYSICAL EXAMINATION:   Physical Exam  GENERAL:  69 y.o.-year-old patient lying in the bed globally weak and lethargic  EYES: pupils are equally reactive to light and accommodation. No scleral icterus. HEENT: Head atraumatic, normocephalic. Oropharynx and  nasopharynx clear.   NECK:  Supple, no jugular venous distention. No thyroid enlargement, no tenderness.  LUNGS: Poor respiratory effort. Positive upper airway rhonchi, no rales, wheezes. No use of accessory muscles of respiration.  CARDIOVASCULAR: S1, S2 normal. No murmurs, rubs, or gallops.  ABDOMEN: Soft, nontender, nondistended. Bowel sounds present. No organomegaly or mass.  EXTREMITIES: No cyanosis, clubbing, +1 edema bilaterally.  Right upper ext. Edema and wamth much improved.  NEUROLOGIC: Globally weak but no other focal motor or sensory deficits bilaterally.   PSYCHIATRIC: alert, oriented x 3. Good affect  SKIN: No ulcer, rash, lesions  LABORATORY PANEL:   CBC  Recent Labs Lab 06/26/15 0434  WBC 6.3  HGB 9.7*  HCT 30.1*  PLT 209    Chemistries   Recent Labs Lab 06/26/15 0434  NA 141  K 3.5  CL 112*  CO2 26  GLUCOSE 105*  BUN 8  CREATININE 1.00  CALCIUM 8.5*    Cardiac Enzymes No results for input(s): TROPONINI in the last 168 hours.  RADIOLOGY:  Dg Chest Port 1 View  06/25/2015  CLINICAL DATA:  Subsequent encounter for pneumonia EXAM: PORTABLE CHEST 1 VIEW COMPARISON:  06/19/2015. FINDINGS: Interval improvement in bibasilar aeration although basilar airspace disease does persist bilaterally. Right PICC line tip overlies the SVC/ RA junction. Left lung calcified granulomas stable. Cardiopericardial silhouette is at upper limits of normal for size. Bones are diffusely demineralized. Prominent skin fold overlies the lateral right hemi thorax. IMPRESSION: Interval improvement in basilar aeration.  Otherwise stable exam. Electronically Signed   By: Misty Stanley M.D.   On:  06/25/2015 07:30     ASSESSMENT AND PLAN:  69 y.o. female who presents with shortness of breath, worsening over the last 3-4 days. Patient states that she's also had some intermittent chest pain throughout the same time. She states that she's had some episodes of acute CHF in the past, but  denies being chronically followed for CHF  1. acute hypoxic respiratory failure secondary to Acute systolic CHF (congestive heart failure) and Aspiration PNA -Patient has been intubated and then extubated twice during the hospital course. Her respiratory status is tenuous given her high risk for aspiration. Clinically she does not appear to be in congestive heart failure presently. Chest x-ray revealed improvement in basilar aeration.  -She was diuresed with IV Lasix and responded well -Patient had a modified barium swallow  which was high risk for aspiration. Seen by GI and plan to place PEG tube on Monday.  Continue to wean off oxygen as tolerated, now on 2 L  2. Acute on Chronic RF (acute renal failure) with known history of chronic renal failure stage III  -Creatinine 1.2 , October 13, 1.08 - 16th of October and at baseline. Continue to monitor -Renal ultrasound bilateral kidney cysts.  3. Myasthenia gravis - appreciate Neuro input , patient completed IVIG X 3 days and is relatively stable. At this point it is unclear if any improvement, although today she appears to be more alert - cont. Pyridostigmine  4.HTN (hypertension)-stable  -continue coreg, hydralazine, spironolactone. The pressure is relatively well controlled  5.Type 2 diabetes mellitus-blood sugar stable and continue sliding scale insulin   6.COPD (chronic obstructive pulmonary disease) - continue duo nebs.  Stable clinically  7.h/o chronic afib -Rate controlled and continue on coreg.  -High risk for falls and therefore not on anticoagulation.  8.Bipolar affective disorder, mixed - continue Lamictal  9. Right groin skin rash appears candidial-much improved -Continue Nystatin powder   10. Right upper extremity DVT-continue heparin drip until tomorrow morning and if no bleeding, we will change her to Eliquis. - will switch to Oral meds once PEG tube placed.      Appreciate Palliative care help and pt. Is a DNR now.   Discussed w/ Case Manager and pt. Could be a candidate for LTAC placement once PEG tube is placed on Monday.    CODE STATUS:FULL  DVT Prophylaxis: Heparin gtt.   TOTAL TIME Spent in CARE OF THIS PATIENT: 35 minutes.  Coordination of care time 10 minutes  Keiri Solano M.D on 06/26/2015 at 9:03 AM  Between 7am to 6pm - Pager - [phone]  After 6pm go to [email or web address] - password [email or web address] [facility]  Office  [phone]  CC: Primary care physician; Donato Schultz, MD

## 2015-06-27 ENCOUNTER — Encounter: Payer: Self-pay | Admitting: Gastroenterology

## 2015-06-27 ENCOUNTER — Ambulatory Visit: Payer: Medicare Other | Admitting: Family

## 2015-06-27 DIAGNOSIS — B372 Candidiasis of skin and nail: Secondary | ICD-10-CM

## 2015-06-27 DIAGNOSIS — R1310 Dysphagia, unspecified: Secondary | ICD-10-CM

## 2015-06-27 DIAGNOSIS — D72829 Elevated white blood cell count, unspecified: Secondary | ICD-10-CM

## 2015-06-27 DIAGNOSIS — J690 Pneumonitis due to inhalation of food and vomit: Secondary | ICD-10-CM

## 2015-06-27 DIAGNOSIS — Z931 Gastrostomy status: Secondary | ICD-10-CM

## 2015-06-27 DIAGNOSIS — N183 Chronic kidney disease, stage 3 unspecified: Secondary | ICD-10-CM

## 2015-06-27 DIAGNOSIS — I4891 Unspecified atrial fibrillation: Secondary | ICD-10-CM

## 2015-06-27 DIAGNOSIS — J9601 Acute respiratory failure with hypoxia: Secondary | ICD-10-CM

## 2015-06-27 DIAGNOSIS — I82621 Acute embolism and thrombosis of deep veins of right upper extremity: Secondary | ICD-10-CM

## 2015-06-27 DIAGNOSIS — I5043 Acute on chronic combined systolic (congestive) and diastolic (congestive) heart failure: Secondary | ICD-10-CM

## 2015-06-27 LAB — GLUCOSE, CAPILLARY
GLUCOSE-CAPILLARY: 129 mg/dL — AB (ref 65–99)
GLUCOSE-CAPILLARY: 132 mg/dL — AB (ref 65–99)
Glucose-Capillary: 116 mg/dL — ABNORMAL HIGH (ref 65–99)
Glucose-Capillary: 128 mg/dL — ABNORMAL HIGH (ref 65–99)
Glucose-Capillary: 160 mg/dL — ABNORMAL HIGH (ref 65–99)

## 2015-06-27 LAB — CBC
HCT: 30.6 % — ABNORMAL LOW (ref 35.0–47.0)
Hemoglobin: 9.5 g/dL — ABNORMAL LOW (ref 12.0–16.0)
MCH: 26 pg (ref 26.0–34.0)
MCHC: 30.9 g/dL — AB (ref 32.0–36.0)
MCV: 84 fL (ref 80.0–100.0)
PLATELETS: 200 10*3/uL (ref 150–440)
RBC: 3.65 MIL/uL — AB (ref 3.80–5.20)
RDW: 26.2 % — AB (ref 11.5–14.5)
WBC: 6.6 10*3/uL (ref 3.6–11.0)

## 2015-06-27 LAB — MAGNESIUM: MAGNESIUM: 1.7 mg/dL (ref 1.7–2.4)

## 2015-06-27 LAB — HEPARIN LEVEL (UNFRACTIONATED): Heparin Unfractionated: 0.62 IU/mL (ref 0.30–0.70)

## 2015-06-27 LAB — PHOSPHORUS: Phosphorus: 3.5 mg/dL (ref 2.5–4.6)

## 2015-06-27 MED ORDER — INSULIN ASPART 100 UNIT/ML ~~LOC~~ SOLN: 0.0000 [IU] | SUBCUTANEOUS | Status: AC

## 2015-06-27 MED ORDER — CARVEDILOL 3.125 MG PO TABS: 3.1250 mg | ORAL_TABLET | Freq: Two times a day (BID) | ORAL | Status: AC

## 2015-06-27 MED ORDER — APIXABAN 5 MG PO TABS: 10.0000 mg | ORAL_TABLET | Freq: Two times a day (BID) | ORAL | Status: AC

## 2015-06-27 MED ORDER — APIXABAN 5 MG PO TABS: 5.0000 mg | ORAL_TABLET | Freq: Two times a day (BID) | ORAL | Status: DC

## 2015-06-27 MED ORDER — GUAIFENESIN-DM 100-10 MG/5ML PO SYRP: 5.0000 mL | ORAL_SOLUTION | ORAL | Status: AC | PRN

## 2015-06-27 MED ORDER — MORPHINE SULFATE (PF) 2 MG/ML IV SOLN
2.0000 mg | Freq: Once | INTRAVENOUS | Status: AC
  Administered 2015-06-27: 2 mg via INTRAVENOUS
  Filled 2015-06-27: qty 1

## 2015-06-27 MED ORDER — SPIRONOLACTONE 25 MG PO TABS: 25.0000 mg | ORAL_TABLET | Freq: Every day | ORAL | Status: AC

## 2015-06-27 MED ORDER — FREE WATER: 25.0000 mL | Status: AC

## 2015-06-27 MED ORDER — APIXABAN 5 MG PO TABS: 10.0000 mg | ORAL_TABLET | Freq: Two times a day (BID) | ORAL | Status: DC

## 2015-06-27 MED ORDER — APIXABAN 5 MG PO TABS
10.0000 mg | ORAL_TABLET | Freq: Two times a day (BID) | ORAL | Status: DC
  Administered 2015-06-27: 10:00:00 10 mg via ORAL
  Filled 2015-06-27: qty 2

## 2015-06-27 MED ORDER — APIXABAN 5 MG PO TABS: 5.0000 mg | ORAL_TABLET | Freq: Two times a day (BID) | ORAL | Status: AC

## 2015-06-27 MED ORDER — GLUCERNA 1.2 CAL PO LIQD: 1000.0000 mL | ORAL | Status: AC

## 2015-06-27 NOTE — Anesthesia Postprocedure Evaluation (Signed)
  Anesthesia Post-op Note  Patient: Mackenzie Rose  Procedure(s) Performed: Procedure(s): ESOPHAGOGASTRODUODENOSCOPY (EGD) WITH PROPOFOL (N/A) PERCUTANEOUS ENDOSCOPIC GASTROSTOMY (PEG) PLACEMENT (N/A)  Anesthesia type:General  Patient location: PACU  Post pain: Pain level controlled  Post assessment: Post-op Vital signs reviewed, Patient's Cardiovascular Status Stable, Respiratory Function Stable, Patent Airway and No signs of Nausea or vomiting  Post vital signs: Reviewed and stable  Last Vitals:  Filed Vitals:   06/27/15 1255  BP: 151/62  Pulse: 68  Temp: 37.2 C  Resp: 20    Level of consciousness: awake, alert  and patient cooperative  Complications: No apparent anesthesia complications

## 2015-06-27 NOTE — Progress Notes (Signed)
ANTICOAGULATION CONSULT NOTE - Follow Up Consult  Pharmacy Consult for Heparin Indication: DVT  Allergies  Allergen Reactions  . Lactose Intolerance (Gi) Other (See Comments)    Reaction:  GI upset   . Lithium Other (See Comments)    Reaction:  Unknown   . Penicillins Rash    Patient tolerated Ampicillin-Sulbactam     Patient Measurements: Height: 5\' 4"  (162.6 cm) Weight: 134 lb 9.6 oz (61.054 kg) IBW/kg (Calculated) : 54.7 Heparin Dosing Weight: 62.2 kg  Vital Signs: Temp: 99.1 F (37.3 C) (10/19 0429) Temp Source: Oral (10/19 0429) BP: 152/71 mmHg (10/19 0429) Pulse Rate: 86 (10/19 0429)  Labs:  Recent Labs  06/25/15 0453 06/26/15 0434 06/26/15 1054 06/27/15 0447  HGB 9.4* 9.7*  --  9.5*  HCT 30.1* 30.1*  --  30.6*  PLT 207 209  --  200  HEPARINUNFRC 0.73*  --  0.48 0.62  CREATININE  --  1.00  --   --     Estimated Creatinine Clearance: 44.6 mL/min (by C-G formula based on Cr of 1).   Medications:  Scheduled:  . carvedilol  3.125 mg Per Tube BID WC  . free water  25 mL Per Tube Q4H  . insulin aspart  0-9 Units Subcutaneous 6 times per day  . ipratropium-albuterol  3 mL Nebulization Q6H  . lamoTRIgine  100 mg Oral BID  . latanoprost  1 drop Both Eyes QHS  . nitroGLYCERIN  1 inch Topical 3 times per day  . nystatin   Topical TID  . pantoprazole (PROTONIX) IV  40 mg Intravenous Q24H  . pyridostigmine  10 mg Intravenous TID  . sodium chloride  10 mL Intravenous Daily  . spironolactone  25 mg Oral Daily   Infusions:  . dextrose 50 mL/hr at 06/26/15 2026  . feeding supplement (GLUCERNA 1.2 CAL) 1,000 mL (06/26/15 2356)  . heparin 1,000 Units/hr (06/27/15 0104)    Assessment: Patient restarted on Heparin drip post PEG placement. Will transition to oral therapy soon. Heparin level resulted at 0.43.  Goal of Therapy:  Heparin level 0.3-0.7 units/ml Monitor platelets by anticoagulation protocol: Yes   Plan:  Will continue with current regimen of  Heparin 1000 units/hr. Will recheck HL and CBC with am labs.  10/19 AM heparin level 0.62. Continue current regimen. Recheck CBC and heparin level in AM.  Sim Boast, PharmD, BCPS  06/27/2015

## 2015-06-27 NOTE — Discharge Summary (Addendum)
[name]    MR#:  [id number]  DATE OF BIRTH:  1946-06-10  DATE OF ADMISSION:  06/08/2015 ADMITTING PHYSICIAN: Lance Coon, MD  DATE OF DISCHARGE: 06/27/2015.  PRIMARY CARE PHYSICIAN: Lenor Coffin A, MD     ADMISSION DIAGNOSIS:  Hyperkalemia [E87.5] Acute renal insufficiency [N28.9] Acute congestive heart failure, unspecified congestive heart failure type ([place]) [I50.9]  DISCHARGE DIAGNOSIS:  Principal Problem:   Acute systolic CHF (congestive heart failure) Active Problems:   Encounter for intubation   SOB (shortness of breath)   Dyspnea   Acute respiratory failure with hypoxia (HCC)   Acute on chronic combined systolic and diastolic CHF (congestive heart failure) (HCC)   Aspiration pneumonitis (HCC)   Dysphagia   Status post insertion of percutaneous endoscopic gastrostomy (PEG) tube (HCC)   Myasthenia gravis ([place])   ARF (acute renal failure) (HCC)   Leukocytosis   Acute deep vein thrombosis (DVT) of right upper extremity (HCC)   Dementia   Bipolar affective disorder, mixed (HCC)   HTN (hypertension)   Type 2 diabetes mellitus (HCC)   COPD (chronic obstructive pulmonary disease) (HCC)   Pressure ulcer   Atrial fibrillation (HCC)   Skin candidiasis   CKD (chronic kidney disease), stage III   SECONDARY DIAGNOSIS:   Past Medical History  Diagnosis Date  . Heart disease   . Atrial fibrillation ([place])     a. reported a-fib; b. unknown chronicity; c. dates back to 1970s; d. not on long term anticoagulation 2/2 no documented a-fib  . Hypertension   . High cholesterol   . Diabetes ([place])     borderline  . COPD (chronic obstructive pulmonary disease) ([place])   . Melanoma ([place])   . Kidney disorder   . Tremor   . Autoimmune disease ([place])   . Depressed   . Anxiety disorder   . Migraine   . Dementia   . Motion sickness   . Myasthenia gravis ([place])   . Bipolar disorder ([place])   .  Dementia   . Acute renal failure ([place])   . Near syncope     .pro HOSPITAL COURSE:   Patient is 69 year old Caucasian female with history of A. fib, heart disease, hypertension, COPD who presents to the hospital with worsening shortness of breath as well as some chest pain. On arrival to emergency room, she was so short of breath that she required BiPAP. Her chest x-ray initially revealed CHF, but the repeated revealed aspiration pneumonitis as well. Patient was given IV Lasix and admitted to the hospital. On arrival to the hospital patient's creatinine level was noted to be elevated at 2.15. Patient deteriorated on 06/11/2015 and was intubated. She was managed on vent and extubated, however, deteriorated again and had to be reintubated. She was diuresed, watching her kidney function closely and overall improved. She was noted to have dysphagia symptoms and underwent modified barium swallowing study which revealed aspirations with all consistencies. It was felt that patient is not safe to eat, so gastroenterology consultation was obtained for PEG tube placement which was performed on 06/25/2015. Patient was initiated on tube feeds and tolerated tube feeds well. She was evaluated by dietary specialist who recommended Glucerna 1.2. instead of Jevity 1.2-8 for better blood glucose level control and recommended goal was 65 mL an hour. . Water flushes should be continued at 25 cc every 4 hours. Today,  19th of October 2016, patient is at 50 cc an  hour of Glucerna 1.2, and tolerates this nutrition well except of mild abdominal pains, which was suspected due to PEG tube placement. Patient is being kept nothing by mouth . With this conservative therapy and therapy for aspiration pneumonitis patient's oxygen was weaned down to 2 L of oxygen through nasal cannula, most recent chest x-ray done on 06/25/2015 revealed interval improvement in basilar aeration. Because of generalized weakness and concern about myasthenia  gravis exacerbation patient was initiated on IVIG by neurologist and continued for 3 days with some improvement of her overall strength. Patient was evaluated by physical therapist and recommended skilled nursing facility rehabilitation placement Discussion by problem 1. acute hypoxic respiratory failure secondary to Acute combined systolic, diastolic CHF (congestive heart failure) and Aspiration PNA, echocardiogram on this admission revealed moderately reduced left ventricular function with ejection fraction of 35-40%. Calcified mitral annulus with mild to moderate mitral regurgitation, diffuse hypokinesis.  -Patient has been intubated and then extubated twice during the hospital course. Her respiratory status is tenuous given her high risk for aspiration. Clinically she does not appear to be in congestive heart failure presently. Chest x-ray revealed improvement in basilar aeration.  -She was diuresed with IV Lasix and responded well -Patient had a modified barium swallow which was high risk for aspiration. Seen by GI and had  PEG tube placed. 18th of October 2016. Continue to wean off oxygen as tolerated, now on 2 L  2. Acute on Chronic RF (acute renal failure) with known history of chronic renal failure stage III  -Creatinine 1.2 , October 13, 1.08 - 16th of October and at baseline. Continue to monitor -Renal ultrasound bilateral kidney cysts.  3. Myasthenia gravis - appreciate Neuro input , patient completed IVIG X 3 days and is relatively stable. At this point it is unclear if any improvement, although she appears to be more alert and active overall - cont. Pyridostigmine  4.HTN (hypertension)-stable  -continue coreg, hydralazine, spironolactone. The pressure is relatively well controlled  5.Type 2 diabetes mellitus-blood sugar stable and continue sliding scale insulin , may need to resume her home diabetic medications since she is on tube feeds at present, although her blood glucose  level is ranging between 110-130 over the past 24-48 hours  6.COPD (chronic obstructive pulmonary disease) - continue duo nebs. Stable clinically  7.h/o chronic afib -Rate controlled and continue on coreg.  -High risk for falls and therefore was not started on anticoagulation, but now she has a right upper extremity DVT, so she is going to be initiated on Eliquis for that.  8.Bipolar affective disorder, mixed - continue Lamictal  9. Right groin skin rash appears candidial-much improved -Continue Nystatin powder   10. Right upper extremity DVT- stop heparin drip until , initiate Eliquis loading dose at 10 mg twice daily for 7 days, then 5 mg twice daily    DISCHARGE CONDITIONS:   Stable  CONSULTS OBTAINED:  Treatment Team:  Yolonda Kida, MD Josefine Class, MD  DRUG ALLERGIES:   Allergies  Allergen Reactions  . Lactose Intolerance (Gi) Other (See Comments)    Reaction:  GI upset   . Lithium Other (See Comments)    Reaction:  Unknown   . Penicillins Rash    Patient tolerated Ampicillin-Sulbactam     DISCHARGE MEDICATIONS:   Current Discharge Medication List    START taking these medications   Details  !! apixaban (ELIQUIS) 5 MG TABS tablet Take 2 tablets (10 mg total) by mouth 2 (two) times daily. Qty:  28 tablet, Refills: 0    !! apixaban (ELIQUIS) 5 MG TABS tablet Take 1 tablet (5 mg total) by mouth 2 (two) times daily. Qty: 60 tablet, Refills: 3    carvedilol (COREG) 3.125 MG tablet Place 1 tablet (3.125 mg total) into feeding tube 2 (two) times daily with a meal. Qty: 60 tablet, Refills: 6    guaiFENesin-dextromethorphan (ROBITUSSIN DM) 100-10 MG/5ML syrup Place 5 mLs into feeding tube every 4 (four) hours as needed for cough. Qty: 118 mL, Refills: 0    insulin aspart (NOVOLOG) 100 UNIT/ML injection Inject 0-9 Units into the skin every 4 (four) hours. Qty: 10 mL, Refills: 11    Nutritional Supplements (FEEDING SUPPLEMENT, GLUCERNA 1.2 CAL,) LIQD  Place 1,000 mLs into feeding tube continuous. Qty: 1000 mL, Refills: 6    spironolactone (ALDACTONE) 25 MG tablet Take 1 tablet (25 mg total) by mouth daily. Qty: 30 tablet, Refills: 6    Water For Irrigation, Sterile (FREE WATER) SOLN Place 25 mLs into feeding tube every 4 (four) hours. Qty: 1000 mL, Refills: 6     !! - Potential duplicate medications found. Please discuss with provider.    CONTINUE these medications which have NOT CHANGED   Details  acetaminophen (TYLENOL) 650 MG CR tablet Take 650 mg by mouth daily. Pt takes daily at 2pm and every four hours as needed for pain.    albuterol (PROVENTIL HFA;VENTOLIN HFA) 108 (90 BASE) MCG/ACT inhaler Inhale 2 puffs into the lungs every 4 (four) hours as needed for wheezing or shortness of breath. Qty: 6.7 g, Refills: 1    bismuth subsalicylate (PEPTO BISMOL) 262 MG/15ML suspension Take 5 mLs by mouth 4 (four) times daily as needed for diarrhea or loose stools (or nausea).    busPIRone (BUSPAR) 10 MG tablet Take 10 mg by mouth 2 (two) times daily.     Dextromethorphan-Benzocaine (SORE THROAT & COUGH LOZENGES MT) Use as directed 1 lozenge in the mouth or throat 4 (four) times daily as needed (for cough).    diclofenac sodium (VOLTAREN) 1 % GEL Apply 2 g topically 2 (two) times daily.     donepezil (ARICEPT) 10 MG tablet Take 1 tablet (10 mg total) by mouth daily. Qty: 30 tablet, Refills: 3    Fluticasone-Salmeterol (ADVAIR) 250-50 MCG/DOSE AEPB Inhale 1 puff into the lungs 2 (two) times daily.    glimepiride (AMARYL) 1 MG tablet Take 1 tablet by mouth 2 (two) times daily.    guaifenesin (ROBITUSSIN) 100 MG/5ML syrup Take 100 mg by mouth 2 (two) times daily as needed for cough.    hydrALAZINE (APRESOLINE) 50 MG tablet Take 1 tablet (50 mg total) by mouth 3 (three) times daily. Qty: 90 tablet, Refills: 0    lamoTRIgine (LAMICTAL) 100 MG tablet Take 100 mg by mouth 2 (two) times daily.    latanoprost (XALATAN) 0.005 % ophthalmic  solution Place 1 drop into both eyes at bedtime.     nicotine (NICODERM CQ - DOSED IN MG/24 HR) 7 mg/24hr patch Place 1 patch (7 mg total) onto the skin daily. Qty: 15 patch, Refills: 0    ondansetron (ZOFRAN) 4 MG tablet Take 1 tablet (4 mg total) by mouth every 8 (eight) hours as needed for nausea or vomiting. Qty: 20 tablet, Refills: 0    pantoprazole (PROTONIX) 40 MG tablet Take 1 tablet (40 mg total) by mouth 2 (two) times daily before a meal. Qty: 60 tablet, Refills: 2    phenylephrine-shark liver oil-mineral oil-petrolatum (PREPARATION H) 0.25-3-14-71.9 %  rectal ointment Place 1 application rectally 2 (two) times daily as needed for hemorrhoids.    polyethylene glycol (MIRALAX / GLYCOLAX) packet Take 17 g by mouth daily as needed for mild constipation.    potassium chloride SA (K-DUR,KLOR-CON) 20 MEQ tablet Take 20 mEq by mouth daily.    predniSONE (DELTASONE) 10 MG tablet Take 30 mg by mouth every other day.    pyridostigmine (MESTINON) 60 MG tablet Take 60 mg by mouth 3 (three) times daily.     QUEtiapine (SEROQUEL) 200 MG tablet Take 1 tablet (200 mg total) by mouth at bedtime. Qty: 30 tablet, Refills: 3    sodium chloride (OCEAN) 0.65 % SOLN nasal spray Place 2 sprays into both nostrils daily as needed for congestion.     traMADol (ULTRAM) 50 MG tablet Take 50 mg by mouth 2 (two) times daily as needed.    traZODone (DESYREL) 50 MG tablet Take 1 tablet (50 mg total) by mouth at bedtime. Qty: 30 tablet, Refills: 0      STOP taking these medications     diltiazem (CARDIZEM CD) 180 MG 24 hr capsule      doxycycline (VIBRAMYCIN) 50 MG capsule      ferrous sulfate 325 (65 FE) MG tablet          DISCHARGE INSTRUCTIONS:    Patient is to follow-up with her primary care physician, primary neurology, cardiology , speech therapy, physical therapy, dietary   If you experience worsening of your admission symptoms, develop shortness of breath, life threatening emergency,  suicidal or homicidal thoughts you must seek medical attention immediately by calling 911 or calling your MD immediately  if symptoms less severe.  You Must read complete instructions/literature along with all the possible adverse reactions/side effects for all the Medicines you take and that have been prescribed to you. Take any new Medicines after you have completely understood and accept all the possible adverse reactions/side effects.   Please note  You were cared for by a hospitalist during your hospital stay. If you have any questions about your discharge medications or the care you received while you were in the hospital after you are discharged, you can call the unit and asked to speak with the hospitalist on call if the hospitalist that took care of you is not available. Once you are discharged, your primary care physician will handle any further medical issues. Please note that NO REFILLS for any discharge medications will be authorized once you are discharged, as it is imperative that you return to your primary care physician (or establish a relationship with a primary care physician if you do not have one) for your aftercare needs so that they can reassess your need for medications and monitor your lab values.    Today   CHIEF COMPLAINT:   Chief Complaint  Patient presents with  . Respiratory Distress    HISTORY OF PRESENT ILLNESS:  Mackenzie Rose  is a 69 y.o. female with a known history of A. fib, heart disease, hypertension, COPD who presents to the hospital with worsening shortness of breath as well as some chest pain. On arrival to emergency room, she was so short of breath that she required BiPAP. Her chest x-ray initially revealed CHF, but the repeated revealed aspiration pneumonitis as well. Patient was given IV Lasix and admitted to the hospital. On arrival to the hospital patient's creatinine level was noted to be elevated at 2.15. Patient deteriorated on 06/11/2015 and was  intubated. She was managed  on vent and extubated, however, deteriorated again and had to be reintubated. She was diuresed, watching her kidney function closely and overall improved. She was noted to have dysphagia symptoms and underwent modified barium swallowing study which revealed aspirations with all consistencies. It was felt that patient is not safe to eat, so gastroenterology consultation was obtained for PEG tube placement which was performed on 06/25/2015. Patient was initiated on tube feeds and tolerated tube feeds well. She was evaluated by dietary specialist who recommended Glucerna 1.2. Incidental Jevity 1.2-8 with blood glucose level control and recommended goal was 65 mL an hour. 3. Water flushes should be continued at 25 cc every 4 hours. Today,  19th of October 2016, patient is at 50 cc an hour of Glucerna 1.2, and tolerates this nutrition well except of mild abdominal pains, which was suspected due to PEG tube placement. Patient is being kept nothing by mouth . With this conservative therapy and therapy for aspiration pneumonitis patient's oxygen was weaned down to 2 L of oxygen through nasal cannula, most recent chest x-ray done on 06/25/2015 revealed interval improvement in basilar aeration. Because of generalized weakness and concern about myasthenia gravis exacerbation patient was initiated on IVIG by neurologist and continued for 3 days with some improvement of her overall strength. Patient was evaluated by physical therapist and recommended skilled nursing facility rehabilitation placement Discussion by problem 1. acute hypoxic respiratory failure secondary to Acute combined systolic, diastolic CHF (congestive heart failure) and Aspiration PNA, echocardiogram on this admission revealed moderately reduced left ventricular function with ejection fraction of 35-40%. Calcified mitral annulus with mild to moderate mitral regurgitation, diffuse hypokinesis.  -Patient has been intubated and then  extubated twice during the hospital course. Her respiratory status is tenuous given her high risk for aspiration. Clinically she does not appear to be in congestive heart failure presently. Chest x-ray revealed improvement in basilar aeration.  -She was diuresed with IV Lasix and responded well -Patient had a modified barium swallow which was high risk for aspiration. Seen by GI and had  PEG tube placed. 18th of October 2016. Continue to wean off oxygen as tolerated, now on 2 L  2. Acute on Chronic RF (acute renal failure) with known history of chronic renal failure stage III  -Creatinine 1.2 , October 13, 1.08 - 16th of October and at baseline. Continue to monitor -Renal ultrasound bilateral kidney cysts.  3. Myasthenia gravis - appreciate Neuro input , patient completed IVIG X 3 days and is relatively stable. At this point it is unclear if any improvement, although she appears to be more alert and active overall - cont. Pyridostigmine  4.HTN (hypertension)-stable  -continue coreg, hydralazine, spironolactone. The pressure is relatively well controlled  5.Type 2 diabetes mellitus-blood sugar stable and continue sliding scale insulin , may need to resume her home diabetic medications since she is on tube feeds at present, although her blood glucose level is ranging between 110-130 over the past 24-48 hours  6.COPD (chronic obstructive pulmonary disease) - continue duo nebs. Stable clinically  7.h/o chronic afib -Rate controlled and continue on coreg.  -High risk for falls and therefore was not started on anticoagulation, but now she has a right upper extremity DVT, so she is going to be initiated on Eliquis for that.  8.Bipolar affective disorder, mixed - continue Lamictal  9. Right groin skin rash appears candidial-much improved -Continue Nystatin powder   10. Right upper extremity DVT- stop heparin drip until , initiate Eliquis loading dose at  10 mg twice daily for 7 days, then 5  mg twice daily      VITAL SIGNS:  Blood pressure 151/62, pulse 68, temperature 98.9 F (37.2 C), temperature source Oral, resp. rate 20, height 5\' 4"  (1.626 m), weight 61.054 kg (134 lb 9.6 oz), SpO2 97 %.  I/O:    Intake/Output Summary (Last 24 hours) at 06/27/15 1354 Last data filed at 06/27/15 0940  Gross per 24 hour  Intake    862 ml  Output      0 ml  Net    862 ml    PHYSICAL EXAMINATION:  GENERAL:  69 y.o.-year-old patient lying in the bed with no acute distress.  EYES: Pupils equal, round, reactive to light and accommodation. No scleral icterus. Extraocular muscles intact.  HEENT: Head atraumatic, normocephalic. Oropharynx and nasopharynx clear.  NECK:  Supple, no jugular venous distention. No thyroid enlargement, no tenderness.  LUNGS: Normal breath sounds bilaterally, diminished anteriorly on the right at the base , no wheezing, rales,rhonchi or crepitation. No use of accessory muscles of respiration.  CARDIOVASCULAR: S1, S2 normal. No murmurs, rubs, or gallops.  ABDOMEN: Soft, non-tender, PEG tube is in mid abdomen non-distended. Bowel sounds present. No organomegaly or mass.  EXTREMITIES: No pedal edema, cyanosis, or clubbing.  NEUROLOGIC: Cranial nerves II through XII are intact. Muscle strength 5/5 in all extremities. Sensation intact. Gait not checked.  PSYCHIATRIC: The patient is alert and oriented x 3.  SKIN: No obvious rash, lesion, or ulcer.   DATA REVIEW:   CBC  Recent Labs Lab 06/27/15 0447  WBC 6.6  HGB 9.5*  HCT 30.6*  PLT 200    Chemistries   Recent Labs Lab 06/26/15 0434 06/27/15 0447  NA 141  --   K 3.5  --   CL 112*  --   CO2 26  --   GLUCOSE 105*  --   BUN 8  --   CREATININE 1.00  --   CALCIUM 8.5*  --   MG  --  1.7    Cardiac Enzymes No results for input(s): TROPONINI in the last 168 hours.  Microbiology Results  Results for orders placed or performed during the hospital encounter of 06/08/15  MRSA PCR Screening      Status: None   Collection Time: 06/09/15  5:26 AM  Result Value Ref Range Status   MRSA by PCR NEGATIVE NEGATIVE Final    Comment:        The GeneXpert MRSA Assay (FDA approved for NASAL specimens only), is one component of a comprehensive MRSA colonization surveillance program. It is not intended to diagnose MRSA infection nor to guide or monitor treatment for MRSA infections.   C difficile quick scan w PCR reflex     Status: None   Collection Time: 06/17/15  8:36 AM  Result Value Ref Range Status   C Diff antigen NEGATIVE NEGATIVE Final   C Diff toxin NEGATIVE NEGATIVE Final   C Diff interpretation Negative for C. difficile  Final    RADIOLOGY:  No results found.  EKG:   Orders placed or performed during the hospital encounter of 06/08/15  . EKG 12-Lead  . EKG 12-Lead  . ED EKG  . ED EKG      Management plans discussed with the patient, family and they are in agreement.  CODE STATUS:     Code Status Orders        Start     Ordered   06/21/15 1554  Do not attempt resuscitation (DNR)   Continuous    Question Answer Comment  In the event of cardiac or respiratory ARREST Do not call a "code blue"   In the event of cardiac or respiratory ARREST Do not perform Intubation, CPR, defibrillation or ACLS   In the event of cardiac or respiratory ARREST Use medication by any route, position, wound care, and other measures to relive pain and suffering. May use oxygen, suction and manual treatment of airway obstruction as needed for comfort.      06/21/15 1553    Advance Directive Documentation        Most Recent Value   Type of Advance Directive  Healthcare Power of Attorney   Pre-existing out of facility DNR order (yellow form or pink MOST form)     "MOST" Form in Place?        TOTAL TIME TAKING CARE OF THIS PATIENT: 45 minutes.    Theodoro Grist M.D on 06/27/2015 at 1:54 PM  Between 7am to 6pm - Pager - [phone]  After 6pm go to [email or web address] - password  [email or web address]  Office  [phone]  CC: Primary care physician; Donato Schultz, MD

## 2015-06-27 NOTE — Progress Notes (Signed)
NIF -[id number]

## 2015-06-27 NOTE — Plan of Care (Signed)
Problem: Discharge Progression Outcomes Goal: Other Discharge Outcomes/Goals Outcome: Progressing Pt supposed to be d/ced back to [facility] but PASSR # has expired and had to be resubmitted.  Pt tolerating inc in tube feeds.  Residual was 300 ml - ok w/dietician.  Pt still experiencing abdominal pain.  Gave morphine 1x.  Gave ativan 1x - pt has rested all afternoon. VSS.

## 2015-06-27 NOTE — Progress Notes (Signed)
ANTICOAGULATION CONSULT NOTE - Initial Consult  Pharmacy Consult for Eliquis Indication: DVT  Allergies  Allergen Reactions  . Lactose Intolerance (Gi) Other (See Comments)    Reaction:  GI upset   . Lithium Other (See Comments)    Reaction:  Unknown   . Penicillins Rash    Patient tolerated Ampicillin-Sulbactam     Patient Measurements: Height: 5\' 4"  (162.6 cm) Weight: 134 lb 9.6 oz (61.054 kg) IBW/kg (Calculated) : 54.7  Vital Signs: Temp: 99.1 F (37.3 C) (10/19 0429) Temp Source: Oral (10/19 0429) BP: 152/71 mmHg (10/19 0429) Pulse Rate: 86 (10/19 0429)  Labs:  Recent Labs  06/25/15 0453 06/26/15 0434 06/26/15 1054 06/27/15 0447  HGB 9.4* 9.7*  --  9.5*  HCT 30.1* 30.1*  --  30.6*  PLT 207 209  --  200  HEPARINUNFRC 0.73*  --  0.48 0.62  CREATININE  --  1.00  --   --     Estimated Creatinine Clearance: 44.6 mL/min (by C-G formula based on Cr of 1).   Medical History: Past Medical History  Diagnosis Date  . Heart disease   . Atrial fibrillation ([place])     a. reported a-fib; b. unknown chronicity; c. dates back to 1970s; d. not on long term anticoagulation 2/2 no documented a-fib  . Hypertension   . High cholesterol   . Diabetes ([place])     borderline  . COPD (chronic obstructive pulmonary disease) ([place])   . Melanoma ([place])   . Kidney disorder   . Tremor   . Autoimmune disease ([place])   . Depressed   . Anxiety disorder   . Migraine   . Dementia   . Motion sickness   . Myasthenia gravis ([place])   . Bipolar disorder ([place])   . Dementia   . Acute renal failure ([place])   . Near syncope     Medications:  Scheduled:  . apixaban  10 mg Oral BID  . [START ON 07/04/2015] apixaban  5 mg Oral BID  . carvedilol  3.125 mg Per Tube BID WC  . free water  25 mL Per Tube Q4H  . insulin aspart  0-9 Units Subcutaneous 6 times per day  . ipratropium-albuterol  3 mL Nebulization Q6H  . lamoTRIgine  100 mg Oral BID  . latanoprost  1 drop Both Eyes QHS  .  nitroGLYCERIN  1 inch Topical 3 times per day  . nystatin   Topical TID  . pantoprazole (PROTONIX) IV  40 mg Intravenous Q24H  . pyridostigmine  10 mg Intravenous TID  . sodium chloride  10 mL Intravenous Daily  . spironolactone  25 mg Oral Daily    Assessment: Pharmacy consulted to dose apixaban in a 69 yo female with DVT.  Patient previously on Heparin drip.  Heparin drip discontinued this AM at 0820.    SCr: 1.0, Wt: 61.1 kg, est CrCl~45 mL/min  Goal of Therapy:  Monitor CBC and SCr per policy   Plan:  Will order Eliquis 10 mg po BID based on renal function and indication (DVT treatment).  Dose of Eliquis to be given at time of discontinuation of Heparin drip.   Pharmacy will continue to follow.   Din Bookwalter G 06/27/2015,8:34 AM

## 2015-06-27 NOTE — Progress Notes (Signed)
Nutrition Follow-up   INTERVENTION:   EN: Pt tolearting Glucerna 1.2 at 57mL/hr this am, recommend titrating up to goal rate as ordered of 44mL/hr. RD notes pt likely d/c today. Recommend increasing free water flushes as discharge as pt's IVF will be discontinued.  Recommend free water of 150-24mL TID or 22mL/hr to total free water of 1830mL of free water.    NUTRITION DIAGNOSIS:   Inadequate oral intake related to acute illness as evidenced by NPO status, being addressed with EN s/p PEG placement  GOAL:   Patient will meet greater than or equal to 90% of their needs; ongoing  MONITOR:    (Energy intake, Pulmonary profile, Digestive system)  REASON FOR ASSESSMENT:   Consult Enteral/tube feeding initiation and management  ASSESSMENT:   Pt to be likely discharged today   Diet Order:  Diet NPO time specified   Current Nutrition: Pt tolerating Glucerna 1.2 at 73mL/hr this am with free water of 41mL q4 hours   Gastrointestinal Profile: residuals <20mL documented Last BM: 06/24/2015   Scheduled Medications:  . apixaban  10 mg Oral BID  . [START ON 07/04/2015] apixaban  5 mg Oral BID  . carvedilol  3.125 mg Per Tube BID WC  . free water  25 mL Per Tube Q4H  . insulin aspart  0-9 Units Subcutaneous 6 times per day  . ipratropium-albuterol  3 mL Nebulization Q6H  . lamoTRIgine  100 mg Oral BID  . latanoprost  1 drop Both Eyes QHS  . nitroGLYCERIN  1 inch Topical 3 times per day  . nystatin   Topical TID  . pantoprazole (PROTONIX) IV  40 mg Intravenous Q24H  . pyridostigmine  10 mg Intravenous TID  . sodium chloride  10 mL Intravenous Daily  . spironolactone  25 mg Oral Daily    Continuous Medications:  . dextrose 50 mL/hr at 06/26/15 2026  . feeding supplement (GLUCERNA 1.2 CAL) 1,000 mL (06/26/15 2356)     Electrolyte/Renal Profile and Glucose Profile:   Recent Labs Lab 06/22/15 0450 06/24/15 0500 06/26/15 0434 06/27/15 0447  NA 145 144 141  --   K 3.9  3.4* 3.5  --   CL 111 111 112*  --   CO2 29 28 26   --   BUN 32* 13 8  --   CREATININE 1.28* 1.08* 1.00  --   CALCIUM 8.6* 8.5* 8.5*  --   MG  --   --   --  1.7  PHOS  --   --   --  3.5  GLUCOSE 93 105* 105*  --    Protein Profile: No results for input(s): ALBUMIN in the last 168 hours.   Weight Trend since Admission: Filed Weights   06/24/15 2052 06/25/15 2213 06/27/15 0429  Weight: 137 lb 14.4 oz (62.551 kg) 137 lb 1.6 oz (62.188 kg) 134 lb 9.6 oz (61.054 kg)     Skin:   (stage II sacrum)   BMI:  Body mass index is 23.09 kg/(m^2).  Estimated Nutritional Needs:   Kcal:  1550-1831 kcals (BEE 1084, 1.3 AF, 1.1-1.3 IF)   Protein:  (1.2-2.0 g/kg) 78-130 g/d  Fluid:  25-38ml/kg) 1625-1939ml/d  EDUCATION NEEDS:   No education needs identified at this time   [place], RD, LDN Pager [phone]

## 2015-06-27 NOTE — Progress Notes (Signed)
CSW updated [facility] with possible dc today or tomorrow, they will be ready for Pt's return at DC.   CSW will continue to follow.  Toma Copier, [place]

## 2015-06-27 NOTE — Plan of Care (Signed)
Problem: Discharge Progression Outcomes Goal: Other Discharge Outcomes/Goals Outcome: Progressing Pt is alert to self, location. C/o abdominal pain, morphine given with some relief. Feeding increased to 50 ml/hr, tolerating well. D5 at 50 ml/hr running with heparin drip. VSS, continue to assess.

## 2015-06-27 NOTE — Care Management Important Message (Signed)
Important Message  Patient Details  Name: Mackenzie Rose MRN: [id number] Date of Birth: [birth year]   Medicare Important Message Given:  Geralyn Flash notification given    Darius Bump [place] 06/27/2015, 10:36 AM

## 2015-06-27 NOTE — Plan of Care (Signed)
Called report to [place]ica at [facility].  PASSR came through.  EMS called for transport.

## 2015-06-28 NOTE — Progress Notes (Signed)
CSW was notified that Pt was cleared to return to [facility] SNF. CSW was notified by SNF that Pt's pasrr had just expired while at [facility]. CSW updated pasrr and resubmitted for a 30 day note. CSW notified Pt's son of dc back to SNF. CSW contacted Pt's sister Perrin Smack with plan as well.   RN to call report and EMS for transportation to SNF.   No further CSW needs at this time.   Toma Copier, [place]

## 2015-08-07 ENCOUNTER — Inpatient Hospital Stay: Payer: Federal, State, Local not specified - PPO | Admitting: Oncology

## 2015-08-07 ENCOUNTER — Inpatient Hospital Stay: Payer: Federal, State, Local not specified - PPO

## 2015-08-07 ENCOUNTER — Inpatient Hospital Stay: Payer: Federal, State, Local not specified - PPO | Attending: Oncology

## 2015-08-09 DEATH — deceased

## 2017-01-10 IMAGING — CT CT ABD-PELV W/O CM
1 of 2 series · 13 of 32 positions shown, 17 images · non-contrast
Comparison: Abdominal ultrasound 12/08/2014

CLINICAL DATA: Diffuse primarily lower abdominal pain with GI bleed
and melena. Increased weakness.

EXAM:
CT ABDOMEN AND PELVIS WITHOUT CONTRAST
TECHNIQUE: Multidetector CT imaging of the abdomen and pelvis was performed
following the standard protocol without IV contrast.

[Series 2: routine abd pel without · axial · non-contrast · 0.70mm/px · z∈[-466,-106]mm · 13 of 83 slices shown, 17 images]
[im 7/83  soft-tissue]
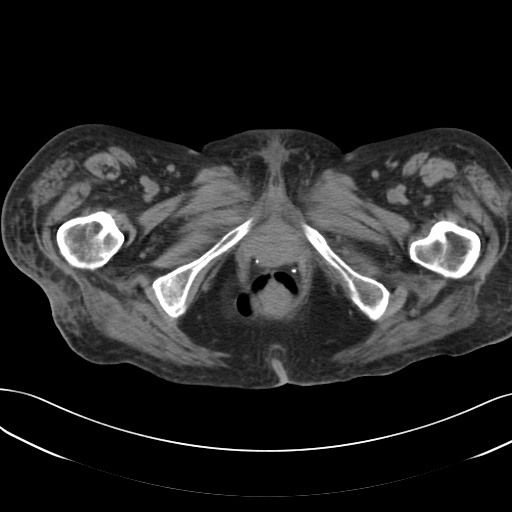
[im 7/83  bone]
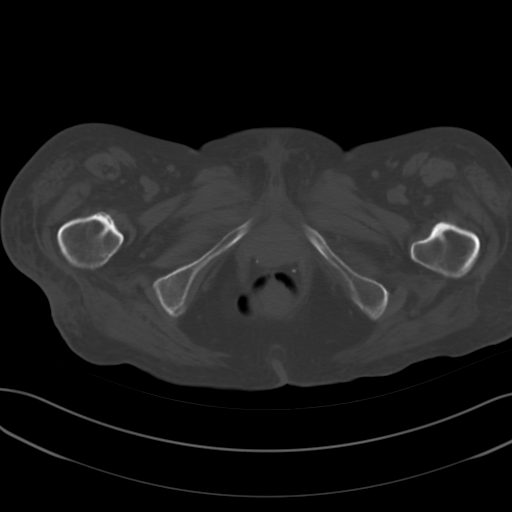
[im 14/83  soft-tissue]
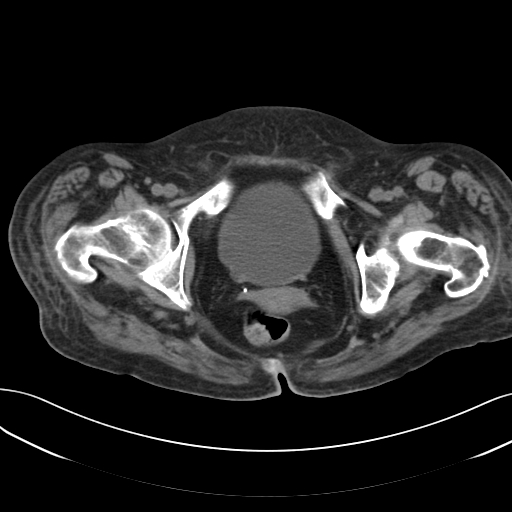
[im 20/83  soft-tissue]
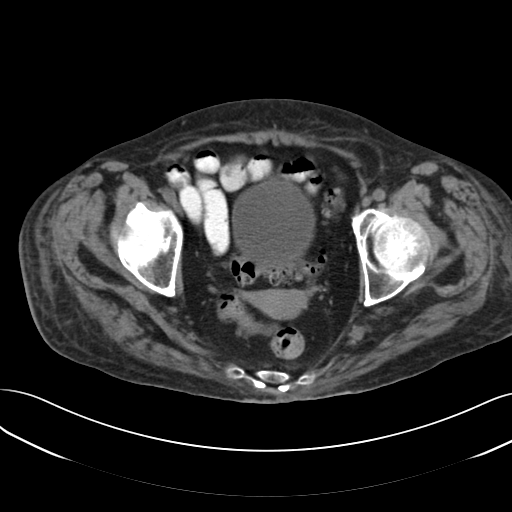
[im 27/83  soft-tissue]
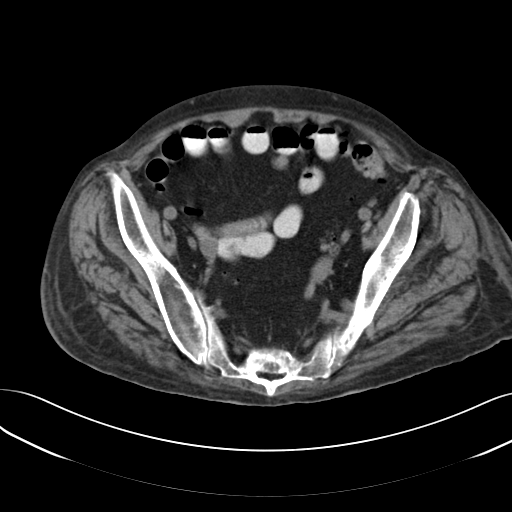
[im 33/83  soft-tissue]
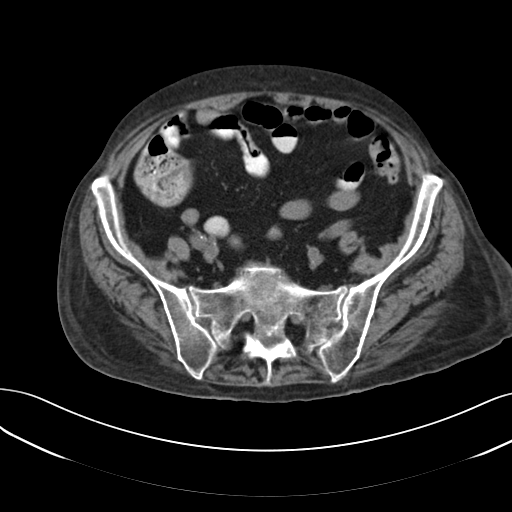
[im 43/83  soft-tissue]
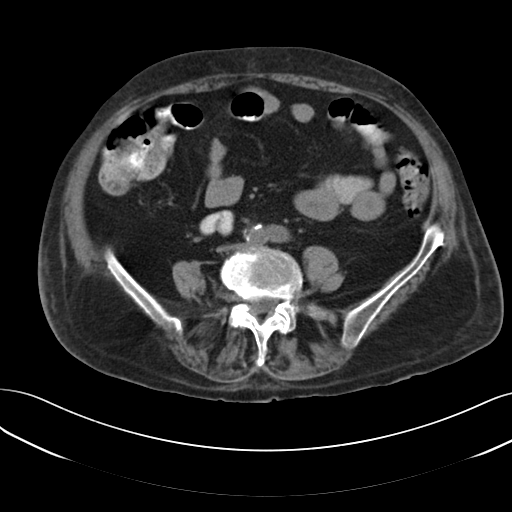
[im 50/83  soft-tissue]
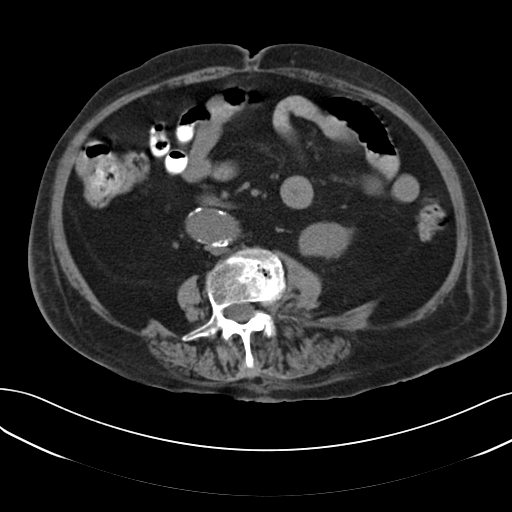
[im 56/83  soft-tissue]
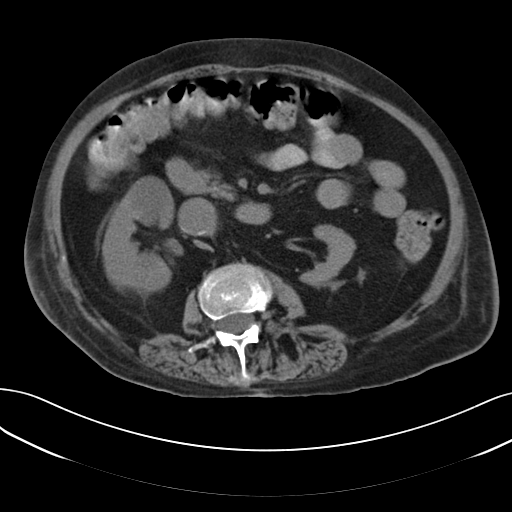
[im 63/83  soft-tissue]
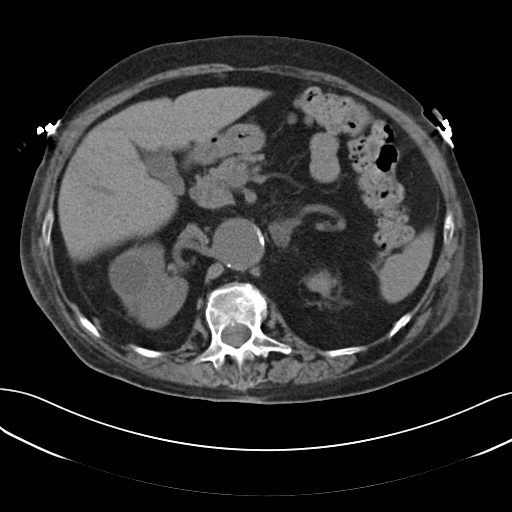
[im 63/83  bone]
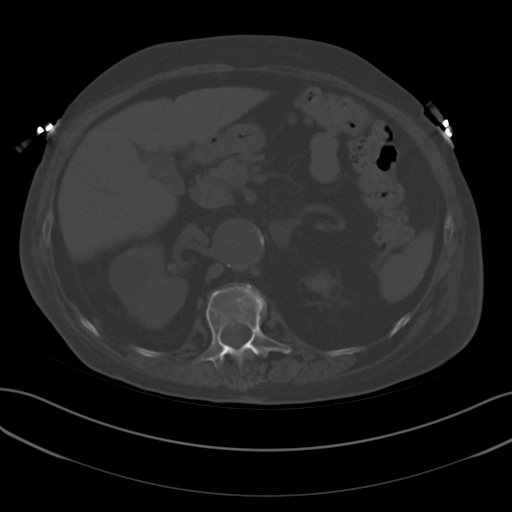
[im 69/83  soft-tissue]
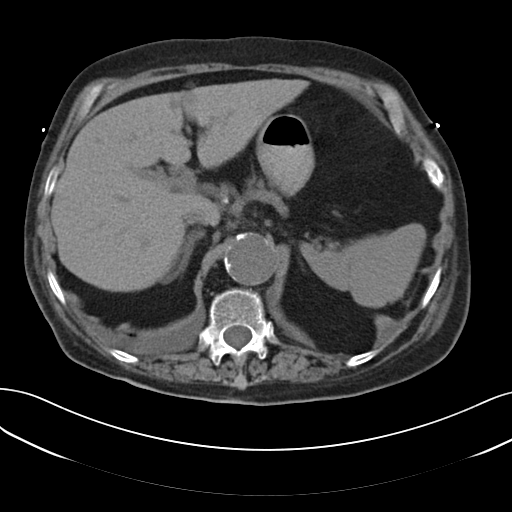
[im 69/83  lung]
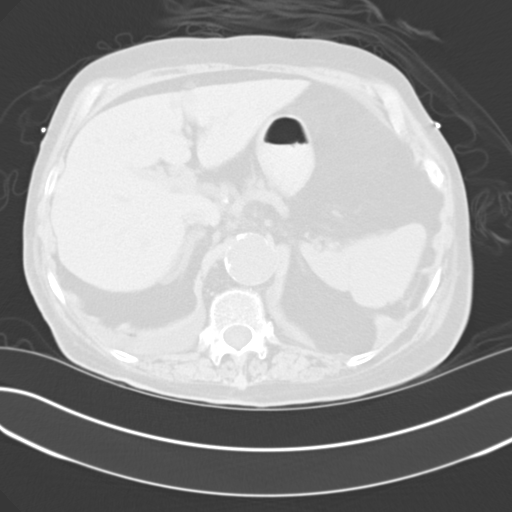
[im 73/83  lung]
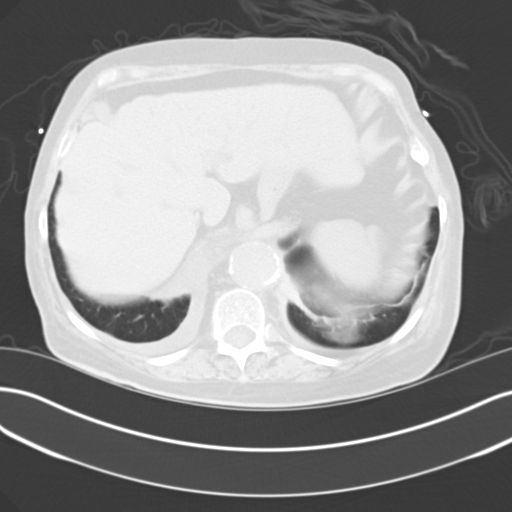
[im 76/83  soft-tissue]
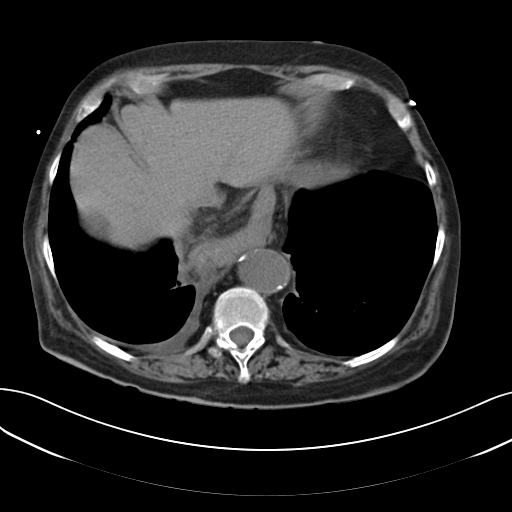
[im 76/83  lung]
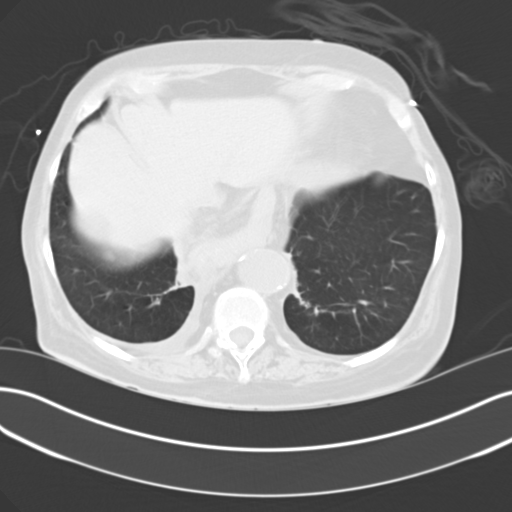
[im 79/83  lung]
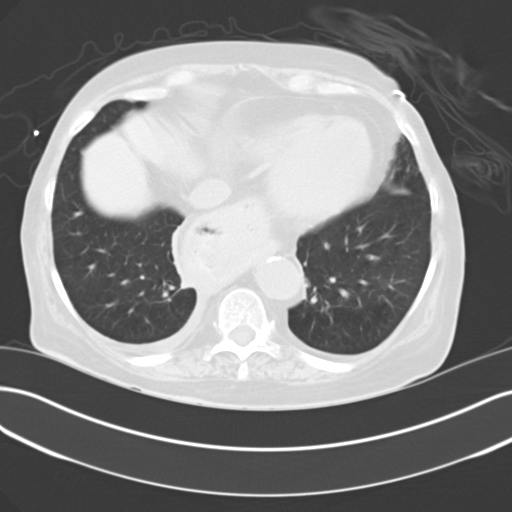

[13 of 32 positions shown; findings below may reference images not displayed]

FINDINGS: Lower chest: Small bilateral pleural effusions. Ill-defined
partially ground-glass opacity in the left lower lobe, only
partially included in indeterminate. Mild atelectasis in both lower
lobes.

Liver: Normal in size. Left lobe hepatic cyst measures 1 cm in the
region of the porta hepatis.

Hepatobiliary: Gallbladder is physiologically distended. The
calcifications in the gallbladder wall on prior ultrasound are not
well seen on CT. No calcified gallstones. No biliary dilatation.

Pancreas: No ductal dilatation or surrounding inflammation.

Spleen: Normal.

Adrenal glands: No nodule. Mild left adrenal thickening without
discrete nodule.

Kidneys: Atrophic left kidney with compensatory hypertrophy of the
right kidney. Lower pole cyst measures 2.8 cm in the right kidney,
the complexity and ultrasound is not well seen by CT. Cyst in the
upper pole measures 3.1 cm. This cyst in the left kidney on prior
ultrasound is not well seen. No hydronephrosis.

Stomach/Bowel: Moderately large hiatal hernia. Stomach
physiologically distended. There are no dilated or thickened small
bowel loops. No bowel dilatation. Diverticulosis throughout the
distal colon without diverticulitis. Small volume of stool
throughout the colon without colonic wall thickening. The appendix
is normal.

Vascular/Lymphatic: No retroperitoneal adenopathy. Fusiform
dilatation of the distal descending and suprarenal abdominal aorta,
maximal dimension 3.6 cm. Distal and mid aorta are tortuous, no
aneurysmal dilatation of the infrarenal abdominal aorta. There is
diffuse atheromatous calcifications. IVC appears narrowed.

Reproductive: Uterus is atrophic, normal for age. Ovaries are
quiescent, normal for age. No adnexal mass.

Bladder: Physiologically distended without wall thickening.

Other: No free air, free fluid, or intra-abdominal fluid collection.
Fat within both inguinal canals.

Musculoskeletal: There are no acute or suspicious osseous
abnormalities. Scoliotic curvature in the spine with associated
degenerative change. Compression deformities of L1 and L2 appear
chronic.
IMPRESSION: 1. Diverticulosis throughout the colon without diverticulitis.
2. Moderately large hiatal hernia.
3. Left renal atrophy with mild compensatory hypertrophy of the
right kidney. There are right renal cysts, the complexity on prior
ultrasound is not well appreciated. Cystic lesion in the left kidney
are not well appreciated. Recommend further characterization on a
nonemergent basis with MRI, with contrast if renal function permits.
4. Mild aneurysmal dilatation of the distal descending in suprarenal
abdominal aorta, maximal dimension 3.6 cm. Recommend followup by
ultrasound in 2 years. This recommendation follows ACR consensus
guidelines: White Paper of the ACR Incidental Findings Committee II
on Vascular Findings. [HOSPITAL] 4283; [DATE].
5. Ill-defined opacity in the left lower lobe, only partially
included in the field of view. This may be infectious or
inflammatory, however recommend nonemergent chest CT for further
characterization as this was not seen radiographically.

## 2017-01-28 IMAGING — CR DG CHEST 1V PORT
1 series · 1 of 1 positions shown · non-contrast
Comparison: 05/20/2015

CLINICAL DATA: Shortness of breath

EXAM:
PORTABLE CHEST 1 VIEW

[portable]
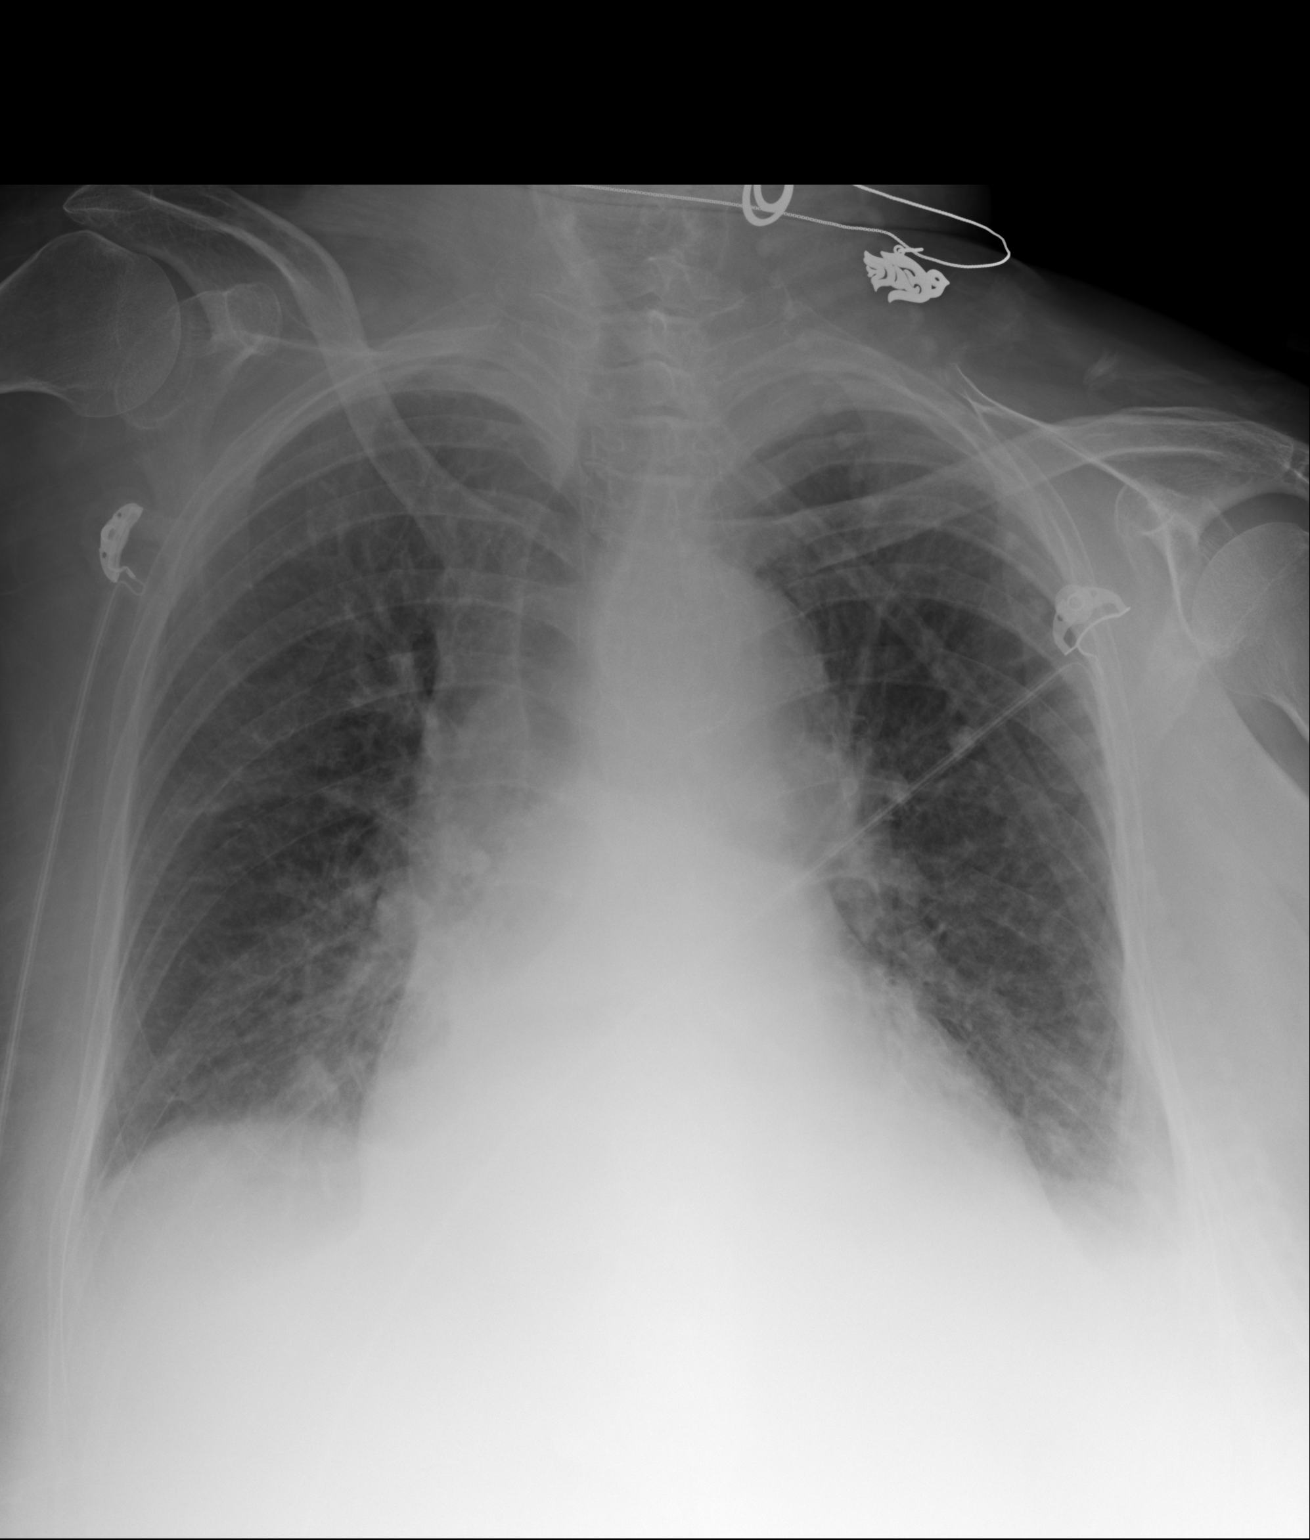

[1 of 1 positions shown; findings below may reference images not displayed]

FINDINGS: Chronic cardiopericardial enlargement. There is chronic vascular
pedicle widening and aortic tortuosity with superimposed hiatal
hernia. Trace left pleural effusion. Pulmonary venous congestion and
mild pulmonary edema. No air leak.
IMPRESSION: CHF pattern

## 2017-01-31 IMAGING — CR DG CHEST 1V PORT
1 series · 1 of 1 positions shown · non-contrast
Comparison: 06/08/2015 and 05/20/2015.

CLINICAL DATA: ICU patient. History of melanoma, diabetes and
hypertension. Myasthenia gravis.

EXAM:
PORTABLE CHEST 1 VIEW

[portable]
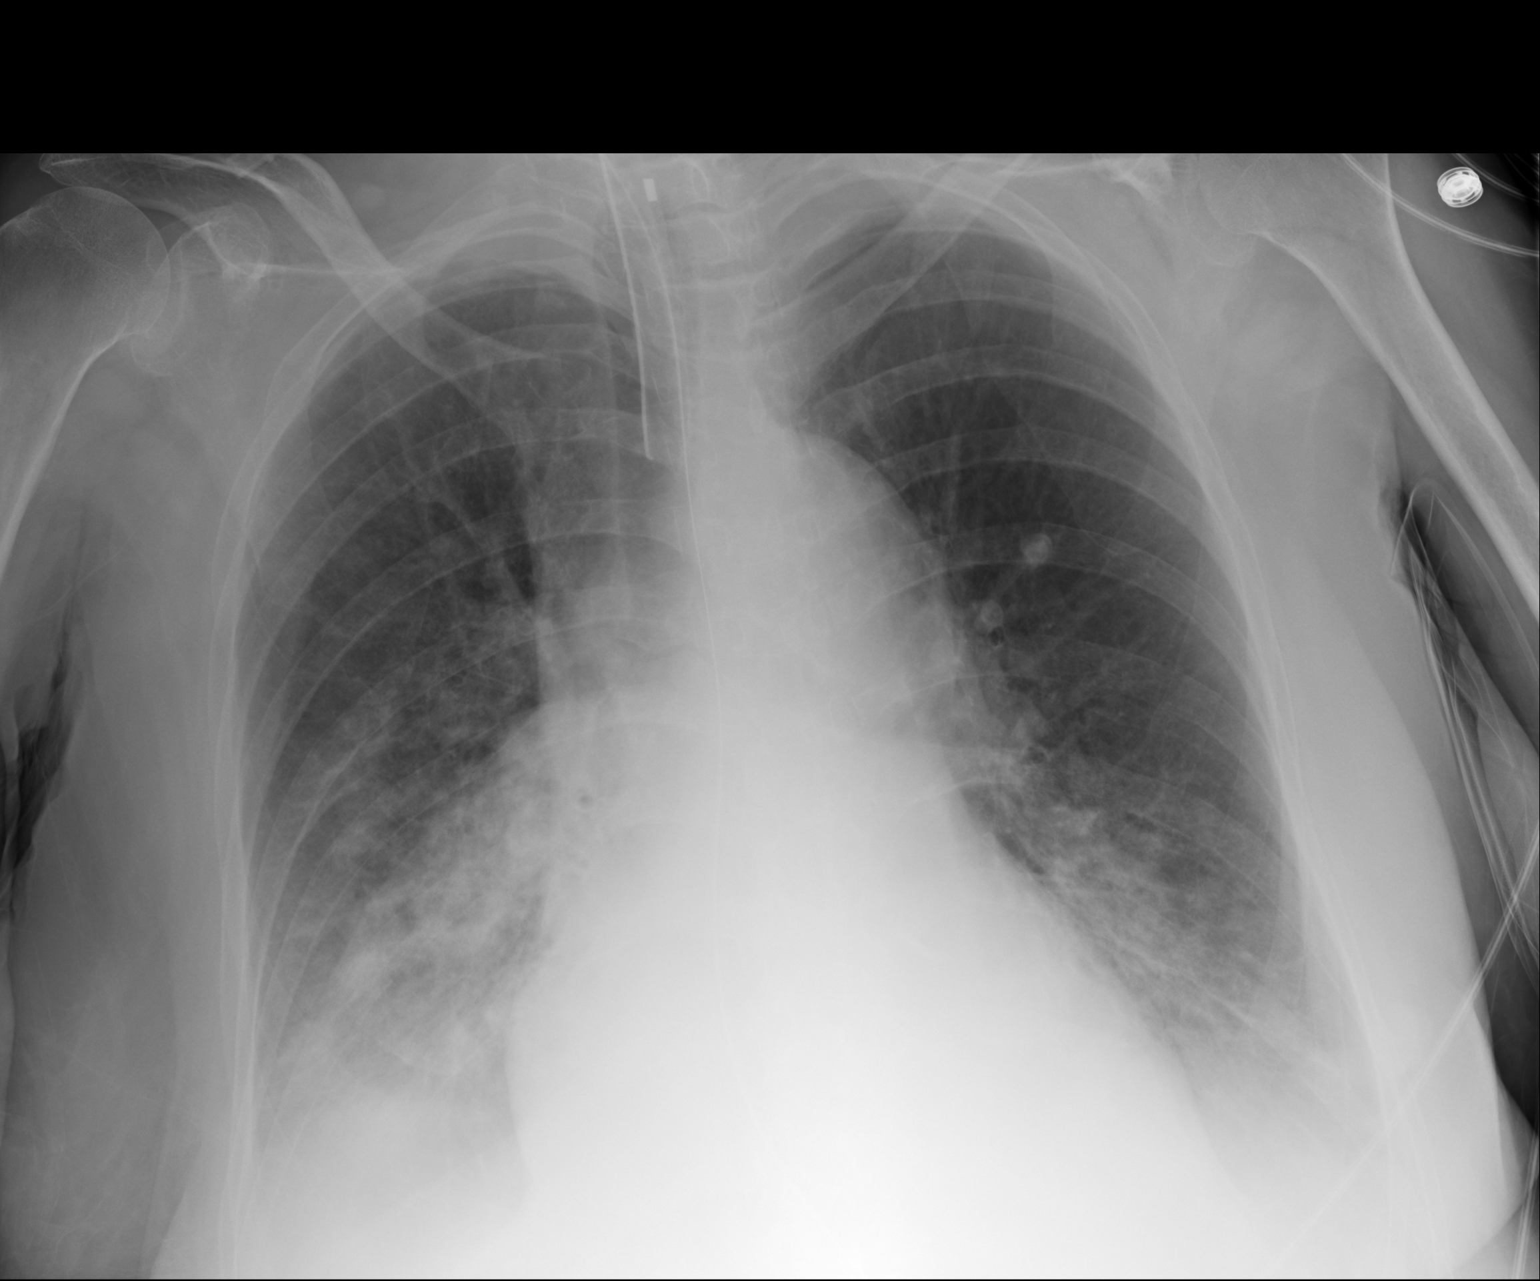

[1 of 1 positions shown; findings below may reference images not displayed]

FINDINGS: 4001 hours. Endotracheal tube tip is in the midtrachea. A
nasogastric tube projects below the diaphragm, tip not visualized.
There are worsening bibasilar airspace opacities with probable
bilateral pleural effusions, worrisome for aspiration pneumonia. The
pulmonary vasculature remain somewhat indistinct, although improved
from 3 days ago. There is no pneumothorax. The bones appear
unchanged.
IMPRESSION: 1. The endotracheal and nasogastric tubes appear in satisfactory
position.
2. Worsening bibasilar airspace opacities worrisome for aspiration
pneumonia
